# Patient Record
Sex: Female | Born: 1945 | Race: White | Hispanic: No | Marital: Married | Smoking: Never smoker
Health system: Southern US, Community
[De-identification: ages and names within clinical notes are randomized; demographics above are authoritative.]

## PROBLEM LIST (undated history)

## (undated) DIAGNOSIS — Z803 Family history of malignant neoplasm of breast: Secondary | ICD-10-CM

## (undated) DIAGNOSIS — Z808 Family history of malignant neoplasm of other organs or systems: Secondary | ICD-10-CM

## (undated) DIAGNOSIS — C801 Malignant (primary) neoplasm, unspecified: Secondary | ICD-10-CM

## (undated) DIAGNOSIS — I509 Heart failure, unspecified: Secondary | ICD-10-CM

## (undated) DIAGNOSIS — Z800 Family history of malignant neoplasm of digestive organs: Secondary | ICD-10-CM

## (undated) DIAGNOSIS — Z8719 Personal history of other diseases of the digestive system: Secondary | ICD-10-CM

## (undated) DIAGNOSIS — C50919 Malignant neoplasm of unspecified site of unspecified female breast: Secondary | ICD-10-CM

## (undated) DIAGNOSIS — Z923 Personal history of irradiation: Secondary | ICD-10-CM

## (undated) DIAGNOSIS — E039 Hypothyroidism, unspecified: Secondary | ICD-10-CM

## (undated) DIAGNOSIS — K219 Gastro-esophageal reflux disease without esophagitis: Secondary | ICD-10-CM

## (undated) DIAGNOSIS — Z806 Family history of leukemia: Secondary | ICD-10-CM

## (undated) DIAGNOSIS — Z8051 Family history of malignant neoplasm of kidney: Secondary | ICD-10-CM

## (undated) DIAGNOSIS — Z9221 Personal history of antineoplastic chemotherapy: Secondary | ICD-10-CM

## (undated) DIAGNOSIS — Z9889 Other specified postprocedural states: Secondary | ICD-10-CM

## (undated) DIAGNOSIS — K589 Irritable bowel syndrome without diarrhea: Secondary | ICD-10-CM

## (undated) DIAGNOSIS — E785 Hyperlipidemia, unspecified: Secondary | ICD-10-CM

## (undated) DIAGNOSIS — J45909 Unspecified asthma, uncomplicated: Secondary | ICD-10-CM

## (undated) HISTORY — DX: Family history of malignant neoplasm of kidney: Z80.51

## (undated) HISTORY — PX: BREAST SURGERY: SHX581

## (undated) HISTORY — DX: Malignant neoplasm of unspecified site of unspecified female breast: C50.919

## (undated) HISTORY — DX: Family history of malignant neoplasm of breast: Z80.3

## (undated) HISTORY — PX: ABDOMINAL HYSTERECTOMY: SHX81

## (undated) HISTORY — DX: Family history of leukemia: Z80.6

## (undated) HISTORY — DX: Irritable bowel syndrome, unspecified: K58.9

## (undated) HISTORY — PX: TONSILLECTOMY: SUR1361

## (undated) HISTORY — PX: DILATION AND CURETTAGE OF UTERUS: SHX78

## (undated) HISTORY — PX: KNEE ARTHROSCOPY: SUR90

## (undated) HISTORY — DX: Family history of malignant neoplasm of other organs or systems: Z80.8

## (undated) HISTORY — DX: Family history of malignant neoplasm of digestive organs: Z80.0

---

## 1998-03-27 ENCOUNTER — Ambulatory Visit (HOSPITAL_COMMUNITY): Admission: RE | Admit: 1998-03-27 | Discharge: 1998-03-27 | Payer: Self-pay | Admitting: Internal Medicine

## 1998-03-27 ENCOUNTER — Encounter: Payer: Self-pay | Admitting: Internal Medicine

## 1999-11-08 ENCOUNTER — Encounter: Admission: RE | Admit: 1999-11-08 | Discharge: 1999-11-08 | Payer: Self-pay | Admitting: Obstetrics and Gynecology

## 1999-11-08 ENCOUNTER — Encounter: Payer: Self-pay | Admitting: Obstetrics and Gynecology

## 1999-12-30 ENCOUNTER — Other Ambulatory Visit: Admission: RE | Admit: 1999-12-30 | Discharge: 1999-12-30 | Payer: Self-pay | Admitting: Obstetrics and Gynecology

## 2000-08-21 ENCOUNTER — Other Ambulatory Visit: Admission: RE | Admit: 2000-08-21 | Discharge: 2000-08-21 | Payer: Self-pay | Admitting: Obstetrics and Gynecology

## 2001-04-17 ENCOUNTER — Other Ambulatory Visit: Admission: RE | Admit: 2001-04-17 | Discharge: 2001-04-17 | Payer: Self-pay | Admitting: Obstetrics and Gynecology

## 2001-04-24 ENCOUNTER — Encounter: Payer: Self-pay | Admitting: Obstetrics and Gynecology

## 2001-04-24 ENCOUNTER — Encounter: Admission: RE | Admit: 2001-04-24 | Discharge: 2001-04-24 | Payer: Self-pay | Admitting: Obstetrics and Gynecology

## 2001-12-10 ENCOUNTER — Encounter: Admission: RE | Admit: 2001-12-10 | Discharge: 2001-12-10 | Payer: Self-pay | Admitting: Gynecology

## 2001-12-10 ENCOUNTER — Encounter: Payer: Self-pay | Admitting: Gynecology

## 2002-01-21 ENCOUNTER — Other Ambulatory Visit: Admission: RE | Admit: 2002-01-21 | Discharge: 2002-01-21 | Payer: Self-pay | Admitting: Gynecology

## 2002-04-29 ENCOUNTER — Other Ambulatory Visit: Admission: RE | Admit: 2002-04-29 | Discharge: 2002-04-29 | Payer: Self-pay | Admitting: Gynecology

## 2003-04-09 ENCOUNTER — Encounter: Payer: Self-pay | Admitting: Gastroenterology

## 2003-04-09 ENCOUNTER — Ambulatory Visit (HOSPITAL_COMMUNITY): Admission: RE | Admit: 2003-04-09 | Discharge: 2003-04-09 | Payer: Self-pay | Admitting: Gastroenterology

## 2003-08-11 ENCOUNTER — Other Ambulatory Visit: Admission: RE | Admit: 2003-08-11 | Discharge: 2003-08-11 | Payer: Self-pay | Admitting: Gynecology

## 2005-02-21 ENCOUNTER — Other Ambulatory Visit: Admission: RE | Admit: 2005-02-21 | Discharge: 2005-02-21 | Payer: Self-pay | Admitting: Gynecology

## 2006-05-16 ENCOUNTER — Other Ambulatory Visit: Admission: RE | Admit: 2006-05-16 | Discharge: 2006-05-16 | Payer: Self-pay | Admitting: Gynecology

## 2006-06-07 ENCOUNTER — Encounter: Admission: RE | Admit: 2006-06-07 | Discharge: 2006-06-07 | Payer: Self-pay | Admitting: Gynecology

## 2009-01-18 ENCOUNTER — Emergency Department (HOSPITAL_COMMUNITY): Admission: EM | Admit: 2009-01-18 | Discharge: 2009-01-18 | Payer: Self-pay | Admitting: Emergency Medicine

## 2010-01-26 ENCOUNTER — Ambulatory Visit (HOSPITAL_BASED_OUTPATIENT_CLINIC_OR_DEPARTMENT_OTHER): Admission: RE | Admit: 2010-01-26 | Discharge: 2010-01-26 | Payer: Self-pay | Admitting: Orthopaedic Surgery

## 2010-07-04 DIAGNOSIS — M1990 Unspecified osteoarthritis, unspecified site: Secondary | ICD-10-CM

## 2010-07-04 HISTORY — DX: Unspecified osteoarthritis, unspecified site: M19.90

## 2010-07-25 ENCOUNTER — Encounter: Payer: Self-pay | Admitting: Gynecology

## 2010-11-19 NOTE — Op Note (Signed)
NAME:  [name], [name] NO.:  [id number]   MEDICAL RECORD NO.:  [id number]                   PATIENT TYPE:  AMB   LOCATION:  ENDO                                 FACILITY:  [facility]   PHYSICIAN:  Danise Edge, M.D.                DATE OF BIRTH:  [birth year]   DATE OF PROCEDURE:  04/09/2003  DATE OF DISCHARGE:                                 OPERATIVE REPORT   PROCEDURE:  Incomplete colonoscopy.   INDICATIONS FOR PROCEDURE:  [name] is a 65 year old female born  11-Mar-1946. [name] probably has irritable bowel syndrome (diarrhea  predominant). Until she started taking hyoscyamine, she would experience  postprandial abdominal cramps bordering nonbloody diarrhea. Occasionally she  would see fresh blood on the toilet tissue after a bowel movement. Her  father has undergone colonoscopic exams to remove noncancerous colon polyps.   February 05, 2003 stool screen for C difficile toxin negative. Giardia-  crypto__________ screening negative. Ova and parasite screen negative. Fecal  leukocyte screen negative.   Since starting hyoscyamine, she has converted back to having her usual one  bowel movement daily.   CURRENT MEDICATIONS:  1. Nasacort.  2. Allegra.  3. __________.   PAST MEDICAL HISTORY:  1. Total abdominal hysterectomy.  2. Appendectomy.  3. Allergic rhinitis.   ALLERGIES:  PENICILLIN.   FAMILY HISTORY:  Father has undergone colonoscopic exams to remove  noncancerous colon polyps.   ENDOSCOPIST:  Danise Edge, M.D.   PREMEDICATION:  Versed 10 mg, Demerol 50 mg.   DESCRIPTION OF PROCEDURE:  After obtaining informed consent, Julie Thompson  was  placed in the left lateral decubitus position. I administered intravenous  Demerol and intravenous Versed to achieve conscious sedation for the  procedure. The patient's blood pressure, oxygen saturation and cardiac  rhythm were monitored throughout the procedure and documented in  the medical  record.   Anal inspection was normal. Digital rectal exam was normal. The Olympus  adult  colonoscope was introduced into the rectum and advanced to only 20 cm  from the anal verge. Due to colonic loop formation, I was unable to advance  the endoscope any further. I then switched to the pediatric adjustable  colonoscope and was able to advance the scope to 35 cm from the anal verge.  Due to colonic loop formation, I was unable to complete a colonoscopy.   Endoscopic appearance of the rectum and sigmoid colon with the endoscope  advanced to 35 cm from the anal verge was completely normal. There is no  endoscopic evidence for the presence of bleeding, inflammatory bowel disease  or colorectal neoplasia.   PLAN:  I am referring Julie Thompson to the radiology suite for an air contrast  barium enema.  Danise Edge, M.D.    MJ/MEDQ  D:  04/09/2003  T:  04/09/2003  Job:  [id number]   cc:   Theressa Millard, M.D.  [address] [place]  [place] [address]  Fax: [phone]

## 2011-03-01 ENCOUNTER — Other Ambulatory Visit: Payer: Self-pay | Admitting: Gynecology

## 2011-03-01 DIAGNOSIS — Z1231 Encounter for screening mammogram for malignant neoplasm of breast: Secondary | ICD-10-CM

## 2011-03-04 ENCOUNTER — Ambulatory Visit: Payer: Self-pay

## 2011-03-09 ENCOUNTER — Ambulatory Visit
Admission: RE | Admit: 2011-03-09 | Discharge: 2011-03-09 | Disposition: A | Discharge status: Home / Self Care | Payer: BC Managed Care – PPO | Source: Ambulatory Visit | Attending: Gynecology | Admitting: Gynecology

## 2011-03-09 DIAGNOSIS — Z1231 Encounter for screening mammogram for malignant neoplasm of breast: Secondary | ICD-10-CM

## 2013-09-02 ENCOUNTER — Other Ambulatory Visit: Payer: Self-pay | Admitting: Gastroenterology

## 2013-09-30 ENCOUNTER — Encounter (HOSPITAL_COMMUNITY): Payer: Self-pay | Admitting: Pharmacy Technician

## 2013-09-30 ENCOUNTER — Encounter (HOSPITAL_COMMUNITY): Payer: Self-pay | Admitting: *Deleted

## 2013-10-22 ENCOUNTER — Encounter (HOSPITAL_COMMUNITY): Payer: Self-pay | Admitting: *Deleted

## 2013-10-22 ENCOUNTER — Encounter (HOSPITAL_COMMUNITY)
Admission: RE | Disposition: A | Discharge status: Home / Self Care | Payer: Self-pay | Source: Ambulatory Visit | Attending: Gastroenterology

## 2013-10-22 ENCOUNTER — Ambulatory Visit (HOSPITAL_COMMUNITY)
Admission: RE | Admit: 2013-10-22 | Discharge: 2013-10-22 | Disposition: A | Discharge status: Home / Self Care | Payer: Medicare Other | Source: Ambulatory Visit | Attending: Gastroenterology | Admitting: Gastroenterology

## 2013-10-22 ENCOUNTER — Ambulatory Visit (HOSPITAL_COMMUNITY): Payer: Medicare Other | Admitting: Anesthesiology

## 2013-10-22 ENCOUNTER — Encounter (HOSPITAL_COMMUNITY): Payer: Medicare Other | Admitting: Anesthesiology

## 2013-10-22 DIAGNOSIS — N736 Female pelvic peritoneal adhesions (postinfective): Secondary | ICD-10-CM | POA: Insufficient documentation

## 2013-10-22 DIAGNOSIS — Z1211 Encounter for screening for malignant neoplasm of colon: Secondary | ICD-10-CM | POA: Insufficient documentation

## 2013-10-22 DIAGNOSIS — Z90710 Acquired absence of both cervix and uterus: Secondary | ICD-10-CM | POA: Insufficient documentation

## 2013-10-22 DIAGNOSIS — K5730 Diverticulosis of large intestine without perforation or abscess without bleeding: Secondary | ICD-10-CM | POA: Insufficient documentation

## 2013-10-22 HISTORY — DX: Unspecified asthma, uncomplicated: J45.909

## 2013-10-22 HISTORY — PX: FLEXIBLE SIGMOIDOSCOPY: SHX5431

## 2013-10-22 HISTORY — DX: Personal history of other diseases of the digestive system: Z87.19

## 2013-10-22 HISTORY — DX: Gastro-esophageal reflux disease without esophagitis: K21.9

## 2013-10-22 SURGERY — SIGMOIDOSCOPY, FLEXIBLE
Anesthesia: Monitor Anesthesia Care

## 2013-10-22 MED ORDER — PROPOFOL INFUSION 10 MG/ML OPTIME
INTRAVENOUS | Status: DC | PRN
  Administered 2013-10-22: 120 ug/kg/min via INTRAVENOUS

## 2013-10-22 MED ORDER — PROMETHAZINE HCL 25 MG/ML IJ SOLN: 6.2500 mg | INTRAMUSCULAR | Status: DC | PRN

## 2013-10-22 MED ORDER — PROPOFOL 10 MG/ML IV BOLUS
INTRAVENOUS | Status: DC | PRN
  Administered 2013-10-22: 50 mg via INTRAVENOUS

## 2013-10-22 MED ORDER — PROPOFOL 10 MG/ML IV BOLUS
INTRAVENOUS | Status: AC
  Filled 2013-10-22: qty 20

## 2013-10-22 MED ORDER — LIDOCAINE HCL (CARDIAC) 20 MG/ML IV SOLN
INTRAVENOUS | Status: DC | PRN
  Administered 2013-10-22: 50 mg via INTRAVENOUS

## 2013-10-22 MED ORDER — LIDOCAINE HCL (CARDIAC) 20 MG/ML IV SOLN
INTRAVENOUS | Status: AC
  Filled 2013-10-22: qty 5

## 2013-10-22 MED ORDER — LACTATED RINGERS IV SOLN
INTRAVENOUS | Status: DC
  Administered 2013-10-22: 1000 mL via INTRAVENOUS

## 2013-10-22 SURGICAL SUPPLY — 21 items

## 2013-10-22 NOTE — Discharge Instructions (Signed)
Flexible Sigmoidoscopy °Your caregiver has ordered a flexible sigmoidoscopy. This is an exam to evaluate your lower colon. In this exam your colon is cleansed and a short fiber optic tube is inserted through your rectum and into your colon. The fiber optic scope (endoscope) is a short bundle of enclosed flexible small glass fibers. It transmits light to the area examined and images from that area to your caregiver. You do not have to worry about glass breakage in the endoscope. Discomfort is usually minimal. Sedatives and pain medications are generally not required. This exam helps to detect tumors (lumps), polyps, inflammation (swelling and soreness), and areas of bleeding. It may also be used to take biopsies. These are small pieces of tissue taken to examine under a microscope. °LET YOUR CAREGIVER KNOW ABOUT: °· Allergies. °· Medications taken including herbs, eye drops, over the counter medications, and creams. °· Use of steroids (by mouth or creams). °· Previous problems with anesthetics or novocaine °· Possibility of pregnancy, if this applies. °· History of blood clots (thrombophlebitis). °· History of bleeding or blood problems. °· Previous surgery. °· Other health problems. °BEFORE THE PROCEDURE °Eat normally the night before the exam. Your caregiver may order a mild enema or laxative the night before. No eating or drinking should occur after midnight until the procedure is completed. A rectal suppository or enemas may be given in the morning prior to your procedure. You will be brought to the examination area in a hospital gown. °You should be present 60 minutes prior to your procedure or as directed.  °AFTER THE PROCEDURE  °· There is sometimes a little blood passed with the first bowel movement. Do not be concerned. Because air is often used during the exam, it is not unusual to pass gas and experience abdominal (belly) cramping. Walking or a warm pack on your abdomen may help with this. Do not sleep  with a heating pad as burns can occur. °· You may resume all normal eating and activities. °· Only take over-the-counter or prescription medicines for pain, discomfort, or fever as directed by your caregiver. Do not use aspirin or blood thinners if a biopsy (tissue sample) was taken. Consult your caregiver for medication usage if biopsies were taken. °· Call for your results as instructed by your caregiver. Remember, it is your responsibility to obtain the results of your biopsy. Do not assume everything is fine because you do not hear from your caregiver. °SEEK IMMEDIATE MEDICAL CARE IF: °· An oral temperature above 102° F (38.9° C) develops. °· You pass large blood clots or fill a toilet with blood following the procedure. This may also occur 10 to 14 days following the procedure. It is more likely if a biopsy was taken. °· You develop abdominal pain not relieved with medication or that is getting worse rather than better. °Document Released: 06/17/2000 Document Revised: 09/12/2011 Document Reviewed: 03/30/2005 °[employer]® Patient Information ©2014 [employer]. ° °

## 2013-10-22 NOTE — Op Note (Signed)
Procedure: Screening flexible proctosigmoidoscopy. A screening colonoscopy could not be performed due to pelvic adhesions from previous surgery resulting in a poorly mobile sigmoid colon.  Endoscopist: Earle Gell  Premedication: Propofol administered by anesthesia  Procedure: The patient was placed in the left lateral decubitus position. Anal inspection and digital rectal exam were normal. The Pentax pediatric colonoscope was introduced into the rectum and advanced to approximately 30 cm from the anal verge. I could not advance the colonoscope proximal due to restricted mobility of the sigmoid colon from previous surgery. Pediatric colonoscope was removed and the ultrathin colonoscope was introduced into the rectum and advanced to 30 cm from the anal verge. I was unable to advance the ultrathin colonoscope proximally due to restricted mobility of the sigmoid colon from previous surgery. Colonic preparation for the exam today was good.  Rectum. Normal. Retroflexed view of the distal rectum normal  Sigmoid colon. Extensive colonic diverticulosis  Assessment:   #1. A screening colonoscopy could not be performed using the pediatric colonoscope and ultrathin colonoscope secondary to restricted sigmoid colon mobility from previous surgery  #2. Extensive left colonic diverticulosis  Recommendations: I recommend scheduling the patient for a screening virtual colonoscopy.

## 2013-10-22 NOTE — H&P (Signed)
  Procedure: Screening colonoscopy. 04/09/2003 normal screening flexible proctosigmoidoscopy and air-contrast barium enema  History: The patient is a 68 year old female born 1945/07/11. She is scheduled to undergo a screening colonoscopy with polypectomy to prevent colon cancer.  Past medical history: Endometriosis. Hysterectomy. Arthroscopic knee surgery.  Allergies: Penicillin and sulfa drugs  Exam: The patient is alert and lying comfortably on the endoscopy stretcher. Lungs are clear to auscultation. Cardiac exam reveals a regular rhythm. Abdomen is soft and nontender to palpation.   Plan: Proceed with screening colonoscopy

## 2013-10-22 NOTE — Anesthesia Postprocedure Evaluation (Signed)
  Anesthesia Post-op Note  Patient: Julie Thompson  Procedure(s) Performed: Procedure(s) (LRB): FLEXIBLE SIGMOIDOSCOPY (N/A)  Patient Location: PACU  Anesthesia Type: MAC  Level of Consciousness: awake and alert   Airway and Oxygen Therapy: Patient Spontanous Breathing  Post-op Pain: mild  Post-op Assessment: Post-op Vital signs reviewed, Patient's Cardiovascular Status Stable, Respiratory Function Stable, Patent Airway and No signs of Nausea or vomiting  Last Vitals:  Filed Vitals:   10/22/13 1105  BP: 138/83  Temp:   Resp: 23    Post-op Vital Signs: stable   Complications: No apparent anesthesia complications

## 2013-10-22 NOTE — Anesthesia Preprocedure Evaluation (Signed)
Anesthesia Evaluation  Patient identified by MRN, date of birth, ID band Patient awake    Reviewed: Allergy & Precautions, H&P , NPO status , Patient's Chart, lab work & pertinent test results  Airway Mallampati: II TM Distance: >3 FB Neck ROM: Full    Dental no notable dental hx.    Pulmonary asthma ,  breath sounds clear to auscultation  Pulmonary exam normal       Cardiovascular negative cardio ROS  Rhythm:Regular Rate:Normal     Neuro/Psych negative neurological ROS  negative psych ROS   GI/Hepatic negative GI ROS, Neg liver ROS,   Endo/Other  negative endocrine ROS  Renal/GU negative Renal ROS  negative genitourinary   Musculoskeletal negative musculoskeletal ROS (+)   Abdominal   Peds negative pediatric ROS (+)  Hematology negative hematology ROS (+)   Anesthesia Other Findings   Reproductive/Obstetrics negative OB ROS                           Anesthesia Physical Anesthesia Plan  ASA: II  Anesthesia Plan: MAC   Post-op Pain Management:    Induction: Intravenous  Airway Management Planned: Nasal Cannula  Additional Equipment:   Intra-op Plan:   Post-operative Plan:   Informed Consent: I have reviewed the patients History and Physical, chart, labs and discussed the procedure including the risks, benefits and alternatives for the proposed anesthesia with the patient or authorized representative who has indicated his/her understanding and acceptance.   Dental advisory given  Plan Discussed with: CRNA and Surgeon  Anesthesia Plan Comments:         Anesthesia Quick Evaluation

## 2013-10-22 NOTE — Transfer of Care (Signed)
Immediate Anesthesia Transfer of Care Note  Patient: Julie Thompson  Procedure(s) Performed: Procedure(s): FLEXIBLE SIGMOIDOSCOPY (N/A)  Patient Location: PACU  Anesthesia Type:MAC  Level of Consciousness: awake, alert  and oriented  Airway & Oxygen Therapy: Patient Spontanous Breathing and Patient connected to face mask oxygen  Post-op Assessment: Report given to PACU RN and Post -op Vital signs reviewed and stable  Post vital signs: Reviewed and stable  Complications: No apparent anesthesia complications

## 2013-10-23 ENCOUNTER — Encounter (HOSPITAL_COMMUNITY): Payer: Self-pay | Admitting: Gastroenterology

## 2013-10-29 ENCOUNTER — Other Ambulatory Visit: Payer: Self-pay | Admitting: Gastroenterology

## 2013-10-29 DIAGNOSIS — Q438 Other specified congenital malformations of intestine: Secondary | ICD-10-CM

## 2013-11-11 ENCOUNTER — Other Ambulatory Visit: Payer: Medicare Other

## 2013-11-15 ENCOUNTER — Ambulatory Visit
Admission: RE | Admit: 2013-11-15 | Discharge: 2013-11-15 | Disposition: A | Discharge status: Home / Self Care | Payer: Medicare Other | Source: Ambulatory Visit | Attending: Gastroenterology | Admitting: Gastroenterology

## 2013-11-15 DIAGNOSIS — Q438 Other specified congenital malformations of intestine: Secondary | ICD-10-CM

## 2014-02-18 ENCOUNTER — Other Ambulatory Visit: Payer: Self-pay

## 2014-02-18 DIAGNOSIS — Z1231 Encounter for screening mammogram for malignant neoplasm of breast: Secondary | ICD-10-CM

## 2014-02-24 ENCOUNTER — Ambulatory Visit: Payer: Medicare Other

## 2014-03-03 ENCOUNTER — Ambulatory Visit
Admission: RE | Admit: 2014-03-03 | Discharge: 2014-03-03 | Disposition: A | Discharge status: Home / Self Care | Payer: Medicare Other | Source: Ambulatory Visit

## 2014-03-03 DIAGNOSIS — Z1231 Encounter for screening mammogram for malignant neoplasm of breast: Secondary | ICD-10-CM

## 2015-10-09 DIAGNOSIS — H25099 Other age-related incipient cataract, unspecified eye: Secondary | ICD-10-CM | POA: Diagnosis not present

## 2015-10-21 DIAGNOSIS — M1712 Unilateral primary osteoarthritis, left knee: Secondary | ICD-10-CM | POA: Diagnosis not present

## 2015-10-21 DIAGNOSIS — M1711 Unilateral primary osteoarthritis, right knee: Secondary | ICD-10-CM | POA: Diagnosis not present

## 2015-11-20 DIAGNOSIS — M1712 Unilateral primary osteoarthritis, left knee: Secondary | ICD-10-CM | POA: Diagnosis not present

## 2015-11-20 DIAGNOSIS — M1711 Unilateral primary osteoarthritis, right knee: Secondary | ICD-10-CM | POA: Diagnosis not present

## 2015-11-27 DIAGNOSIS — M1712 Unilateral primary osteoarthritis, left knee: Secondary | ICD-10-CM | POA: Diagnosis not present

## 2015-11-27 DIAGNOSIS — M1711 Unilateral primary osteoarthritis, right knee: Secondary | ICD-10-CM | POA: Diagnosis not present

## 2015-12-04 DIAGNOSIS — M1712 Unilateral primary osteoarthritis, left knee: Secondary | ICD-10-CM | POA: Diagnosis not present

## 2015-12-04 DIAGNOSIS — M1711 Unilateral primary osteoarthritis, right knee: Secondary | ICD-10-CM | POA: Diagnosis not present

## 2015-12-11 DIAGNOSIS — M1711 Unilateral primary osteoarthritis, right knee: Secondary | ICD-10-CM | POA: Diagnosis not present

## 2015-12-11 DIAGNOSIS — M1712 Unilateral primary osteoarthritis, left knee: Secondary | ICD-10-CM | POA: Diagnosis not present

## 2015-12-18 DIAGNOSIS — M1712 Unilateral primary osteoarthritis, left knee: Secondary | ICD-10-CM | POA: Diagnosis not present

## 2015-12-18 DIAGNOSIS — M1711 Unilateral primary osteoarthritis, right knee: Secondary | ICD-10-CM | POA: Diagnosis not present

## 2016-01-18 DIAGNOSIS — M1711 Unilateral primary osteoarthritis, right knee: Secondary | ICD-10-CM | POA: Diagnosis not present

## 2016-02-17 DIAGNOSIS — M1711 Unilateral primary osteoarthritis, right knee: Secondary | ICD-10-CM | POA: Diagnosis not present

## 2016-02-17 DIAGNOSIS — M1712 Unilateral primary osteoarthritis, left knee: Secondary | ICD-10-CM | POA: Diagnosis not present

## 2016-03-02 DIAGNOSIS — Z1239 Encounter for other screening for malignant neoplasm of breast: Secondary | ICD-10-CM | POA: Diagnosis not present

## 2016-03-02 DIAGNOSIS — Z6838 Body mass index (BMI) 38.0-38.9, adult: Secondary | ICD-10-CM | POA: Diagnosis not present

## 2016-03-02 DIAGNOSIS — K210 Gastro-esophageal reflux disease with esophagitis: Secondary | ICD-10-CM | POA: Diagnosis not present

## 2016-03-02 DIAGNOSIS — E785 Hyperlipidemia, unspecified: Secondary | ICD-10-CM | POA: Diagnosis not present

## 2016-03-02 DIAGNOSIS — K589 Irritable bowel syndrome without diarrhea: Secondary | ICD-10-CM | POA: Diagnosis not present

## 2016-03-02 DIAGNOSIS — R635 Abnormal weight gain: Secondary | ICD-10-CM | POA: Diagnosis not present

## 2016-03-02 DIAGNOSIS — Z1389 Encounter for screening for other disorder: Secondary | ICD-10-CM | POA: Diagnosis not present

## 2016-03-02 DIAGNOSIS — Z1211 Encounter for screening for malignant neoplasm of colon: Secondary | ICD-10-CM | POA: Diagnosis not present

## 2016-03-02 DIAGNOSIS — N3941 Urge incontinence: Secondary | ICD-10-CM | POA: Diagnosis not present

## 2016-03-02 DIAGNOSIS — Z0000 Encounter for general adult medical examination without abnormal findings: Secondary | ICD-10-CM | POA: Diagnosis not present

## 2016-05-03 DIAGNOSIS — Z23 Encounter for immunization: Secondary | ICD-10-CM | POA: Diagnosis not present

## 2016-05-03 DIAGNOSIS — E7800 Pure hypercholesterolemia, unspecified: Secondary | ICD-10-CM | POA: Diagnosis not present

## 2016-05-03 DIAGNOSIS — E039 Hypothyroidism, unspecified: Secondary | ICD-10-CM | POA: Diagnosis not present

## 2016-08-20 DIAGNOSIS — M1711 Unilateral primary osteoarthritis, right knee: Secondary | ICD-10-CM | POA: Diagnosis not present

## 2016-08-20 DIAGNOSIS — M1712 Unilateral primary osteoarthritis, left knee: Secondary | ICD-10-CM | POA: Diagnosis not present

## 2016-08-20 DIAGNOSIS — S76312A Strain of muscle, fascia and tendon of the posterior muscle group at thigh level, left thigh, initial encounter: Secondary | ICD-10-CM | POA: Diagnosis not present

## 2016-08-29 DIAGNOSIS — Z780 Asymptomatic menopausal state: Secondary | ICD-10-CM | POA: Diagnosis not present

## 2016-08-31 DIAGNOSIS — E039 Hypothyroidism, unspecified: Secondary | ICD-10-CM | POA: Diagnosis not present

## 2016-08-31 DIAGNOSIS — M1712 Unilateral primary osteoarthritis, left knee: Secondary | ICD-10-CM | POA: Diagnosis not present

## 2016-08-31 DIAGNOSIS — M1711 Unilateral primary osteoarthritis, right knee: Secondary | ICD-10-CM | POA: Diagnosis not present

## 2016-08-31 DIAGNOSIS — E7800 Pure hypercholesterolemia, unspecified: Secondary | ICD-10-CM | POA: Diagnosis not present

## 2016-08-31 DIAGNOSIS — M170 Bilateral primary osteoarthritis of knee: Secondary | ICD-10-CM | POA: Diagnosis not present

## 2016-09-07 DIAGNOSIS — M1712 Unilateral primary osteoarthritis, left knee: Secondary | ICD-10-CM | POA: Diagnosis not present

## 2016-09-07 DIAGNOSIS — M1711 Unilateral primary osteoarthritis, right knee: Secondary | ICD-10-CM | POA: Diagnosis not present

## 2016-09-14 DIAGNOSIS — M1712 Unilateral primary osteoarthritis, left knee: Secondary | ICD-10-CM | POA: Diagnosis not present

## 2016-09-14 DIAGNOSIS — M1711 Unilateral primary osteoarthritis, right knee: Secondary | ICD-10-CM | POA: Diagnosis not present

## 2016-09-21 DIAGNOSIS — M1712 Unilateral primary osteoarthritis, left knee: Secondary | ICD-10-CM | POA: Diagnosis not present

## 2016-09-21 DIAGNOSIS — M1711 Unilateral primary osteoarthritis, right knee: Secondary | ICD-10-CM | POA: Diagnosis not present

## 2016-09-28 DIAGNOSIS — M1711 Unilateral primary osteoarthritis, right knee: Secondary | ICD-10-CM | POA: Diagnosis not present

## 2016-09-28 DIAGNOSIS — M1712 Unilateral primary osteoarthritis, left knee: Secondary | ICD-10-CM | POA: Diagnosis not present

## 2016-10-26 DIAGNOSIS — M170 Bilateral primary osteoarthritis of knee: Secondary | ICD-10-CM | POA: Diagnosis not present

## 2016-12-13 ENCOUNTER — Other Ambulatory Visit: Payer: Self-pay | Admitting: Internal Medicine

## 2016-12-13 DIAGNOSIS — Z1231 Encounter for screening mammogram for malignant neoplasm of breast: Secondary | ICD-10-CM

## 2016-12-28 ENCOUNTER — Ambulatory Visit
Admission: RE | Admit: 2016-12-28 | Discharge: 2016-12-28 | Disposition: A | Discharge status: Home / Self Care | Payer: Medicare Other | Source: Ambulatory Visit | Attending: Internal Medicine | Admitting: Internal Medicine

## 2016-12-28 DIAGNOSIS — Z1231 Encounter for screening mammogram for malignant neoplasm of breast: Secondary | ICD-10-CM

## 2017-03-03 DIAGNOSIS — E039 Hypothyroidism, unspecified: Secondary | ICD-10-CM | POA: Diagnosis not present

## 2017-03-03 DIAGNOSIS — Z0000 Encounter for general adult medical examination without abnormal findings: Secondary | ICD-10-CM | POA: Diagnosis not present

## 2017-03-03 DIAGNOSIS — E7800 Pure hypercholesterolemia, unspecified: Secondary | ICD-10-CM | POA: Diagnosis not present

## 2017-03-03 DIAGNOSIS — M170 Bilateral primary osteoarthritis of knee: Secondary | ICD-10-CM | POA: Diagnosis not present

## 2017-03-27 DIAGNOSIS — M1712 Unilateral primary osteoarthritis, left knee: Secondary | ICD-10-CM | POA: Diagnosis not present

## 2017-03-27 DIAGNOSIS — M1711 Unilateral primary osteoarthritis, right knee: Secondary | ICD-10-CM | POA: Diagnosis not present

## 2017-04-10 DIAGNOSIS — M1711 Unilateral primary osteoarthritis, right knee: Secondary | ICD-10-CM | POA: Diagnosis not present

## 2017-04-10 DIAGNOSIS — M1712 Unilateral primary osteoarthritis, left knee: Secondary | ICD-10-CM | POA: Diagnosis not present

## 2017-04-12 DIAGNOSIS — Z23 Encounter for immunization: Secondary | ICD-10-CM | POA: Diagnosis not present

## 2017-04-19 DIAGNOSIS — M1711 Unilateral primary osteoarthritis, right knee: Secondary | ICD-10-CM | POA: Diagnosis not present

## 2017-04-19 DIAGNOSIS — M1712 Unilateral primary osteoarthritis, left knee: Secondary | ICD-10-CM | POA: Diagnosis not present

## 2017-04-26 DIAGNOSIS — M1712 Unilateral primary osteoarthritis, left knee: Secondary | ICD-10-CM | POA: Diagnosis not present

## 2017-04-26 DIAGNOSIS — M1711 Unilateral primary osteoarthritis, right knee: Secondary | ICD-10-CM | POA: Diagnosis not present

## 2017-08-20 DIAGNOSIS — B349 Viral infection, unspecified: Secondary | ICD-10-CM | POA: Diagnosis not present

## 2017-08-20 DIAGNOSIS — J0100 Acute maxillary sinusitis, unspecified: Secondary | ICD-10-CM | POA: Diagnosis not present

## 2017-09-05 DIAGNOSIS — M170 Bilateral primary osteoarthritis of knee: Secondary | ICD-10-CM | POA: Diagnosis not present

## 2017-09-05 DIAGNOSIS — E039 Hypothyroidism, unspecified: Secondary | ICD-10-CM | POA: Diagnosis not present

## 2017-10-02 DIAGNOSIS — E039 Hypothyroidism, unspecified: Secondary | ICD-10-CM | POA: Diagnosis not present

## 2017-10-02 DIAGNOSIS — R143 Flatulence: Secondary | ICD-10-CM | POA: Diagnosis not present

## 2017-10-02 DIAGNOSIS — E7800 Pure hypercholesterolemia, unspecified: Secondary | ICD-10-CM | POA: Diagnosis not present

## 2017-11-06 DIAGNOSIS — H5203 Hypermetropia, bilateral: Secondary | ICD-10-CM | POA: Diagnosis not present

## 2017-11-15 DIAGNOSIS — E039 Hypothyroidism, unspecified: Secondary | ICD-10-CM | POA: Diagnosis not present

## 2017-11-15 DIAGNOSIS — E7800 Pure hypercholesterolemia, unspecified: Secondary | ICD-10-CM | POA: Diagnosis not present

## 2017-11-15 DIAGNOSIS — M170 Bilateral primary osteoarthritis of knee: Secondary | ICD-10-CM | POA: Diagnosis not present

## 2017-12-20 DIAGNOSIS — M1711 Unilateral primary osteoarthritis, right knee: Secondary | ICD-10-CM | POA: Diagnosis not present

## 2017-12-20 DIAGNOSIS — M1712 Unilateral primary osteoarthritis, left knee: Secondary | ICD-10-CM | POA: Diagnosis not present

## 2017-12-27 DIAGNOSIS — M1712 Unilateral primary osteoarthritis, left knee: Secondary | ICD-10-CM | POA: Diagnosis not present

## 2017-12-27 DIAGNOSIS — M1711 Unilateral primary osteoarthritis, right knee: Secondary | ICD-10-CM | POA: Diagnosis not present

## 2018-01-03 DIAGNOSIS — M1712 Unilateral primary osteoarthritis, left knee: Secondary | ICD-10-CM | POA: Diagnosis not present

## 2018-01-03 DIAGNOSIS — M1711 Unilateral primary osteoarthritis, right knee: Secondary | ICD-10-CM | POA: Diagnosis not present

## 2018-03-22 DIAGNOSIS — E039 Hypothyroidism, unspecified: Secondary | ICD-10-CM | POA: Diagnosis not present

## 2018-03-22 DIAGNOSIS — Z0000 Encounter for general adult medical examination without abnormal findings: Secondary | ICD-10-CM | POA: Diagnosis not present

## 2018-03-22 DIAGNOSIS — M170 Bilateral primary osteoarthritis of knee: Secondary | ICD-10-CM | POA: Diagnosis not present

## 2018-03-22 DIAGNOSIS — E7800 Pure hypercholesterolemia, unspecified: Secondary | ICD-10-CM | POA: Diagnosis not present

## 2018-03-22 DIAGNOSIS — Z1159 Encounter for screening for other viral diseases: Secondary | ICD-10-CM | POA: Diagnosis not present

## 2018-04-09 DIAGNOSIS — M1712 Unilateral primary osteoarthritis, left knee: Secondary | ICD-10-CM | POA: Diagnosis not present

## 2018-04-09 DIAGNOSIS — M1711 Unilateral primary osteoarthritis, right knee: Secondary | ICD-10-CM | POA: Diagnosis not present

## 2018-05-07 DIAGNOSIS — M1712 Unilateral primary osteoarthritis, left knee: Secondary | ICD-10-CM | POA: Diagnosis not present

## 2018-05-07 DIAGNOSIS — M1711 Unilateral primary osteoarthritis, right knee: Secondary | ICD-10-CM | POA: Diagnosis not present

## 2018-05-08 DIAGNOSIS — M170 Bilateral primary osteoarthritis of knee: Secondary | ICD-10-CM | POA: Diagnosis not present

## 2018-05-11 DIAGNOSIS — M170 Bilateral primary osteoarthritis of knee: Secondary | ICD-10-CM | POA: Diagnosis not present

## 2018-05-14 DIAGNOSIS — M170 Bilateral primary osteoarthritis of knee: Secondary | ICD-10-CM | POA: Diagnosis not present

## 2018-05-16 DIAGNOSIS — M170 Bilateral primary osteoarthritis of knee: Secondary | ICD-10-CM | POA: Diagnosis not present

## 2018-05-21 DIAGNOSIS — M170 Bilateral primary osteoarthritis of knee: Secondary | ICD-10-CM | POA: Diagnosis not present

## 2018-05-23 DIAGNOSIS — M170 Bilateral primary osteoarthritis of knee: Secondary | ICD-10-CM | POA: Diagnosis not present

## 2018-05-28 DIAGNOSIS — M170 Bilateral primary osteoarthritis of knee: Secondary | ICD-10-CM | POA: Diagnosis not present

## 2018-08-25 DIAGNOSIS — M170 Bilateral primary osteoarthritis of knee: Secondary | ICD-10-CM | POA: Diagnosis not present

## 2018-11-07 DIAGNOSIS — M170 Bilateral primary osteoarthritis of knee: Secondary | ICD-10-CM | POA: Diagnosis not present

## 2018-11-13 DIAGNOSIS — M170 Bilateral primary osteoarthritis of knee: Secondary | ICD-10-CM | POA: Diagnosis not present

## 2018-11-13 DIAGNOSIS — E7800 Pure hypercholesterolemia, unspecified: Secondary | ICD-10-CM | POA: Diagnosis not present

## 2018-11-13 DIAGNOSIS — E039 Hypothyroidism, unspecified: Secondary | ICD-10-CM | POA: Diagnosis not present

## 2018-11-13 DIAGNOSIS — K210 Gastro-esophageal reflux disease with esophagitis: Secondary | ICD-10-CM | POA: Diagnosis not present

## 2018-12-19 DIAGNOSIS — M170 Bilateral primary osteoarthritis of knee: Secondary | ICD-10-CM | POA: Diagnosis not present

## 2018-12-31 DIAGNOSIS — M170 Bilateral primary osteoarthritis of knee: Secondary | ICD-10-CM | POA: Diagnosis not present

## 2019-01-07 DIAGNOSIS — M170 Bilateral primary osteoarthritis of knee: Secondary | ICD-10-CM | POA: Diagnosis not present

## 2019-03-30 DIAGNOSIS — H8110 Benign paroxysmal vertigo, unspecified ear: Secondary | ICD-10-CM | POA: Diagnosis not present

## 2019-05-01 ENCOUNTER — Other Ambulatory Visit: Payer: Self-pay | Admitting: Internal Medicine

## 2019-05-01 DIAGNOSIS — M170 Bilateral primary osteoarthritis of knee: Secondary | ICD-10-CM | POA: Diagnosis not present

## 2019-05-01 DIAGNOSIS — Z0000 Encounter for general adult medical examination without abnormal findings: Secondary | ICD-10-CM | POA: Diagnosis not present

## 2019-05-01 DIAGNOSIS — Z1231 Encounter for screening mammogram for malignant neoplasm of breast: Secondary | ICD-10-CM

## 2019-05-01 DIAGNOSIS — E7800 Pure hypercholesterolemia, unspecified: Secondary | ICD-10-CM | POA: Diagnosis not present

## 2019-05-01 DIAGNOSIS — E039 Hypothyroidism, unspecified: Secondary | ICD-10-CM | POA: Diagnosis not present

## 2019-05-01 DIAGNOSIS — Z0001 Encounter for general adult medical examination with abnormal findings: Secondary | ICD-10-CM | POA: Diagnosis not present

## 2019-05-01 DIAGNOSIS — Z1389 Encounter for screening for other disorder: Secondary | ICD-10-CM | POA: Diagnosis not present

## 2019-05-01 DIAGNOSIS — Z23 Encounter for immunization: Secondary | ICD-10-CM | POA: Diagnosis not present

## 2019-06-21 ENCOUNTER — Ambulatory Visit
Admission: RE | Admit: 2019-06-21 | Discharge: 2019-06-21 | Disposition: A | Discharge status: Home / Self Care | Payer: Medicare Other | Source: Ambulatory Visit | Attending: Internal Medicine | Admitting: Internal Medicine

## 2019-06-21 ENCOUNTER — Other Ambulatory Visit: Payer: Self-pay

## 2019-06-21 DIAGNOSIS — Z1231 Encounter for screening mammogram for malignant neoplasm of breast: Secondary | ICD-10-CM

## 2019-06-24 ENCOUNTER — Other Ambulatory Visit: Payer: Self-pay | Admitting: Internal Medicine

## 2019-06-24 DIAGNOSIS — R928 Other abnormal and inconclusive findings on diagnostic imaging of breast: Secondary | ICD-10-CM

## 2019-06-26 ENCOUNTER — Other Ambulatory Visit: Payer: Self-pay | Admitting: Internal Medicine

## 2019-07-01 ENCOUNTER — Other Ambulatory Visit: Payer: Self-pay

## 2019-07-01 ENCOUNTER — Ambulatory Visit
Admission: RE | Admit: 2019-07-01 | Discharge: 2019-07-01 | Disposition: A | Discharge status: Home / Self Care | Payer: Medicare Other | Source: Ambulatory Visit | Attending: Internal Medicine | Admitting: Internal Medicine

## 2019-07-01 ENCOUNTER — Other Ambulatory Visit: Payer: Self-pay | Admitting: Internal Medicine

## 2019-07-01 DIAGNOSIS — R922 Inconclusive mammogram: Secondary | ICD-10-CM | POA: Diagnosis not present

## 2019-07-01 DIAGNOSIS — N6324 Unspecified lump in the left breast, lower inner quadrant: Secondary | ICD-10-CM | POA: Diagnosis not present

## 2019-07-01 DIAGNOSIS — R928 Other abnormal and inconclusive findings on diagnostic imaging of breast: Secondary | ICD-10-CM

## 2019-07-01 DIAGNOSIS — N6320 Unspecified lump in the left breast, unspecified quadrant: Secondary | ICD-10-CM

## 2019-07-01 DIAGNOSIS — R921 Mammographic calcification found on diagnostic imaging of breast: Secondary | ICD-10-CM | POA: Diagnosis not present

## 2019-07-03 DIAGNOSIS — E7800 Pure hypercholesterolemia, unspecified: Secondary | ICD-10-CM | POA: Diagnosis not present

## 2019-07-03 DIAGNOSIS — M170 Bilateral primary osteoarthritis of knee: Secondary | ICD-10-CM | POA: Diagnosis not present

## 2019-07-03 DIAGNOSIS — E039 Hypothyroidism, unspecified: Secondary | ICD-10-CM | POA: Diagnosis not present

## 2019-07-09 ENCOUNTER — Ambulatory Visit
Admission: RE | Admit: 2019-07-09 | Discharge: 2019-07-09 | Disposition: A | Discharge status: Home / Self Care | Payer: Medicare Other | Source: Ambulatory Visit | Attending: Internal Medicine | Admitting: Internal Medicine

## 2019-07-09 ENCOUNTER — Other Ambulatory Visit: Payer: Self-pay

## 2019-07-09 ENCOUNTER — Other Ambulatory Visit: Payer: Self-pay | Admitting: Internal Medicine

## 2019-07-09 DIAGNOSIS — N6320 Unspecified lump in the left breast, unspecified quadrant: Secondary | ICD-10-CM

## 2019-07-09 DIAGNOSIS — Z170 Estrogen receptor positive status [ER+]: Secondary | ICD-10-CM | POA: Diagnosis not present

## 2019-07-09 DIAGNOSIS — C773 Secondary and unspecified malignant neoplasm of axilla and upper limb lymph nodes: Secondary | ICD-10-CM | POA: Diagnosis not present

## 2019-07-09 DIAGNOSIS — R590 Localized enlarged lymph nodes: Secondary | ICD-10-CM | POA: Diagnosis not present

## 2019-07-09 DIAGNOSIS — C50512 Malignant neoplasm of lower-outer quadrant of left female breast: Secondary | ICD-10-CM | POA: Diagnosis not present

## 2019-07-09 DIAGNOSIS — N6323 Unspecified lump in the left breast, lower outer quadrant: Secondary | ICD-10-CM | POA: Diagnosis not present

## 2019-07-09 HISTORY — PX: BREAST BIOPSY: SHX20

## 2019-07-10 ENCOUNTER — Encounter: Payer: Self-pay | Admitting: *Deleted

## 2019-07-10 ENCOUNTER — Ambulatory Visit
Admission: RE | Admit: 2019-07-10 | Discharge: 2019-07-10 | Disposition: A | Discharge status: Home / Self Care | Payer: Medicare Other | Source: Ambulatory Visit | Attending: Internal Medicine | Admitting: Internal Medicine

## 2019-07-10 ENCOUNTER — Other Ambulatory Visit: Payer: Self-pay | Admitting: Internal Medicine

## 2019-07-10 DIAGNOSIS — N6012 Diffuse cystic mastopathy of left breast: Secondary | ICD-10-CM | POA: Diagnosis not present

## 2019-07-10 DIAGNOSIS — R921 Mammographic calcification found on diagnostic imaging of breast: Secondary | ICD-10-CM

## 2019-07-10 HISTORY — PX: BREAST BIOPSY: SHX20

## 2019-07-11 ENCOUNTER — Telehealth: Payer: Self-pay | Admitting: Oncology

## 2019-07-11 ENCOUNTER — Other Ambulatory Visit: Payer: Self-pay | Admitting: *Deleted

## 2019-07-11 DIAGNOSIS — Z171 Estrogen receptor negative status [ER-]: Secondary | ICD-10-CM | POA: Insufficient documentation

## 2019-07-11 DIAGNOSIS — C50512 Malignant neoplasm of lower-outer quadrant of left female breast: Secondary | ICD-10-CM

## 2019-07-11 NOTE — Telephone Encounter (Signed)
Spoke with patient to confirm morning appointment  for 1/13, packet will be mailed to patient

## 2019-07-16 NOTE — Progress Notes (Signed)
[facility]  [phone]) [phone] [phone]) [phone]     ID: Julie Thompson DOB: 1946-01-21  MR#: [id number]  KDX#:[id number]  Patient Care Team: Julie Ditch, MD as PCP - General (Internal Medicine) Julie Germany, RN as Oncology Nurse Navigator Julie Kaufmann, RN as Oncology Nurse Navigator Julie Gibson, MD as Attending Physician (Radiation Oncology) Julie Mesa, MD as Consulting Physician (General Surgery) Julie Thompson, Julie Dad, MD as Consulting Physician (Oncology) Julie Cruel, MD OTHER MD:  CHIEF COMPLAINT: triple negative breast cancer  CURRENT TREATMENT: Neoadjuvant chemotherapy   HISTORY OF CURRENT ILLNESS: Julie Thompson had routine screening mammography on 06/21/2019 showing a possible abnormality in the left breast. She underwent left diagnostic mammography with tomography and left breast ultrasonography at [facility] on 07/01/2019 showing: breast density category C; either 2 adjacent masses or 1 complex dumbbell-shaped mass containing calcifications in the left breast at 6 o'clock, the largest of which measures 2.1 cm; chest wall invasion not excluded; 1-2 mm group of calcifications just anterior to the mass(es); single abnormal lymph node in the left axilla; vascular calcifications in anterior left breast.  Accordingly on 07/09/2019 she proceeded to biopsy of the left breast areas in question. The pathology from this procedure ([id number]) showed:  1. Left Breast, 5:30, posterior  - invasive ductal carcinoma with necrosis, grade 3  - Prognostic indicators significant for: estrogen receptor, 0% negative and progesterone receptor, 0% negative. Proliferation marker Ki67 at 60%. HER2 negative by immunohistochemistry (0). 2. Left Breast, 5:30, posterior  - invasive ductal carcinoma with necrosis, grade 3  - ductal carcinoma in situ, intermediate grade 3. Left Axilla, lymph node (1)  - metastatic carcinoma involving a lymph node  And  additional biopsy of the anterior vascular calcifications in the left breast was performed on 07/10/2019. Pathology [id number]) showed: fibrocystic changes with calcifications; no malignancy identified.  This was felt to be concordant.  The patient's subsequent history is as detailed below.   INTERVAL HISTORY: Julie Thompson was evaluated in the multidisciplinary breast cancer clinic on 07/17/2019 accompanied Julie Thompson. Her case was also presented at the multidisciplinary breast cancer conference on the same day. At that time a preliminary plan was proposed: Neoadjuvant chemotherapy, followed by definitive surgery with targeted axillary dissection, adjuvant radiation.     REVIEW OF SYSTEMS: Julie Thompson reports cramping pain in her knees and right shoulder, wearing glasses, shortness of breath, sleeping with two pillows, heartburn, breast lump with pain, easy bruising, arthritis, and anxiety. There were no specific symptoms leading to the original mammogram, which was routinely scheduled. The patient denies unusual headaches, visual changes, nausea, vomiting, stiff neck, dizziness, or gait imbalance. There has been no cough, phlegm production, or pleurisy, no chest pain or pressure, and no change in bowel or bladder habits. The patient denies fever, rash, bleeding, unexplained fatigue or unexplained weight loss. A detailed review of systems was otherwise entirely negative.   PAST MEDICAL HISTORY: Past Medical History:  Diagnosis Date  . Asthma    occassionally has problems and uses an inhaler if needed  . Family history of basal cell carcinoma   . Family history of breast cancer   . Family history of colon cancer   . Family history of kidney cancer   . Family history of leukemia   . Family history of peritoneal cancer   . GERD (gastroesophageal reflux disease)   . H/O hiatal hernia   . IBS (irritable bowel syndrome)     PAST SURGICAL HISTORY: Past Surgical  History:  Procedure Laterality Date    . ABDOMINAL HYSTERECTOMY     age 34  . DILATION AND CURETTAGE OF UTERUS     2 miscarriages age 3 & 77  . FLEXIBLE SIGMOIDOSCOPY N/A 10/22/2013   Procedure: FLEXIBLE SIGMOIDOSCOPY;  Surgeon: Garlan Fair, MD;  Location: [facility];  Service: Endoscopy;  Laterality: N/A;  . KNEE ARTHROSCOPY Left   . TONSILLECTOMY     age 78    FAMILY HISTORY: Family History  Problem Relation Age of Onset  . Breast cancer Maternal Aunt        dx. in her 68s  . Cancer Mother 61       peritoneal cancer, death certificate lists ovarian cancer  . Colon cancer Paternal Uncle        dx. late 67s or early 12s  . Basal cell carcinoma Sister 19       a second diagnosed in her 19s  . Kidney cancer Maternal Grandmother 56  . Diabetes Paternal Grandmother   . Heart Problems Paternal Grandmother   . Leukemia Paternal Uncle        dx. early 28s   Patient's father is currently living at age 46 as of 08-27-19. Patient's mother died from ovarian cancer at age 75. She was diagnosed at age 38. The patient reports breast cancer in a maternal aunt, diagnosed in her 82's. The patient also noted colon cancer in a paternal uncle and kidney cancer in her maternal grandmother.  She has 1 sister.   GYNECOLOGIC HISTORY:  No LMP recorded. Patient has had a hysterectomy. Menarche: 11.74 years old Age at first live birth: 74 years old [place] P 2 LMP age 40 (with hysterectomy) Contraceptive: never used HRT used for 10-12 years  Hysterectomy? Yes, age 41 BSO? 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  Husband Julie Thompson "Julie Thompson" Julie Montana Sr. is a retired Psychologist, prison and probation services. She lives at home with husband Julie Thompson. She has two children from a prior marriage. 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 Julie Thompson, age 89, works as a Programme researcher, broadcasting/film/video in [place], [place]. The patient has 4 grandchildren and 4 great-grandchildren. She attends [employer].    ADVANCED DIRECTIVES: In the absence of any documentation to the contrary, the patient's spouse is their HCPOA.    HEALTH MAINTENANCE: Social History   Tobacco Use  . Smoking status: Never Smoker  . Smokeless tobacco: Never Used  Substance Use Topics  . Alcohol use: No  . Drug use: No     Colonoscopy: 09/2013, with CT virtual 11/2013  PAP: none on file, s/p hysterectomy  Bone density: 04/2001, -0.5   Allergies  Allergen Reactions  . Sulfa Antibiotics Rash  . Penicillins Hives    Current Outpatient Medications  Medication Sig Dispense Refill  . aspirin EC 81 MG tablet Take 81 mg by mouth daily.    Marland Kitchen atorvastatin (LIPITOR) 20 MG tablet Take 20 mg by mouth daily.    . Cholecalciferol (VITAMIN D) 2000 UNITS tablet Take 2,000 Units by mouth daily.    Marland Kitchen co-enzyme Q-10 30 MG capsule Take 20 mg by mouth 3 (three) times daily.    Marland Kitchen esomeprazole (NEXIUM) 20 MG capsule Take 20 mg by mouth daily at 12 noon.    . famotidine (PEPCID) 20 MG tablet Take 40 mg by mouth 2 (two) times daily.    Marland Kitchen FIBER PO Take 1 tablet  by mouth daily.    Marland Kitchen levothyroxine (SYNTHROID) 50 MCG tablet Take 50 mcg by mouth daily.    Marland Kitchen loratadine (CLARITIN) 10 MG tablet Take 10 mg by mouth daily.    . Multiple Vitamin (MULTIVITAMIN WITH MINERALS) TABS tablet Take 1 tablet by mouth daily.    . nabumetone (RELAFEN) 500 MG tablet Take 500 mg by mouth 2 (two) times daily as needed.    . Omega-3 Fatty Acids (FISH OIL) 1200 MG CAPS Take 1,200 mg by mouth daily.     No current facility-administered medications for this visit.    OBJECTIVE: Middle-aged white woman in no acute distress  Vitals:   07/17/19 0836  BP: (!) 165/79  Pulse: 94  Resp: 18  Temp: 98.2 F (36.8 C)  SpO2: 99%     Body mass index is 36.17 kg/m.   Wt Readings from Last 3 Encounters:  07/17/19 185 lb 3.2 oz (84 kg)      ECOG FS:1 - Symptomatic but completely ambulatory  Ocular: Sclerae unicteric, pupils round and  equal Ear-nose-throat: Wearing a mask Lymphatic: No cervical or supraclavicular adenopathy Lungs no rales or rhonchi Heart regular rate and rhythm Abd soft, nontender, positive bowel sounds MSK no focal spinal tenderness, no joint edema Neuro: non-focal, well-oriented, appropriate affect Breasts: The right breast is unremarkable.  There is a palpable mass in the lateral lower aspect of the left breast, not associated with any skin change or nipple retraction.  Both axillae are benign.   LAB RESULTS:  CMP     Component Value Date/Time   NA 143 07/17/2019 0823   K 3.9 07/17/2019 0823   CL 107 07/17/2019 0823   CO2 25 07/17/2019 0823   GLUCOSE 95 07/17/2019 0823   BUN 18 07/17/2019 0823   CREATININE 0.94 07/17/2019 0823   CALCIUM 8.9 07/17/2019 0823   PROT 7.2 07/17/2019 0823   ALBUMIN 3.9 07/17/2019 0823   AST 21 07/17/2019 0823   ALT 18 07/17/2019 0823   ALKPHOS 97 07/17/2019 0823   BILITOT 0.4 07/17/2019 0823   GFRNONAA >60 07/17/2019 0823   GFRAA >60 07/17/2019 0823    No results found for: TOTALPROTELP, ALBUMINELP, A1GS, A2GS, BETS, BETA2SER, GAMS, MSPIKE, SPEI  Lab Results  Component Value Date   WBC 3.9 (L) 07/17/2019   NEUTROABS 2.5 07/17/2019   HGB 12.9 07/17/2019   HCT 39.9 07/17/2019   MCV 95.9 07/17/2019   PLT 228 07/17/2019    No results found for: LABCA2  No components found for: SPQZRA076  No results for input(s): INR in the last 168 hours.  No results found for: LABCA2  No results found for: AUQ333  No results found for: LKT625  No results found for: WLS937  No results found for: CA2729  No components found for: HGQUANT  No results found for: CEA1 / No results found for: CEA1   No results found for: AFPTUMOR  No results found for: CHROMOGRNA  No results found for: KPAFRELGTCHN, LAMBDASER, KAPLAMBRATIO (kappa/lambda light chains)  No results found for: HGBA, HGBA2QUANT, HGBFQUANT, HGBSQUAN (Hemoglobinopathy evaluation)   No  results found for: LDH  No results found for: IRON, TIBC, IRONPCTSAT (Iron and TIBC)  No results found for: FERRITIN  Urinalysis No results found for: COLORURINE, APPEARANCEUR, LABSPEC, PHURINE, GLUCOSEU, HGBUR, BILIRUBINUR, KETONESUR, PROTEINUR, UROBILINOGEN, NITRITE, LEUKOCYTESUR   STUDIES: US BREAST LTD UNI LEFT INC AXILLA  Result Date: 07/01/2019 CLINICAL DATA:  The patient was called back for a left breast mass and calcifications. EXAM:  DIGITAL DIAGNOSTIC LEFT MAMMOGRAM WITH TOMO ULTRASOUND LEFT BREAST COMPARISON:  Previous exam(s). ACR Breast Density Category c: The breast tissue is heterogeneously dense, which may obscure small masses. FINDINGS: There are 2 adjacent masses at 6 o'clock in the left breast containing suspicious calcifications. There is a group of calcifications just anterior to the more anterior of the 2 masses which are clustered and demonstrate round morphology, spanning between 1 and 2 mm. The calcifications in the anterior left breast are vascular. No other suspicious findings. On physical exam, a lump is felt in the inferior left breast. Targeted ultrasound is performed, showing either 1 dumbbell-shaped mass or 2 adjacent masses in the left breast. The first of these masses is seen at 6 o'clock, 7 cm from the nipple measuring 1.4 x 1.6 x 2.1 cm, containing calcifications. Chest wall invasion is not excluded on these images. The more anterior of the 2 masses measures 1.5 x 1.7 x 1.7 cm, containing calcifications. There is a single abnormal lymph node in the left axilla. On the 1 into the lymph node, the cortex measures 2.8 mm. On the other in, the cortex measures up to 5 mm. This lymph node stands out compared to the remainder of the axillary lymph nodes. IMPRESSION: Either 2 adjacent masses or 1 complex dumbbell-shaped mass containing calcifications. There is a small group of calcifications just anterior to the 2 masses spanning between 1 and 2 mm. There is a single abnormal  lymph node in the left axilla. There are vascular calcifications in the anterior left breast. RECOMMENDATION: Recommend ultrasound-guided biopsy of the 2 adjacent masses in the left breast. Recommend stereotactic biopsy of the small group of calcifications just anterior to the masses. Recommend ultrasound-guided biopsy of the abnormal left axillary node. I have discussed the findings and recommendations with the patient. If applicable, a reminder letter will be sent to the patient regarding the next appointment. BI-RADS CATEGORY  5: Highly suggestive of malignancy. Electronically Signed   By: Dorise Bullion III M.D   On: 07/01/2019 14:34   MM DIAG BREAST TOMO UNI LEFT  Result Date: 07/01/2019 CLINICAL DATA:  The patient was called back for a left breast mass and calcifications. EXAM: DIGITAL DIAGNOSTIC LEFT MAMMOGRAM WITH TOMO ULTRASOUND LEFT BREAST COMPARISON:  Previous exam(s). ACR Breast Density Category c: The breast tissue is heterogeneously dense, which may obscure small masses. FINDINGS: There are 2 adjacent masses at 6 o'clock in the left breast containing suspicious calcifications. There is a group of calcifications just anterior to the more anterior of the 2 masses which are clustered and demonstrate round morphology, spanning between 1 and 2 mm. The calcifications in the anterior left breast are vascular. No other suspicious findings. On physical exam, a lump is felt in the inferior left breast. Targeted ultrasound is performed, showing either 1 dumbbell-shaped mass or 2 adjacent masses in the left breast. The first of these masses is seen at 6 o'clock, 7 cm from the nipple measuring 1.4 x 1.6 x 2.1 cm, containing calcifications. Chest wall invasion is not excluded on these images. The more anterior of the 2 masses measures 1.5 x 1.7 x 1.7 cm, containing calcifications. There is a single abnormal lymph node in the left axilla. On the 1 into the lymph node, the cortex measures 2.8 mm. On the other  in, the cortex measures up to 5 mm. This lymph node stands out compared to the remainder of the axillary lymph nodes. IMPRESSION: Either 2 adjacent masses or 1 complex dumbbell-shaped  mass containing calcifications. There is a small group of calcifications just anterior to the 2 masses spanning between 1 and 2 mm. There is a single abnormal lymph node in the left axilla. There are vascular calcifications in the anterior left breast. RECOMMENDATION: Recommend ultrasound-guided biopsy of the 2 adjacent masses in the left breast. Recommend stereotactic biopsy of the small group of calcifications just anterior to the masses. Recommend ultrasound-guided biopsy of the abnormal left axillary node. I have discussed the findings and recommendations with the patient. If applicable, a reminder letter will be sent to the patient regarding the next appointment. BI-RADS CATEGORY  5: Highly suggestive of malignancy. Electronically Signed   By: Dorise Bullion III M.D   On: 07/01/2019 14:34   MM 3D SCREEN BREAST BILATERAL  Result Date: 06/23/2019 CLINICAL DATA:  Screening. EXAM: DIGITAL SCREENING BILATERAL MAMMOGRAM WITH TOMO AND CAD COMPARISON:  Previous exam(s). ACR Breast Density Category Julie: There are scattered areas of fibroglandular density. FINDINGS: In the left breast, calcifications and a mass warrant further evaluation. In the right breast, no findings suspicious for malignancy. Images were processed with CAD. IMPRESSION: Further evaluation is suggested for calcifications and a mass in the left breast. RECOMMENDATION: Diagnostic mammogram and possibly ultrasound of the left breast. (Code:[id number]-L-[id number]) The patient will be contacted regarding the findings, and additional imaging will be scheduled. BI-RADS CATEGORY  0: Incomplete. Need additional imaging evaluation and/or prior mammograms for comparison. Electronically Signed   By: Kristopher Oppenheim M.D.   On: 06/23/2019 14:53   [place] AXILLARY NODE CORE BIOPSY LEFT  Addendum  Date: 07/10/2019   ADDENDUM REPORT: 07/10/2019 14:14 ADDENDUM: Pathology revealed: 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 This was found to be concordant by Dr. Peggye Fothergill. 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 This was found to be concordant by Dr. Peggye Fothergill. METASTATIC CARCINOMA INVOLVING A LYMPH NODE of the Left axilla. This was found to be concordant by Dr. Peggye Fothergill. Pathology results were discussed with the patient in person by Dr. Hassan Rowan at the time of her stereotactic biopsy performed today. The patient reported doing well after the biopsies with tenderness at the sites. Post biopsy instructions and care were reviewed and questions were answered. The patient was encouraged to call [facility] for any additional concerns. The patient was referred to [facility] on July 17, 2019. [facility] for a Left breast stereotatic guided biopsy to evaluate extent of disease. Pathology results reported by Terie Purser, RN on 07/10/2019. Electronically Signed   By: Evangeline Dakin M.D.   On: 07/10/2019 14:14   Result Date: 07/10/2019 CLINICAL DATA:  74 year old with screening detected contiguous adjacent masses involving the LOWER LEFT breast at posterior depth associated with suspicious calcifications. Diagnostic workup also demonstrated a solitary abnormal LEFT axillary lymph node with focal cortical thickening up to 5 mm. She also has an indeterminate 2 mm group of calcifications in the LOWER LEFT breast anterior to the masses for which stereotactic tomosynthesis biopsy has been scheduled for tomorrow. EXAM: ULTRASOUND-GUIDED LEFT BREAST CORE  NEEDLE BIOPSY x 2 ULTRASOUND-GUIDED CORE NEEDLE BIOPSY OF A LEFT AXILLARY LYMPH NODE COMPARISON:  Previous exam(s). FINDINGS: I met with the patient and  we discussed the procedure of ultrasound-guided biopsy, including benefits and alternatives. We discussed the high likelihood of a successful procedure. We discussed the risks of the procedure, including infection, bleeding, tissue injury, clip migration, and inadequate sampling. Informed written consent was given. The usual time-out protocol was performed immediately prior to the procedure. # 1) Lesion quadrant: LOWER OUTER QUADRANT, 5:30 o'clock position 7 cm from nipple, ANTERIOR mass: Initially, using sterile technique with chlorhexidine as skin antisepsis, 1% lidocaine and 1% lidocaine with epinephrine as local anesthetic, under direct ultrasound visualization, a 12 gauge Bard Marquee core needle device placed through an 11 gauge introducer needle was used to perform biopsy of the ANTERIOR mass at the 5:30 o'clock position approximately 7 cm from nipple using a lateral approach. At the conclusion of the procedure a ribbon shaped tissue marker clip was deployed into the biopsy cavity. # 2) Lesion quadrant: LOWER OUTER QUADRANT, 5:30 o'clock position 7 cm from nipple, POSTERIOR mass: Using the same dermatotomy site as above, 1% lidocaine and 1% lidocaine with epinephrine as local anesthetic, under direct ultrasound visualization, a second 12 gauge Bard Marquee core needle device placed through an 11 gauge introducer needle was used perform biopsy of the POSTERIOR mass at the 5:30 o'clock position approximately 7 cm from the nipple using a LATERAL approach. At the conclusion of the procedure a coil shaped tissue marker clip was deployed into the biopsy cavity. # 3) LEFT axillary node: Using sterile technique with chlorhexidine as skin antisepsis, 1% lidocaine and 1% lidocaine with epinephrine as local anesthetic, under direct ultrasound visualization, a 14 gauge  Bard Marquee core needle device placed through a 13 gauge introducer needle was used perform biopsy of the abnormal LEFT axillary lymph node with focal cortical thickening using an inferolateral approach. At the conclusion of the procedure a Q shaped tissue marker clip was deployed into the biopsy cavity. Follow up 2 view mammogram was performed and dictated separately. IMPRESSION: Ultrasound-guided biopsy of 2 adjacent contiguous masses involving the LOWER OUTER QUADRANT of the LEFT breast and ultrasound-guided biopsy of a solitary abnormal LEFT axillary lymph node. No apparent complications. Electronically Signed: By: Evangeline Dakin M.D. On: 07/09/2019 14:14   MM CLIP PLACEMENT LEFT  Addendum Date: 07/11/2019   ADDENDUM REPORT: 07/11/2019 14:10 ADDENDUM: Pathology revealed FIBROCYSTIC CHANGES WITH CALCIFICATIONS of the LEFT breast, central. This was found to be concordant by Dr. Hassan Rowan. Pathology results were discussed with the patient by telephone. The patient reported doing well after the biopsy with tenderness at the site. Post biopsy instructions and care were reviewed and questions were answered. The patient was encouraged to call [facility] for any additional concerns. The patient has a recent diagnosis of LEFT breast cancer and was referred to [facility] on July 16, 2018. Pathology results reported by Terie Purser, RN on 07/11/2019. Electronically Signed   By: Margarette Canada M.D.   On: 07/11/2019 14:10   Result Date: 07/11/2019 CLINICAL DATA:  74 year old female for tissue sampling of 0.2 cm group of calcifications within the central LEFT breast. EXAM: LEFT BREAST STEREOTACTIC CORE NEEDLE BIOPSY 3D DIAGNOSTIC LEFT MAMMOGRAM POST STEREOTACTIC BIOPSY COMPARISON:  Previous exams. FINDINGS: The patient and I discussed the procedure of stereotactic-guided biopsy including benefits and alternatives. We  discussed the high likelihood of a successful procedure. We discussed the risks of the procedure including infection, bleeding, tissue injury, clip migration, and inadequate sampling. Informed written consent  was given. The usual time out protocol was performed immediately prior to the procedure. Using sterile technique and 1% Lidocaine as local anesthetic, under stereotactic guidance, a 9 gauge vacuum assisted device was used to perform core needle biopsy of the 0.2 cm group of calcifications within the central LEFT breast using a LATERAL approach. Specimen radiograph was performed showing calcifications. Specimens with calcifications are identified for pathology. At the conclusion of the procedure, 2 separate X shaped tissue marker clips were deployed into the biopsy cavity. The 2nd X shaped clip was placed as the 1st clip was not visualized on the immediate post clip image during the procedure. Follow-up 2-view mammogram was performed demonstrating 2 X shaped tissue marker clips in satisfactory position at the expected site of biopsy within the LEFT breast. IMPRESSION: Stereotactic-guided biopsy of 0.2 cm group of calcifications within the central LEFT breast. No apparent complications. Satisfactory position of 2 X shaped tissue marker clips within the LEFT breast following stereotactic guided biopsy. Electronically Signed: By: Margarette Canada M.D. On: 07/10/2019 12:10   MM CLIP PLACEMENT LEFT  Result Date: 07/09/2019 CLINICAL DATA:  Confirmation clip placement after ultrasound-guided core needle biopsy of 2 adjacent contiguous masses involving the LOWER OUTER QUADRANT of the LEFT breast at POSTERIOR depth and ultrasound-guided core needle biopsy of a solitary abnormal LEFT axillary lymph node. EXAM: 2D AND TOMOSYNTHESIS DIAGNOSTIC LEFT MAMMOGRAM POST ULTRASOUND BIOPSY COMPARISON:  Previous exam(s). FINDINGS: Mammographic images were obtained following ultrasound guided biopsy of 2 adjacent contiguous masses  involving the LOWER OUTER LEFT breast and ultrasound-guided biopsy of a solitary abnormal LEFT axillary lymph node. The ribbon shaped tissue marking clip is appropriately positioned within the biopsied ANTERIOR mass in the [place] at POSTERIOR depth. The coil shaped tissue marker clip is appropriately positioned within the biopsied POSTERIOR mass in the [place] at POSTERIOR depth. The ribbon clip and the coil clip are separated by approximately 1.9 cm. The Q shaped tissue marker clip is appropriately positioned within the biopsied abnormal LEFT axillary lymph node. Expected post biopsy changes are present at each site without evidence of hematoma. IMPRESSION: 1. Appropriate positioning of the ribbon shaped tissue marking clip within the biopsied ANTERIOR mass in the [place] at POSTERIOR depth. 2. Appropriate positioning of the coil shaped tissue marker clip within the biopsied POSTERIOR mass in the LOWER OUTER QUADRANT at POSTERIOR depth. 3. Appropriate positioning of the Q shaped tissue marker clip within the biopsied abnormal LEFT axillary lymph node. Final Assessment: Post Procedure Mammograms for Marker Placement Electronically Signed   By: Evangeline Dakin M.D.   On: 07/09/2019 14:18   MM LT BREAST BX W LOC DEV 1ST LESION IMAGE BX SPEC STEREO GUIDE  Addendum Date: 07/11/2019   ADDENDUM REPORT: 07/11/2019 14:10 ADDENDUM: Pathology revealed FIBROCYSTIC CHANGES WITH CALCIFICATIONS of the LEFT breast, central. This was found to be concordant by Dr. Hassan Rowan. Pathology results were discussed with the patient by telephone. The patient reported doing well after the biopsy with tenderness at the site. Post biopsy instructions and care were reviewed and questions were answered. The patient was encouraged to call The [facility] for any additional concerns. The patient has a recent diagnosis of LEFT breast cancer and was referred to [facility] on July 16, 2018. Pathology results reported by Terie Purser, RN on 07/11/2019. Electronically Signed   By: Margarette Canada M.D.   On: 07/11/2019  14:10   Result Date: 07/11/2019 CLINICAL DATA:  74 year old female for tissue sampling of 0.2 cm group of calcifications within the central LEFT breast. EXAM: LEFT BREAST STEREOTACTIC CORE NEEDLE BIOPSY 3D DIAGNOSTIC LEFT MAMMOGRAM POST STEREOTACTIC BIOPSY COMPARISON:  Previous exams. FINDINGS: The patient and I discussed the procedure of stereotactic-guided biopsy including benefits and alternatives. We discussed the high likelihood of a successful procedure. We discussed the risks of the procedure including infection, bleeding, tissue injury, clip migration, and inadequate sampling. Informed written consent was given. The usual time out protocol was performed immediately prior to the procedure. Using sterile technique and 1% Lidocaine as local anesthetic, under stereotactic guidance, a 9 gauge vacuum assisted device was used to perform core needle biopsy of the 0.2 cm group of calcifications within the central LEFT breast using a LATERAL approach. Specimen radiograph was performed showing calcifications. Specimens with calcifications are identified for pathology. At the conclusion of the procedure, 2 separate X shaped tissue marker clips were deployed into the biopsy cavity. The 2nd X shaped clip was placed as the 1st clip was not visualized on the immediate post clip image during the procedure. Follow-up 2-view mammogram was performed demonstrating 2 X shaped tissue marker clips in satisfactory position at the expected site of biopsy within the LEFT breast. IMPRESSION: Stereotactic-guided biopsy of 0.2 cm group of calcifications within the central LEFT breast. No apparent complications. Satisfactory position of 2 X shaped tissue marker clips within the LEFT breast following stereotactic guided biopsy.  Electronically Signed: By: Margarette Canada M.D. 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 This was found to be concordant by Dr. Peggye Fothergill. 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 This was found to be concordant by Dr. Peggye Fothergill. METASTATIC CARCINOMA INVOLVING A LYMPH NODE of the Left axilla. This was found to be concordant by Dr. Peggye Fothergill. Pathology results were discussed with the patient in person by Dr. Hassan Rowan at the time of her stereotactic biopsy performed today. The patient reported doing well after the biopsies with tenderness at the sites. Post biopsy instructions and care were reviewed and questions were answered. The patient was encouraged to call The [place] for any additional concerns. The patient was referred to [facility] on July 17, 2019. [facility] for a Left breast stereotatic guided biopsy to evaluate extent of disease. Pathology results reported by Terie Purser, RN on 07/10/2019. Electronically Signed   By: Evangeline Dakin M.D.   On: 07/10/2019 14:14   Result Date: 07/10/2019 CLINICAL DATA:  74 year old with screening detected contiguous adjacent masses involving the LOWER LEFT breast at posterior depth associated with suspicious calcifications. Diagnostic workup also demonstrated a solitary abnormal LEFT axillary lymph node with focal cortical thickening up to 5 mm. She also has an indeterminate 2 mm group of calcifications in the LOWER LEFT breast  anterior to the masses for which stereotactic tomosynthesis biopsy has been scheduled for tomorrow. EXAM: ULTRASOUND-GUIDED LEFT BREAST CORE NEEDLE BIOPSY x 2 ULTRASOUND-GUIDED CORE NEEDLE BIOPSY OF A  LEFT AXILLARY LYMPH NODE COMPARISON:  Previous exam(s). FINDINGS: I met with the patient and we discussed the procedure of ultrasound-guided biopsy, including benefits and alternatives. We discussed the high likelihood of a successful procedure. We discussed the risks of the procedure, including infection, bleeding, tissue injury, clip migration, and inadequate sampling. Informed written consent was given. The usual time-out protocol was performed immediately prior to the procedure. # 1) Lesion quadrant: LOWER OUTER QUADRANT, 5:30 o'clock position 7 cm from nipple, ANTERIOR mass: Initially, using sterile technique with chlorhexidine as skin antisepsis, 1% lidocaine and 1% lidocaine with epinephrine as local anesthetic, under direct ultrasound visualization, a 12 gauge Bard Marquee core needle device placed through an 11 gauge introducer needle was used to perform biopsy of the ANTERIOR mass at the 5:30 o'clock position approximately 7 cm from nipple using a lateral approach. At the conclusion of the procedure a ribbon shaped tissue marker clip was deployed into the biopsy cavity. # 2) Lesion quadrant: LOWER OUTER QUADRANT, 5:30 o'clock position 7 cm from nipple, POSTERIOR mass: Using the same dermatotomy site as above, 1% lidocaine and 1% lidocaine with epinephrine as local anesthetic, under direct ultrasound visualization, a second 12 gauge Bard Marquee core needle device placed through an 11 gauge introducer needle was used perform biopsy of the POSTERIOR mass at the 5:30 o'clock position approximately 7 cm from the nipple using a LATERAL approach. At the conclusion of the procedure a coil shaped tissue marker clip was deployed into the biopsy cavity. # 3) LEFT axillary node: Using sterile technique with  chlorhexidine as skin antisepsis, 1% lidocaine and 1% lidocaine with epinephrine as local anesthetic, under direct ultrasound visualization, a 14 gauge Bard Marquee core needle device placed through a 13 gauge introducer needle was used perform biopsy of the abnormal LEFT axillary lymph node with focal cortical thickening using an inferolateral approach. At the conclusion of the procedure a Q shaped tissue marker clip was deployed into the biopsy cavity. Follow up 2 view mammogram was performed and dictated separately. IMPRESSION: Ultrasound-guided biopsy of 2 adjacent contiguous masses involving the LOWER OUTER QUADRANT of the LEFT breast and ultrasound-guided biopsy of a solitary abnormal LEFT axillary lymph node. No apparent complications. Electronically Signed: By: Evangeline Dakin M.D. 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 This was found to be concordant by Dr. Peggye Fothergill. 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 This was found to be concordant by Dr. Peggye Fothergill. METASTATIC CARCINOMA INVOLVING A LYMPH NODE of the Left axilla. This was found to be concordant by Dr. Peggye Fothergill. Pathology results were discussed with the patient in person by Dr. Hassan Rowan at the time of her stereotactic biopsy performed today. The patient reported doing well after the biopsies with tenderness at the sites. Post biopsy instructions and care were reviewed and questions were answered. The patient was encouraged to call The [facility] for any additional concerns. The patient was referred to [facility] on July 17, 2019. [facility] for a Left  breast stereotatic guided biopsy to evaluate extent of disease. Pathology results reported by Terie Purser, RN on 07/10/2019. Electronically Signed   By: Evangeline Dakin M.D.   On: 07/10/2019 14:14   Result Date: 07/10/2019 CLINICAL DATA:  74 year old with screening detected contiguous adjacent masses involving the LOWER LEFT breast at posterior depth associated with suspicious calcifications. Diagnostic workup also demonstrated a solitary abnormal LEFT axillary lymph node with focal cortical thickening up to 5 mm. She also has an indeterminate 2 mm group of calcifications in the LOWER LEFT breast anterior to the masses for which stereotactic tomosynthesis biopsy has been scheduled for tomorrow. EXAM: ULTRASOUND-GUIDED LEFT BREAST CORE NEEDLE BIOPSY x 2 ULTRASOUND-GUIDED CORE NEEDLE BIOPSY OF A LEFT AXILLARY LYMPH NODE COMPARISON:  Previous exam(s). FINDINGS: I met with the patient and we discussed the procedure of ultrasound-guided biopsy, including benefits and alternatives. We discussed the high likelihood of a successful procedure. We discussed the risks of the procedure, including infection, bleeding, tissue injury, clip migration, and inadequate sampling. Informed written consent was given. The usual time-out protocol was performed immediately prior to the procedure. # 1) Lesion quadrant: LOWER OUTER QUADRANT, 5:30 o'clock position 7 cm from nipple, ANTERIOR mass: Initially, using sterile technique with chlorhexidine as skin antisepsis, 1% lidocaine and 1% lidocaine with epinephrine as local anesthetic, under direct ultrasound visualization, a 12 gauge Bard Marquee core needle device placed through an 11 gauge introducer needle was used to perform biopsy of the ANTERIOR mass at the 5:30 o'clock position approximately 7 cm from nipple using a lateral  approach. At the conclusion of the procedure a ribbon shaped tissue marker clip was deployed into the biopsy cavity. # 2) Lesion quadrant: LOWER OUTER QUADRANT, 5:30 o'clock position 7 cm from nipple, POSTERIOR mass: Using the same dermatotomy site as above, 1% lidocaine and 1% lidocaine with epinephrine as local anesthetic, under direct ultrasound visualization, a second 12 gauge Bard Marquee core needle device placed through an 11 gauge introducer needle was used perform biopsy of the POSTERIOR mass at the 5:30 o'clock position approximately 7 cm from the nipple using a LATERAL approach. At the conclusion of the procedure a coil shaped tissue marker clip was deployed into the biopsy cavity. # 3) LEFT axillary node: Using sterile technique with chlorhexidine as skin antisepsis, 1% lidocaine and 1% lidocaine with epinephrine as local anesthetic, under direct ultrasound visualization, a 14 gauge Bard Marquee core needle device placed through a 13 gauge introducer needle was used perform biopsy of the abnormal LEFT axillary lymph node with focal cortical thickening using an inferolateral approach. At the conclusion of the procedure a Q shaped tissue marker clip was deployed into the biopsy cavity. Follow up 2 view mammogram was performed and dictated separately. IMPRESSION: Ultrasound-guided biopsy of 2 adjacent contiguous masses involving the LOWER OUTER QUADRANT of the LEFT breast and ultrasound-guided biopsy of a solitary abnormal LEFT axillary lymph node. No apparent complications. Electronically Signed: By: Evangeline Dakin M.D. On: 07/09/2019 14:14     ELIGIBLE FOR AVAILABLE RESEARCH PROTOCOL: no  ASSESSMENT: 74 y.o. 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 Specifically we reviewed the biology of the patient's diagnosis and the specifics of her situation.  We first reviewed the fact that cancer is not one disease but more than 100 different diseases and that it is important to keep them separate-- otherwise when friends and relatives discuss their own cancer experiences with Lezly confusion can result. Similarly we explained that if breast cancer spreads to the bone or liver, the patient would not have bone cancer or liver cancer, but breast cancer in the bone and breast cancer in the liver: one cancer in three places-- not 3 different cancers which otherwise would have to be treated in 3 different ways.  We discussed the difference between local and systemic therapy. In terms of loco-regional treatment, lumpectomy plus radiation is equivalent to mastectomy as far as survival is concerned. For this reason, and because the cosmetic results are generally superior, we recommend breast conserving surgery.   We also noted that in terms of sequencing of treatments, whether systemic therapy or surgery is done first does not affect the ultimate outcome.  This is relevant to Demiyah's case since we believe she will benefit from having chemotherapy before surgery, which should make the surgery easier, with better margins, and possibly allow her to keep her breast, which would be her preference  There is some risk that this cancer may have already spread to other parts of her body.  Given the fact that we may be dealing with a stage III cancer (if both the biopsies passes are really 1 dumbbell-shaped mass) we are proceeding with staging studies.  I am  hopeful and anticipate that these will be negative.  That of course does not mean that she does not have microscopic disease outside the breast.  It would not be possible with any current modality to rule out microscopic metastases to liver lungs or bones for example.  That is the rationale for systemic therapy  Next we went over the options for systemic therapy which are anti-estrogens, anti-HER-2 immunotherapy, and chemotherapy. Carter does not meet criteria for anti-HER-2 immunotherapy or antiestrogens.  Her only option for systemic treatment is chemotherapy and therefore we would like to optimize that by starting with doxorubicin and cyclophosphamide in dose dense fashion, followed by carboplatin and paclitaxel.  We discussed the possible toxicities side effects and complications of these agents and she will discuss them further when she returns for chemotherapy teaching.  Given the family history of ovarian cancer referral to genetics has been placed.  She will need to have a port placed, and in addition to her staging studies will have an echocardiogram prior to the start of treatment  We are hoping to begin 07/29/2018.  I will see her before that date to make sure everything is in place  Taisley has a good understanding of the overall plan. She agrees with it. She knows the goal of treatment in her case is cure. She will call with any problems that may develop before her next visit here.  Total encounter time 65 minutes.Julie Cruel, MD   07/17/2019 7:06 PM [facility] [place], [place] [address] Tel. [phone]    Fax. [phone]   This document serves as a record of services personally performed by Lurline Del, MD. It was created on his behalf by Joellen Jersey  [place], a trained medical scribe. The creation of this record is based on the scribe's personal observations and the provider's statements to them.   I,  Lurline Del MD, have reviewed the above documentation for accuracy and completeness, and I agree with the above.  *Total Encounter Time as defined by the Centers for Medicare and Medicaid Services includes, in addition to the face-to-face time of a patient visit (documented in the note above) non-face-to-face time: obtaining and reviewing outside history, ordering and reviewing medications, tests or procedures, care coordination (communications with other health care professionals or caregivers) and documentation in the medical record.

## 2019-07-16 NOTE — Progress Notes (Signed)
Radiation Oncology         [phone] ________________________________  Initial outpatient Consultation in person  Name: Julie Thompson MRN: [id number]  Date: 07/17/2019  DOB: 1945/08/19  BD:[id number], Alysia Penna, MD  Donnie Mesa, MD   REFERRING PHYSICIAN: Donnie Mesa, MD  DIAGNOSIS:    ICD-10-CM   1. Malignant neoplasm of lower-outer quadrant of left breast of female, estrogen receptor negative ([place])  C50.512    Z17.1   Cancer Staging Malignant neoplasm of lower-outer quadrant of left breast of female, estrogen receptor negative ([place]) Staging form: Breast, AJCC 8th Edition - Clinical stage from 07/17/2019: Stage IIIB (cT2, cN1, cM0, G3, ER-, PR-, HER2-) - Unsigned  CHIEF COMPLAINT: Here to discuss management of left breast cancer  HISTORY OF PRESENT ILLNESS::Julie Thompson is a 74 y.o. female who presented with breast abnormality on the following imaging: screening mammogram on the date of 06/21/2019.  Symptoms, if any, at that time, were: none.  Ultrasound of breast on 07/01/2019 revealed: either 2 adjacent masses or 1 complex dumbbell-shaped mass containing calcifications in left breast at 6 o'clock; a small group of calcifications anterior to main mass(es); single abnormal left lymph node.   Biopsy on date of 07/09/2019 showed: invasive ductal carcinoma with necrosis; intermediate grade DCIS; lymph node positive for metastatic carcinoma (1/1).  ER status: negative; PR status negative, Her2 status negative; Grade 3.  Biopsy of the anterior calcifications on date of 07/10/19 was negative for malignancy.  She is here with her sister.  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   PREVIOUS RADIATION THERAPY: No  PAST MEDICAL HISTORY:  has a past medical history of Asthma, GERD (gastroesophageal reflux disease), H/O hiatal hernia, and IBS (irritable bowel syndrome).    PAST SURGICAL HISTORY: Past Surgical History:  Procedure Laterality Date  . ABDOMINAL HYSTERECTOMY     age 58  .  DILATION AND CURETTAGE OF UTERUS     2 miscarriages age 60 & 22  . FLEXIBLE SIGMOIDOSCOPY N/A 10/22/2013   Procedure: FLEXIBLE SIGMOIDOSCOPY;  Surgeon: Garlan Fair, MD;  Location: [facility];  Service: Endoscopy;  Laterality: N/A;  . KNEE ARTHROSCOPY Left   . TONSILLECTOMY     age 35    FAMILY HISTORY: family history includes Breast cancer in her maternal aunt; Colon cancer in her paternal uncle.  SOCIAL HISTORY:  reports that she has never smoked. She has never used smokeless tobacco. She reports that she does not drink alcohol or use drugs.  ALLERGIES: Sulfa antibiotics and Penicillins  MEDICATIONS:  Current Outpatient Medications  Medication Sig Dispense Refill  . aspirin EC 81 MG tablet Take 81 mg by mouth daily.    Marland Kitchen atorvastatin (LIPITOR) 20 MG tablet Take 20 mg by mouth daily.    . Cholecalciferol (VITAMIN D) 2000 UNITS tablet Take 2,000 Units by mouth daily.    Marland Kitchen co-enzyme Q-10 30 MG capsule Take 20 mg by mouth 3 (three) times daily.    Marland Kitchen esomeprazole (NEXIUM) 20 MG capsule Take 20 mg by mouth daily at 12 noon.    . famotidine (PEPCID) 20 MG tablet Take 40 mg by mouth 2 (two) times daily.    Marland Kitchen FIBER PO Take 1 tablet by mouth daily.    Marland Kitchen levothyroxine (SYNTHROID) 50 MCG tablet Take 50 mcg by mouth daily.    Marland Kitchen loratadine (CLARITIN) 10 MG tablet Take 10 mg by mouth daily.    . Multiple Vitamin (MULTIVITAMIN WITH MINERALS) TABS tablet Take 1 tablet by mouth daily.    Marland Kitchen  nabumetone (RELAFEN) 500 MG tablet Take 500 mg by mouth 2 (two) times daily as needed.    . Omega-3 Fatty Acids (FISH OIL) 1200 MG CAPS Take 1,200 mg by mouth daily.     No current facility-administered medications for this encounter.    REVIEW OF SYSTEMS: As above  PHYSICAL EXAM:  vitals were not taken for this visit.   See medical oncology notes. General: Alert and oriented, in no acute distress  Psychiatric: Judgment and insight are intact. Affect is appropriate. Breasts: Extensive bruising of left  breast.  There is a mass extending from 7:00 to 5:00 of the left lower breast that is firm, at least 4 cm in dimension. No other palpable masses appreciated in the breasts or axillae bilaterally.   ECOG = 1  0 - Asymptomatic (Fully active, able to carry on all predisease activities without restriction)  1 - Symptomatic but completely ambulatory (Restricted in physically strenuous activity but ambulatory and able to carry out work of a light or sedentary nature. For example, light housework, office work)  2 - Symptomatic, <50% in bed during the day (Ambulatory and capable of all self care but unable to carry out any work activities. Up and about more than 50% of waking hours)  3 - Symptomatic, >50% in bed, but not bedbound (Capable of only limited self-care, confined to bed or chair 50% or more of waking hours)  4 - Bedbound (Completely disabled. Cannot carry on any self-care. Totally confined to bed or chair)  5 - Death   Eustace Pen MM, Creech RH, Tormey DC, et al. [phone]). "Toxicity and response criteria of the [facility] Group". [place] Oncol. 5 (6): 649-55   LABORATORY DATA:  Lab Results  Component Value Date   WBC 3.9 (L) 07/17/2019   HGB 12.9 07/17/2019   HCT 39.9 07/17/2019   MCV 95.9 07/17/2019   PLT 228 07/17/2019   CMP     Component Value Date/Time   NA 143 07/17/2019 0823   K 3.9 07/17/2019 0823   CL 107 07/17/2019 0823   CO2 25 07/17/2019 0823   GLUCOSE 95 07/17/2019 0823   BUN 18 07/17/2019 0823   CREATININE 0.94 07/17/2019 0823   CALCIUM 8.9 07/17/2019 0823   PROT 7.2 07/17/2019 0823   ALBUMIN 3.9 07/17/2019 0823   AST 21 07/17/2019 0823   ALT 18 07/17/2019 0823   ALKPHOS 97 07/17/2019 0823   BILITOT 0.4 07/17/2019 0823   GFRNONAA >60 07/17/2019 0823   GFRAA >60 07/17/2019 0823         RADIOGRAPHY: US BREAST LTD UNI LEFT INC AXILLA  Result Date: 07/01/2019 CLINICAL DATA:  The patient was called back for a left breast mass and  calcifications. EXAM: DIGITAL DIAGNOSTIC LEFT MAMMOGRAM WITH TOMO ULTRASOUND LEFT BREAST COMPARISON:  Previous exam(s). ACR Breast Density Category c: The breast tissue is heterogeneously dense, which may obscure small masses. FINDINGS: There are 2 adjacent masses at 6 o'clock in the left breast containing suspicious calcifications. There is a group of calcifications just anterior to the more anterior of the 2 masses which are clustered and demonstrate round morphology, spanning between 1 and 2 mm. The calcifications in the anterior left breast are vascular. No other suspicious findings. On physical exam, a lump is felt in the inferior left breast. Targeted ultrasound is performed, showing either 1 dumbbell-shaped mass or 2 adjacent masses in the left breast. The first of these masses is seen at 6 o'clock, 7 cm from  the nipple measuring 1.4 x 1.6 x 2.1 cm, containing calcifications. Chest wall invasion is not excluded on these images. The more anterior of the 2 masses measures 1.5 x 1.7 x 1.7 cm, containing calcifications. There is a single abnormal lymph node in the left axilla. On the 1 into the lymph node, the cortex measures 2.8 mm. On the other in, the cortex measures up to 5 mm. This lymph node stands out compared to the remainder of the axillary lymph nodes. IMPRESSION: Either 2 adjacent masses or 1 complex dumbbell-shaped mass containing calcifications. There is a small group of calcifications just anterior to the 2 masses spanning between 1 and 2 mm. There is a single abnormal lymph node in the left axilla. There are vascular calcifications in the anterior left breast. RECOMMENDATION: Recommend ultrasound-guided biopsy of the 2 adjacent masses in the left breast. Recommend stereotactic biopsy of the small group of calcifications just anterior to the masses. Recommend ultrasound-guided biopsy of the abnormal left axillary node. I have discussed the findings and recommendations with the patient. If  applicable, a reminder letter will be sent to the patient regarding the next appointment. BI-RADS CATEGORY  5: Highly suggestive of malignancy. Electronically Signed   By: Dorise Bullion III M.D   On: 07/01/2019 14:34   MM DIAG BREAST TOMO UNI LEFT  Result Date: 07/01/2019 CLINICAL DATA:  The patient was called back for a left breast mass and calcifications. EXAM: DIGITAL DIAGNOSTIC LEFT MAMMOGRAM WITH TOMO ULTRASOUND LEFT BREAST COMPARISON:  Previous exam(s). ACR Breast Density Category c: The breast tissue is heterogeneously dense, which may obscure small masses. FINDINGS: There are 2 adjacent masses at 6 o'clock in the left breast containing suspicious calcifications. There is a group of calcifications just anterior to the more anterior of the 2 masses which are clustered and demonstrate round morphology, spanning between 1 and 2 mm. The calcifications in the anterior left breast are vascular. No other suspicious findings. On physical exam, a lump is felt in the inferior left breast. Targeted ultrasound is performed, showing either 1 dumbbell-shaped mass or 2 adjacent masses in the left breast. The first of these masses is seen at 6 o'clock, 7 cm from the nipple measuring 1.4 x 1.6 x 2.1 cm, containing calcifications. Chest wall invasion is not excluded on these images. The more anterior of the 2 masses measures 1.5 x 1.7 x 1.7 cm, containing calcifications. There is a single abnormal lymph node in the left axilla. On the 1 into the lymph node, the cortex measures 2.8 mm. On the other in, the cortex measures up to 5 mm. This lymph node stands out compared to the remainder of the axillary lymph nodes. IMPRESSION: Either 2 adjacent masses or 1 complex dumbbell-shaped mass containing calcifications. There is a small group of calcifications just anterior to the 2 masses spanning between 1 and 2 mm. There is a single abnormal lymph node in the left axilla. There are vascular calcifications in the anterior left  breast. RECOMMENDATION: Recommend ultrasound-guided biopsy of the 2 adjacent masses in the left breast. Recommend stereotactic biopsy of the small group of calcifications just anterior to the masses. Recommend ultrasound-guided biopsy of the abnormal left axillary node. I have discussed the findings and recommendations with the patient. If applicable, a reminder letter will be sent to the patient regarding the next appointment. BI-RADS CATEGORY  5: Highly suggestive of malignancy. Electronically Signed   By: Dorise Bullion III M.D   On: 07/01/2019 14:34   MM  3D SCREEN BREAST BILATERAL  Result Date: 06/23/2019 CLINICAL DATA:  Screening. EXAM: DIGITAL SCREENING BILATERAL MAMMOGRAM WITH TOMO AND CAD COMPARISON:  Previous exam(s). ACR Breast Density Category b: There are scattered areas of fibroglandular density. FINDINGS: In the left breast, calcifications and a mass warrant further evaluation. In the right breast, no findings suspicious for malignancy. Images were processed with CAD. IMPRESSION: Further evaluation is suggested for calcifications and a mass in the left breast. RECOMMENDATION: Diagnostic mammogram and possibly ultrasound of the left breast. (Code:[id number]) The patient will be contacted regarding the findings, and additional imaging will be scheduled. BI-RADS CATEGORY  0: Incomplete. Need additional imaging evaluation and/or prior mammograms for comparison. Electronically Signed   By: Kristopher Oppenheim M.D.   On: 06/23/2019 14:53   [place] AXILLARY NODE CORE BIOPSY LEFT  Addendum Date: 07/10/2019   ADDENDUM REPORT: 07/10/2019 14:14 ADDENDUM: Pathology revealed: 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 This was found to be concordant by Dr. Peggye Fothergill. 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 This  was found to be concordant by Dr. Peggye Fothergill. METASTATIC CARCINOMA INVOLVING A LYMPH NODE of the Left axilla. This was found to be concordant by Dr. Peggye Fothergill. Pathology results were discussed with the patient in person by Dr. Hassan Rowan at the time of her stereotactic biopsy performed today. The patient reported doing well after the biopsies with tenderness at the sites. Post biopsy instructions and care were reviewed and questions were answered. The patient was encouraged to call [facility] for any additional concerns. The patient was referred to [facility] on July 17, 2019. [facility] for a Left breast stereotatic guided biopsy to evaluate extent of disease. Pathology results reported by Terie Purser, RN on 07/10/2019. Electronically Signed   By: Evangeline Dakin M.D.   On: 07/10/2019 14:14   Result Date: 07/10/2019 CLINICAL DATA:  75 year old with screening detected contiguous adjacent masses involving the LOWER LEFT breast at posterior depth associated with suspicious calcifications. Diagnostic workup also demonstrated a solitary abnormal LEFT axillary lymph node with focal cortical thickening up to 5 mm. She also has an indeterminate 2 mm group of calcifications in the LOWER LEFT breast anterior to the masses for which stereotactic tomosynthesis biopsy has been scheduled for tomorrow. EXAM: ULTRASOUND-GUIDED LEFT BREAST CORE NEEDLE BIOPSY x 2 ULTRASOUND-GUIDED CORE NEEDLE BIOPSY OF A LEFT AXILLARY LYMPH NODE COMPARISON:  Previous exam(s). FINDINGS: I met with the patient and we discussed the procedure of ultrasound-guided biopsy, including benefits and alternatives. We discussed the high likelihood of a successful procedure. We discussed the risks of the procedure, including infection, bleeding, tissue injury, clip migration, and inadequate  sampling. Informed written consent was given. The usual time-out protocol was performed immediately prior to the procedure. # 1) Lesion quadrant: LOWER OUTER QUADRANT, 5:30 o'clock position 7 cm from nipple, ANTERIOR mass: Initially, using sterile technique with chlorhexidine as skin antisepsis, 1% lidocaine and 1% lidocaine with epinephrine as local anesthetic, under direct ultrasound visualization, a 12 gauge Bard Marquee core needle device placed through an 11 gauge introducer needle was used to perform biopsy of the ANTERIOR mass at the 5:30 o'clock position approximately 7 cm from nipple using a lateral  approach. At the conclusion of the procedure a ribbon shaped tissue marker clip was deployed into the biopsy cavity. # 2) Lesion quadrant: LOWER OUTER QUADRANT, 5:30 o'clock position 7 cm from nipple, POSTERIOR mass: Using the same dermatotomy site as above, 1% lidocaine and 1% lidocaine with epinephrine as local anesthetic, under direct ultrasound visualization, a second 12 gauge Bard Marquee core needle device placed through an 11 gauge introducer needle was used perform biopsy of the POSTERIOR mass at the 5:30 o'clock position approximately 7 cm from the nipple using a LATERAL approach. At the conclusion of the procedure a coil shaped tissue marker clip was deployed into the biopsy cavity. # 3) LEFT axillary node: Using sterile technique with chlorhexidine as skin antisepsis, 1% lidocaine and 1% lidocaine with epinephrine as local anesthetic, under direct ultrasound visualization, a 14 gauge Bard Marquee core needle device placed through a 13 gauge introducer needle was used perform biopsy of the abnormal LEFT axillary lymph node with focal cortical thickening using an inferolateral approach. At the conclusion of the procedure a Q shaped tissue marker clip was deployed into the biopsy cavity. Follow up 2 view mammogram was performed and dictated separately. IMPRESSION: Ultrasound-guided biopsy of 2 adjacent  contiguous masses involving the LOWER OUTER QUADRANT of the LEFT breast and ultrasound-guided biopsy of a solitary abnormal LEFT axillary lymph node. No apparent complications. Electronically Signed: By: Evangeline Dakin M.D. On: 07/09/2019 14:14   MM CLIP PLACEMENT LEFT  Addendum Date: 07/11/2019   ADDENDUM REPORT: 07/11/2019 14:10 ADDENDUM: Pathology revealed FIBROCYSTIC CHANGES WITH CALCIFICATIONS of the LEFT breast, central. This was found to be concordant by Dr. Hassan Rowan. Pathology results were discussed with the patient by telephone. The patient reported doing well after the biopsy with tenderness at the site. Post biopsy instructions and care were reviewed and questions were answered. The patient was encouraged to call [facility] for any additional concerns. The patient has a recent diagnosis of LEFT breast cancer and was referred to [facility] at [facility] on July 16, 2018. Pathology results reported by Terie Purser, RN on 07/11/2019. Electronically Signed   By: Margarette Canada M.D.   On: 07/11/2019 14:10   Result Date: 07/11/2019 CLINICAL DATA:  74 year old female for tissue sampling of 0.2 cm group of calcifications within the central LEFT breast. EXAM: LEFT BREAST STEREOTACTIC CORE NEEDLE BIOPSY 3D DIAGNOSTIC LEFT MAMMOGRAM POST STEREOTACTIC BIOPSY COMPARISON:  Previous exams. FINDINGS: The patient and I discussed the procedure of stereotactic-guided biopsy including benefits and alternatives. We discussed the high likelihood of a successful procedure. We discussed the risks of the procedure including infection, bleeding, tissue injury, clip migration, and inadequate sampling. Informed written consent was given. The usual time out protocol was performed immediately prior to the procedure. Using sterile technique and 1% Lidocaine as local anesthetic, under stereotactic guidance, a 9 gauge vacuum assisted device was  used to perform core needle biopsy of the 0.2 cm group of calcifications within the central LEFT breast using a LATERAL approach. Specimen radiograph was performed showing calcifications. Specimens with calcifications are identified for pathology. At the conclusion of the procedure, 2 separate X shaped tissue marker clips were deployed into the biopsy cavity. The 2nd X shaped clip was placed as the 1st clip was not visualized on the immediate post clip image during the procedure. Follow-up 2-view mammogram was performed demonstrating 2 X shaped tissue marker clips in satisfactory position at the expected site  of biopsy within the LEFT breast. IMPRESSION: Stereotactic-guided biopsy of 0.2 cm group of calcifications within the central LEFT breast. No apparent complications. Satisfactory position of 2 X shaped tissue marker clips within the LEFT breast following stereotactic guided biopsy. Electronically Signed: By: Margarette Canada M.D. On: 07/10/2019 12:10   MM CLIP PLACEMENT LEFT  Result Date: 07/09/2019 CLINICAL DATA:  Confirmation clip placement after ultrasound-guided core needle biopsy of 2 adjacent contiguous masses involving the LOWER OUTER QUADRANT of the LEFT breast at POSTERIOR depth and ultrasound-guided core needle biopsy of a solitary abnormal LEFT axillary lymph node. EXAM: 2D AND TOMOSYNTHESIS DIAGNOSTIC LEFT MAMMOGRAM POST ULTRASOUND BIOPSY COMPARISON:  Previous exam(s). FINDINGS: Mammographic images were obtained following ultrasound guided biopsy of 2 adjacent contiguous masses involving the LOWER OUTER LEFT breast and ultrasound-guided biopsy of a solitary abnormal LEFT axillary lymph node. The ribbon shaped tissue marking clip is appropriately positioned within the biopsied ANTERIOR mass in the [place] at POSTERIOR depth. The coil shaped tissue marker clip is appropriately positioned within the biopsied POSTERIOR mass in the [place] at POSTERIOR depth. The ribbon clip and  the coil clip are separated by approximately 1.9 cm. The Q shaped tissue marker clip is appropriately positioned within the biopsied abnormal LEFT axillary lymph node. Expected post biopsy changes are present at each site without evidence of hematoma. IMPRESSION: 1. Appropriate positioning of the ribbon shaped tissue marking clip within the biopsied ANTERIOR mass in the [place] at POSTERIOR depth. 2. Appropriate positioning of the coil shaped tissue marker clip within the biopsied POSTERIOR mass in the LOWER OUTER QUADRANT at POSTERIOR depth. 3. Appropriate positioning of the Q shaped tissue marker clip within the biopsied abnormal LEFT axillary lymph node. Final Assessment: Post Procedure Mammograms for Marker Placement Electronically Signed   By: Evangeline Dakin M.D.   On: 07/09/2019 14:18   MM LT BREAST BX W LOC DEV 1ST LESION IMAGE BX SPEC STEREO GUIDE  Addendum Date: 07/11/2019   ADDENDUM REPORT: 07/11/2019 14:10 ADDENDUM: Pathology revealed FIBROCYSTIC CHANGES WITH CALCIFICATIONS of the LEFT breast, central. This was found to be concordant by Dr. Hassan Rowan. Pathology results were discussed with the patient by telephone. The patient reported doing well after the biopsy with tenderness at the site. Post biopsy instructions and care were reviewed and questions were answered. The patient was encouraged to call [facility] for any additional concerns. The patient has a recent diagnosis of LEFT breast cancer and was referred to [facility] on July 16, 2018. Pathology results reported by Terie Purser, RN on 07/11/2019. Electronically Signed   By: Margarette Canada M.D.   On: 07/11/2019 14:10   Result Date: 07/11/2019 CLINICAL DATA:  74 year old female for tissue sampling of 0.2 cm group of calcifications within the central LEFT breast. EXAM: LEFT BREAST STEREOTACTIC CORE NEEDLE BIOPSY 3D DIAGNOSTIC LEFT  MAMMOGRAM POST STEREOTACTIC BIOPSY COMPARISON:  Previous exams. FINDINGS: The patient and I discussed the procedure of stereotactic-guided biopsy including benefits and alternatives. We discussed the high likelihood of a successful procedure. We discussed the risks of the procedure including infection, bleeding, tissue injury, clip migration, and inadequate sampling. Informed written consent was given. The usual time out protocol was performed immediately prior to the procedure. Using sterile technique and 1% Lidocaine as local anesthetic, under stereotactic guidance, a 9 gauge vacuum assisted device was used to perform core needle biopsy of the 0.2  cm group of calcifications within the central LEFT breast using a LATERAL approach. Specimen radiograph was performed showing calcifications. Specimens with calcifications are identified for pathology. At the conclusion of the procedure, 2 separate X shaped tissue marker clips were deployed into the biopsy cavity. The 2nd X shaped clip was placed as the 1st clip was not visualized on the immediate post clip image during the procedure. Follow-up 2-view mammogram was performed demonstrating 2 X shaped tissue marker clips in satisfactory position at the expected site of biopsy within the LEFT breast. IMPRESSION: Stereotactic-guided biopsy of 0.2 cm group of calcifications within the central LEFT breast. No apparent complications. Satisfactory position of 2 X shaped tissue marker clips within the LEFT breast following stereotactic guided biopsy. Electronically Signed: By: Margarette Canada M.D. 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 This was found to be concordant by Dr. Peggye Fothergill. GRADE III INVASIVE DUCTAL CARCINOMA WITH  NECROSIS, INTERMEDIATE GRADE DUCTAL CARCINOMA IN SITU of the Left breast, LOQ 5:30 o'clock, 7cm from nipple (POSTERIOR MASS). This was found to be concordant by Dr. Peggye Fothergill. METASTATIC CARCINOMA INVOLVING A LYMPH NODE of the Left axilla. This was found to be concordant by Dr. Peggye Fothergill. Pathology results were discussed with the patient in person by Dr. Hassan Rowan at the time of her stereotactic biopsy performed today. The patient reported doing well after the biopsies with tenderness at the sites. Post biopsy instructions and care were reviewed and questions were answered. The patient was encouraged to call [facility] for any additional concerns. The patient was referred to [facility] at [facility] on July 17, 2019. The [facility] for a Left breast stereotatic guided biopsy to evaluate extent of disease. Pathology results reported by Terie Purser, RN on 07/10/2019. Electronically Signed   By: Evangeline Dakin M.D.   On: 07/10/2019 14:14   Result Date: 07/10/2019 CLINICAL DATA:  74 year old with screening detected contiguous adjacent masses involving the LOWER LEFT breast at posterior depth associated with suspicious calcifications. Diagnostic workup also demonstrated a solitary abnormal LEFT axillary lymph node with focal cortical thickening up to 5 mm. She also has an indeterminate 2 mm group of calcifications in the LOWER LEFT breast anterior to the masses for which stereotactic tomosynthesis biopsy has been scheduled for tomorrow. EXAM: ULTRASOUND-GUIDED LEFT BREAST CORE NEEDLE BIOPSY x 2 ULTRASOUND-GUIDED CORE NEEDLE BIOPSY OF A LEFT AXILLARY LYMPH NODE COMPARISON:  Previous exam(s). FINDINGS: I met with the patient and we discussed the procedure of ultrasound-guided biopsy, including benefits and alternatives. We discussed the high likelihood of a successful  procedure. We discussed the risks of the procedure, including infection, bleeding, tissue injury, clip migration, and inadequate sampling. Informed written consent was given. The usual time-out protocol was performed immediately prior to the procedure. # 1) Lesion quadrant: LOWER OUTER QUADRANT, 5:30 o'clock position 7 cm from nipple, ANTERIOR mass: Initially, using sterile technique with chlorhexidine as skin antisepsis, 1% lidocaine and 1% lidocaine with epinephrine as local anesthetic, under direct ultrasound visualization, a 12 gauge Bard Marquee core needle device placed through an 11 gauge introducer needle was used to perform biopsy of the  ANTERIOR mass at the 5:30 o'clock position approximately 7 cm from nipple using a lateral approach. At the conclusion of the procedure a ribbon shaped tissue marker clip was deployed into the biopsy cavity. # 2) Lesion quadrant: LOWER OUTER QUADRANT, 5:30 o'clock position 7 cm from nipple, POSTERIOR mass: Using the same dermatotomy site as above, 1% lidocaine and 1% lidocaine with epinephrine as local anesthetic, under direct ultrasound visualization, a second 12 gauge Bard Marquee core needle device placed through an 11 gauge introducer needle was used perform biopsy of the POSTERIOR mass at the 5:30 o'clock position approximately 7 cm from the nipple using a LATERAL approach. At the conclusion of the procedure a coil shaped tissue marker clip was deployed into the biopsy cavity. # 3) LEFT axillary node: Using sterile technique with chlorhexidine as skin antisepsis, 1% lidocaine and 1% lidocaine with epinephrine as local anesthetic, under direct ultrasound visualization, a 14 gauge Bard Marquee core needle device placed through a 13 gauge introducer needle was used perform biopsy of the abnormal LEFT axillary lymph node with focal cortical thickening using an inferolateral approach. At the conclusion of the procedure a Q shaped tissue marker clip was deployed into the  biopsy cavity. Follow up 2 view mammogram was performed and dictated separately. IMPRESSION: Ultrasound-guided biopsy of 2 adjacent contiguous masses involving the LOWER OUTER QUADRANT of the LEFT breast and ultrasound-guided biopsy of a solitary abnormal LEFT axillary lymph node. No apparent complications. Electronically Signed: By: Evangeline Dakin M.D. 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 This was found to be concordant by Dr. Peggye Fothergill. 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 This was found to be concordant by Dr. Peggye Fothergill. METASTATIC CARCINOMA INVOLVING A LYMPH NODE of the Left axilla. This was found to be concordant by Dr. Peggye Fothergill. Pathology results were discussed with the patient in person by Dr. Hassan Rowan at the time of her stereotactic biopsy performed today. The patient reported doing well after the biopsies with tenderness at the sites. Post biopsy instructions and care were reviewed and questions were answered. The patient was encouraged to call [facility] for any additional concerns. The patient was referred to [facility] on July 17, 2019. The [facility] for a Left breast stereotatic guided biopsy to evaluate extent of disease. Pathology results reported by Terie Purser, RN on 07/10/2019. Electronically Signed   By: Evangeline Dakin M.D.   On: 07/10/2019 14:14   Result Date: 07/10/2019 CLINICAL DATA:  74 year old with screening detected contiguous  adjacent masses involving the LOWER LEFT breast at posterior depth associated with suspicious calcifications. Diagnostic workup also demonstrated a solitary abnormal LEFT axillary lymph node with focal cortical thickening up to 5 mm. She also has an indeterminate 2 mm group of calcifications in the LOWER LEFT breast anterior to the masses for which stereotactic tomosynthesis biopsy has been scheduled for tomorrow. EXAM: ULTRASOUND-GUIDED LEFT BREAST CORE NEEDLE BIOPSY x 2 ULTRASOUND-GUIDED CORE NEEDLE BIOPSY OF A LEFT AXILLARY LYMPH NODE COMPARISON:  Previous  exam(s). FINDINGS: I met with the patient and we discussed the procedure of ultrasound-guided biopsy, including benefits and alternatives. We discussed the high likelihood of a successful procedure. We discussed the risks of the procedure, including infection, bleeding, tissue injury, clip migration, and inadequate sampling. Informed written consent was given. The usual time-out protocol was performed immediately prior to the procedure. # 1) Lesion quadrant: LOWER OUTER QUADRANT, 5:30 o'clock position 7 cm from nipple, ANTERIOR mass: Initially, using sterile technique with chlorhexidine as skin antisepsis, 1% lidocaine and 1% lidocaine with epinephrine as local anesthetic, under direct ultrasound visualization, a 12 gauge Bard Marquee core needle device placed through an 11 gauge introducer needle was used to perform biopsy of the ANTERIOR mass at the 5:30 o'clock position approximately 7 cm from nipple using a lateral approach. At the conclusion of the procedure a ribbon shaped tissue marker clip was deployed into the biopsy cavity. # 2) Lesion quadrant: LOWER OUTER QUADRANT, 5:30 o'clock position 7 cm from nipple, POSTERIOR mass: Using the same dermatotomy site as above, 1% lidocaine and 1% lidocaine with epinephrine as local anesthetic, under direct ultrasound visualization, a second 12 gauge Bard Marquee core needle device placed through an 11 gauge  introducer needle was used perform biopsy of the POSTERIOR mass at the 5:30 o'clock position approximately 7 cm from the nipple using a LATERAL approach. At the conclusion of the procedure a coil shaped tissue marker clip was deployed into the biopsy cavity. # 3) LEFT axillary node: Using sterile technique with chlorhexidine as skin antisepsis, 1% lidocaine and 1% lidocaine with epinephrine as local anesthetic, under direct ultrasound visualization, a 14 gauge Bard Marquee core needle device placed through a 13 gauge introducer needle was used perform biopsy of the abnormal LEFT axillary lymph node with focal cortical thickening using an inferolateral approach. At the conclusion of the procedure a Q shaped tissue marker clip was deployed into the biopsy cavity. Follow up 2 view mammogram was performed and dictated separately. IMPRESSION: Ultrasound-guided biopsy of 2 adjacent contiguous masses involving the LOWER OUTER QUADRANT of the LEFT breast and ultrasound-guided biopsy of a solitary abnormal LEFT axillary lymph node. No apparent complications. Electronically Signed: By: Evangeline Dakin M.D. On: 07/09/2019 14:14      IMPRESSION/PLAN: Left Breast Cancer   It was a pleasure meeting the patient today.  She anticipates neoadjuvant chemotherapy followed by surgery.  We discussed the risks, benefits, and side effects of radiotherapy. I recommend postoperative radiotherapy to the left breast or chest wall and regional nodes to reduce her risk of locoregional recurrence by 2/3.  This would also improve her life expectancy.  We discussed that I would give the patient a few weeks to heal following surgery before starting treatment planning. We spoke about acute effects including skin irritation and fatigue as well as much less common late effects including internal organ injury or irritation. We spoke about the latest technology that is used to minimize the risk of late effects for patients undergoing  radiotherapy to the breast or chest wall. No guarantees of treatment were given. The patient is enthusiastic about proceeding with treatment. I look forward to participating in the patient's care.  I will await her referral back to me for postoperative follow-up and eventual CT simulation/treatment planning.  On date of service, in total, I spent 40 minutes on this encounter.  Patient was seen in person.   __________________________________________   Eppie Gibson, MD   This document serves as a record of services personally  performed by Eppie Gibson, MD. It was created on her behalf by Wilburn Mylar, a trained medical scribe. The creation of this record is based on the scribe's personal observations and the provider's statements to them. This document has been checked and approved by the attending provider.

## 2019-07-17 ENCOUNTER — Other Ambulatory Visit: Payer: Self-pay | Admitting: *Deleted

## 2019-07-17 ENCOUNTER — Other Ambulatory Visit: Payer: Self-pay

## 2019-07-17 ENCOUNTER — Encounter: Payer: Self-pay | Admitting: Genetic Counselor

## 2019-07-17 ENCOUNTER — Ambulatory Visit
Admission: RE | Admit: 2019-07-17 | Discharge: 2019-07-17 | Disposition: A | Discharge status: Home / Self Care | Payer: Medicare Other | Source: Ambulatory Visit | Attending: Radiation Oncology | Admitting: Radiation Oncology

## 2019-07-17 ENCOUNTER — Ambulatory Visit: Payer: Medicare Other | Attending: Surgery | Admitting: Physical Therapy

## 2019-07-17 ENCOUNTER — Ambulatory Visit (HOSPITAL_BASED_OUTPATIENT_CLINIC_OR_DEPARTMENT_OTHER): Payer: Medicare Other | Admitting: Genetic Counselor

## 2019-07-17 ENCOUNTER — Encounter: Payer: Self-pay | Admitting: Physical Therapy

## 2019-07-17 ENCOUNTER — Encounter: Payer: Self-pay | Admitting: Oncology

## 2019-07-17 ENCOUNTER — Inpatient Hospital Stay: Payer: Medicare Other

## 2019-07-17 ENCOUNTER — Ambulatory Visit: Payer: Self-pay | Admitting: Surgery

## 2019-07-17 ENCOUNTER — Telehealth: Payer: Self-pay | Admitting: Oncology

## 2019-07-17 ENCOUNTER — Inpatient Hospital Stay: Payer: Medicare Other | Attending: Oncology | Admitting: Oncology

## 2019-07-17 VITALS — BP 165/79 | HR 94 | Temp 98.2°F | Resp 18 | Ht 60.0 in | Wt 185.2 lb

## 2019-07-17 DIAGNOSIS — Z800 Family history of malignant neoplasm of digestive organs: Secondary | ICD-10-CM

## 2019-07-17 DIAGNOSIS — Z79899 Other long term (current) drug therapy: Secondary | ICD-10-CM | POA: Diagnosis not present

## 2019-07-17 DIAGNOSIS — Z806 Family history of leukemia: Secondary | ICD-10-CM | POA: Insufficient documentation

## 2019-07-17 DIAGNOSIS — Z171 Estrogen receptor negative status [ER-]: Secondary | ICD-10-CM

## 2019-07-17 DIAGNOSIS — Z8249 Family history of ischemic heart disease and other diseases of the circulatory system: Secondary | ICD-10-CM | POA: Diagnosis not present

## 2019-07-17 DIAGNOSIS — Z7982 Long term (current) use of aspirin: Secondary | ICD-10-CM | POA: Diagnosis not present

## 2019-07-17 DIAGNOSIS — Z803 Family history of malignant neoplasm of breast: Secondary | ICD-10-CM | POA: Diagnosis not present

## 2019-07-17 DIAGNOSIS — Z5111 Encounter for antineoplastic chemotherapy: Secondary | ICD-10-CM | POA: Insufficient documentation

## 2019-07-17 DIAGNOSIS — C50512 Malignant neoplasm of lower-outer quadrant of left female breast: Secondary | ICD-10-CM

## 2019-07-17 DIAGNOSIS — Z8041 Family history of malignant neoplasm of ovary: Secondary | ICD-10-CM | POA: Insufficient documentation

## 2019-07-17 DIAGNOSIS — Z90722 Acquired absence of ovaries, bilateral: Secondary | ICD-10-CM | POA: Diagnosis not present

## 2019-07-17 DIAGNOSIS — C50912 Malignant neoplasm of unspecified site of left female breast: Secondary | ICD-10-CM | POA: Diagnosis not present

## 2019-07-17 DIAGNOSIS — C779 Secondary and unspecified malignant neoplasm of lymph node, unspecified: Secondary | ICD-10-CM | POA: Diagnosis not present

## 2019-07-17 DIAGNOSIS — Z5189 Encounter for other specified aftercare: Secondary | ICD-10-CM | POA: Insufficient documentation

## 2019-07-17 DIAGNOSIS — C773 Secondary and unspecified malignant neoplasm of axilla and upper limb lymph nodes: Secondary | ICD-10-CM | POA: Insufficient documentation

## 2019-07-17 DIAGNOSIS — Z9079 Acquired absence of other genital organ(s): Secondary | ICD-10-CM | POA: Insufficient documentation

## 2019-07-17 DIAGNOSIS — Z8051 Family history of malignant neoplasm of kidney: Secondary | ICD-10-CM | POA: Insufficient documentation

## 2019-07-17 DIAGNOSIS — Z833 Family history of diabetes mellitus: Secondary | ICD-10-CM | POA: Insufficient documentation

## 2019-07-17 DIAGNOSIS — R293 Abnormal posture: Secondary | ICD-10-CM | POA: Diagnosis not present

## 2019-07-17 DIAGNOSIS — Z808 Family history of malignant neoplasm of other organs or systems: Secondary | ICD-10-CM | POA: Diagnosis not present

## 2019-07-17 DIAGNOSIS — Z7952 Long term (current) use of systemic steroids: Secondary | ICD-10-CM | POA: Insufficient documentation

## 2019-07-17 DIAGNOSIS — Z791 Long term (current) use of non-steroidal anti-inflammatories (NSAID): Secondary | ICD-10-CM | POA: Insufficient documentation

## 2019-07-17 DIAGNOSIS — Z90710 Acquired absence of both cervix and uterus: Secondary | ICD-10-CM | POA: Insufficient documentation

## 2019-07-17 DIAGNOSIS — J45909 Unspecified asthma, uncomplicated: Secondary | ICD-10-CM | POA: Diagnosis not present

## 2019-07-17 DIAGNOSIS — Z8049 Family history of malignant neoplasm of other genital organs: Secondary | ICD-10-CM | POA: Diagnosis not present

## 2019-07-17 DIAGNOSIS — C50212 Malignant neoplasm of upper-inner quadrant of left female breast: Secondary | ICD-10-CM | POA: Diagnosis not present

## 2019-07-17 LAB — CMP (CANCER CENTER ONLY)
ALT: 18 U/L (ref 0–44)
AST: 21 U/L (ref 15–41)
Albumin: 3.9 g/dL (ref 3.5–5.0)
Alkaline Phosphatase: 97 U/L (ref 38–126)
Anion gap: 11 (ref 5–15)
BUN: 18 mg/dL (ref 8–23)
CO2: 25 mmol/L (ref 22–32)
Calcium: 8.9 mg/dL (ref 8.9–10.3)
Chloride: 107 mmol/L (ref 98–111)
Creatinine: 0.94 mg/dL (ref 0.44–1.00)
GFR, Est AFR Am: >60 mL/min (ref >60)
GFR, Estimated: >60 mL/min (ref >60)
Glucose, Bld: 95 mg/dL (ref 70–99)
Potassium: 3.9 mmol/L (ref 3.5–5.1)
Sodium: 143 mmol/L (ref 135–145)
Total Bilirubin: 0.4 mg/dL (ref 0.3–1.2)
Total Protein: 7.2 g/dL (ref 6.5–8.1)

## 2019-07-17 LAB — CBC WITH DIFFERENTIAL (CANCER CENTER ONLY)
Abs Immature Granulocytes: 0.01 10*3/uL (ref 0.00–0.07)
Basophils Absolute: 0 10*3/uL (ref 0.0–0.1)
Basophils Relative: 1 %
Eosinophils Absolute: 0.3 10*3/uL (ref 0.0–0.5)
Eosinophils Relative: 7 %
HCT: 39.9 % (ref 36.0–46.0)
Hemoglobin: 12.9 g/dL (ref 12.0–15.0)
Immature Granulocytes: 0 %
Lymphocytes Relative: 19 %
Lymphs Abs: 0.7 10*3/uL (ref 0.7–4.0)
MCH: 31 pg (ref 26.0–34.0)
MCHC: 32.3 g/dL (ref 30.0–36.0)
MCV: 95.9 fL (ref 80.0–100.0)
Monocytes Absolute: 0.4 10*3/uL (ref 0.1–1.0)
Monocytes Relative: 9 %
Neutro Abs: 2.5 10*3/uL (ref 1.7–7.7)
Neutrophils Relative %: 64 %
Platelet Count: 228 10*3/uL (ref 150–400)
RBC: 4.16 MIL/uL (ref 3.87–5.11)
RDW: 13.3 % (ref 11.5–15.5)
WBC Count: 3.9 10*3/uL — ABNORMAL LOW (ref 4.0–10.5)
nRBC: 0 % (ref 0.0–0.2)

## 2019-07-17 LAB — GENETIC SCREENING ORDER

## 2019-07-17 NOTE — H&P (View-Only) (Signed)
History of Present Illness Julie Thompson. Julie Ambrosius MD; 07/17/2019 2:41 PM) The patient is a 74 year old female who presents with breast cancer. PCP - Julie Thompson Reason for evaluation - Breast Cancer [facility]  This is a 74 year old female who presents after a recent screening mammogram that discovered a mass in the left breast. She underwent further work-up that revealed a bilobed, dumbell-shaped mass with calcifications with total diameter about 3.7 cm. She also had a single abnormal lymph node in the left axilla. 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 The lymph node was biopsied and revealed metastatic disease. She presents to [facility] for discussions. She is accompanied by her brother.    Pathology - IDC Triple negative, Ki67 60%  CLINICAL DATA: Screening.  EXAM: DIGITAL SCREENING BILATERAL MAMMOGRAM WITH TOMO AND CAD  COMPARISON: Previous exam(s).  ACR Breast Density Category b: There are scattered areas of fibroglandular density.  FINDINGS: In the left breast, calcifications and a mass warrant further evaluation. In the right breast, no findings suspicious for malignancy.  Images were processed with CAD.  IMPRESSION: Further evaluation is suggested for calcifications and a mass in the left breast.  RECOMMENDATION: Diagnostic mammogram and possibly ultrasound of the left breast. (Code:[id number])  The patient will be contacted regarding the findings, and additional imaging will be scheduled.  BI-RADS CATEGORY 0: Incomplete. Need additional imaging evaluation and/or prior mammograms for comparison.   Electronically Signed By: Julie Thompson M.D. On: 06/23/2019 14:53  CLINICAL DATA: The patient was called back for a left breast mass and calcifications.  EXAM: DIGITAL DIAGNOSTIC LEFT MAMMOGRAM WITH TOMO  ULTRASOUND LEFT BREAST  COMPARISON: Previous exam(s).  ACR Breast Density Category c: The breast tissue is  heterogeneously dense, which may obscure small masses.  FINDINGS: There are 2 adjacent masses at 6 o'clock in the left breast containing suspicious calcifications. There is a group of calcifications just anterior to the more anterior of the 2 masses which are clustered and demonstrate round morphology, spanning between 1 and 2 mm. The calcifications in the anterior left breast are vascular. No other suspicious findings.  On physical exam, a lump is felt in the inferior left breast.  Targeted ultrasound is performed, showing either 1 dumbbell-shaped mass or 2 adjacent masses in the left breast. The first of these masses is seen at 6 o'clock, 7 cm from the nipple measuring 1.4 x 1.6 x 2.1 cm, containing calcifications. Chest wall invasion is not excluded on these images.  The more anterior of the 2 masses measures 1.5 x 1.7 x 1.7 cm, containing calcifications.  There is a single abnormal lymph node in the left axilla. On the 1 into the lymph node, the cortex measures 2.8 mm. On the other in, the cortex measures up to 5 mm. This lymph node stands out compared to the remainder of the axillary lymph nodes.  IMPRESSION: Either 2 adjacent masses or 1 complex dumbbell-shaped mass containing calcifications. There is a small group of calcifications just anterior to the 2 masses spanning between 1 and 2 mm. There is a single abnormal lymph node in the left axilla. There are vascular calcifications in the anterior left breast.  RECOMMENDATION: Recommend ultrasound-guided biopsy of the 2 adjacent masses in the left breast. Recommend stereotactic biopsy of the small group of calcifications just anterior to the masses. Recommend ultrasound-guided biopsy of the abnormal left axillary node.  I have discussed the findings and recommendations with the patient. If  applicable, a reminder letter will be sent to the patient regarding the next appointment.  BI-RADS CATEGORY 5: Highly suggestive  of malignancy.   Electronically Signed By: Julie Thompson M.D On: 07/01/2019 14:34  CLINICAL DATA: 74 year old with screening detected contiguous adjacent masses involving the LOWER LEFT breast at posterior depth associated with suspicious calcifications. Diagnostic workup also demonstrated a solitary abnormal LEFT axillary lymph node with focal cortical thickening up to 5 mm.  She also has an indeterminate 2 mm group of calcifications in the LOWER LEFT breast anterior to the masses for which stereotactic tomosynthesis biopsy has been scheduled for tomorrow.  EXAM: ULTRASOUND-GUIDED LEFT BREAST CORE NEEDLE BIOPSY x 2  ULTRASOUND-GUIDED CORE NEEDLE BIOPSY OF A LEFT AXILLARY LYMPH NODE  COMPARISON: Previous exam(s).  FINDINGS: I met with the patient and we discussed the procedure of ultrasound-guided biopsy, including benefits and alternatives. We discussed the high likelihood of a successful procedure. We discussed the risks of the procedure, including infection, bleeding, tissue injury, clip migration, and inadequate sampling. Informed written consent was given. The usual time-out protocol was performed immediately prior to the procedure.  # 1) Lesion quadrant: LOWER OUTER QUADRANT, 5:30 o'clock position 7 cm from nipple, ANTERIOR mass:  Initially, using sterile technique with chlorhexidine as skin antisepsis, 1% lidocaine and 1% lidocaine with epinephrine as local anesthetic, under direct ultrasound visualization, a 12 gauge Bard Marquee core needle device placed through an 11 gauge introducer needle was used to perform biopsy of the ANTERIOR mass at the 5:30 o'clock position approximately 7 cm from nipple using a lateral approach. At the conclusion of the procedure a ribbon shaped tissue marker clip was deployed into the biopsy cavity.  # 2) Lesion quadrant: LOWER OUTER QUADRANT, 5:30 o'clock position 7 cm from nipple, POSTERIOR mass:  Using the same  dermatotomy site as above, 1% lidocaine and 1% lidocaine with epinephrine as local anesthetic, under direct ultrasound visualization, a second 12 gauge Bard Marquee core needle device placed through an 11 gauge introducer needle was used perform biopsy of the POSTERIOR mass at the 5:30 o'clock position approximately 7 cm from the nipple using a LATERAL approach. At the conclusion of the procedure a coil shaped tissue marker clip was deployed into the biopsy cavity.  # 3) LEFT axillary node:  Using sterile technique with chlorhexidine as skin antisepsis, 1% lidocaine and 1% lidocaine with epinephrine as local anesthetic, under direct ultrasound visualization, a 14 gauge Bard Marquee core needle device placed through a 13 gauge introducer needle was used perform biopsy of the abnormal LEFT axillary lymph node with focal cortical thickening using an inferolateral approach. At the conclusion of the procedure a Q shaped tissue marker clip was deployed into the biopsy cavity.  Follow up 2 view mammogram was performed and dictated separately.  IMPRESSION: Ultrasound-guided biopsy of 2 adjacent contiguous masses involving the LOWER OUTER QUADRANT of the LEFT breast and ultrasound-guided biopsy of a solitary abnormal LEFT axillary lymph node. No apparent complications.  Electronically Signed: By: Evangeline Dakin M.D. On: 07/09/2019 14:14    Allergies Rodman Key K. Lise Pincus, MD; 07/17/2019 2:41 PM) Sulfa Antibiotics Rash. Penicillins Hives.  Medication History Julie Thompson. Dagmar Adcox, MD; 07/17/2019 2:41 PM) Medications Reconciled Aspirin (81MG Tablet DR, Oral) Active. Multiple Vitamin (1 (one) Oral) Active. Omega 3 (1000MG Capsule, Oral) Active. clonazePAM (0.5MG Tablet, Oral) Active. Atorvastatin Calcium (20MG Tablet, Oral) Active. Levothyroxine Sodium (50MCG Tablet, Oral) Active. Nabumetone (500MG Tablet, Oral) Active. Meclizine HCl (25MG Tablet, Oral) Active.     Physical  Exam Rodman Key K. Jaydence Arnesen MD; 07/17/2019 2:43 PM)  The physical exam findings are as follows: Note:WDWN in NAD Eyes: Pupils equal, round; sclera anicteric HENT: Oral mucosa moist; good dentition Neck: No masses palpated, no thyromegaly Lungs: CTA bilaterally; normal respiratory effort Breasts: Extensive bruising of left breast. There is a mass extending from 7:00 to 5:00 of the left lower breast that is firm, at least 4 cm in dimension. No other palpable masses appreciated in the breasts or axillae bilaterally. CV: Regular rate and rhythm; no murmurs; extremities well-perfused with no edema Abd: +bowel sounds, soft, non-tender, no palpable organomegaly; no palpable hernias Skin: Warm, dry; no sign of jaundice Psychiatric - alert and oriented x 4; calm mood and affect    Assessment & Plan Rodman Key K. 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 The surgical procedure has been discussed with the patient. Potential risks, benefits, alternative treatments, and expected outcomes have been explained. All of the patient's questions at this time have been answered. The likelihood of reaching the patient's treatment goal is good. The patient understand the proposed surgical procedure and wishes to proceed. Note:After thorough discussion in the [facility], the treatment will be for port placement, neoadjuvant chemotherapy, followed by definitive surgery (lumpectomy vs. mastectomy depending on tumor response, along with targeted axillary lymph node dissection, followed by adjuvant radiation therapy.  Julie Thompson. Georgette Dover, MD, [facility]  General/ Trauma Surgery   07/17/2019 2:48 PM

## 2019-07-17 NOTE — H&P (Signed)
History of Present Illness Julie Thompson. Natoshia Souter MD; 07/17/2019 2:41 PM) The patient is a 74 year old female who presents with breast cancer. PCP - Carlena Sax Reason for evaluation - Breast Cancer MDC  This is a 74 year old female who presents after a recent screening mammogram that discovered a mass in the left breast. She underwent further work-up that revealed a bilobed, dumbell-shaped mass with calcifications with total diameter about 3.7 cm. She also had a single abnormal lymph node in the left axilla. 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 The lymph node was biopsied and revealed metastatic disease. She presents to [facility] for discussions. She is accompanied by her brother.    Pathology - IDC Triple negative, Ki67 60%  CLINICAL DATA: Screening.  EXAM: DIGITAL SCREENING BILATERAL MAMMOGRAM WITH TOMO AND CAD  COMPARISON: Previous exam(s).  ACR Breast Density Category b: There are scattered areas of fibroglandular density.  FINDINGS: In the left breast, calcifications and a mass warrant further evaluation. In the right breast, no findings suspicious for malignancy.  Images were processed with CAD.  IMPRESSION: Further evaluation is suggested for calcifications and a mass in the left breast.  RECOMMENDATION: Diagnostic mammogram and possibly ultrasound of the left breast. (Code:[id number])  The patient will be contacted regarding the findings, and additional imaging will be scheduled.  BI-RADS CATEGORY 0: Incomplete. Need additional imaging evaluation and/or prior mammograms for comparison.   Electronically Signed By: Kristopher Oppenheim M.D. On: 06/23/2019 14:53  CLINICAL DATA: The patient was called back for a left breast mass and calcifications.  EXAM: DIGITAL DIAGNOSTIC LEFT MAMMOGRAM WITH TOMO  ULTRASOUND LEFT BREAST  COMPARISON: Previous exam(s).  ACR Breast Density Category c: The breast tissue is  heterogeneously dense, which may obscure small masses.  FINDINGS: There are 2 adjacent masses at 6 o'clock in the left breast containing suspicious calcifications. There is a group of calcifications just anterior to the more anterior of the 2 masses which are clustered and demonstrate round morphology, spanning between 1 and 2 mm. The calcifications in the anterior left breast are vascular. No other suspicious findings.  On physical exam, a lump is felt in the inferior left breast.  Targeted ultrasound is performed, showing either 1 dumbbell-shaped mass or 2 adjacent masses in the left breast. The first of these masses is seen at 6 o'clock, 7 cm from the nipple measuring 1.4 x 1.6 x 2.1 cm, containing calcifications. Chest wall invasion is not excluded on these images.  The more anterior of the 2 masses measures 1.5 x 1.7 x 1.7 cm, containing calcifications.  There is a single abnormal lymph node in the left axilla. On the 1 into the lymph node, the cortex measures 2.8 mm. On the other in, the cortex measures up to 5 mm. This lymph node stands out compared to the remainder of the axillary lymph nodes.  IMPRESSION: Either 2 adjacent masses or 1 complex dumbbell-shaped mass containing calcifications. There is a small group of calcifications just anterior to the 2 masses spanning between 1 and 2 mm. There is a single abnormal lymph node in the left axilla. There are vascular calcifications in the anterior left breast.  RECOMMENDATION: Recommend ultrasound-guided biopsy of the 2 adjacent masses in the left breast. Recommend stereotactic biopsy of the small group of calcifications just anterior to the masses. Recommend ultrasound-guided biopsy of the abnormal left axillary node.  I have discussed the findings and recommendations with the patient. If  applicable, a reminder letter will be sent to the patient regarding the next appointment.  BI-RADS CATEGORY 5: Highly suggestive  of malignancy.   Electronically Signed By: Dorise Bullion III M.D On: 07/01/2019 14:34  CLINICAL DATA: 74 year old with screening detected contiguous adjacent masses involving the LOWER LEFT breast at posterior depth associated with suspicious calcifications. Diagnostic workup also demonstrated a solitary abnormal LEFT axillary lymph node with focal cortical thickening up to 5 mm.  She also has an indeterminate 2 mm group of calcifications in the LOWER LEFT breast anterior to the masses for which stereotactic tomosynthesis biopsy has been scheduled for tomorrow.  EXAM: ULTRASOUND-GUIDED LEFT BREAST CORE NEEDLE BIOPSY x 2  ULTRASOUND-GUIDED CORE NEEDLE BIOPSY OF A LEFT AXILLARY LYMPH NODE  COMPARISON: Previous exam(s).  FINDINGS: I met with the patient and we discussed the procedure of ultrasound-guided biopsy, including benefits and alternatives. We discussed the high likelihood of a successful procedure. We discussed the risks of the procedure, including infection, bleeding, tissue injury, clip migration, and inadequate sampling. Informed written consent was given. The usual time-out protocol was performed immediately prior to the procedure.  # 1) Lesion quadrant: LOWER OUTER QUADRANT, 5:30 o'clock position 7 cm from nipple, ANTERIOR mass:  Initially, using sterile technique with chlorhexidine as skin antisepsis, 1% lidocaine and 1% lidocaine with epinephrine as local anesthetic, under direct ultrasound visualization, a 12 gauge Bard Marquee core needle device placed through an 11 gauge introducer needle was used to perform biopsy of the ANTERIOR mass at the 5:30 o'clock position approximately 7 cm from nipple using a lateral approach. At the conclusion of the procedure a ribbon shaped tissue marker clip was deployed into the biopsy cavity.  # 2) Lesion quadrant: LOWER OUTER QUADRANT, 5:30 o'clock position 7 cm from nipple, POSTERIOR mass:  Using the same  dermatotomy site as above, 1% lidocaine and 1% lidocaine with epinephrine as local anesthetic, under direct ultrasound visualization, a second 12 gauge Bard Marquee core needle device placed through an 11 gauge introducer needle was used perform biopsy of the POSTERIOR mass at the 5:30 o'clock position approximately 7 cm from the nipple using a LATERAL approach. At the conclusion of the procedure a coil shaped tissue marker clip was deployed into the biopsy cavity.  # 3) LEFT axillary node:  Using sterile technique with chlorhexidine as skin antisepsis, 1% lidocaine and 1% lidocaine with epinephrine as local anesthetic, under direct ultrasound visualization, a 14 gauge Bard Marquee core needle device placed through a 13 gauge introducer needle was used perform biopsy of the abnormal LEFT axillary lymph node with focal cortical thickening using an inferolateral approach. At the conclusion of the procedure a Q shaped tissue marker clip was deployed into the biopsy cavity.  Follow up 2 view mammogram was performed and dictated separately.  IMPRESSION: Ultrasound-guided biopsy of 2 adjacent contiguous masses involving the LOWER OUTER QUADRANT of the LEFT breast and ultrasound-guided biopsy of a solitary abnormal LEFT axillary lymph node. No apparent complications.  Electronically Signed: By: Evangeline Dakin M.D. On: 07/09/2019 14:14    Allergies Rodman Key K. Adriann Thau, MD; 07/17/2019 2:41 PM) Sulfa Antibiotics Rash. Penicillins Hives.  Medication History Julie Thompson. Kayce Chismar, MD; 07/17/2019 2:41 PM) Medications Reconciled Aspirin (81MG Tablet DR, Oral) Active. Multiple Vitamin (1 (one) Oral) Active. Omega 3 (1000MG Capsule, Oral) Active. clonazePAM (0.5MG Tablet, Oral) Active. Atorvastatin Calcium (20MG Tablet, Oral) Active. Levothyroxine Sodium (50MCG Tablet, Oral) Active. Nabumetone (500MG Tablet, Oral) Active. Meclizine HCl (25MG Tablet, Oral) Active.     Physical  Exam Rodman Key K. Keslee Harrington MD; 07/17/2019 2:43 PM)  The physical exam findings are as follows: Note:WDWN in NAD Eyes: Pupils equal, round; sclera anicteric HENT: Oral mucosa moist; good dentition Neck: No masses palpated, no thyromegaly Lungs: CTA bilaterally; normal respiratory effort Breasts: Extensive bruising of left breast. There is a mass extending from 7:00 to 5:00 of the left lower breast that is firm, at least 4 cm in dimension. No other palpable masses appreciated in the breasts or axillae bilaterally. CV: Regular rate and rhythm; no murmurs; extremities well-perfused with no edema Abd: +bowel sounds, soft, non-tender, no palpable organomegaly; no palpable hernias Skin: Warm, dry; no sign of jaundice Psychiatric - alert and oriented x 4; calm mood and affect    Assessment & Plan Rodman Key K. 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 The surgical procedure has been discussed with the patient. Potential risks, benefits, alternative treatments, and expected outcomes have been explained. All of the patient's questions at this time have been answered. The likelihood of reaching the patient's treatment goal is good. The patient understand the proposed surgical procedure and wishes to proceed. Note:After thorough discussion in the Breast MDC, the treatment will be for port placement, neoadjuvant chemotherapy, followed by definitive surgery (lumpectomy vs. mastectomy depending on tumor response, along with targeted axillary lymph node dissection, followed by adjuvant radiation therapy.  Julie Thompson. Georgette Dover, MD, [facility] Surgery  General/ Trauma Surgery   07/17/2019 2:48 PM

## 2019-07-17 NOTE — Patient Instructions (Signed)

## 2019-07-17 NOTE — Progress Notes (Signed)
START ON PATHWAY REGIMEN - Breast     A cycle is every 14 days (cycles 1-4):     Doxorubicin      Cyclophosphamide      Pegfilgrastim-xxxx    A cycle is every 21 days (cycles 5-8):     Paclitaxel      Carboplatin   **Always confirm dose/schedule in your pharmacy ordering system**  Patient Characteristics: Preoperative or Nonsurgical Candidate (Clinical Staging), Neoadjuvant Therapy followed by Surgery, Invasive Disease, Chemotherapy, HER2 Negative/Unknown/Equivocal, ER Negative/Unknown, Platinum Therapy Indicated Therapeutic Status: Preoperative or Nonsurgical Candidate (Clinical Staging) AJCC M Category: cM0 AJCC Grade: G3 Breast Surgical Plan: Neoadjuvant Therapy followed by Surgery ER Status: Negative (-) AJCC 8 Stage Grouping: IIIB HER2 Status: Negative (-) AJCC T Category: cT2 AJCC N Category: cN1 PR Status: Negative (-) Type of Therapy: Platinum Therapy Indicated Intent of Therapy: Curative Intent, Discussed with Patient 

## 2019-07-17 NOTE — Telephone Encounter (Signed)
I talk with patient regarding schedule  

## 2019-07-17 NOTE — Progress Notes (Signed)
REFERRING PROVIDER: Chauncey Cruel, MD [address] [place],  [place] [address]  PRIMARY PROVIDER:  Marius Ditch, MD  PRIMARY REASON FOR VISIT:  1. Malignant neoplasm of lower-outer quadrant of left breast of female, estrogen receptor negative ([place])   2. Family history of peritoneal cancer   3. Family history of breast cancer   4. Family history of kidney cancer   5. Family history of colon cancer   6. Family history of leukemia   7. Family history of basal cell carcinoma      I connected with [name] on 07/17/2019 at 11:00 am EDT by Webex video conference and verified that I am speaking with the correct person using two identifiers. She was accompanied by her sister, Julie Thompson.  Patient location: [facility] Provider location: [place] [facility] office  HISTORY OF PRESENT ILLNESS:   Julie Thompson, a 74 y.o. female, was seen for a [place] cancer genetics consultation at the request of Dr. Jana Hakim due to a personal and family history of breast cancer, as well as a family history of peritoneal cancer.  Julie Thompson presents to clinic today to discuss the possibility of a hereditary predisposition to cancer, genetic testing, and to further clarify her future cancer risks, as well as potential cancer risks for family members.   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 The treatment plan includes neoadjuvant chemotherapy, surgery, and adjuvant radiation.   CANCER HISTORY:  Oncology History  Malignant neoplasm of lower-outer quadrant of left breast of female, estrogen receptor negative ([place])  07/11/2019 Initial Diagnosis   Malignant neoplasm of lower-outer quadrant of left breast of female, estrogen receptor negative ([place])   07/30/2019 -  Chemotherapy   The patient had DOXORUBICIN HCL CHEMO IV INJECTION 2 MG/ML, 60 mg/m2, Intravenous,  Once, 0 of 4 cycles PALONOSETRON HCL INJECTION  0.25 MG/5ML, 0.25 mg, Intravenous,  Once, 0 of 8 cycles PEGFILGRASTIM-JMDB 6 MG/0.6ML [place] SOSY, 6 mg, Subcutaneous,  Once, 0 of 4 cycles CARBOPLATIN CHEMO IV INFUSION ORDERABLE (BY [facility]), , Intravenous,  Once, 0 of 4 cycles CYCLOPHOSPHAMIDE CHEMO IV INFUSION ORDERABLE, 600 mg/m2, Intravenous,  Once, 0 of 4 cycles PACLITAXEL CHEMO IV INFUSION ORDERABLE (</= 80 MG/M2), 80 mg/m2, Intravenous,  Once, 0 of 4 cycles FOSAPREPITANT IV INFUSION 150 MG, 150 mg, Intravenous,  Once, 0 of 8 cycles  for chemotherapy treatment.       RISK FACTORS:  Menarche was at age 62.5.  First live birth at age 47.  OCP use for approximately 0 years.  Ovaries intact: no.  Hysterectomy: yes.  Menopausal status: postmenopausal.  HRT use: 12 years. Colonoscopy: yes; normal, per patient. Mammogram within the last year: yes. Number of breast biopsies: 2. Any excessive radiation exposure in the past: no  Past Medical History:  Diagnosis Date  . Asthma    occassionally has problems and uses an inhaler if needed  . Family history of basal cell carcinoma   . Family history of breast cancer   . Family history of colon cancer   . Family history of kidney cancer   . Family history of leukemia   . Family history of peritoneal cancer   . GERD (gastroesophageal reflux disease)   . H/O hiatal hernia   . IBS (irritable bowel syndrome)     Past Surgical History:  Procedure Laterality Date  . ABDOMINAL HYSTERECTOMY     age 28  .  DILATION AND CURETTAGE OF UTERUS     2 miscarriages age 57 & 44  . FLEXIBLE SIGMOIDOSCOPY N/A 10/22/2013   Procedure: FLEXIBLE SIGMOIDOSCOPY;  Surgeon: Garlan Fair, MD;  Location: [facility];  Service: Endoscopy;  Laterality: N/A;  . KNEE ARTHROSCOPY Left   . TONSILLECTOMY     age 22    Social History   Socioeconomic History  . Marital status: Married    Spouse name: Not on file  . Number of children: Not on file  . Years of education: Not on file  . Highest education level:  Not on file  Occupational History  . Not on file  Tobacco Use  . Smoking status: Never Smoker  . Smokeless tobacco: Never Used  Substance and Sexual Activity  . Alcohol use: No  . Drug use: No  . Sexual activity: Not on file  Other Topics Concern  . Not on file  Social History Narrative  . Not on file   Social Determinants of Health   Financial Resource Strain:   . Difficulty of Paying Living Expenses: Not on file  Food Insecurity:   . Worried About Charity fundraiser in the Last Year: Not on file  . Ran Out of Food in the Last Year: Not on file  Transportation Needs:   . Lack of Transportation (Medical): Not on file  . Lack of Transportation (Non-Medical): Not on file  Physical Activity:   . Days of Exercise per Week: Not on file  . Minutes of Exercise per Session: Not on file  Stress:   . Feeling of Stress : Not on file  Social Connections:   . Frequency of Communication with Friends and Family: Not on file  . Frequency of Social Gatherings with Friends and Family: Not on file  . Attends Religious Services: Not on file  . Active Member of Clubs or Organizations: Not on file  . Attends Archivist Meetings: Not on file  . Marital Status: Not on file     FAMILY HISTORY:  We obtained a detailed, 4-generation family history.  Significant diagnoses are listed below: Family History  Problem Relation Age of Onset  . Breast cancer Maternal Aunt        dx. in her 37s  . Cancer Mother 40       peritoneal cancer, death certificate lists ovarian cancer  . Colon cancer Paternal Uncle        dx. late 76s or early 69s  . Basal cell carcinoma Sister 26       a second diagnosed in her 39s  . Kidney cancer Maternal Grandmother 71  . Diabetes Paternal Grandmother   . Heart Problems Paternal Grandmother   . Leukemia Paternal Uncle        dx. early 67s   Julie Thompson has two sons, ages 74 and 73, who have not had cancer. She has one sister, Julie Thompson, who is 12 and has a  history of basal cell carcinoma, first diagnosed at age 54 and then again in her 17s.   Julie Thompson mother died at age 17 due to peritoneal cancer, and Julie Thompson notes that her mother's death certificate specified ovarian cancer. She had two maternal aunts and three maternal uncles. One of her aunts had breast cancer diagnosed in her 49s. Her maternal grandmother died from kidney cancer at age 63, and her maternal grandfather died at age 40 and did not have cancer. There are no other known diagnoses of cancer  on the maternal side of the family.  Julie Thompson father is currently living at age 43. She had seven paternal uncles and two paternal aunts. One of her uncles died from colon cancer in his late 4s or early 72s. Another uncle had leukemia diagnosed in his early 72s. Her paternal grandmother died at age 29 and had diabetes and heart problems, and her grandfather died at age 87 after being hit by a car. There are no other known diagnoses of cancer on the paternal side of the family.  Julie Thompson is unaware of previous family history of genetic testing for hereditary cancer risks. Her maternal ancestors are of [place] and [place] descent, and paternal ancestors are of [place] descent. There is no reported Ashkenazi Jewish ancestry. There is no known consanguinity.  GENETIC COUNSELING ASSESSMENT: Julie Thompson is a 74 y.o. female with a personal and family history of breast cancer, and a family history of peritoneal cancer, which is somewhat suggestive of a hereditary cancer syndrome and predisposition to cancer. We, therefore, discussed and recommended the following at today's visit.   DISCUSSION: We discussed that 5 - 10% of breast cancer is hereditary, with most cases associated with the BRCA genes.  There are other genes that can be associated with hereditary breast cancer syndromes.  These include ATM, CHEK2, PALB2, etc.  We discussed that testing is beneficial for several reasons including knowing  about other cancer risks, identifying potential screening and risk-reduction options that may be appropriate, and to understand if other family members could be at risk for cancer and allow them to undergo genetic testing.   We reviewed the characteristics, features and inheritance patterns of hereditary cancer syndromes. We also discussed genetic testing, including the appropriate family members to test, the process of testing, insurance coverage and turn-around-time for results. We discussed the implications of a negative, positive and/or variant of uncertain significant result. In order to get genetic test results in a timely manner so that Julie Thompson can use these genetic test results for surgical decisions, we recommended [name] pursue genetic testing for the [facility] Breast Cancer STAT panel. Once complete, we recommend [name] pursue reflex genetic testing to the Common Hereditary Cancers panel.   The STAT Breast cancer panel offered by [facility] includes sequencing and rearrangement analysis for the following 9 genes:  ATM, BRCA1, BRCA2, CDH1, CHEK2, PALB2, PTEN, STK11 and TP53.     The Common Hereditary Cancers Panel offered by [facility] includes sequencing and/or deletion duplication testing of the following 48 genes: APC, ATM, AXIN2, BARD1, BMPR1A, BRCA1, BRCA2, BRIP1, CDH1, CDK4, CDKN2A (p14ARF), CDKN2A (p16INK4a), CHEK2, CTNNA1, DICER1, EPCAM (Deletion/duplication testing only), GREM1 (promoter region deletion/duplication testing only), KIT, MEN1, MLH1, MSH2, MSH3, MSH6, MUTYH, NBN, NF1, NHTL1, PALB2, PDGFRA, PMS2, POLD1, POLE, PTEN, RAD50, RAD51C, RAD51D, RNF43, SDHB, SDHC, SDHD, SMAD4, SMARCA4. STK11, TP53, TSC1, TSC2, and VHL.  The following genes were evaluated for sequence changes only: SDHA and HOXB13 c.251G>A variant only.   Based on Julie Thompson's personal and family history of cancer, she meets medical criteria for genetic testing. Despite that she meets criteria, she may still have an  out of pocket cost.   PLAN: After considering the risks, benefits, and limitations, [name] provided informed consent to pursue genetic testing and the blood sample was sent to [facility] for analysis of the Breast Cancer STAT panel + Common Hereditary Cancers panel. Results should be available within approximately one-two weeks' time, at which point they will be disclosed by telephone  to Julie Thompson, as will any additional recommendations warranted by these results. Julie Thompson will receive a summary of her genetic counseling visit and a copy of her results once available. This information will also be available in Epic.   Julie Thompson questions were answered to her satisfaction today. Our contact information was provided should additional questions or concerns arise. Thank you for the referral and allowing [place] to share in the care of your patient.   Clint Guy, MS, [facility] Certified Genetic Counselor [email or web address] Phone: [phone]  The patient was seen for a total of 25 minutes in face-to-face genetic counseling.  This patient was discussed with Drs. Magrinat, Lindi Adie and/or Burr Medico who agrees with the above.    _______________________________________________________________________ For Office Staff:  Number of people involved in session: 1 Was an Intern/ student involved with case: no

## 2019-07-17 NOTE — Therapy (Signed)
[place], [place], [address] Phone: [phone]   Fax:  [phone]  Physical Therapy Evaluation  Patient Details  Name: Julie Thompson MRN: [id number] Date of Birth: [birth year] Referring Provider (PT): Dr. Donnie Mesa   Encounter Date: 07/17/2019  PT End of Session - 07/17/19 1519    Visit Number  1    Number of Visits  2    Date for PT Re-Evaluation  01/14/20    PT Start Time  0957    PT Stop Time  1013   Also saw pt from 1046-1056 for a total of 26 minutes   PT Time Calculation (min)  16 min    Activity Tolerance  Patient tolerated treatment well    Behavior During Therapy  [facility] for tasks assessed/performed       Past Medical History:  Diagnosis Date  . Asthma    occassionally has problems and uses an inhaler if needed  . Family history of basal cell carcinoma   . Family history of breast cancer   . Family history of colon cancer   . Family history of kidney cancer   . Family history of leukemia   . Family history of peritoneal cancer   . GERD (gastroesophageal reflux disease)   . H/O hiatal hernia   . IBS (irritable bowel syndrome)     Past Surgical History:  Procedure Laterality Date  . ABDOMINAL HYSTERECTOMY     age 59  . DILATION AND CURETTAGE OF UTERUS     2 miscarriages age 77 & 57  . FLEXIBLE SIGMOIDOSCOPY N/A 10/22/2013   Procedure: FLEXIBLE SIGMOIDOSCOPY;  Surgeon: Garlan Fair, MD;  Location: [facility];  Service: Endoscopy;  Laterality: N/A;  . KNEE ARTHROSCOPY Left   . TONSILLECTOMY     age 31    There were no vitals filed for this visit.   Subjective Assessment - 07/17/19 1506    Subjective  Patient reports she is here today to be seen by hre medical team for her newly diagnosed left breast cancer.    Patient is accompained by:  Family member    Pertinent History  Patient was diagnosed on 06/21/2019 with left grade III invasive ductal carcinoma breast cancer.  It is triple negative with a Ki67 of 60%. She has a biopsied positive axillary lymph node.    Patient Stated Goals  Reduce lymphedema risk and learn post op shoulder ROM HEP    Currently in Pain?  Yes    Pain Score  8     Pain Location  Shoulder    Pain Orientation  Right    Pain Descriptors / Indicators  Aching    Pain Type  Acute pain    Pain Onset  1 to 4 weeks ago    Pain Frequency  Intermittent    Aggravating Factors   Reaching out to the side and overhead    Pain Relieving Factors  Unknown         [facility] PT Assessment - 07/17/19 0001      Assessment   Medical Diagnosis  Left breast cancer    Referring Provider (PT)  Dr. Donnie Mesa    Onset Date/Surgical Date  06/21/19    Hand Dominance  Right    Prior Therapy  None      Precautions   Precautions  Other (comment)    Precaution Comments  active cancer      Restrictions   Weight Bearing Restrictions  No      Balance Screen   Has the patient fallen in the past 6 months  Yes    How many times?  1   Tripped on a step; no injuries   Has the patient had a decrease in activity level because of a fear of falling?   No    Is the patient reluctant to leave their home because of a fear of falling?   No      Home Environment   Living Environment  Private residence    Living Arrangements  Spouse/significant other;Parent   Husband and 61 y.o. father   Available Help at Discharge  Family      Prior Function   Level of [place]  Retired    Leisure  walks 3-4x/week for 30-45 min but has not been walking in the past 2 months      Cognition   Overall Cognitive Status  Within Functional Limits for tasks assessed      Posture/Postural Control   Posture/Postural Control  Postural limitations    Postural Limitations  Rounded Shoulders;Forward head      ROM / Strength   AROM / PROM / Strength  AROM;Strength      AROM   Overall AROM Comments  Cervical AROM is WNL    AROM Assessment Site   Shoulder    Right/Left Shoulder  Right;Left    Right Shoulder Extension  46 Degrees    Right Shoulder Flexion  110 Degrees    Right Shoulder ABduction  126 Degrees    Right Shoulder Internal Rotation  58 Degrees    Right Shoulder External Rotation  70 Degrees    Left Shoulder Extension  45 Degrees    Left Shoulder Flexion  125 Degrees    Left Shoulder ABduction  125 Degrees    Left Shoulder Internal Rotation  60 Degrees    Left Shoulder External Rotation  65 Degrees      Strength   Overall Strength  Within functional limits for tasks performed        LYMPHEDEMA/ONCOLOGY QUESTIONNAIRE - 07/17/19 1517      Type   Cancer Type  Left breast cancer      Lymphedema Assessments   Lymphedema Assessments  Upper extremities      Right Upper Extremity Lymphedema   10 cm Proximal to Olecranon Process  35.8 cm    Olecranon Process  26.3 cm    10 cm Proximal to Ulnar Styloid Process  24.3 cm    Just Proximal to Ulnar Styloid Process  16.5 cm    Across Hand at [employer]  17.1 cm    At [place] of 2nd Digit  5.7 cm      Left Upper Extremity Lymphedema   10 cm Proximal to Olecranon Process  37.7 cm    Olecranon Process  26.7 cm    10 cm Proximal to Ulnar Styloid Process  23.2 cm    Just Proximal to Ulnar Styloid Process  16.6 cm    Across Hand at [employer]  16.7 cm    At [place] of 2nd Digit  5.4 cm          Quick Dash - 07/17/19 0001    Open a tight or new jar  Mild difficulty    Do heavy household chores (wash walls, wash floors)  No difficulty    Carry a shopping bag or briefcase  No difficulty  Wash your back  No difficulty    Use a knife to cut food  No difficulty    Recreational activities in which you take some force or impact through your arm, shoulder, or hand (golf, hammering, tennis)  No difficulty    During the past week, to what extent has your arm, shoulder or hand problem interfered with your normal social activities with family, friends, neighbors, or  groups?  Not at all    During the past week, to what extent has your arm, shoulder or hand problem limited your work or other regular daily activities  Not at all    Arm, shoulder, or hand pain.  None    Tingling (pins and needles) in your arm, shoulder, or hand  None    Difficulty Sleeping  No difficulty    DASH Score  2.27 %        Objective measurements completed on examination: See above findings.      Patient was instructed today in a home exercise program today for post op shoulder range of motion. These included active assist shoulder flexion in sitting, scapular retraction, wall walking with shoulder abduction, and hands behind head external rotation.  She was encouraged to do these twice a day, holding 3 seconds and repeating 5 times when permitted by her physician.        PT Education - 07/17/19 1518    Education Details  Lymphedema risk reduction and post op shoulder ROM HEP    Person(s) Educated  Patient    Methods  Explanation;Demonstration;Handout    Comprehension  Returned demonstration;Verbalized understanding          PT Long Term Goals - 07/17/19 1535      PT LONG TERM GOAL #1   Title  Patient will demonstrate she has regained shoulder ROM and function post operatively compared to baselines.    Time  8    Period  Weeks    Status  New    Target Date  09/11/19      Breast Clinic Goals - 07/17/19 1535      Patient will be able to verbalize understanding of pertinent lymphedema risk reduction practices relevant to her diagnosis specifically related to skin care.   Time  1    Period  Days    Status  Achieved      Patient will be able to return demonstrate and/or verbalize understanding of the post-op home exercise program related to regaining shoulder range of motion.   Time  1    Period  Days    Status  Achieved      Patient will be able to verbalize understanding of the importance of attending the postoperative After Breast Cancer Class for  further lymphedema risk reduction education and therapeutic exercise.   Time  1    Period  Days    Status  Achieved            Plan - 07/17/19 1520    Clinical Impression Statement  Patient was diagnosed on 06/21/2019 with left grade III invasive ductal carcinoma breast cancer. It is triple negative with a Ki67 of 60%. She has a biopsied positive axillary lymph node. Her multidisciplinary medical team met prior to her assessments to determine a recommended treatment plan. She is planning to have neoadjuvant chemotherapy followed by a left lumpectomy and targeted axillary lymph node dissection, and then radiation ot her left breast and axilla. She will benefit from post op PT to regain shoulder  ROM and function and reduce lymphedema risk.    Stability/Clinical Decision Making  Stable/Uncomplicated    Clinical Decision Making  Low    Rehab Potential  Excellent    PT Frequency  --   Eval and 1 f/u visit   PT Treatment/Interventions  ADLs/Self Care Home Management;Therapeutic exercise;Patient/family education    PT Next Visit Plan  Will reassess 3-4 weeks post op to determine needs    PT Home Exercise Plan  Post op shoulder ROM HEP    Consulted and Agree with Plan of Care  Patient;Family member/caregiver    Family Member Consulted  sister       Patient will benefit from skilled therapeutic intervention in order to improve the following deficits and impairments:  Postural dysfunction, Decreased range of motion, Decreased knowledge of precautions, Impaired UE functional use, Pain  Visit Diagnosis: Malignant neoplasm of lower-outer quadrant of left breast of female, estrogen receptor negative ([place]) - Plan: PT plan of care cert/re-cert  Abnormal posture - Plan: PT plan of care cert/re-cert   Patient will follow up at outpatient cancer rehab 3-4 weeks following surgery.  If the patient requires physical therapy at that time, a specific plan will be dictated and sent to the referring  physician for approval. The patient was educated today on appropriate basic range of motion exercises to begin post operatively and the importance of attending the After Breast Cancer class following surgery.  Patient was educated today on lymphedema risk reduction practices as it pertains to recommendations that will benefit the patient immediately following surgery.  She verbalized good understanding.      Problem List Patient Active Problem List   Diagnosis Date Noted  . Family history of peritoneal cancer   . Family history of breast cancer   . Family history of kidney cancer   . Family history of colon cancer   . Family history of leukemia   . Family history of basal cell carcinoma   . Malignant neoplasm of lower-outer quadrant of left breast of female, estrogen receptor negative ([place]) 07/11/2019   Annia Friendly, PT 07/17/19 3:39 PM  [place], [place], [address] Phone: [phone]   Fax:  [phone]  Name: Julie Thompson MRN: [id number] Date of Birth: 10-Apr-1946

## 2019-07-22 ENCOUNTER — Other Ambulatory Visit: Payer: Self-pay

## 2019-07-22 ENCOUNTER — Encounter (HOSPITAL_BASED_OUTPATIENT_CLINIC_OR_DEPARTMENT_OTHER): Payer: Self-pay | Admitting: Surgery

## 2019-07-23 ENCOUNTER — Telehealth: Payer: Self-pay

## 2019-07-23 ENCOUNTER — Other Ambulatory Visit: Payer: Self-pay

## 2019-07-23 DIAGNOSIS — Z1379 Encounter for other screening for genetic and chromosomal anomalies: Secondary | ICD-10-CM | POA: Insufficient documentation

## 2019-07-23 MED ORDER — ONDANSETRON HCL 8 MG PO TABS: 8.0000 mg | ORAL_TABLET | Freq: Three times a day (TID) | ORAL | 0 refills | Status: DC | PRN

## 2019-07-23 MED ORDER — PROCHLORPERAZINE MALEATE 10 MG PO TABS: 10.0000 mg | ORAL_TABLET | Freq: Four times a day (QID) | ORAL | 0 refills | Status: DC | PRN

## 2019-07-23 MED ORDER — LIDOCAINE-PRILOCAINE 2.5-2.5 % EX CREA: 1.0000 "application " | TOPICAL_CREAM | CUTANEOUS | 0 refills | Status: DC | PRN

## 2019-07-23 NOTE — Telephone Encounter (Signed)
Nutrition Assessment  Reason for Assessment:  Pt attended Breast Clinic on 07/17/2019 and was given nutrition packet by nurse navigator  ASSESSMENT:  74 year old female with new diagnosis of triple negative left breast cancer.  Planning neoadjuvant chemotherapy. Past medical history includes IBS  Spoke with patient via phone to introduce self and service at [facility].  Patient reports that all of this has been overwhelming for her.  Reports that she has IBS and can't tolerate a lot of fruits and vegetables at one time.    Medications:  reviewed  Labs: reviewed  Anthropometrics:   Height: 60 inches Weight: 185 lb 3 oz BMI: 36   NUTRITION DIAGNOSIS: Food and nutrition related knowledge deficit related to new diagnosis of breast cancer as evidenced by no prior need for nutrition related information.  INTERVENTION:   Discussed briefly packet of information regarding nutritional tips for breast cancer patients.  Questions answered.  Teachback method used.  Contact information provided and patient knows to contact me with questions/concerns.    MONITORING, EVALUATION, and GOAL: Pt will consume a healthy plant based diet to maintain lean body mass throughout treatment.   Joseangel Nettleton B. Zenia Resides, [place], [place] Registered Dietitian [phone] (pager)

## 2019-07-24 ENCOUNTER — Ambulatory Visit: Payer: Self-pay | Admitting: Genetic Counselor

## 2019-07-24 ENCOUNTER — Encounter: Payer: Self-pay | Admitting: Genetic Counselor

## 2019-07-24 ENCOUNTER — Telehealth: Payer: Self-pay | Admitting: Genetic Counselor

## 2019-07-24 DIAGNOSIS — Z1379 Encounter for other screening for genetic and chromosomal anomalies: Secondary | ICD-10-CM

## 2019-07-24 NOTE — Telephone Encounter (Signed)
Revealed negative genetic testing.  Discussed that we do not know why she has breast cancer or why there is cancer in the family.  It could be due to a different gene that we are not testing, or our current technology may not be able detect something.  It is also possible that there is a hereditary cause for the cancer in the family that she did not inherit.  It will be important for her to keep in contact with genetics to keep up with whether additional testing may be appropriate in the future.   Genetic testing did identify a variant of uncertain significance in the MSH6 gene called c.680G>A. Her result is still considered normal at this time and should not impact her medical management.

## 2019-07-24 NOTE — Progress Notes (Addendum)
HPI:  Julie Thompson was previously seen in the [place] clinic due to a personal and family history of breast cancer as well as a family history of ovarian cancer, and concerns regarding a hereditary predisposition to cancer. Please refer to our prior cancer genetics clinic note for more information regarding our discussion, assessment and recommendations, at the time. Julie Thompson recent genetic test results were disclosed to her, as were recommendations warranted by these results. These results and recommendations are discussed in more detail below.  CANCER HISTORY:  Oncology History  Malignant neoplasm of lower-outer quadrant of left breast of female, estrogen receptor negative ([place])  07/11/2019 Initial Diagnosis   Malignant neoplasm of lower-outer quadrant of left breast of female, estrogen receptor negative ([place])   07/23/2019 Genetic Testing   Negative genetic testing:  No pathogenic variants detected on the [facility] Breast Cancer STAT panel or the Common Hereditary Cancers panel. A variant of uncertain significance was detected in the MSH6 gene called c.680G>A. The report date is 07/23/19.  The STAT Breast cancer panel offered by [facility] includes sequencing and rearrangement analysis for the following 9 genes:  ATM, BRCA1, BRCA2, CDH1, CHEK2, PALB2, PTEN, STK11 and TP53.  The Common Hereditary Cancers Panel offered by [facility] includes sequencing and/or deletion duplication testing of the following 48 genes: APC, ATM, AXIN2, BARD1, BMPR1A, BRCA1, BRCA2, BRIP1, CDH1, CDK4, CDKN2A (p14ARF), CDKN2A (p16INK4a), CHEK2, CTNNA1, DICER1, EPCAM (Deletion/duplication testing only), GREM1 (promoter region deletion/duplication testing only), KIT, MEN1, MLH1, MSH2, MSH3, MSH6, MUTYH, NBN, NF1, NHTL1, PALB2, PDGFRA, PMS2, POLD1, POLE, PTEN, RAD50, RAD51C, RAD51D, RNF43, SDHB, SDHC, SDHD, SMAD4, SMARCA4. STK11, TP53, TSC1, TSC2, and VHL.  The following genes were evaluated for sequence changes only: SDHA  and HOXB13 c.251G>A variant only.    07/30/2019 -  Chemotherapy   The patient had DOXORUBICIN HCL CHEMO IV INJECTION 2 MG/ML, 60 mg/m2, Intravenous,  Once, 0 of 4 cycles PALONOSETRON HCL INJECTION 0.25 MG/5ML, 0.25 mg, Intravenous,  Once, 0 of 8 cycles PEGFILGRASTIM-JMDB 6 MG/0.6ML [place] SOSY, 6 mg, Subcutaneous,  Once, 0 of 4 cycles CARBOPLATIN CHEMO IV INFUSION ORDERABLE (BY AUC), , Intravenous,  Once, 0 of 4 cycles CYCLOPHOSPHAMIDE CHEMO IV INFUSION ORDERABLE, 600 mg/m2, Intravenous,  Once, 0 of 4 cycles PACLITAXEL CHEMO IV INFUSION ORDERABLE (</= 80 MG/M2), 80 mg/m2, Intravenous,  Once, 0 of 4 cycles FOSAPREPITANT IV INFUSION 150 MG, 150 mg, Intravenous,  Once, 0 of 8 cycles  for chemotherapy treatment.      FAMILY HISTORY:  We obtained a detailed, 4-generation family history.  Significant diagnoses are listed below: Family History  Problem Relation Age of Onset  . Breast cancer Maternal Aunt        dx. in her 45s  . Cancer Mother 83       peritoneal cancer, death certificate lists ovarian cancer  . Colon cancer Paternal Uncle        dx. late 18s or early 67s  . Basal cell carcinoma Sister 36       a second diagnosed in her 18s  . Kidney cancer Maternal Grandmother 4  . Diabetes Paternal Grandmother   . Heart Problems Paternal Grandmother   . Leukemia Paternal Uncle        dx. early 9s   Julie Thompson has two sons, ages 89 and 75, who have not had cancer. She has one sister, Horris Latino, who is 55 and has a history of basal cell carcinoma, first diagnosed at age 51 and then again in her  78s.   Julie Thompson mother died at age 57 due to peritoneal cancer and Julie Thompson notes that her mother's death certificate specified ovarian cancer. She had two maternal aunts and three maternal uncles. One of her aunts had breast cancer diagnosed in her 19s. Her maternal grandmother died from kidney cancer at age 56, and her maternal grandfather died at age 69 and did not have cancer. There are no other  known diagnoses of cancer on the maternal side of the family.  Julie Thompson father is currently living at age 98. She had seven paternal uncles and two paternal aunts. One of her uncles died from colon cancer in his late 33s or early 2s. Another uncle had leukemia diagnosed in his early 53s. Her paternal grandmother died at age 67 and had diabetes and heart problems, and her grandfather died at age 93 after being hit by a car. There are no other known diagnoses of cancer on the paternal side of the family.  Julie Thompson is unaware of previous family history of genetic testing for hereditary cancer risks. Her maternal ancestors are of [place] and [place] descent, and paternal ancestors are of [place] descent. There is no reported Ashkenazi Jewish ancestry. There is no known consanguinity.  GENETIC TEST RESULTS: Genetic testing reported out on 07/23/19 through the [facility] Breast Cancer STAT panel and Common Hereditary Cancers panel. No pathogenic variants were detected.   The STAT Breast cancer panel offered by [facility] includes sequencing and rearrangement analysis for the following 9 genes:  ATM, BRCA1, BRCA2, CDH1, CHEK2, PALB2, PTEN, STK11 and TP53.  The Common Hereditary Cancers Panel offered by [facility] includes sequencing and/or deletion duplication testing of the following 48 genes: APC, ATM, AXIN2, BARD1, BMPR1A, BRCA1, BRCA2, BRIP1, CDH1, CDK4, CDKN2A (p14ARF), CDKN2A (p16INK4a), CHEK2, CTNNA1, DICER1, EPCAM (Deletion/duplication testing only), GREM1 (promoter region deletion/duplication testing only), KIT, MEN1, MLH1, MSH2, MSH3, MSH6, MUTYH, NBN, NF1, NHTL1, PALB2, PDGFRA, PMS2, POLD1, POLE, PTEN, RAD50, RAD51C, RAD51D, RNF43, SDHB, SDHC, SDHD, SMAD4, SMARCA4. STK11, TP53, TSC1, TSC2, and VHL.  The following genes were evaluated for sequence changes only: SDHA and HOXB13 c.251G>A variant only. The test report will be scanned into EPIC and located under the Molecular Pathology section of the Results  Review tab.  A portion of the result report is included below for reference.     We discussed with Julie Thompson that because current genetic testing is not perfect, it is possible there may be a gene mutation in one of these genes that current testing cannot detect, but that chance is small.  We also discussed, that there could be another gene that has not yet been discovered, or that we have not yet tested, that is responsible for the cancer diagnoses in the family. It is also possible there is a hereditary cause for the cancer in the family that Julie Thompson did not inherit and therefore was not identified in her testing.  Therefore, it is important to remain in touch with cancer genetics in the future so that we can continue to offer Julie Thompson the most up to date genetic testing.   Genetic testing did identify a variant of uncertain significance (VUS) in the MSH6 gene called c.680G>A.  At this time, it is unknown if this variant is associated with increased cancer risk or if this is a normal finding, but most variants such as this get reclassified to being inconsequential. It should not be used to make medical management decisions. With time, we suspect the lab  will determine the significance of this variant, if any. If we do learn more about it, we will try to contact Julie Thompson to discuss it further. However, it is important to stay in touch with [place] periodically and keep the address and phone number up to date.  UPDATE:  The MSH6 c.680G>A VUS was reclassified to "Likely Benign" on 08/23/2020. The change in variant classification was made as a result of re-review of the evidence in light of new variant interpretation guidelines and/or new information.   CANCER SCREENING RECOMMENDATIONS: Julie Thompson test result is considered negative (normal).  This means that we have not identified a hereditary cause for her personal and family history of cancer at this time. Most cancers happen by chance and this  negative test suggests that her personal and family of cancer may fall into this category.    While reassuring, this does not definitively rule out a hereditary predisposition to cancer. It is still possible that there could be genetic mutations that are undetectable by current technology. There could be genetic mutations in genes that have not been tested or identified to increase cancer risk.  Therefore, it is recommended she continue to follow the cancer management and screening guidelines provided by her oncology and primary healthcare provider.   An individual's cancer risk and medical management are not determined by genetic test results alone. Overall cancer risk assessment incorporates additional factors, including personal medical history, family history, and any available genetic information that may result in a personalized plan for cancer prevention and surveillance.  RECOMMENDATIONS FOR FAMILY MEMBERS:  Individuals in this family might be at some increased risk of developing cancer, over the general population risk, simply due to the family history of cancer.  We recommended women in this family have a yearly mammogram beginning at age 65, or 46 years younger than the earliest onset of cancer, an annual clinical breast exam, and perform monthly breast self-exams. Women in this family should also have a gynecological exam as recommended by their primary provider. All family members should have a colonoscopy by age 12.  It is also possible there is a hereditary cause for the cancer in Julie Thompson's family that she did not inherit and therefore was not identified in her.  Based on Julie Thompson's family history of ovarian cancer in her mother, we recommended Julie Thompson's sister, Horris Latino, have genetic counseling and testing. Ms. Dejarnett will let us know if we can be of any assistance in coordinating genetic counseling and/or testing for this family member.   FOLLOW-UP: Lastly, we discussed with Ms.  Flippo that cancer genetics is a rapidly advancing field and it is possible that new genetic tests will be appropriate for her and/or her family members in the future. We encouraged her to remain in contact with cancer genetics on an annual basis so we can update her personal and family histories and let her know of advances in cancer genetics that may benefit this family.   Our contact number was provided. Ms. Dobrowolski questions were answered to her satisfaction, and she knows she is welcome to call us at anytime with additional questions or concerns.   Clint Guy, MS, [facility] Certified Genetic Counselor [email or web address] Phone: [phone]

## 2019-07-25 ENCOUNTER — Inpatient Hospital Stay: Payer: Medicare Other

## 2019-07-25 ENCOUNTER — Other Ambulatory Visit: Payer: Self-pay

## 2019-07-25 ENCOUNTER — Ambulatory Visit (HOSPITAL_COMMUNITY)
Admission: RE | Admit: 2019-07-25 | Discharge: 2019-07-25 | Disposition: A | Discharge status: Home / Self Care | Payer: Medicare Other | Source: Ambulatory Visit | Attending: Oncology | Admitting: Oncology

## 2019-07-25 ENCOUNTER — Ambulatory Visit (HOSPITAL_BASED_OUTPATIENT_CLINIC_OR_DEPARTMENT_OTHER)
Admission: RE | Admit: 2019-07-25 | Discharge: 2019-07-25 | Disposition: A | Discharge status: Home / Self Care | Payer: Medicare Other | Source: Ambulatory Visit | Attending: Oncology | Admitting: Oncology

## 2019-07-25 ENCOUNTER — Telehealth: Payer: Self-pay | Admitting: *Deleted

## 2019-07-25 ENCOUNTER — Other Ambulatory Visit (HOSPITAL_COMMUNITY)
Admission: RE | Admit: 2019-07-25 | Discharge: 2019-07-25 | Disposition: A | Discharge status: Home / Self Care | Payer: Medicare Other | Source: Ambulatory Visit | Attending: Surgery | Admitting: Surgery

## 2019-07-25 ENCOUNTER — Other Ambulatory Visit: Payer: Self-pay | Admitting: *Deleted

## 2019-07-25 DIAGNOSIS — Z20822 Contact with and (suspected) exposure to covid-19: Secondary | ICD-10-CM | POA: Insufficient documentation

## 2019-07-25 DIAGNOSIS — C50512 Malignant neoplasm of lower-outer quadrant of left female breast: Secondary | ICD-10-CM | POA: Diagnosis not present

## 2019-07-25 DIAGNOSIS — Z01818 Encounter for other preprocedural examination: Secondary | ICD-10-CM | POA: Insufficient documentation

## 2019-07-25 DIAGNOSIS — I351 Nonrheumatic aortic (valve) insufficiency: Secondary | ICD-10-CM | POA: Diagnosis not present

## 2019-07-25 DIAGNOSIS — N6310 Unspecified lump in the right breast, unspecified quadrant: Secondary | ICD-10-CM | POA: Insufficient documentation

## 2019-07-25 DIAGNOSIS — Z171 Estrogen receptor negative status [ER-]: Secondary | ICD-10-CM | POA: Diagnosis not present

## 2019-07-25 DIAGNOSIS — C773 Secondary and unspecified malignant neoplasm of axilla and upper limb lymph nodes: Secondary | ICD-10-CM | POA: Insufficient documentation

## 2019-07-25 DIAGNOSIS — C50912 Malignant neoplasm of unspecified site of left female breast: Secondary | ICD-10-CM | POA: Diagnosis not present

## 2019-07-25 LAB — SARS CORONAVIRUS 2 (TAT 6-24 HRS): SARS Coronavirus 2: NEGATIVE

## 2019-07-25 MED ORDER — GADOBUTROL 1 MMOL/ML IV SOLN
8.0000 mL | Freq: Once | INTRAVENOUS | Status: AC | PRN
  Administered 2019-07-25: 8 mL via INTRAVENOUS

## 2019-07-25 MED ORDER — CHLORHEXIDINE GLUCONATE CLOTH 2 % EX PADS: 6.0000 | MEDICATED_PAD | Freq: Once | CUTANEOUS | Status: DC

## 2019-07-25 MED ORDER — LIDOCAINE-PRILOCAINE 2.5-2.5 % EX CREA: 1.0000 "application " | TOPICAL_CREAM | CUTANEOUS | 0 refills | Status: DC | PRN

## 2019-07-25 NOTE — Progress Notes (Signed)
[place]  [phone] Fax:(336) [phone]     ID: Eugene Zeiders DOB: April 04, 1946  MR#: [id number]  AST#:[id number]  Patient Care Team: Leeroy Cha, MD as PCP - General (Internal Medicine) Rockwell Germany, RN as Oncology Nurse Navigator Mauro Kaufmann, RN as Oncology Nurse Navigator Eppie Gibson, MD as Attending Physician (Radiation Oncology) Donnie Mesa, MD as Consulting Physician (General Surgery) Raeann Offner, Virgie Dad, MD as Consulting Physician (Oncology) Chauncey Cruel, MD OTHER MD:  CHIEF COMPLAINT: triple negative breast cancer  CURRENT TREATMENT: Neoadjuvant chemotherapy   INTERVAL HISTORY: Onika returns today for follow up of her triple negative breast cancer accompanied by her husband Butch..  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  She underwent breast MRI yesterday, 07/25/2019, which showed: bilobed biopsy-proven cancer in inferior left breast measures 3.4 cm; biopsy-proven metastatic left axillary lymph node is the only abnormal lymph node in the left axilla; two indeterminate 6 mm masses close to benign biopsy tract; scattered masses spanning approximately 6 cm in the central to lower-outer right breast; no abnormal right axillary lymph nodes. MRI-guided biopsies are recommended for the two 6 mm lateral left breast masses and the anterior-most lesion in the central to lower-outer right breast.  She also underwent echocardiogram yesterday, 07/25/2019, which showed an ejection fraction of 60-65%  She is scheduled to undergo port placement on 07/29/2019. She is also scheduled for bone scan and chest CT on 07/31/2019.   REVIEW OF SYSTEMS: Torin is doing very well overall though she is understandably anxious regarding the upcoming treatments.  In particular she has had no unusual headaches visual changes cough phlegm  production pleurisy or shortness of breath or change in bowel or bladder habits.  A detailed review of systems today was stable.   HISTORY OF CURRENT ILLNESS: From the original intake note:  Nataleigh Griffin had routine screening mammography on 06/21/2019 showing a possible abnormality in the left breast. She underwent left diagnostic mammography with tomography and left breast ultrasonography at [facility] on 07/01/2019 showing: breast density category C; either 2 adjacent masses or 1 complex dumbbell-shaped mass containing calcifications in the left breast at 6 o'clock, the largest of which measures 2.1 cm; chest wall invasion not excluded; 1-2 mm group of calcifications just anterior to the mass(es); single abnormal lymph node in the left axilla; vascular calcifications in anterior left breast.  Accordingly on 07/09/2019 she proceeded to biopsy of the left breast areas in question. The pathology from this procedure ([id number]) showed:  1. Left Breast, 5:30, posterior  - invasive ductal carcinoma with necrosis, grade 3  - Prognostic indicators significant for: estrogen receptor, 0% negative and progesterone receptor, 0% negative. Proliferation marker Ki67 at 60%. HER2 negative by immunohistochemistry (0). 2. Left Breast, 5:30, posterior  - invasive ductal carcinoma with necrosis, grade 3  - ductal carcinoma in situ, intermediate grade 3. Left Axilla, lymph node (1)  - metastatic carcinoma involving a lymph node  And additional biopsy of the anterior vascular calcifications in the left breast was performed on 07/10/2019. Pathology [id number]) showed: fibrocystic changes with calcifications; no malignancy identified.  This was felt to be concordant.  The patient's subsequent history is as detailed below.   PAST MEDICAL HISTORY: Past Medical History:  Diagnosis Date  . Asthma    no problems now  . Family history of basal cell carcinoma   . Family history of breast cancer   .  Family  history of colon cancer   . Family history of kidney cancer   . Family history of leukemia   . Family history of peritoneal cancer   . GERD (gastroesophageal reflux disease)   . H/O hiatal hernia   . Hyperlipidemia   . Hypothyroidism   . IBS (irritable bowel syndrome)     PAST SURGICAL HISTORY: Past Surgical History:  Procedure Laterality Date  . ABDOMINAL HYSTERECTOMY     age 43  . BREAST SURGERY    . DILATION AND CURETTAGE OF UTERUS     2 miscarriages age 74 & 3  . FLEXIBLE SIGMOIDOSCOPY N/A 10/22/2013   Procedure: FLEXIBLE SIGMOIDOSCOPY;  Surgeon: Garlan Fair, MD;  Location: [facility];  Service: Endoscopy;  Laterality: N/A;  . KNEE ARTHROSCOPY Left   . TONSILLECTOMY     age 66    FAMILY HISTORY: Family History  Problem Relation Age of Onset  . Breast cancer Maternal Aunt        dx. in her 68s  . Cancer Mother 77       peritoneal cancer, death certificate lists ovarian cancer  . Colon cancer Paternal Uncle        dx. late 72s or early 53s  . Basal cell carcinoma Sister 28       a second diagnosed in her 70s  . Kidney cancer Maternal Grandmother 45  . Diabetes Paternal Grandmother   . Heart Problems Paternal Grandmother   . Leukemia Paternal Uncle        dx. early 55s   Patient's father is currently living at age 74 as of 08-16-19. Patient's mother died from ovarian cancer at age 74. She was diagnosed at age 1. The patient reports breast cancer in a maternal aunt, diagnosed in her 79's. The patient also noted colon cancer in a paternal uncle and kidney cancer in her maternal grandmother.  She has 1 sister.   GYNECOLOGIC HISTORY:  No LMP recorded. Patient has had a hysterectomy. Menarche: [age] years old Age at first live birth: 74 years old [place] P 2 LMP age 86 (with hysterectomy) Contraceptive: never used HRT used for 10-12 years  Hysterectomy? Yes, age 29 BSO? 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  Husband [name] is a retired Psychologist, prison and probation services. She lives at home with husband Butch. She has two children from a prior marriage. Son [name], age 62, works as a Games developer in [place], [place]. Son [name], age 61, works as a Programme researcher, broadcasting/film/video in [place], [place]. The patient has 4 grandchildren and 4 great-grandchildren. She attends [employer].    ADVANCED DIRECTIVES: In the absence of any documentation to the contrary, the patient's spouse is their HCPOA.    HEALTH MAINTENANCE: Social History   Tobacco Use  . Smoking status: Never Smoker  . Smokeless tobacco: Never Used  Substance Use Topics  . Alcohol use: No  . Drug use: No     Colonoscopy: 09/2013, with CT virtual 11/2013  PAP: none on file, s/p hysterectomy  Bone density: 04/2001, -0.5   Allergies  Allergen Reactions  . Sulfa Antibiotics Rash  . Penicillins Hives    Current Outpatient Medications  Medication Sig Dispense Refill  . aspirin EC 81 MG tablet Take 81 mg by mouth daily.    Marland Kitchen atorvastatin (LIPITOR) 20 MG tablet Take 20 mg by mouth daily.    . Cholecalciferol (VITAMIN D) 2000 UNITS  tablet Take 2,000 Units by mouth daily.    Marland Kitchen co-enzyme Q-10 30 MG capsule Take 20 mg by mouth 3 (three) times daily.    Marland Kitchen dexamethasone (DECADRON) 4 MG tablet Take 2 tablets by mouth daily starting the day after Carboplatin and Cytoxan x 3 days. Take with food. 30 tablet 1  . esomeprazole (NEXIUM) 20 MG capsule Take 20 mg by mouth daily at 12 noon.    . famotidine (PEPCID) 20 MG tablet Take 40 mg by mouth 2 (two) times daily.    Marland Kitchen FIBER PO Take 1 tablet by mouth daily.    Marland Kitchen levothyroxine (SYNTHROID) 50 MCG tablet Take 50 mcg by mouth daily.    Marland Kitchen lidocaine-prilocaine (EMLA) cream Apply 1 application topically as needed. 30 g 0  . lidocaine-prilocaine (EMLA) cream Apply to affected area once 30 g 3  . loratadine (CLARITIN) 10 MG tablet Take 10 mg by mouth daily.    Marland Kitchen loratadine (CLARITIN) 10  MG tablet Take 1 tablet (10 mg total) by mouth daily. 60 tablet 1  . LORazepam (ATIVAN) 0.5 MG tablet Take 1 tablet (0.5 mg total) by mouth at bedtime as needed (Nausea or vomiting). 30 tablet 0  . Multiple Vitamin (MULTIVITAMIN WITH MINERALS) TABS tablet Take 1 tablet by mouth daily.    . nabumetone (RELAFEN) 500 MG tablet Take 500 mg by mouth 2 (two) times daily as needed.    . Omega-3 Fatty Acids (FISH OIL) 1200 MG CAPS Take 1,200 mg by mouth daily.    . ondansetron (ZOFRAN) 8 MG tablet Take 1 tablet (8 mg total) by mouth every 8 (eight) hours as needed for nausea or vomiting. 20 tablet 0  . prochlorperazine (COMPAZINE) 10 MG tablet Take 1 tablet (10 mg total) by mouth every 6 (six) hours as needed for nausea or vomiting. 30 tablet 0  . prochlorperazine (COMPAZINE) 10 MG tablet Take 1 tablet (10 mg total) by mouth every 6 (six) hours as needed (Nausea or vomiting). 30 tablet 1   No current facility-administered medications for this visit.   Facility-Administered Medications Ordered in Other Visits  Medication Dose Route Frequency Provider Last Rate Last Admin  . Chlorhexidine Gluconate Cloth 2 % PADS 6 each  6 each Topical Once Donnie Mesa, MD       And  . Chlorhexidine Gluconate Cloth 2 % PADS 6 each  6 each Topical Once Donnie Mesa, MD        OBJECTIVE: Middle-aged white woman who appears stated age  29:   07/26/19 0936  BP: (!) 161/81  Pulse: 74  Resp: 17  Temp: 97.8 F (36.6 C)  SpO2: 99%     Body mass index is 36.58 kg/m.   Wt Readings from Last 3 Encounters:  07/26/19 187 lb 4.8 oz (85 kg)  07/17/19 185 lb 3.2 oz (84 kg)      ECOG FS:1 - Symptomatic but completely ambulatory  Sclerae unicteric, EOMs intact Wearing a mask No cervical or supraclavicular adenopathy Lungs no rales or rhonchi Heart regular rate and rhythm Abd soft, nontender, positive bowel sounds MSK no focal spinal tenderness, no upper extremity lymphedema Neuro: nonfocal, well oriented,  appropriate affect Breasts: The right breast is benign.  In the inferior aspect of the left breast there is a palpable mass which is movable, hard, nontender, not associated with skin or nipple changes.  Both axillae are benign.   LAB RESULTS:  CMP     Component Value Date/Time   NA 143 07/17/2019  0823   K 3.9 07/17/2019 0823   CL 107 07/17/2019 0823   CO2 25 07/17/2019 0823   GLUCOSE 95 07/17/2019 0823   BUN 18 07/17/2019 0823   CREATININE 0.94 07/17/2019 0823   CALCIUM 8.9 07/17/2019 0823   PROT 7.2 07/17/2019 0823   ALBUMIN 3.9 07/17/2019 0823   AST 21 07/17/2019 0823   ALT 18 07/17/2019 0823   ALKPHOS 97 07/17/2019 0823   BILITOT 0.4 07/17/2019 0823   GFRNONAA >60 07/17/2019 0823   GFRAA >60 07/17/2019 0823    No results found for: TOTALPROTELP, ALBUMINELP, A1GS, A2GS, BETS, BETA2SER, GAMS, MSPIKE, SPEI  Lab Results  Component Value Date   WBC 3.9 (L) 07/17/2019   NEUTROABS 2.5 07/17/2019   HGB 12.9 07/17/2019   HCT 39.9 07/17/2019   MCV 95.9 07/17/2019   PLT 228 07/17/2019    No results found for: LABCA2  No components found for: GUYQIH474  No results for input(s): INR in the last 168 hours.  No results found for: LABCA2  No results found for: QVZ563  No results found for: OVF643  No results found for: PIR518  No results found for: CA2729  No components found for: HGQUANT  No results found for: CEA1 / No results found for: CEA1   No results found for: AFPTUMOR  No results found for: CHROMOGRNA  No results found for: KPAFRELGTCHN, LAMBDASER, KAPLAMBRATIO (kappa/lambda light chains)  No results found for: HGBA, HGBA2QUANT, HGBFQUANT, HGBSQUAN (Hemoglobinopathy evaluation)   No results found for: LDH  No results found for: IRON, TIBC, IRONPCTSAT (Iron and TIBC)  No results found for: FERRITIN  Urinalysis No results found for: COLORURINE, APPEARANCEUR, LABSPEC, PHURINE, GLUCOSEU, HGBUR, BILIRUBINUR, KETONESUR, PROTEINUR, UROBILINOGEN,  NITRITE, LEUKOCYTESUR   STUDIES: MR BREAST BILATERAL W WO CONTRAST  Result Date: 07/25/2019 CLINICAL DATA:  74 year old female with recent diagnosis of grade 3 invasive ductal carcinoma involving the inferior left breast and a metastatic left axillary lymph node on ultrasound guided biopsy from 07/09/2019. She had a stereotactic biopsy of a small group of calcifications in the left breast with benign pathology (fibrocystic changes) on 07/10/2019. She presents for MRI to determine the extent of disease. LABS:  Creatinine of 0.94 mg/dL and GFR of greater than 60 on 07/17/2019. EXAM: BILATERAL BREAST MRI WITH AND WITHOUT CONTRAST TECHNIQUE: Multiplanar, multisequence MR images of both breasts were obtained prior to and following the intravenous administration of 8 ml of Gadavist Three-dimensional MR images were rendered by post-processing of the original MR data on an independent workstation. The three-dimensional MR images were interpreted, and findings are reported in the following complete MRI report for this study. Three dimensional images were evaluated at the independent DynaCad workstation COMPARISON:  No prior MRI available for comparison. Correlation made with prior mammograms and ultrasounds. FINDINGS: Breast composition: c. Heterogeneous fibroglandular tissue. Background parenchymal enhancement: Moderate. Right breast: In the central right breast into the lower outer quadrant, there are a few subcentimeter scattered enhancing masses in a segmental distribution spanning approximately 6 cm in the anterior to posterior dimension, best represented in the MIP images. One of the anterior-most lesions is 4 mm, and is seen in series 10701, image 163 and demonstrates mixed kinetics. One of the posterior most lesions is seen in the inferior central right breast in series 10701, image 156. This lesion measures 7 mm and demonstrates predominantly persistent kinetics. Left breast: The biopsy-proven malignancy in  the inferior left breast of (series 10701, image 171) is one contiguous bilobed mass with central  necrosis measuring 3.4 x 2.4 x 2.0 cm. Superior, anterior and slightly medial to the mass, there is a rim enhancing 1.4 cm lesion associated with susceptibility artifact, compatible with the biopsy marking clip from the benign stereotactic biopsy. There is patchy discontinuous linear enhancement extending from the lateral skin surface towards the biopsy site, very likely representing the biopsy tract (series 10701, between images 151 through 158). There are two 6 mm masses in the lateral left breast, middle and posterior depth, which are both superior and lateral to the known cancer. These are near, but not definitely part of the suspected biopsy tract. Both of these masses can be seen together in series 10701, image 151. There is an elongated 7 mm enhancing mass with washout kinetics in the lateral posterior left breast which corresponds with a small intramammary lymph node on the mammogram. Lymph nodes: No abnormal appearing lymph nodes in the right axilla. A lymph node in the left axilla with associated susceptibility from the biopsy marking clip is consistent with the biopsy proven metastatic lymph node. No other abnormal left axillary lymph nodes are identified. Ancillary findings:  None. IMPRESSION: 1. The bilobed biopsy-proven cancer in the inferior left breast measures 3.4 cm. 2. The biopsy proven metastatic left axillary lymph node is the only abnormal lymph node in the left axilla. 2. Discontinuous linear enhancement from the lateral left breast toward the benign biopsy site of calcifications likely represents the biopsy tract. However, there are two 6 mm masses close to the biopsy tract but are not definitely part of it. These are indeterminate. 2. There are scattered masses spanning approximately 6 cm in the central to lower outer right breast in a segmental distribution. 4.  No abnormal right axillary lymph  nodes are identified. RECOMMENDATION: 1. MRI guided biopsy is recommended for the two 6 mm masses in the lateral left breast if breast conservation is being considered. 2. MRI guided biopsy is recommended for the anterior and posterior margin of the scattered segmentally distributed masses in the central to lower outer right breast. Though the anterior lesion is only 4 mm (image 163), the kinetics of this lesion is the most suspicious and if possible, this should be the target for the anterior margin. BI-RADS CATEGORY  4: Suspicious. Electronically Signed   By: Ammie Ferrier M.D.   On: 07/25/2019 12:04   US BREAST LTD UNI LEFT INC AXILLA  Result Date: 07/01/2019 CLINICAL DATA:  The patient was called back for a left breast mass and calcifications. EXAM: DIGITAL DIAGNOSTIC LEFT MAMMOGRAM WITH TOMO ULTRASOUND LEFT BREAST COMPARISON:  Previous exam(s). ACR Breast Density Category c: The breast tissue is heterogeneously dense, which may obscure small masses. FINDINGS: There are 2 adjacent masses at 6 o'clock in the left breast containing suspicious calcifications. There is a group of calcifications just anterior to the more anterior of the 2 masses which are clustered and demonstrate round morphology, spanning between 1 and 2 mm. The calcifications in the anterior left breast are vascular. No other suspicious findings. On physical exam, a lump is felt in the inferior left breast. Targeted ultrasound is performed, showing either 1 dumbbell-shaped mass or 2 adjacent masses in the left breast. The first of these masses is seen at 6 o'clock, 7 cm from the nipple measuring 1.4 x 1.6 x 2.1 cm, containing calcifications. Chest wall invasion is not excluded on these images. The more anterior of the 2 masses measures 1.5 x 1.7 x 1.7 cm, containing calcifications. There is a single  abnormal lymph node in the left axilla. On the 1 into the lymph node, the cortex measures 2.8 mm. On the other in, the cortex measures up to  5 mm. This lymph node stands out compared to the remainder of the axillary lymph nodes. IMPRESSION: Either 2 adjacent masses or 1 complex dumbbell-shaped mass containing calcifications. There is a small group of calcifications just anterior to the 2 masses spanning between 1 and 2 mm. There is a single abnormal lymph node in the left axilla. There are vascular calcifications in the anterior left breast. RECOMMENDATION: Recommend ultrasound-guided biopsy of the 2 adjacent masses in the left breast. Recommend stereotactic biopsy of the small group of calcifications just anterior to the masses. Recommend ultrasound-guided biopsy of the abnormal left axillary node. I have discussed the findings and recommendations with the patient. If applicable, a reminder letter will be sent to the patient regarding the next appointment. BI-RADS CATEGORY  5: Highly suggestive of malignancy. Electronically Signed   By: Dorise Bullion III M.D   On: 07/01/2019 14:34   MM DIAG BREAST TOMO UNI LEFT  Result Date: 07/01/2019 CLINICAL DATA:  The patient was called back for a left breast mass and calcifications. EXAM: DIGITAL DIAGNOSTIC LEFT MAMMOGRAM WITH TOMO ULTRASOUND LEFT BREAST COMPARISON:  Previous exam(s). ACR Breast Density Category c: The breast tissue is heterogeneously dense, which may obscure small masses. FINDINGS: There are 2 adjacent masses at 6 o'clock in the left breast containing suspicious calcifications. There is a group of calcifications just anterior to the more anterior of the 2 masses which are clustered and demonstrate round morphology, spanning between 1 and 2 mm. The calcifications in the anterior left breast are vascular. No other suspicious findings. On physical exam, a lump is felt in the inferior left breast. Targeted ultrasound is performed, showing either 1 dumbbell-shaped mass or 2 adjacent masses in the left breast. The first of these masses is seen at 6 o'clock, 7 cm from the nipple measuring 1.4 x  1.6 x 2.1 cm, containing calcifications. Chest wall invasion is not excluded on these images. The more anterior of the 2 masses measures 1.5 x 1.7 x 1.7 cm, containing calcifications. There is a single abnormal lymph node in the left axilla. On the 1 into the lymph node, the cortex measures 2.8 mm. On the other in, the cortex measures up to 5 mm. This lymph node stands out compared to the remainder of the axillary lymph nodes. IMPRESSION: Either 2 adjacent masses or 1 complex dumbbell-shaped mass containing calcifications. There is a small group of calcifications just anterior to the 2 masses spanning between 1 and 2 mm. There is a single abnormal lymph node in the left axilla. There are vascular calcifications in the anterior left breast. RECOMMENDATION: Recommend ultrasound-guided biopsy of the 2 adjacent masses in the left breast. Recommend stereotactic biopsy of the small group of calcifications just anterior to the masses. Recommend ultrasound-guided biopsy of the abnormal left axillary node. I have discussed the findings and recommendations with the patient. If applicable, a reminder letter will be sent to the patient regarding the next appointment. BI-RADS CATEGORY  5: Highly suggestive of malignancy. Electronically Signed   By: Dorise Bullion III M.D   On: 07/01/2019 14:34   ECHOCARDIOGRAM COMPLETE  Result Date: 07/25/2019   ECHOCARDIOGRAM REPORT   Patient Name:   RUBLE BUTTLER Date of Exam: 07/25/2019 Medical Rec #:  [id number]     Height:       60.0 in Accession #:  [id number]    Weight:       185.2 lb Date of Birth:  [birth year]     BSA:          1.81 m Patient Age:    36 years      BP:           165/79 mmHg Patient Gender: F             HR:           94 bpm. Exam Location:  Outpatient Procedure: 2D Echo, Cardiac Doppler and Color Doppler Indications:    Chemo evaluation  History:        Patient has no prior history of Echocardiogram examinations.                 Breast cancer.  Sonographer:    Dustin Flock Referring Phys: [place]  1. Left ventricular ejection fraction, by visual estimation, is 60 to 65%. The left ventricle has normal function. There is mildly increased left ventricular hypertrophy.  2. Left ventricular diastolic parameters are consistent with Grade I diastolic dysfunction (impaired relaxation).  3. The left ventricle has no regional wall motion abnormalities.  4. Global right ventricle has normal systolic function.The right ventricular size is normal. No increase in right ventricular wall thickness.  5. Left atrial size was normal.  6. Right atrial size was normal.  7. The mitral valve is normal in structure. No evidence of mitral valve regurgitation. No evidence of mitral stenosis.  8. The tricuspid valve is normal in structure.  9. The tricuspid valve is normal in structure. Tricuspid valve regurgitation is not demonstrated. 10. The aortic valve is normal in structure. Aortic valve regurgitation is trivial. No evidence of aortic valve sclerosis or stenosis. 11. The pulmonic valve was normal in structure. Pulmonic valve regurgitation is not visualized. 12. The inferior vena cava is normal in size with greater than 50% respiratory variability, suggesting right atrial pressure of 3 mmHg. 13. The average left ventricular global longitudinal strain is -14.8 %. FINDINGS  Left Ventricle: Left ventricular ejection fraction, by visual estimation, is 60 to 65%. The left ventricle has normal function. The average left ventricular global longitudinal strain is -14.8 %. The left ventricle has no regional wall motion abnormalities. There is mildly increased left ventricular hypertrophy. Left ventricular diastolic parameters are consistent with Grade I diastolic dysfunction (impaired relaxation). Normal left atrial pressure. Right Ventricle: The right ventricular size is normal. No increase in right ventricular wall thickness. Global RV systolic function is has normal systolic  function. Left Atrium: Left atrial size was normal in size. Right Atrium: Right atrial size was normal in size Pericardium: There is no evidence of pericardial effusion. Mitral Valve: The mitral valve is normal in structure. No evidence of mitral valve regurgitation. No evidence of mitral valve stenosis by observation. Tricuspid Valve: The tricuspid valve is normal in structure. Tricuspid valve regurgitation is not demonstrated. Aortic Valve: The aortic valve is normal in structure. Aortic valve regurgitation is trivial. Aortic regurgitation PHT measures 648 msec. The aortic valve is structurally normal, with no evidence of sclerosis or stenosis. Pulmonic Valve: The pulmonic valve was normal in structure. Pulmonic valve regurgitation is not visualized. Pulmonic regurgitation is not visualized. Aorta: The aortic root, ascending aorta and aortic arch are all structurally normal, with no evidence of dilitation or obstruction. Venous: The inferior vena cava is normal in size with greater than 50% respiratory variability, suggesting right atrial pressure of  3 mmHg. IAS/Shunts: No atrial level shunt detected by color flow Doppler. There is no evidence of a patent foramen ovale. No ventricular septal defect is seen or detected. There is no evidence of an atrial septal defect.  LEFT VENTRICLE PLAX 2D LVIDd:         4.00 cm  Diastology LVIDs:         2.50 cm  LV e' lateral:   6.96 cm/s LV PW:         1.30 cm  LV E/e' lateral: 11.4 LV IVS:        1.50 cm  LV e' medial:    7.51 cm/s LVOT diam:     2.10 cm  LV E/e' medial:  10.6 LV SV:         48 ml LV SV Index:   24.72    2D Longitudinal Strain LVOT Area:     3.46 cm 2D Strain GLS Avg:     -14.8 %  RIGHT VENTRICLE RV Basal diam:  2.90 cm RV S prime:     6.64 cm/s TAPSE (M-mode): 2.2 cm LEFT ATRIUM             Index       RIGHT ATRIUM           Index LA diam:        3.60 cm 1.99 cm/m  RA Area:     10.10 cm LA Vol (A2C):   37.1 ml 20.54 ml/m RA Volume:   19.40 ml  10.74  ml/m LA Vol (A4C):   38.0 ml 21.03 ml/m LA Biplane Vol: 40.5 ml 22.42 ml/m  AORTIC VALVE LVOT Vmax:   114.00 cm/s LVOT Vmean:  77.100 cm/s LVOT VTI:    0.247 m AI PHT:      648 msec  AORTA Ao Root diam: 3.00 cm MITRAL VALVE MV Area (PHT): 3.42 cm             SHUNTS MV PHT:        64.38 msec           Systemic VTI:  0.25 m MV Decel Time: 222 msec             Systemic Diam: 2.10 cm MV E velocity: 79.30 cm/s 103 cm/s MV A velocity: 93.00 cm/s 70.3 cm/s MV E/A ratio:  0.85       1.5  Candee Furbish MD Electronically signed by Candee Furbish MD Signature Date/Time: 07/25/2019/5:30:25 PM    Final    [place] AXILLARY NODE CORE BIOPSY LEFT  Addendum Date: 07/10/2019   ADDENDUM REPORT: 07/10/2019 14:14 ADDENDUM: Pathology revealed: 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 This was found to be concordant by Dr. Peggye Fothergill. 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 This was found to be concordant by Dr. Peggye Fothergill. METASTATIC CARCINOMA INVOLVING A LYMPH NODE of the Left axilla. This was found to be concordant by Dr. Peggye Fothergill. Pathology results were discussed with the patient in person by Dr. Hassan Rowan at the time of her stereotactic biopsy performed today. The patient reported doing well after the biopsies with tenderness at the sites. Post biopsy instructions and care were reviewed and questions were answered. The patient was encouraged to call The [facility] for any additional concerns. The patient was  referred to [facility] on July 17, 2019. [facility] for a Left breast stereotatic guided biopsy to evaluate extent of disease. Pathology results reported by Terie Purser, RN on 07/10/2019. Electronically Signed   By: Evangeline Dakin M.D.   On: 07/10/2019 14:14   Result Date: 07/10/2019 CLINICAL DATA:  74 year old with screening detected contiguous adjacent masses involving the LOWER LEFT breast at posterior depth associated with suspicious calcifications. Diagnostic workup also demonstrated a solitary abnormal LEFT axillary lymph node with focal cortical thickening up to 5 mm. She also has an indeterminate 2 mm group of calcifications in the LOWER LEFT breast anterior to the masses for which stereotactic tomosynthesis biopsy has been scheduled for tomorrow. EXAM: ULTRASOUND-GUIDED LEFT BREAST CORE NEEDLE BIOPSY x 2 ULTRASOUND-GUIDED CORE NEEDLE BIOPSY OF A LEFT AXILLARY LYMPH NODE COMPARISON:  Previous exam(s). FINDINGS: I met with the patient and we discussed the procedure of ultrasound-guided biopsy, including benefits and alternatives. We discussed the high likelihood of a successful procedure. We discussed the risks of the procedure, including infection, bleeding, tissue injury, clip migration, and inadequate sampling. Informed written consent was given. The usual time-out protocol was performed immediately prior to the procedure. # 1) Lesion quadrant: LOWER OUTER QUADRANT, 5:30 o'clock position 7 cm from nipple, ANTERIOR mass: Initially, using sterile technique with chlorhexidine as skin antisepsis, 1% lidocaine and 1% lidocaine with epinephrine as local anesthetic, under direct ultrasound visualization, a 12 gauge Bard Marquee core needle device placed through an 11 gauge introducer needle was used to perform biopsy of the ANTERIOR mass at the 5:30 o'clock position approximately 7 cm from nipple using a lateral approach. At the conclusion of the procedure a ribbon shaped tissue marker clip was deployed into the biopsy cavity. # 2) Lesion quadrant: LOWER OUTER QUADRANT, 5:30 o'clock position 7 cm from nipple, POSTERIOR mass: Using the same dermatotomy site as above, 1%  lidocaine and 1% lidocaine with epinephrine as local anesthetic, under direct ultrasound visualization, a second 12 gauge Bard Marquee core needle device placed through an 11 gauge introducer needle was used perform biopsy of the POSTERIOR mass at the 5:30 o'clock position approximately 7 cm from the nipple using a LATERAL approach. At the conclusion of the procedure a coil shaped tissue marker clip was deployed into the biopsy cavity. # 3) LEFT axillary node: Using sterile technique with chlorhexidine as skin antisepsis, 1% lidocaine and 1% lidocaine with epinephrine as local anesthetic, under direct ultrasound visualization, a 14 gauge Bard Marquee core needle device placed through a 13 gauge introducer needle was used perform biopsy of the abnormal LEFT axillary lymph node with focal cortical thickening using an inferolateral approach. At the conclusion of the procedure a Q shaped tissue marker clip was deployed into the biopsy cavity. Follow up 2 view mammogram was performed and dictated separately. IMPRESSION: Ultrasound-guided biopsy of 2 adjacent contiguous masses involving the LOWER OUTER QUADRANT of the LEFT breast and ultrasound-guided biopsy of a solitary abnormal LEFT axillary lymph node. No apparent complications. Electronically Signed: By: Evangeline Dakin M.D. On: 07/09/2019 14:14   MM CLIP PLACEMENT LEFT  Addendum Date: 07/11/2019   ADDENDUM REPORT: 07/11/2019 14:10 ADDENDUM: Pathology revealed FIBROCYSTIC CHANGES WITH CALCIFICATIONS of the LEFT breast, central. This was found to be concordant by Dr. Hassan Rowan. Pathology results were discussed with the patient by telephone. The patient reported doing well after the biopsy with tenderness  at the site. Post biopsy instructions and care were reviewed and questions were answered. The patient was encouraged to call The [facility] for any additional concerns. The patient has a recent diagnosis of LEFT breast cancer and was  referred to [facility] at [facility] on July 16, 2018. Pathology results reported by Terie Purser, RN on 07/11/2019. Electronically Signed   By: Margarette Canada M.D.   On: 07/11/2019 14:10   Result Date: 07/11/2019 CLINICAL DATA:  74 year old female for tissue sampling of 0.2 cm group of calcifications within the central LEFT breast. EXAM: LEFT BREAST STEREOTACTIC CORE NEEDLE BIOPSY 3D DIAGNOSTIC LEFT MAMMOGRAM POST STEREOTACTIC BIOPSY COMPARISON:  Previous exams. FINDINGS: The patient and I discussed the procedure of stereotactic-guided biopsy including benefits and alternatives. We discussed the high likelihood of a successful procedure. We discussed the risks of the procedure including infection, bleeding, tissue injury, clip migration, and inadequate sampling. Informed written consent was given. The usual time out protocol was performed immediately prior to the procedure. Using sterile technique and 1% Lidocaine as local anesthetic, under stereotactic guidance, a 9 gauge vacuum assisted device was used to perform core needle biopsy of the 0.2 cm group of calcifications within the central LEFT breast using a LATERAL approach. Specimen radiograph was performed showing calcifications. Specimens with calcifications are identified for pathology. At the conclusion of the procedure, 2 separate X shaped tissue marker clips were deployed into the biopsy cavity. The 2nd X shaped clip was placed as the 1st clip was not visualized on the immediate post clip image during the procedure. Follow-up 2-view mammogram was performed demonstrating 2 X shaped tissue marker clips in satisfactory position at the expected site of biopsy within the LEFT breast. IMPRESSION: Stereotactic-guided biopsy of 0.2 cm group of calcifications within the central LEFT breast. No apparent complications. Satisfactory position of 2 X shaped tissue marker clips within the LEFT breast following  stereotactic guided biopsy. Electronically Signed: By: Margarette Canada M.D. On: 07/10/2019 12:10   MM CLIP PLACEMENT LEFT  Result Date: 07/09/2019 CLINICAL DATA:  Confirmation clip placement after ultrasound-guided core needle biopsy of 2 adjacent contiguous masses involving the LOWER OUTER QUADRANT of the LEFT breast at POSTERIOR depth and ultrasound-guided core needle biopsy of a solitary abnormal LEFT axillary lymph node. EXAM: 2D AND TOMOSYNTHESIS DIAGNOSTIC LEFT MAMMOGRAM POST ULTRASOUND BIOPSY COMPARISON:  Previous exam(s). FINDINGS: Mammographic images were obtained following ultrasound guided biopsy of 2 adjacent contiguous masses involving the LOWER OUTER LEFT breast and ultrasound-guided biopsy of a solitary abnormal LEFT axillary lymph node. The ribbon shaped tissue marking clip is appropriately positioned within the biopsied ANTERIOR mass in the [place] at POSTERIOR depth. The coil shaped tissue marker clip is appropriately positioned within the biopsied POSTERIOR mass in the [place] at POSTERIOR depth. The ribbon clip and the coil clip are separated by approximately 1.9 cm. The Q shaped tissue marker clip is appropriately positioned within the biopsied abnormal LEFT axillary lymph node. Expected post biopsy changes are present at each site without evidence of hematoma. IMPRESSION: 1. Appropriate positioning of the ribbon shaped tissue marking clip within the biopsied ANTERIOR mass in the [place] at POSTERIOR depth. 2. Appropriate positioning of the coil shaped tissue marker clip within the biopsied POSTERIOR mass in the LOWER OUTER QUADRANT at POSTERIOR depth. 3. Appropriate positioning of the Q shaped tissue marker clip within the biopsied abnormal LEFT axillary lymph node. Final Assessment:  Post Procedure Mammograms for Marker Placement Electronically Signed   By: Evangeline Dakin M.D.   On: 07/09/2019 14:18   MM LT BREAST BX W LOC DEV 1ST LESION IMAGE BX SPEC  STEREO GUIDE  Addendum Date: 07/11/2019   ADDENDUM REPORT: 07/11/2019 14:10 ADDENDUM: Pathology revealed FIBROCYSTIC CHANGES WITH CALCIFICATIONS of the LEFT breast, central. This was found to be concordant by Dr. Hassan Rowan. Pathology results were discussed with the patient by telephone. The patient reported doing well after the biopsy with tenderness at the site. Post biopsy instructions and care were reviewed and questions were answered. The patient was encouraged to call The [facility] for any additional concerns. The patient has a recent diagnosis of LEFT breast cancer and was referred to [facility] on July 16, 2018. Pathology results reported by Terie Purser, RN on 07/11/2019. Electronically Signed   By: Margarette Canada M.D.   On: 07/11/2019 14:10   Result Date: 07/11/2019 CLINICAL DATA:  74 year old female for tissue sampling of 0.2 cm group of calcifications within the central LEFT breast. EXAM: LEFT BREAST STEREOTACTIC CORE NEEDLE BIOPSY 3D DIAGNOSTIC LEFT MAMMOGRAM POST STEREOTACTIC BIOPSY COMPARISON:  Previous exams. FINDINGS: The patient and I discussed the procedure of stereotactic-guided biopsy including benefits and alternatives. We discussed the high likelihood of a successful procedure. We discussed the risks of the procedure including infection, bleeding, tissue injury, clip migration, and inadequate sampling. Informed written consent was given. The usual time out protocol was performed immediately prior to the procedure. Using sterile technique and 1% Lidocaine as local anesthetic, under stereotactic guidance, a 9 gauge vacuum assisted device was used to perform core needle biopsy of the 0.2 cm group of calcifications within the central LEFT breast using a LATERAL approach. Specimen radiograph was performed showing calcifications. Specimens with calcifications are identified for pathology. At the  conclusion of the procedure, 2 separate X shaped tissue marker clips were deployed into the biopsy cavity. The 2nd X shaped clip was placed as the 1st clip was not visualized on the immediate post clip image during the procedure. Follow-up 2-view mammogram was performed demonstrating 2 X shaped tissue marker clips in satisfactory position at the expected site of biopsy within the LEFT breast. IMPRESSION: Stereotactic-guided biopsy of 0.2 cm group of calcifications within the central LEFT breast. No apparent complications. Satisfactory position of 2 X shaped tissue marker clips within the LEFT breast following stereotactic guided biopsy. Electronically Signed: By: Margarette Canada M.D. 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 This was found to be concordant by Dr. Peggye Fothergill. 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 This was found to be concordant by Dr. Peggye Fothergill. METASTATIC CARCINOMA INVOLVING A LYMPH NODE of the Left axilla. This was found to be concordant by Dr. Peggye Fothergill. Pathology results were discussed with the patient in person by Dr. Hassan Rowan at the time of her stereotactic biopsy performed today. The patient reported doing well after the biopsies with tenderness at the sites. Post biopsy instructions and care were reviewed and questions were answered. The patient was encouraged  to call The [facility] for any additional concerns. The patient was referred to [facility] on July 17, 2019. [facility] 2019 for a Left breast stereotatic guided biopsy to evaluate extent of disease. Pathology results reported by Terie Purser, RN on 07/10/2019. Electronically Signed   By: Evangeline Dakin M.D.   On: 07/10/2019 14:14   Result Date: 07/10/2019 CLINICAL DATA:  74 year old with screening detected contiguous adjacent masses involving the LOWER LEFT breast at posterior depth associated with suspicious calcifications. Diagnostic workup also demonstrated a solitary abnormal LEFT axillary lymph node with focal cortical thickening up to 5 mm. She also has an indeterminate 2 mm group of calcifications in the LOWER LEFT breast anterior to the masses for which stereotactic tomosynthesis biopsy has been scheduled for tomorrow. EXAM: ULTRASOUND-GUIDED LEFT BREAST CORE NEEDLE BIOPSY x 2 ULTRASOUND-GUIDED CORE NEEDLE BIOPSY OF A LEFT AXILLARY LYMPH NODE COMPARISON:  Previous exam(s). FINDINGS: I met with the patient and we discussed the procedure of ultrasound-guided biopsy, including benefits and alternatives. We discussed the high likelihood of a successful procedure. We discussed the risks of the procedure, including infection, bleeding, tissue injury, clip migration, and inadequate sampling. Informed written consent was given. The usual time-out protocol was performed immediately prior to the procedure. # 1) Lesion quadrant: LOWER OUTER QUADRANT, 5:30 o'clock position 7 cm from nipple, ANTERIOR mass: Initially, using sterile technique with chlorhexidine as skin antisepsis, 1% lidocaine and 1% lidocaine with epinephrine as local anesthetic, under direct ultrasound visualization, a 12 gauge Bard Marquee core needle device placed through an 11 gauge introducer needle was used to perform biopsy of the ANTERIOR mass at the 5:30 o'clock position approximately 7 cm from nipple using a lateral approach. At the conclusion of the procedure a ribbon shaped tissue marker clip was deployed into the biopsy cavity. # 2)  Lesion quadrant: LOWER OUTER QUADRANT, 5:30 o'clock position 7 cm from nipple, POSTERIOR mass: Using the same dermatotomy site as above, 1% lidocaine and 1% lidocaine with epinephrine as local anesthetic, under direct ultrasound visualization, a second 12 gauge Bard Marquee core needle device placed through an 11 gauge introducer needle was used perform biopsy of the POSTERIOR mass at the 5:30 o'clock position approximately 7 cm from the nipple using a LATERAL approach. At the conclusion of the procedure a coil shaped tissue marker clip was deployed into the biopsy cavity. # 3) LEFT axillary node: Using sterile technique with chlorhexidine as skin antisepsis, 1% lidocaine and 1% lidocaine with epinephrine as local anesthetic, under direct ultrasound visualization, a 14 gauge Bard Marquee core needle device placed through a 13 gauge introducer needle was used perform biopsy of the abnormal LEFT axillary lymph node with focal cortical thickening using an inferolateral approach. At the conclusion of the procedure a Q shaped tissue marker clip was deployed into the biopsy cavity. Follow up 2 view mammogram was performed and dictated separately. IMPRESSION: Ultrasound-guided biopsy of 2 adjacent contiguous masses involving the LOWER OUTER QUADRANT of the LEFT breast and ultrasound-guided biopsy of a solitary abnormal LEFT axillary lymph node. No apparent complications. Electronically Signed: By: Evangeline Dakin M.D. 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 This was found to be concordant by Dr. Peggye Fothergill. 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 This was found to be concordant by Dr. Peggye Fothergill. METASTATIC CARCINOMA INVOLVING A LYMPH NODE of the Left axilla. This was found to be concordant by Dr. Peggye Fothergill. Pathology results were discussed with the patient in person by Dr. Hassan Rowan at the time of her stereotactic biopsy performed today. The patient reported doing well after the biopsies with tenderness at the sites. Post biopsy instructions and care were reviewed and questions were answered. The patient was encouraged to call The [facility] for any additional concerns. The patient was referred to [facility] at [facility] on July 17, 2019. [facility], 2019 for a Left breast stereotatic guided biopsy to evaluate extent of disease. Pathology results reported by Terie Purser, RN on 07/10/2019. Electronically Signed   By: Evangeline Dakin M.D.   On: 07/10/2019 14:14   Result Date: 07/10/2019 CLINICAL DATA:  74 year old with screening detected contiguous adjacent masses involving the LOWER LEFT breast at posterior depth associated with suspicious calcifications. Diagnostic workup also demonstrated a solitary abnormal LEFT axillary lymph node with focal cortical thickening up to 5 mm. She also has an indeterminate 2 mm group of calcifications in the LOWER LEFT breast anterior to the masses for which stereotactic tomosynthesis biopsy has been scheduled for tomorrow. EXAM: ULTRASOUND-GUIDED LEFT BREAST CORE NEEDLE BIOPSY x 2 ULTRASOUND-GUIDED CORE NEEDLE BIOPSY OF A LEFT AXILLARY LYMPH NODE COMPARISON:  Previous exam(s). FINDINGS: I met with the patient and we discussed the procedure of ultrasound-guided biopsy, including benefits and alternatives. We discussed the high likelihood of a successful procedure. We discussed the risks of the procedure, including infection, bleeding, tissue injury, clip migration,  and inadequate sampling. Informed written consent was given. The usual time-out protocol was performed immediately prior to the procedure. # 1) Lesion quadrant: LOWER OUTER QUADRANT, 5:30 o'clock position 7 cm from nipple, ANTERIOR mass: Initially, using sterile technique with chlorhexidine as skin antisepsis, 1% lidocaine and 1% lidocaine with epinephrine as local anesthetic, under direct ultrasound visualization, a 12 gauge Bard Marquee core needle device placed through an 11 gauge introducer needle was used to perform biopsy of the ANTERIOR mass at the 5:30 o'clock position approximately 7 cm from nipple using a lateral approach. At the conclusion of the procedure a ribbon shaped tissue marker clip was deployed into the biopsy cavity. # 2) Lesion quadrant: LOWER OUTER QUADRANT, 5:30 o'clock position 7 cm from nipple, POSTERIOR mass: Using the same dermatotomy site as above, 1% lidocaine and 1% lidocaine with epinephrine as local anesthetic, under direct ultrasound visualization, a second 12 gauge Bard Marquee core needle device placed through an 11 gauge introducer needle was used perform biopsy of the POSTERIOR mass at the 5:30 o'clock position approximately 7 cm from the nipple using a LATERAL approach. At the conclusion of the procedure a coil shaped tissue marker clip was deployed into the biopsy cavity. # 3) LEFT axillary node: Using sterile technique with chlorhexidine as skin antisepsis, 1% lidocaine and 1% lidocaine with epinephrine as local anesthetic, under direct ultrasound visualization, a 14 gauge Bard Marquee core needle device placed through a 13 gauge introducer needle was used perform biopsy of the abnormal LEFT  axillary lymph node with focal cortical thickening using an inferolateral approach. At the conclusion of the procedure a Q shaped tissue marker clip was deployed into the biopsy cavity. Follow up 2 view mammogram was performed and dictated separately. IMPRESSION: Ultrasound-guided biopsy  of 2 adjacent contiguous masses involving the LOWER OUTER QUADRANT of the LEFT breast and ultrasound-guided biopsy of a solitary abnormal LEFT axillary lymph node. No apparent complications. Electronically Signed: By: Evangeline Dakin M.D. On: 07/09/2019 14:14     ELIGIBLE FOR AVAILABLE RESEARCH PROTOCOL: no  ASSESSMENT: 74 y.o. [place] woman status post left breast lower outer quadrant biopsy 07/09/2019 for a clinical T2 N1, stage IIIB invasive ductal carcinoma, grade 3, triple negative, with an MIB-1 of 60%  (a) chest CT scan and bone scan 07/31/2019  (b) MRI biopsy of 2 additional areas in the left breast 07/26/2019   (1) neoadjuvant chemotherapy to consist of cyclophosphamide and doxorubicin in dose dense fashion x4 to start 07/30/2019 followed by weekly carboplatin and paclitaxel x12  (2) definitive surgery to follow with targeted axillary lymph node dissection  (3) adjuvant radiation to follow  (4) genetics testing 07/23/2019 through the STAT Breast cancer panel offered by [facility] found no deleterious mutations in ATM, BRCA1, BRCA2, CDH1, CHEK2, PALB2, PTEN, STK11 and TP53.  Additional testing through the Common Hereditary Cancers Panel offered by [facility] also found no deleterious mutations in IPC, ATM, AXIN2, BARD1, BMPR1A, BRCA1, BRCA2, BRIP1, CDH1, CDK4, CDKN2A (p14ARF), CDKN2A (p16INK4a), CHEK2, CTNNA1, DICER1, EPCAM (Deletion/duplication testing only), GREM1 (promoter region deletion/duplication testing only), KIT, MEN1, MLH1, MSH2, MSH3, MSH6, MUTYH, NBN, NF1, NHTL1, PALB2, PDGFRA, PMS2, POLD1, POLE, PTEN, RAD50, RAD51C, RAD51D, RNF43, SDHB, SDHC, SDHD, SMAD4, SMARCA4. STK11, TP53, TSC1, TSC2, and VHL.  The following genes were evaluated for sequence changes only: SDHA and HOXB13 c.251G>A variant only.   (a) 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   PLAN: Aquila is marching right through all the steps needed so she can safely start  chemotherapy next week.  She will need additional biopsies of 2 small areas in the left breast.  I showed her the MRI and she is willing to proceed.  That has been scheduled for later today.  She will have her port placed on 07/29/2019 and that will be left accessed so she can proceed to chemo the next day, 07/30/2019.  Today we reviewed the possible toxicities side effects and complications of the agents involved and also I gave her a routing sheet and we went over in detail how to take her supportive medications.  I answered all the prescription so she can obtain the drugs today and become familiar with them before that date.  The only unusual aspect is that her staging studies will not be obtained until later next week, after her first cycle of chemotherapy.  She will return to see me on day 8 from the first cycle and I have asked her to keep a diary of symptoms so we can troubleshoot how to solve them before the next cycle comes around  I strongly encouraged her to start a walking program.  Total encounter time today 60 minutes.Chauncey Cruel, MD   07/26/2019 1:45 PM Medical Oncology and Hematology [facility] [place], [place] [address] Tel. [phone]    Fax. [phone]   This document serves as a record of services personally performed by Lurline Del, MD. It was created on his behalf by  Wilburn Mylar, a trained medical scribe. The creation of this record is based on the scribe's personal observations and the provider's statements to them.   I, Lurline Del MD, have reviewed the above documentation for accuracy and completeness, and I agree with the above.  *Total Encounter Time as defined by the Centers for Medicare and Medicaid Services includes, in addition to the face-to-face time of a patient visit (documented in the note above) non-face-to-face time: obtaining and reviewing outside history, ordering and reviewing medications, tests  or procedures, care coordination (communications with other health care professionals or caregivers) and documentation in the medical record.

## 2019-07-25 NOTE — Telephone Encounter (Signed)
Left vm for pt regarding [facility] on 07/17/19. Contact information provided for questions or needs.

## 2019-07-25 NOTE — Progress Notes (Signed)
  Echocardiogram 2D Echocardiogram has been performed.  Matilde Bash 07/25/2019, 10:00 AM

## 2019-07-25 NOTE — Progress Notes (Signed)

## 2019-07-26 ENCOUNTER — Other Ambulatory Visit: Payer: Self-pay

## 2019-07-26 ENCOUNTER — Other Ambulatory Visit: Payer: Self-pay | Admitting: Surgery

## 2019-07-26 ENCOUNTER — Ambulatory Visit
Admission: RE | Admit: 2019-07-26 | Discharge: 2019-07-26 | Disposition: A | Discharge status: Home / Self Care | Payer: Medicare Other | Source: Ambulatory Visit | Attending: Surgery | Admitting: Surgery

## 2019-07-26 ENCOUNTER — Telehealth: Payer: Self-pay | Admitting: Oncology

## 2019-07-26 ENCOUNTER — Inpatient Hospital Stay: Payer: Medicare Other | Admitting: Oncology

## 2019-07-26 ENCOUNTER — Other Ambulatory Visit (HOSPITAL_COMMUNITY): Payer: Self-pay | Admitting: Diagnostic Radiology

## 2019-07-26 VITALS — BP 161/81 | HR 74 | Temp 97.8°F | Resp 17 | Ht 60.0 in | Wt 187.3 lb

## 2019-07-26 DIAGNOSIS — N6313 Unspecified lump in the right breast, lower outer quadrant: Secondary | ICD-10-CM | POA: Diagnosis not present

## 2019-07-26 DIAGNOSIS — R9389 Abnormal findings on diagnostic imaging of other specified body structures: Secondary | ICD-10-CM

## 2019-07-26 DIAGNOSIS — Z791 Long term (current) use of non-steroidal anti-inflammatories (NSAID): Secondary | ICD-10-CM | POA: Diagnosis not present

## 2019-07-26 DIAGNOSIS — Z5189 Encounter for other specified aftercare: Secondary | ICD-10-CM | POA: Diagnosis not present

## 2019-07-26 DIAGNOSIS — N6324 Unspecified lump in the left breast, lower inner quadrant: Secondary | ICD-10-CM | POA: Diagnosis not present

## 2019-07-26 DIAGNOSIS — N6315 Unspecified lump in the right breast, overlapping quadrants: Secondary | ICD-10-CM | POA: Diagnosis not present

## 2019-07-26 DIAGNOSIS — Z800 Family history of malignant neoplasm of digestive organs: Secondary | ICD-10-CM | POA: Diagnosis not present

## 2019-07-26 DIAGNOSIS — J45909 Unspecified asthma, uncomplicated: Secondary | ICD-10-CM | POA: Diagnosis not present

## 2019-07-26 DIAGNOSIS — C50512 Malignant neoplasm of lower-outer quadrant of left female breast: Secondary | ICD-10-CM

## 2019-07-26 DIAGNOSIS — Z803 Family history of malignant neoplasm of breast: Secondary | ICD-10-CM | POA: Diagnosis not present

## 2019-07-26 DIAGNOSIS — C773 Secondary and unspecified malignant neoplasm of axilla and upper limb lymph nodes: Secondary | ICD-10-CM | POA: Diagnosis not present

## 2019-07-26 DIAGNOSIS — Z5111 Encounter for antineoplastic chemotherapy: Secondary | ICD-10-CM | POA: Diagnosis not present

## 2019-07-26 DIAGNOSIS — N6011 Diffuse cystic mastopathy of right breast: Secondary | ICD-10-CM | POA: Diagnosis not present

## 2019-07-26 DIAGNOSIS — N6092 Unspecified benign mammary dysplasia of left breast: Secondary | ICD-10-CM | POA: Diagnosis not present

## 2019-07-26 DIAGNOSIS — Z9079 Acquired absence of other genital organ(s): Secondary | ICD-10-CM | POA: Diagnosis not present

## 2019-07-26 DIAGNOSIS — Z79899 Other long term (current) drug therapy: Secondary | ICD-10-CM | POA: Diagnosis not present

## 2019-07-26 DIAGNOSIS — Z7982 Long term (current) use of aspirin: Secondary | ICD-10-CM | POA: Diagnosis not present

## 2019-07-26 DIAGNOSIS — Z90722 Acquired absence of ovaries, bilateral: Secondary | ICD-10-CM | POA: Diagnosis not present

## 2019-07-26 DIAGNOSIS — Z171 Estrogen receptor negative status [ER-]: Secondary | ICD-10-CM

## 2019-07-26 DIAGNOSIS — Z90710 Acquired absence of both cervix and uterus: Secondary | ICD-10-CM | POA: Diagnosis not present

## 2019-07-26 DIAGNOSIS — Z8041 Family history of malignant neoplasm of ovary: Secondary | ICD-10-CM | POA: Diagnosis not present

## 2019-07-26 DIAGNOSIS — Z7952 Long term (current) use of systemic steroids: Secondary | ICD-10-CM | POA: Diagnosis not present

## 2019-07-26 HISTORY — PX: BREAST BIOPSY: SHX20

## 2019-07-26 MED ORDER — LORATADINE 10 MG PO TABS: 10.0000 mg | ORAL_TABLET | Freq: Every day | ORAL | 1 refills | Status: AC

## 2019-07-26 MED ORDER — PROCHLORPERAZINE MALEATE 10 MG PO TABS: 10.0000 mg | ORAL_TABLET | Freq: Four times a day (QID) | ORAL | 1 refills | Status: DC | PRN

## 2019-07-26 MED ORDER — LIDOCAINE-PRILOCAINE 2.5-2.5 % EX CREA: TOPICAL_CREAM | CUTANEOUS | 3 refills | Status: DC

## 2019-07-26 MED ORDER — GADOBUTROL 1 MMOL/ML IV SOLN
8.0000 mL | Freq: Once | INTRAVENOUS | Status: AC | PRN
  Administered 2019-07-26: 8 mL via INTRAVENOUS

## 2019-07-26 MED ORDER — LORAZEPAM 0.5 MG PO TABS: 0.5000 mg | ORAL_TABLET | Freq: Every evening | ORAL | 0 refills | Status: DC | PRN

## 2019-07-26 MED ORDER — DEXAMETHASONE 4 MG PO TABS: ORAL_TABLET | ORAL | 1 refills | Status: DC

## 2019-07-26 NOTE — Telephone Encounter (Signed)
I talk with patient regarding schedule  

## 2019-07-29 ENCOUNTER — Ambulatory Visit (HOSPITAL_BASED_OUTPATIENT_CLINIC_OR_DEPARTMENT_OTHER): Payer: Medicare Other | Admitting: Certified Registered"

## 2019-07-29 ENCOUNTER — Encounter (HOSPITAL_BASED_OUTPATIENT_CLINIC_OR_DEPARTMENT_OTHER)
Admission: RE | Disposition: A | Discharge status: Home / Self Care | Payer: Self-pay | Source: Home / Self Care | Attending: Surgery

## 2019-07-29 ENCOUNTER — Ambulatory Visit
Admission: RE | Admit: 2019-07-29 | Discharge: 2019-07-29 | Disposition: A | Discharge status: Home / Self Care | Payer: Medicare Other | Source: Ambulatory Visit | Attending: Surgery | Admitting: Surgery

## 2019-07-29 ENCOUNTER — Other Ambulatory Visit: Payer: Self-pay

## 2019-07-29 ENCOUNTER — Ambulatory Visit (HOSPITAL_BASED_OUTPATIENT_CLINIC_OR_DEPARTMENT_OTHER)
Admission: RE | Admit: 2019-07-29 | Discharge: 2019-07-29 | Disposition: A | Discharge status: Home / Self Care | Payer: Medicare Other | Attending: Surgery | Admitting: Surgery

## 2019-07-29 ENCOUNTER — Ambulatory Visit (HOSPITAL_COMMUNITY): Payer: Medicare Other

## 2019-07-29 ENCOUNTER — Encounter (HOSPITAL_BASED_OUTPATIENT_CLINIC_OR_DEPARTMENT_OTHER): Payer: Self-pay | Admitting: Surgery

## 2019-07-29 ENCOUNTER — Other Ambulatory Visit: Payer: Self-pay | Admitting: Oncology

## 2019-07-29 DIAGNOSIS — R928 Other abnormal and inconclusive findings on diagnostic imaging of breast: Secondary | ICD-10-CM | POA: Diagnosis not present

## 2019-07-29 DIAGNOSIS — Z171 Estrogen receptor negative status [ER-]: Secondary | ICD-10-CM | POA: Insufficient documentation

## 2019-07-29 DIAGNOSIS — R9389 Abnormal findings on diagnostic imaging of other specified body structures: Secondary | ICD-10-CM

## 2019-07-29 DIAGNOSIS — C50919 Malignant neoplasm of unspecified site of unspecified female breast: Secondary | ICD-10-CM | POA: Diagnosis not present

## 2019-07-29 DIAGNOSIS — Z9889 Other specified postprocedural states: Secondary | ICD-10-CM

## 2019-07-29 DIAGNOSIS — N6313 Unspecified lump in the right breast, lower outer quadrant: Secondary | ICD-10-CM | POA: Diagnosis not present

## 2019-07-29 DIAGNOSIS — C773 Secondary and unspecified malignant neoplasm of axilla and upper limb lymph nodes: Secondary | ICD-10-CM | POA: Insufficient documentation

## 2019-07-29 DIAGNOSIS — C50912 Malignant neoplasm of unspecified site of left female breast: Secondary | ICD-10-CM | POA: Insufficient documentation

## 2019-07-29 DIAGNOSIS — Z882 Allergy status to sulfonamides status: Secondary | ICD-10-CM | POA: Insufficient documentation

## 2019-07-29 DIAGNOSIS — Z452 Encounter for adjustment and management of vascular access device: Secondary | ICD-10-CM | POA: Diagnosis not present

## 2019-07-29 DIAGNOSIS — Z880 Allergy status to penicillin: Secondary | ICD-10-CM | POA: Insufficient documentation

## 2019-07-29 DIAGNOSIS — N6324 Unspecified lump in the left breast, lower inner quadrant: Secondary | ICD-10-CM | POA: Diagnosis not present

## 2019-07-29 DIAGNOSIS — Z419 Encounter for procedure for purposes other than remedying health state, unspecified: Secondary | ICD-10-CM

## 2019-07-29 DIAGNOSIS — C50512 Malignant neoplasm of lower-outer quadrant of left female breast: Secondary | ICD-10-CM | POA: Diagnosis not present

## 2019-07-29 DIAGNOSIS — E039 Hypothyroidism, unspecified: Secondary | ICD-10-CM | POA: Diagnosis not present

## 2019-07-29 DIAGNOSIS — N6315 Unspecified lump in the right breast, overlapping quadrants: Secondary | ICD-10-CM | POA: Diagnosis not present

## 2019-07-29 HISTORY — PX: PORTACATH PLACEMENT: SHX2246

## 2019-07-29 HISTORY — DX: Hypothyroidism, unspecified: E03.9

## 2019-07-29 HISTORY — DX: Hyperlipidemia, unspecified: E78.5

## 2019-07-29 SURGERY — INSERTION, TUNNELED CENTRAL VENOUS DEVICE, WITH PORT
Anesthesia: General | Site: Chest | Laterality: Right

## 2019-07-29 MED ORDER — LACTATED RINGERS IV SOLN: INTRAVENOUS | Status: DC

## 2019-07-29 MED ORDER — HEPARIN (PORCINE) IN NACL 1000-0.9 UT/500ML-% IV SOLN
INTRAVENOUS | Status: AC
  Filled 2019-07-29: qty 500

## 2019-07-29 MED ORDER — DEXAMETHASONE SODIUM PHOSPHATE 10 MG/ML IJ SOLN
INTRAMUSCULAR | Status: AC
  Filled 2019-07-29: qty 1

## 2019-07-29 MED ORDER — MIDAZOLAM HCL 2 MG/2ML IJ SOLN: 1.0000 mg | INTRAMUSCULAR | Status: DC | PRN

## 2019-07-29 MED ORDER — BUPIVACAINE HCL (PF) 0.25 % IJ SOLN
INTRAMUSCULAR | Status: DC | PRN
  Administered 2019-07-29: 10 mL

## 2019-07-29 MED ORDER — ONDANSETRON HCL 4 MG/2ML IJ SOLN: 4.0000 mg | Freq: Once | INTRAMUSCULAR | Status: DC | PRN

## 2019-07-29 MED ORDER — ONDANSETRON HCL 4 MG/2ML IJ SOLN
INTRAMUSCULAR | Status: AC
  Filled 2019-07-29: qty 2

## 2019-07-29 MED ORDER — PROPOFOL 10 MG/ML IV BOLUS
INTRAVENOUS | Status: AC
  Filled 2019-07-29: qty 40

## 2019-07-29 MED ORDER — HEPARIN (PORCINE) IN NACL 2-0.9 UNITS/ML
INTRAMUSCULAR | Status: AC | PRN
  Administered 2019-07-29: 1 via INTRAVENOUS

## 2019-07-29 MED ORDER — FENTANYL CITRATE (PF) 100 MCG/2ML IJ SOLN
INTRAMUSCULAR | Status: AC
  Filled 2019-07-29: qty 2

## 2019-07-29 MED ORDER — LACTATED RINGERS IV SOLN: INTRAVENOUS | Status: DC | PRN

## 2019-07-29 MED ORDER — PHENYLEPHRINE HCL (PRESSORS) 10 MG/ML IV SOLN
INTRAVENOUS | Status: DC | PRN
  Administered 2019-07-29 (×3): 120 ug via INTRAVENOUS

## 2019-07-29 MED ORDER — FENTANYL CITRATE (PF) 100 MCG/2ML IJ SOLN: 50.0000 ug | INTRAMUSCULAR | Status: DC | PRN

## 2019-07-29 MED ORDER — CEFAZOLIN SODIUM-DEXTROSE 2-4 GM/100ML-% IV SOLN
INTRAVENOUS | Status: AC
  Filled 2019-07-29: qty 100

## 2019-07-29 MED ORDER — FENTANYL CITRATE (PF) 100 MCG/2ML IJ SOLN: 25.0000 ug | INTRAMUSCULAR | Status: DC | PRN

## 2019-07-29 MED ORDER — ACETAMINOPHEN 500 MG PO TABS
1000.0000 mg | ORAL_TABLET | ORAL | Status: AC
  Administered 2019-07-29: 12:00:00 1000 mg via ORAL

## 2019-07-29 MED ORDER — SILVER NITRATE-POT NITRATE 75-25 % EX MISC
CUTANEOUS | Status: AC
  Filled 2019-07-29: qty 10

## 2019-07-29 MED ORDER — LIDOCAINE HCL (CARDIAC) PF 100 MG/5ML IV SOSY
PREFILLED_SYRINGE | INTRAVENOUS | Status: DC | PRN
  Administered 2019-07-29: 100 mg via INTRAVENOUS

## 2019-07-29 MED ORDER — CEFAZOLIN SODIUM-DEXTROSE 2-4 GM/100ML-% IV SOLN: 2.0000 g | INTRAVENOUS | Status: DC

## 2019-07-29 MED ORDER — BUPIVACAINE HCL (PF) 0.25 % IJ SOLN
INTRAMUSCULAR | Status: AC
  Filled 2019-07-29: qty 30

## 2019-07-29 MED ORDER — DEXAMETHASONE SODIUM PHOSPHATE 10 MG/ML IJ SOLN
INTRAMUSCULAR | Status: DC | PRN
  Administered 2019-07-29: 10 mg via INTRAVENOUS

## 2019-07-29 MED ORDER — ACETAMINOPHEN 500 MG PO TABS
ORAL_TABLET | ORAL | Status: AC
  Filled 2019-07-29: qty 2

## 2019-07-29 MED ORDER — LIDOCAINE 2% (20 MG/ML) 5 ML SYRINGE
INTRAMUSCULAR | Status: AC
  Filled 2019-07-29: qty 5

## 2019-07-29 MED ORDER — HYDROCODONE-ACETAMINOPHEN 5-325 MG PO TABS: 1.0000 | ORAL_TABLET | Freq: Four times a day (QID) | ORAL | 0 refills | Status: DC | PRN

## 2019-07-29 MED ORDER — CEFAZOLIN SODIUM-DEXTROSE 2-3 GM-%(50ML) IV SOLR
INTRAVENOUS | Status: DC | PRN
  Administered 2019-07-29: 2 g via INTRAVENOUS

## 2019-07-29 MED ORDER — ONDANSETRON HCL 4 MG/2ML IJ SOLN
INTRAMUSCULAR | Status: DC | PRN
  Administered 2019-07-29: 4 mg via INTRAVENOUS

## 2019-07-29 MED ORDER — FENTANYL CITRATE (PF) 100 MCG/2ML IJ SOLN
INTRAMUSCULAR | Status: DC | PRN
  Administered 2019-07-29: 100 ug via INTRAVENOUS

## 2019-07-29 MED ORDER — HEPARIN SOD (PORK) LOCK FLUSH 100 UNIT/ML IV SOLN
INTRAVENOUS | Status: AC
  Filled 2019-07-29: qty 5

## 2019-07-29 MED ORDER — HEPARIN SOD (PORK) LOCK FLUSH 100 UNIT/ML IV SOLN
INTRAVENOUS | Status: DC | PRN
  Administered 2019-07-29: 500 [IU] via INTRAVENOUS

## 2019-07-29 MED ORDER — BUPIVACAINE LIPOSOME 1.3 % IJ SUSP: 20.0000 mL | Freq: Once | INTRAMUSCULAR | Status: DC

## 2019-07-29 MED ORDER — PROPOFOL 10 MG/ML IV BOLUS
INTRAVENOUS | Status: DC | PRN
  Administered 2019-07-29: 200 mg via INTRAVENOUS

## 2019-07-29 SURGICAL SUPPLY — 47 items
BAG DECANTER FOR FLEXI CONT (MISCELLANEOUS) ×2 IMPLANT
BENZOIN TINCTURE PRP APPL 2/3 (GAUZE/BANDAGES/DRESSINGS) ×2 IMPLANT
BLADE SURG 11 STRL SS (BLADE) ×2 IMPLANT
BLADE SURG 15 STRL LF DISP TIS (BLADE) ×1 IMPLANT
BLADE SURG 15 STRL SS (BLADE) ×1
CANISTER SUCT 1200ML W/VALVE (MISCELLANEOUS) IMPLANT
CHLORAPREP W/TINT 26 (MISCELLANEOUS) ×2 IMPLANT
CLEANER CAUTERY TIP 5X5 PAD (MISCELLANEOUS) ×1 IMPLANT
COVER BACK TABLE 60X90IN (DRAPES) ×2 IMPLANT
COVER MAYO STAND STRL (DRAPES) ×2 IMPLANT
COVER PROBE 5X48 (MISCELLANEOUS)
COVER WAND RF STERILE (DRAPES) IMPLANT
DECANTER SPIKE VIAL GLASS SM (MISCELLANEOUS) ×2 IMPLANT
DRAPE C-ARM 42X72 X-RAY (DRAPES) ×2 IMPLANT
DRAPE LAPAROTOMY TRNSV 102X78 (DRAPES) ×2 IMPLANT
DRAPE UTILITY XL STRL (DRAPES) ×2 IMPLANT
DRSG TEGADERM 4X4.75 (GAUZE/BANDAGES/DRESSINGS) ×2 IMPLANT
ELECT REM PT RETURN 9FT ADLT (ELECTROSURGICAL) ×2
ELECTRODE REM PT RTRN 9FT ADLT (ELECTROSURGICAL) ×1 IMPLANT
GAUZE SPONGE 4X4 12PLY STRL LF (GAUZE/BANDAGES/DRESSINGS) IMPLANT
GLOVE BIO SURGEON STRL SZ7 (GLOVE) ×2 IMPLANT
GLOVE BIOGEL PI IND STRL 7.5 (GLOVE) ×1 IMPLANT
GLOVE BIOGEL PI INDICATOR 7.5 (GLOVE) ×1
GOWN STRL REUS W/ TWL LRG LVL3 (GOWN DISPOSABLE) ×1 IMPLANT
GOWN STRL REUS W/TWL LRG LVL3 (GOWN DISPOSABLE) ×1
IV KIT MINILOC 20X1 SAFETY (NEEDLE) IMPLANT
KIT CVR 48X5XPRB PLUP LF (MISCELLANEOUS) IMPLANT
KIT PORT POWER 8FR ISP CVUE (Port) ×2 IMPLANT
NDL SAFETY ECLIPSE 18X1.5 (NEEDLE) IMPLANT
NEEDLE HYPO 18GX1.5 SHARP (NEEDLE)
NEEDLE HYPO 25X1 1.5 SAFETY (NEEDLE) ×2 IMPLANT
NEEDLE SPNL 22GX3.5 QUINCKE BK (NEEDLE) IMPLANT
PACK BASIN DAY SURGERY FS (CUSTOM PROCEDURE TRAY) ×2 IMPLANT
PAD CLEANER CAUTERY TIP 5X5 (MISCELLANEOUS) ×1
PENCIL SMOKE EVACUATOR (MISCELLANEOUS) ×2 IMPLANT
SLEEVE SCD COMPRESS KNEE MED (MISCELLANEOUS) ×2 IMPLANT
SPONGE GAUZE 2X2 8PLY STRL LF (GAUZE/BANDAGES/DRESSINGS) ×2 IMPLANT
STRIP CLOSURE SKIN 1/2X4 (GAUZE/BANDAGES/DRESSINGS) ×2 IMPLANT
SUT MON AB 4-0 PC3 18 (SUTURE) ×2 IMPLANT
SUT PROLENE 2 0 CT2 30 (SUTURE) ×2 IMPLANT
SUT VIC AB 3-0 SH 27 (SUTURE) ×1
SUT VIC AB 3-0 SH 27X BRD (SUTURE) ×1 IMPLANT
SYR 5ML LUER SLIP (SYRINGE) ×2 IMPLANT
SYR CONTROL 10ML LL (SYRINGE) ×2 IMPLANT
TOWEL GREEN STERILE FF (TOWEL DISPOSABLE) ×2 IMPLANT
TUBE CONNECTING 20X1/4 (TUBING) IMPLANT
YANKAUER SUCT BULB TIP NO VENT (SUCTIONS) IMPLANT

## 2019-07-29 NOTE — Anesthesia Preprocedure Evaluation (Signed)
Anesthesia Evaluation  Patient identified by MRN, date of birth, ID band Patient awake    Reviewed: Allergy & Precautions, NPO status , Patient's Chart, lab work & pertinent test results  Airway Mallampati: II  TM Distance: >3 FB     Dental  (+) Dental Advisory Given   Pulmonary asthma ,    breath sounds clear to auscultation       Cardiovascular negative cardio ROS   Rhythm:Regular Rate:Normal     Neuro/Psych negative neurological ROS     GI/Hepatic Neg liver ROS, hiatal hernia, GERD  ,  Endo/Other  Hypothyroidism   Renal/GU negative Renal ROS     Musculoskeletal   Abdominal   Peds  Hematology negative hematology ROS (+)   Anesthesia Other Findings   Reproductive/Obstetrics                             Anesthesia Physical Anesthesia Plan  ASA: II  Anesthesia Plan: General   Post-op Pain Management:    Induction: Intravenous  PONV Risk Score and Plan: 3 and Dexamethasone, Ondansetron and Treatment may vary due to age or medical condition  Airway Management Planned: LMA  Additional Equipment:   Intra-op Plan:   Post-operative Plan: Extubation in OR  Informed Consent: I have reviewed the patients History and Physical, chart, labs and discussed the procedure including the risks, benefits and alternatives for the proposed anesthesia with the patient or authorized representative who has indicated his/her understanding and acceptance.     Dental advisory given  Plan Discussed with: CRNA  Anesthesia Plan Comments:         Anesthesia Quick Evaluation

## 2019-07-29 NOTE — Op Note (Signed)
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 She underwent further work-up that revealed a bilobed, dumbell-shaped mass with calcifications with total diameter about 3.7 cm. She also had a single abnormal lymph node in the left axilla. 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 The lymph node was biopsied and revealed metastatic disease. After consultation with oncology, the plan is to proceed with neoadjuvant chemotherapy.  She also has another area in her left breast as well as a suspicious area in the right breast that were biopsied over the weekend.  Pathology still pending but the results of that may alter her future surgical plans.  Description of procedure: The patient is brought to the operating room and placed supine position operating table with her arms down by her sides.  After adequate level of general anesthesia was obtained a small roll was placed behind her shoulders.  Her head was tilted towards the left.  We prepped the skin of the chest and the right side of her neck with ChloraPrep and draped in sterile fashion.  A timeout was taken to ensure the proper patient and proper procedure.  I interrogated the right side of her neck with the ultrasound.  I identified the internal jugular vein which was easily compressible as well as the adjacent carotid artery which was not compressible.  Using direct ultrasound guidance, I cannulated the internal jugular vein with an 18-gauge needle.  A guidewire was passed through the needle into the superior vena cava.  This was confirmed with ultrasound as well as fluoroscopy.  The needle was removed.  I  then created a subcutaneous pocket below the right clavicle.  We made a subcutaneous tunnel between the pocket and the initial insertion site.  An 8 French [place] port was then assembled and brought onto the field.  We tunneled this from the subcutaneous pocket up to the neck.  We then estimated the length of the catheter using fluoroscopy.  This was cut to the appropriate length to keep the tip of the catheter near the cavoatrial junction.  The dilator and breakaway sheath were then advanced over the wire under direct fluoroscopic guidance.  The wire and dilator were removed.  The catheter was advanced through the breakaway sheath which was then removed.  Fluoroscopy confirmed that there were no kinks along the course of the catheter.  We were able to easily aspirate blood and were easily able to flush the catheter.  The catheter was secured with 2-0 Prolene sutures.  The subcutaneous pocket was closed with 3-0 Vicryl 4-0 Monocryl.  The insertion site on the side of the neck was closed with 4-0 Monocryl.  Benzoin Steri-Strips were applied.  I then accessed the catheter with a right angle Huber needle.  We instilled concentrated heparin solution.  An occlusive dressing was placed.  The patient is scheduled to begin chemotherapy tomorrow.  Chest x-ray is pending.  Patient was extubated and brought to the recovery room in stable condition.  All sponge, instrument, and needle counts are correct.  Imogene Burn. Georgette Dover, MD, [facility]  General/ Trauma Surgery   07/29/2019 1:39 PM

## 2019-07-29 NOTE — Anesthesia Procedure Notes (Signed)
Procedure Name: LMA Insertion Performed by: Verita Lamb, CRNA Pre-anesthesia Checklist: Patient identified, Emergency Drugs available, Suction available and Patient being monitored Patient Re-evaluated:Patient Re-evaluated prior to induction Oxygen Delivery Method: Circle system utilized Preoxygenation: Pre-oxygenation with 100% oxygen Induction Type: IV induction Ventilation: Mask ventilation without difficulty LMA: LMA inserted LMA Size: 4.0 Number of attempts: 1 Airway Equipment and Method: Bite block Placement Confirmation: positive ETCO2,  CO2 detector and breath sounds checked- equal and bilateral Tube secured with: Tape Dental Injury: Teeth and Oropharynx as per pre-operative assessment

## 2019-07-29 NOTE — Discharge Instructions (Signed)
PORT-A-CATH: POST OP INSTRUCTIONS  Always review your discharge instruction sheet given to you by the facility where your surgery was performed.   1. A prescription for pain medication may be given to you upon discharge. Take your pain medication as prescribed, if needed. If narcotic pain medicine is not needed, then you make take acetaminophen (Tylenol) or ibuprofen (Advil) as needed.  2. Take your usually prescribed medications unless otherwise directed. 3. If you need a refill on your pain medication, please contact our office. All narcotic pain medicine now requires a paper prescription.  Phoned in and fax refills are no longer allowed by law.  Prescriptions will not be filled after 5 pm or on weekends.  4. You should follow a light diet for the remainder of the day after your procedure. 5. Most patients will experience some mild swelling and/or bruising in the area of the incision. It may take several days to resolve. 6. It is common to experience some constipation if taking pain medication after surgery. Increasing fluid intake and taking a stool softener (such as Colace) will usually help or prevent this problem from occurring. A mild laxative (Milk of Magnesia or Miralax) should be taken according to package directions if there are no bowel movements after 48 hours.  7. If your port is left accessed at the end of surgery (needle left in port), the dressing cannot get wet and should only by changed by a healthcare professional. When the port is no longer accessed (when the needle has been removed), follow step 7.   8. ACTIVITIES:  Limit activity involving your arms for the next 72 hours. Do no strenuous exercise or activity for 1 week. You may drive when you are no longer taking prescription pain medication, you can comfortably wear a seatbelt, and you can maneuver your car. 10.You may need to see your doctor in the office for a follow-up appointment.  Please       check with your doctor.    11.When you receive a new Port-a-Cath, you will get a product guide and        ID card.  Please keep them in case you need them.  WHEN TO CALL YOUR DOCTOR [phone]): 1. Fever over 101.0 2. Chills 3. Continued bleeding from incision 4. Increased redness and tenderness at the site 5. Shortness of breath, difficulty breathing   The clinic staff is available to answer your questions during regular business hours. Please don't hesitate to call and ask to speak to one of the nurses or medical assistants for clinical concerns. If you have a medical emergency, go to the nearest emergency room or call 911.  A surgeon from [facility] is always on call at the hospital.     For further information, please visit [email or web address]   No tylenol until 6 pm   Post Anesthesia Home Care Instructions  Activity: Get plenty of rest for the remainder of the day. A responsible individual must stay with you for 24 hours following the procedure.  For the next 24 hours, DO NOT: -Drive a car -Paediatric nurse -Drink alcoholic beverages -Take any medication unless instructed by your physician -Make any legal decisions or sign important papers.  Meals: Start with liquid foods such as gelatin or soup. Progress to regular foods as tolerated. Avoid greasy, spicy, heavy foods. If nausea and/or vomiting occur, drink only clear liquids until the nausea and/or vomiting subsides. Call your physician if vomiting continues.  Special Instructions/Symptoms: Your throat  may feel dry or sore from the anesthesia or the breathing tube placed in your throat during surgery. If this causes discomfort, gargle with warm salt water. The discomfort should disappear within 24 hours.  If you had a scopolamine patch placed behind your ear for the management of post- operative nausea and/or vomiting:  1. The medication in the patch is effective for 72 hours, after which it should be removed.  Wrap  patch in a tissue and discard in the trash. Wash hands thoroughly with soap and water. 2. You may remove the patch earlier than 72 hours if you experience unpleasant side effects which may include dry mouth, dizziness or visual disturbances. 3. Avoid touching the patch. Wash your hands with soap and water after contact with the patch.

## 2019-07-29 NOTE — Interval H&P Note (Signed)
History and Physical Interval Note:  07/29/2019 12:22 PM  Julie Thompson  has presented today for surgery, with the diagnosis of left invasive ductal carcinoma with axillary mets.  The various methods of treatment have been discussed with the patient and family. After consideration of risks, benefits and other options for treatment, the patient has consented to  Procedure(s): INSERTION PORT-A-CATH WITH ULTRASOUND (N/A) as a surgical intervention.  The patient's history has been reviewed, patient examined, no change in status, stable for surgery.  I have reviewed the patient's chart and labs.  Questions were answered to the patient's satisfaction.     [name]

## 2019-07-29 NOTE — Anesthesia Postprocedure Evaluation (Signed)
Anesthesia Post Note  Patient: Julie Thompson  Procedure(s) Performed: INSERTION PORT-A-CATH WITH ULTRASOUND (Right Chest)     Patient location during evaluation: PACU Anesthesia Type: General Level of consciousness: awake and alert Pain management: pain level controlled Vital Signs Assessment: post-procedure vital signs reviewed and stable Respiratory status: spontaneous breathing, nonlabored ventilation, respiratory function stable and patient connected to nasal cannula oxygen Cardiovascular status: blood pressure returned to baseline and stable Postop Assessment: no apparent nausea or vomiting Anesthetic complications: no    Last Vitals:  Vitals:   07/29/19 1415 07/29/19 1430  BP: (!) 174/65 (!) 160/63  Pulse: 61 75  Resp: 17 16  Temp:  36.6 C  SpO2: 94% 94%    Last Pain:  Vitals:   07/29/19 1430  TempSrc:   PainSc: 2                  Tiajuana Amass

## 2019-07-29 NOTE — Transfer of Care (Signed)
Immediate Anesthesia Transfer of Care Note  Patient: Julie Thompson  Procedure(s) Performed: INSERTION PORT-A-CATH WITH ULTRASOUND (Right Chest)  Patient Location: PACU  Anesthesia Type:General  Level of Consciousness: awake, alert  and oriented  Airway & Oxygen Therapy: Patient Spontanous Breathing and Patient connected to face mask oxygen  Post-op Assessment: Report given to RN and Post -op Vital signs reviewed and stable  Post vital signs: Reviewed and stable  Last Vitals:  Vitals Value Taken Time  BP    Temp    Pulse    Resp    SpO2      Last Pain:  Vitals:   07/29/19 1220  TempSrc: Oral  PainSc: 0-No pain         Complications: No apparent anesthesia complications

## 2019-07-30 ENCOUNTER — Inpatient Hospital Stay: Payer: Medicare Other

## 2019-07-30 ENCOUNTER — Encounter: Payer: Self-pay | Admitting: *Deleted

## 2019-07-30 ENCOUNTER — Encounter: Payer: Self-pay | Admitting: Oncology

## 2019-07-30 ENCOUNTER — Other Ambulatory Visit: Payer: Self-pay

## 2019-07-30 VITALS — BP 134/57 | HR 73 | Temp 97.9°F | Resp 16

## 2019-07-30 DIAGNOSIS — Z791 Long term (current) use of non-steroidal anti-inflammatories (NSAID): Secondary | ICD-10-CM | POA: Diagnosis not present

## 2019-07-30 DIAGNOSIS — Z5189 Encounter for other specified aftercare: Secondary | ICD-10-CM | POA: Diagnosis not present

## 2019-07-30 DIAGNOSIS — C50512 Malignant neoplasm of lower-outer quadrant of left female breast: Secondary | ICD-10-CM | POA: Diagnosis not present

## 2019-07-30 DIAGNOSIS — Z90722 Acquired absence of ovaries, bilateral: Secondary | ICD-10-CM | POA: Diagnosis not present

## 2019-07-30 DIAGNOSIS — Z5111 Encounter for antineoplastic chemotherapy: Secondary | ICD-10-CM | POA: Diagnosis not present

## 2019-07-30 DIAGNOSIS — Z7952 Long term (current) use of systemic steroids: Secondary | ICD-10-CM | POA: Diagnosis not present

## 2019-07-30 DIAGNOSIS — C773 Secondary and unspecified malignant neoplasm of axilla and upper limb lymph nodes: Secondary | ICD-10-CM | POA: Diagnosis not present

## 2019-07-30 DIAGNOSIS — Z171 Estrogen receptor negative status [ER-]: Secondary | ICD-10-CM | POA: Diagnosis not present

## 2019-07-30 DIAGNOSIS — Z90710 Acquired absence of both cervix and uterus: Secondary | ICD-10-CM | POA: Diagnosis not present

## 2019-07-30 DIAGNOSIS — J45909 Unspecified asthma, uncomplicated: Secondary | ICD-10-CM | POA: Diagnosis not present

## 2019-07-30 DIAGNOSIS — Z7982 Long term (current) use of aspirin: Secondary | ICD-10-CM | POA: Diagnosis not present

## 2019-07-30 DIAGNOSIS — Z800 Family history of malignant neoplasm of digestive organs: Secondary | ICD-10-CM | POA: Diagnosis not present

## 2019-07-30 DIAGNOSIS — Z803 Family history of malignant neoplasm of breast: Secondary | ICD-10-CM | POA: Diagnosis not present

## 2019-07-30 DIAGNOSIS — Z79899 Other long term (current) drug therapy: Secondary | ICD-10-CM | POA: Diagnosis not present

## 2019-07-30 DIAGNOSIS — Z8041 Family history of malignant neoplasm of ovary: Secondary | ICD-10-CM | POA: Diagnosis not present

## 2019-07-30 DIAGNOSIS — Z9079 Acquired absence of other genital organ(s): Secondary | ICD-10-CM | POA: Diagnosis not present

## 2019-07-30 LAB — CBC WITH DIFFERENTIAL/PLATELET
Abs Immature Granulocytes: 0.02 10*3/uL (ref 0.00–0.07)
Basophils Absolute: 0 10*3/uL (ref 0.0–0.1)
Basophils Relative: 0 %
Eosinophils Absolute: 0 10*3/uL (ref 0.0–0.5)
Eosinophils Relative: 0 %
HCT: 37.7 % (ref 36.0–46.0)
Hemoglobin: 12.3 g/dL (ref 12.0–15.0)
Immature Granulocytes: 0 %
Lymphocytes Relative: 13 %
Lymphs Abs: 0.8 10*3/uL (ref 0.7–4.0)
MCH: 31.4 pg (ref 26.0–34.0)
MCHC: 32.6 g/dL (ref 30.0–36.0)
MCV: 96.2 fL (ref 80.0–100.0)
Monocytes Absolute: 0.5 10*3/uL (ref 0.1–1.0)
Monocytes Relative: 8 %
Neutro Abs: 5.3 10*3/uL (ref 1.7–7.7)
Neutrophils Relative %: 79 %
Platelets: 246 10*3/uL (ref 150–400)
RBC: 3.92 MIL/uL (ref 3.87–5.11)
RDW: 13.3 % (ref 11.5–15.5)
WBC: 6.7 10*3/uL (ref 4.0–10.5)
nRBC: 0 % (ref 0.0–0.2)

## 2019-07-30 LAB — COMPREHENSIVE METABOLIC PANEL
ALT: 11 U/L (ref 0–44)
AST: 13 U/L — ABNORMAL LOW (ref 15–41)
Albumin: 3.7 g/dL (ref 3.5–5.0)
Alkaline Phosphatase: 72 U/L (ref 38–126)
Anion gap: 11 (ref 5–15)
BUN: 13 mg/dL (ref 8–23)
CO2: 23 mmol/L (ref 22–32)
Calcium: 9.1 mg/dL (ref 8.9–10.3)
Chloride: 108 mmol/L (ref 98–111)
Creatinine, Ser: 0.91 mg/dL (ref 0.44–1.00)
GFR calc Af Amer: >60 mL/min (ref >60)
GFR calc non Af Amer: >60 mL/min (ref >60)
Glucose, Bld: 137 mg/dL — ABNORMAL HIGH (ref 70–99)
Potassium: 3.8 mmol/L (ref 3.5–5.1)
Sodium: 142 mmol/L (ref 135–145)
Total Bilirubin: 0.4 mg/dL (ref 0.3–1.2)
Total Protein: 6.5 g/dL (ref 6.5–8.1)

## 2019-07-30 MED ORDER — PALONOSETRON HCL INJECTION 0.25 MG/5ML
INTRAVENOUS | Status: AC
  Filled 2019-07-30: qty 5

## 2019-07-30 MED ORDER — PALONOSETRON HCL INJECTION 0.25 MG/5ML
0.2500 mg | Freq: Once | INTRAVENOUS | Status: AC
  Administered 2019-07-30: 0.25 mg via INTRAVENOUS

## 2019-07-30 MED ORDER — DOXORUBICIN HCL CHEMO IV INJECTION 2 MG/ML
60.0000 mg/m2 | Freq: Once | INTRAVENOUS | Status: AC
  Administered 2019-07-30: 114 mg via INTRAVENOUS
  Filled 2019-07-30: qty 57

## 2019-07-30 MED ORDER — DEXAMETHASONE SODIUM PHOSPHATE 10 MG/ML IJ SOLN
INTRAMUSCULAR | Status: AC
  Filled 2019-07-30: qty 1

## 2019-07-30 MED ORDER — DEXAMETHASONE SODIUM PHOSPHATE 10 MG/ML IJ SOLN
10.0000 mg | Freq: Once | INTRAMUSCULAR | Status: AC
  Administered 2019-07-30: 10 mg via INTRAVENOUS

## 2019-07-30 MED ORDER — SODIUM CHLORIDE 0.9% FLUSH
10.0000 mL | INTRAVENOUS | Status: DC | PRN
  Administered 2019-07-30: 10 mL
  Filled 2019-07-30: qty 10

## 2019-07-30 MED ORDER — SODIUM CHLORIDE 0.9 % IV SOLN
Freq: Once | INTRAVENOUS | Status: AC
  Filled 2019-07-30: qty 250

## 2019-07-30 MED ORDER — HEPARIN SOD (PORK) LOCK FLUSH 100 UNIT/ML IV SOLN
500.0000 [IU] | Freq: Once | INTRAVENOUS | Status: AC | PRN
  Administered 2019-07-30: 500 [IU]
  Filled 2019-07-30: qty 5

## 2019-07-30 MED ORDER — SODIUM CHLORIDE 0.9 % IV SOLN
150.0000 mg | Freq: Once | INTRAVENOUS | Status: AC
  Administered 2019-07-30: 150 mg via INTRAVENOUS
  Filled 2019-07-30: qty 150

## 2019-07-30 MED ORDER — SODIUM CHLORIDE 0.9 % IV SOLN
600.0000 mg/m2 | Freq: Once | INTRAVENOUS | Status: AC
  Administered 2019-07-30: 1140 mg via INTRAVENOUS
  Filled 2019-07-30: qty 57

## 2019-07-30 NOTE — Progress Notes (Signed)
Met w/ pt to introduce myself as her Arboriculturist.  Unfortunately there aren't any foundations offering copay assistance for her Dx and the type of ins she has.  I offered the [employer], went over what it covers and gave her the income requirement.  She would like to review her income and if she would like to apply she will bring her proof of income on 08/01/19.

## 2019-07-30 NOTE — Patient Instructions (Signed)
[facility] Discharge Instructions for Patients Receiving Chemotherapy  Today you received the following chemotherapy agents Doxorubicin; Cytoxin  To help prevent nausea and vomiting after your treatment, we encourage you to take your nausea medication as directed   If you develop nausea and vomiting that is not controlled by your nausea medication, call the clinic.   BELOW ARE SYMPTOMS THAT SHOULD BE REPORTED IMMEDIATELY:  *FEVER GREATER THAN 100.5 F  *CHILLS WITH OR WITHOUT FEVER  NAUSEA AND VOMITING THAT IS NOT CONTROLLED WITH YOUR NAUSEA MEDICATION  *UNUSUAL SHORTNESS OF BREATH  *UNUSUAL BRUISING OR BLEEDING  TENDERNESS IN MOUTH AND THROAT WITH OR WITHOUT PRESENCE OF ULCERS  *URINARY PROBLEMS  *BOWEL PROBLEMS  UNUSUAL RASH Items with * indicate a potential emergency and should be followed up as soon as possible.  Feel free to call the clinic should you have any questions or concerns. The clinic phone number is [phone].  Please show the [facility] at check-in to the Emergency Department and triage nurse.  Doxorubicin injection What is this medicine? DOXORUBICIN (dox oh ROO bi sin) is a chemotherapy drug. It is used to treat many kinds of cancer like leukemia, lymphoma, neuroblastoma, sarcoma, and Wilms' tumor. It is also used to treat bladder cancer, breast cancer, lung cancer, ovarian cancer, stomach cancer, and thyroid cancer. This medicine may be used for other purposes; ask your health care provider or pharmacist if you have questions. COMMON BRAND NAME(S): Adriamycin, Adriamycin PFS, Adriamycin RDF, Rubex What should I tell my health care provider before I take this medicine? They need to know if you have any of these conditions:  heart disease  history of low blood counts caused by a medicine  liver disease  recent or ongoing radiation therapy  an unusual or allergic reaction to doxorubicin, other chemotherapy agents, other  medicines, foods, dyes, or preservatives  pregnant or trying to get pregnant  breast-feeding How should I use this medicine? This drug is given as an infusion into a vein. It is administered in a hospital or clinic by a specially trained health care professional. If you have pain, swelling, burning or any unusual feeling around the site of your injection, tell your health care professional right away. Talk to your pediatrician regarding the use of this medicine in children. Special care may be needed. Overdosage: If you think you have taken too much of this medicine contact a poison control center or emergency room at once. NOTE: This medicine is only for you. Do not share this medicine with others. What if I miss a dose? It is important not to miss your dose. Call your doctor or health care professional if you are unable to keep an appointment. What may interact with this medicine? This medicine may interact with the following medications:  6-mercaptopurine  paclitaxel  phenytoin  St. John's Wort  trastuzumab  verapamil This list may not describe all possible interactions. Give your health care provider a list of all the medicines, herbs, non-prescription drugs, or dietary supplements you use. Also tell them if you smoke, drink alcohol, or use illegal drugs. Some items may interact with your medicine. What should I watch for while using this medicine? This drug may make you feel generally unwell. This is not uncommon, as chemotherapy can affect healthy cells as well as cancer cells. Report any side effects. Continue your course of treatment even though you feel ill unless your doctor tells you to stop. There is a maximum amount of  this medicine you should receive throughout your life. The amount depends on the medical condition being treated and your overall health. Your doctor will watch how much of this medicine you receive in your lifetime. Tell your doctor if you have taken this  medicine before. You may need blood work done while you are taking this medicine. Your urine may turn red for a few days after your dose. This is not blood. If your urine is dark or brown, call your doctor. In some cases, you may be given additional medicines to help with side effects. Follow all directions for their use. Call your doctor or health care professional for advice if you get a fever, chills or sore throat, or other symptoms of a cold or flu. Do not treat yourself. This drug decreases your body's ability to fight infections. Try to avoid being around people who are sick. This medicine may increase your risk to bruise or bleed. Call your doctor or health care professional if you notice any unusual bleeding. Talk to your doctor about your risk of cancer. You may be more at risk for certain types of cancers if you take this medicine. Do not become pregnant while taking this medicine or for 6 months after stopping it. Women should inform their doctor if they wish to become pregnant or think they might be pregnant. Men should not father a child while taking this medicine and for 6 months after stopping it. There is a potential for serious side effects to an unborn child. Talk to your health care professional or pharmacist for more information. Do not breast-feed an infant while taking this medicine. This medicine has caused ovarian failure in some women and reduced sperm counts in some men This medicine may interfere with the ability to have a child. Talk with your doctor or health care professional if you are concerned about your fertility. This medicine may cause a decrease in Co-Enzyme Q-10. You should make sure that you get enough Co-Enzyme Q-10 while you are taking this medicine. Discuss the foods you eat and the vitamins you take with your health care professional. What side effects may I notice from receiving this medicine? Side effects that you should report to your doctor or health care  professional as soon as possible:  allergic reactions like skin rash, itching or hives, swelling of the face, lips, or tongue  breathing problems  chest pain  fast or irregular heartbeat  low blood counts - this medicine may decrease the number of white blood cells, red blood cells and platelets. You may be at increased risk for infections and bleeding.  pain, redness, or irritation at site where injected  signs of infection - fever or chills, cough, sore throat, pain or difficulty passing urine  signs of decreased platelets or bleeding - bruising, pinpoint red spots on the skin, black, tarry stools, blood in the urine  swelling of the ankles, feet, hands  tiredness  weakness Side effects that usually do not require medical attention (report to your doctor or health care professional if they continue or are bothersome):  diarrhea  hair loss  mouth sores  nail discoloration or damage  nausea  red colored urine  vomiting This list may not describe all possible side effects. Call your doctor for medical advice about side effects. You may report side effects to FDA at 1-800-FDA-1088. Where should I keep my medicine? This drug is given in a hospital or clinic and will not be stored at home. NOTE:  This sheet is a summary. It may not cover all possible information. If you have questions about this medicine, talk to your doctor, pharmacist, or health care provider.  2020 [employer]vier/Gold Standard (2017-02-01 11:01:26)  Cyclophosphamide Injection What is this medicine? CYCLOPHOSPHAMIDE (sye kloe FOSS fa mide) is a chemotherapy drug. It slows the growth of cancer cells. This medicine is used to treat many types of cancer like lymphoma, myeloma, leukemia, breast cancer, and ovarian cancer, to name a few. This medicine may be used for other purposes; ask your health care provider or pharmacist if you have questions. COMMON BRAND NAME(S): Cytoxan, Neosar What should I tell my health  care provider before I take this medicine? They need to know if you have any of these conditions:  heart disease  history of irregular heartbeat  infection  kidney disease  liver disease  low blood counts, like white cells, platelets, or red blood cells  on hemodialysis  recent or ongoing radiation therapy  scarring or thickening of the lungs  trouble passing urine  an unusual or allergic reaction to cyclophosphamide, other medicines, foods, dyes, or preservatives  pregnant or trying to get pregnant  breast-feeding How should I use this medicine? This drug is usually given as an injection into a vein or muscle or by infusion into a vein. It is administered in a hospital or clinic by a specially trained health care professional. Talk to your pediatrician regarding the use of this medicine in children. Special care may be needed. Overdosage: If you think you have taken too much of this medicine contact a poison control center or emergency room at once. NOTE: This medicine is only for you. Do not share this medicine with others. What if I miss a dose? It is important not to miss your dose. Call your doctor or health care professional if you are unable to keep an appointment. What may interact with this medicine?  amphotericin B  azathioprine  certain antivirals for HIV or hepatitis  certain medicines for blood pressure, heart disease, irregular heart beat  certain medicines that treat or prevent blood clots like warfarin  certain other medicines for cancer  cyclosporine  etanercept  indomethacin  medicines that relax muscles for surgery  medicines to increase blood counts  metronidazole This list may not describe all possible interactions. Give your health care provider a list of all the medicines, herbs, non-prescription drugs, or dietary supplements you use. Also tell them if you smoke, drink alcohol, or use illegal drugs. Some items may interact with your  medicine. What should I watch for while using this medicine? Your condition will be monitored carefully while you are receiving this medicine. You may need blood work done while you are taking this medicine. Drink water or other fluids as directed. Urinate often, even at night. Some products may contain alcohol. Ask your health care professional if this medicine contains alcohol. Be sure to tell all health care professionals you are taking this medicine. Certain medicines, like metronidazole and disulfiram, can cause an unpleasant reaction when taken with alcohol. The reaction includes flushing, headache, nausea, vomiting, sweating, and increased thirst. The reaction can last from 30 minutes to several hours. Do not become pregnant while taking this medicine or for 1 year after stopping it. Women should inform their health care professional if they wish to become pregnant or think they might be pregnant. Men should not father a child while taking this medicine and for 4 months after stopping it. There is potential   for serious side effects to an unborn child. Talk to your health care professional for more information. Do not breast-feed an infant while taking this medicine or for 1 week after stopping it. This medicine has caused ovarian failure in some women. This medicine may make it more difficult to get pregnant. Talk to your health care professional if you are concerned about your fertility. This medicine has caused decreased sperm counts in some men. This may make it more difficult to father a child. Talk to your health care professional if you are concerned about your fertility. Call your health care professional for advice if you get a fever, chills, or sore throat, or other symptoms of a cold or flu. Do not treat yourself. This medicine decreases your body's ability to fight infections. Try to avoid being around people who are sick. Avoid taking medicines that contain aspirin, acetaminophen,  ibuprofen, naproxen, or ketoprofen unless instructed by your health care professional. These medicines may hide a fever. Talk to your health care professional about your risk of cancer. You may be more at risk for certain types of cancer if you take this medicine. If you are going to need surgery or other procedure, tell your health care professional that you are using this medicine. Be careful brushing or flossing your teeth or using a toothpick because you may get an infection or bleed more easily. If you have any dental work done, tell your dentist you are receiving this medicine. What side effects may I notice from receiving this medicine? Side effects that you should report to your doctor or health care professional as soon as possible:  allergic reactions like skin rash, itching or hives, swelling of the face, lips, or tongue  breathing problems  nausea, vomiting  signs and symptoms of bleeding such as bloody or black, tarry stools; red or dark brown urine; spitting up blood or brown material that looks like coffee grounds; red spots on the skin; unusual bruising or bleeding from the eyes, gums, or nose  signs and symptoms of heart failure like fast, irregular heartbeat, sudden weight gain; swelling of the ankles, feet, hands  signs and symptoms of infection like fever; chills; cough; sore throat; pain or trouble passing urine  signs and symptoms of kidney injury like trouble passing urine or change in the amount of urine  signs and symptoms of liver injury like dark yellow or brown urine; general ill feeling or flu-like symptoms; light-colored stools; loss of appetite; nausea; right upper belly pain; unusually weak or tired; yellowing of the eyes or skin Side effects that usually do not require medical attention (report to your doctor or health care professional if they continue or are bothersome):  confusion  decreased hearing  diarrhea  facial flushing  hair  loss  headache  loss of appetite  missed menstrual periods  signs and symptoms of low red blood cells or anemia such as unusually weak or tired; feeling faint or lightheaded; falls  skin discoloration This list may not describe all possible side effects. Call your doctor for medical advice about side effects. You may report side effects to FDA at 1-800-FDA-1088. Where should I keep my medicine? This drug is given in a hospital or clinic and will not be stored at home. NOTE: This sheet is a summary. It may not cover all possible information. If you have questions about this medicine, talk to your doctor, pharmacist, or health care provider.  2020 [employer]/Gold Standard (2019-03-25 09:53:29)

## 2019-07-31 ENCOUNTER — Encounter (HOSPITAL_COMMUNITY): Payer: Self-pay

## 2019-07-31 ENCOUNTER — Other Ambulatory Visit: Payer: Self-pay | Admitting: Oncology

## 2019-07-31 ENCOUNTER — Encounter (HOSPITAL_COMMUNITY)
Admission: RE | Admit: 2019-07-31 | Discharge: 2019-07-31 | Disposition: A | Discharge status: Home / Self Care | Payer: Medicare Other | Source: Ambulatory Visit | Attending: Oncology | Admitting: Oncology

## 2019-07-31 ENCOUNTER — Ambulatory Visit (HOSPITAL_COMMUNITY)
Admission: RE | Admit: 2019-07-31 | Discharge: 2019-07-31 | Disposition: A | Discharge status: Home / Self Care | Payer: Medicare Other | Source: Ambulatory Visit | Attending: Oncology | Admitting: Oncology

## 2019-07-31 ENCOUNTER — Telehealth: Payer: Self-pay | Admitting: *Deleted

## 2019-07-31 DIAGNOSIS — R918 Other nonspecific abnormal finding of lung field: Secondary | ICD-10-CM | POA: Diagnosis not present

## 2019-07-31 DIAGNOSIS — C50912 Malignant neoplasm of unspecified site of left female breast: Secondary | ICD-10-CM | POA: Diagnosis not present

## 2019-07-31 DIAGNOSIS — C50512 Malignant neoplasm of lower-outer quadrant of left female breast: Secondary | ICD-10-CM | POA: Diagnosis not present

## 2019-07-31 DIAGNOSIS — Z171 Estrogen receptor negative status [ER-]: Secondary | ICD-10-CM | POA: Insufficient documentation

## 2019-07-31 HISTORY — DX: Malignant (primary) neoplasm, unspecified: C80.1

## 2019-07-31 MED ORDER — SODIUM CHLORIDE (PF) 0.9 % IJ SOLN
INTRAMUSCULAR | Status: AC
  Filled 2019-07-31: qty 50

## 2019-07-31 MED ORDER — TECHNETIUM TC 99M MEDRONATE IV KIT
19.8000 | PACK | Freq: Once | INTRAVENOUS | Status: AC
  Administered 2019-07-31: 19.8 via INTRAVENOUS

## 2019-07-31 MED ORDER — IOHEXOL 300 MG/ML  SOLN
75.0000 mL | Freq: Once | INTRAMUSCULAR | Status: AC | PRN
  Administered 2019-07-31: 75 mL via INTRAVENOUS

## 2019-07-31 NOTE — Progress Notes (Signed)
I called Julie Thompson and let her know results of her scans which are favorable

## 2019-08-01 ENCOUNTER — Encounter: Payer: Self-pay | Admitting: Oncology

## 2019-08-01 ENCOUNTER — Other Ambulatory Visit: Payer: Self-pay

## 2019-08-01 ENCOUNTER — Encounter: Payer: Self-pay | Admitting: *Deleted

## 2019-08-01 ENCOUNTER — Inpatient Hospital Stay: Payer: Medicare Other

## 2019-08-01 VITALS — BP 149/61 | HR 75 | Temp 98.0°F | Resp 16

## 2019-08-01 DIAGNOSIS — Z803 Family history of malignant neoplasm of breast: Secondary | ICD-10-CM | POA: Diagnosis not present

## 2019-08-01 DIAGNOSIS — C50512 Malignant neoplasm of lower-outer quadrant of left female breast: Secondary | ICD-10-CM | POA: Diagnosis not present

## 2019-08-01 DIAGNOSIS — Z90710 Acquired absence of both cervix and uterus: Secondary | ICD-10-CM | POA: Diagnosis not present

## 2019-08-01 DIAGNOSIS — Z7982 Long term (current) use of aspirin: Secondary | ICD-10-CM | POA: Diagnosis not present

## 2019-08-01 DIAGNOSIS — C773 Secondary and unspecified malignant neoplasm of axilla and upper limb lymph nodes: Secondary | ICD-10-CM | POA: Diagnosis not present

## 2019-08-01 DIAGNOSIS — Z7952 Long term (current) use of systemic steroids: Secondary | ICD-10-CM | POA: Diagnosis not present

## 2019-08-01 DIAGNOSIS — Z5189 Encounter for other specified aftercare: Secondary | ICD-10-CM | POA: Diagnosis not present

## 2019-08-01 DIAGNOSIS — Z90722 Acquired absence of ovaries, bilateral: Secondary | ICD-10-CM | POA: Diagnosis not present

## 2019-08-01 DIAGNOSIS — Z171 Estrogen receptor negative status [ER-]: Secondary | ICD-10-CM | POA: Diagnosis not present

## 2019-08-01 DIAGNOSIS — Z79899 Other long term (current) drug therapy: Secondary | ICD-10-CM | POA: Diagnosis not present

## 2019-08-01 DIAGNOSIS — Z791 Long term (current) use of non-steroidal anti-inflammatories (NSAID): Secondary | ICD-10-CM | POA: Diagnosis not present

## 2019-08-01 DIAGNOSIS — Z9079 Acquired absence of other genital organ(s): Secondary | ICD-10-CM | POA: Diagnosis not present

## 2019-08-01 DIAGNOSIS — Z8041 Family history of malignant neoplasm of ovary: Secondary | ICD-10-CM | POA: Diagnosis not present

## 2019-08-01 DIAGNOSIS — Z5111 Encounter for antineoplastic chemotherapy: Secondary | ICD-10-CM | POA: Diagnosis not present

## 2019-08-01 DIAGNOSIS — J45909 Unspecified asthma, uncomplicated: Secondary | ICD-10-CM | POA: Diagnosis not present

## 2019-08-01 DIAGNOSIS — Z800 Family history of malignant neoplasm of digestive organs: Secondary | ICD-10-CM | POA: Diagnosis not present

## 2019-08-01 MED ORDER — PEGFILGRASTIM-JMDB 6 MG/0.6ML ~~LOC~~ SOSY
PREFILLED_SYRINGE | SUBCUTANEOUS | Status: AC
  Filled 2019-08-01: qty 0.6

## 2019-08-01 MED ORDER — PEGFILGRASTIM-JMDB 6 MG/0.6ML ~~LOC~~ SOSY
6.0000 mg | PREFILLED_SYRINGE | Freq: Once | SUBCUTANEOUS | Status: AC
  Administered 2019-08-01: 6 mg via SUBCUTANEOUS

## 2019-08-01 NOTE — Patient Instructions (Signed)

## 2019-08-01 NOTE — Progress Notes (Signed)
Pt provided her proof of income but unfortunately she doesn't qualify.  She exceeds the income requirement.

## 2019-08-02 DIAGNOSIS — M170 Bilateral primary osteoarthritis of knee: Secondary | ICD-10-CM | POA: Diagnosis not present

## 2019-08-05 NOTE — Progress Notes (Signed)
[place]  [phone]) [phone] [phone]) [phone]     ID: Julie Thompson DOB: 1946/06/23  MR#: [id number]  MHD#:[id number]  Patient Care Team: Julie Cha, MD as PCP - General (Internal Medicine) Julie Germany, RN as Oncology Nurse Navigator Julie Kaufmann, RN as Oncology Nurse Navigator Julie Gibson, MD as Attending Physician (Radiation Oncology) Julie Mesa, MD as Consulting Physician (General Surgery) Julie Thompson, Julie Dad, MD as Consulting Physician (Oncology) Julie Cruel, MD OTHER MD:  CHIEF COMPLAINT: triple negative breast cancer  CURRENT TREATMENT: Neoadjuvant chemotherapy   INTERVAL HISTORY: Julie Thompson returns today for follow up of her triple negative breast cancer accompanied by her husband Julie Thompson.  She underwent additional biopsies on 07/26/2019. Pathology [id number]) showed: 1. Right Breast, 9 o'clock  - adenosis and fibrocystic changes with calcifications 2. Right Breast, anterior lower-outer quadrant  - atypical ductal hyperplasia  - adenosis and fibrocystic changes 3. Left Breast, 8:30  - atypical ductal hyperplasia  - usual ductal hyperplasia  Since her last visit, she underwent port placement on 07/29/2019 under Dr. Georgette Thompson.  She tolerated this well although she still has the Steri-Strips in place because she says she is afraid to remove them  She started neoadjuvant chemotherapy consisting of cyclophosphamide and doxorubicin in dose dense fashion x4 on 07/30/2019.  She did remarkably well with this as discussed in the review of systems below.  Her most recent echocardiogram on 07/25/2019 showed an ejection fraction of 60-65%.  She underwent chest CT on 07/31/2019, which showed: a left axillary lymph node at the upper limits of normal in size; postoperative changes in the left breast with additional nodules in the inferomedial left breast; no evidence of intrathoracic metastatic disease; likely benign millimetric subpleural  left lung nodules.  There was aortic atherosclerosis.  Bone scan performed the same day showed no evidence of osseous metastatic disease.   REVIEW OF SYSTEMS: Julie Thompson did very well on day 1 of chemo and actually went for a walk.  She had her bone scan on the next day and she felt her knees were weak that day and the next.  That third day she had her shot and after that she felt a bit fluids although her knee pain for which she was going to see Julie Thompson for and get a shot, had resolved, likely because of the steroids she received for premed.  Then on day 5 Saturday she was achy from the shot and that persisted for a day.  She only took Tylenol for this.  For the next day she had some nausea and even today she can easily feel nauseated just thinking about it although she never did vomit.  She had no fever.  She was a little constipated but did not want to take anything to help her bowels move because of her history of IBS.  A detailed review of systems today was otherwise stable.   HISTORY OF CURRENT ILLNESS: From the original intake note:  Julie Thompson had routine screening mammography on 06/21/2019 showing a possible abnormality in the left breast. She underwent left diagnostic mammography with tomography and left breast ultrasonography at [facility] on 07/01/2019 showing: breast density category C; either 2 adjacent masses or 1 complex dumbbell-shaped mass containing calcifications in the left breast at 6 o'clock, the largest of which measures 2.1 cm; chest wall invasion not excluded; 1-2 mm group of calcifications just anterior to the mass(es); single abnormal lymph node in the left axilla; vascular calcifications in anterior left  breast.  Accordingly on 07/09/2019 she proceeded to biopsy of the left breast areas in question. The pathology from this procedure ([id number]) showed:  1. Left Breast, 5:30, posterior  - invasive ductal carcinoma with necrosis, grade 3  - Prognostic indicators  significant for: estrogen receptor, 0% negative and progesterone receptor, 0% negative. Proliferation marker Ki67 at 60%. HER2 negative by immunohistochemistry (0). 2. Left Breast, 5:30, posterior  - invasive ductal carcinoma with necrosis, grade 3  - ductal carcinoma in situ, intermediate grade 3. Left Axilla, lymph node (1)  - metastatic carcinoma involving a lymph node  And additional biopsy of the anterior vascular calcifications in the left breast was performed on 07/10/2019. Pathology [id number]) showed: fibrocystic changes with calcifications; no malignancy identified.  This was felt to be concordant.  The patient's subsequent history is as detailed below.   PAST MEDICAL HISTORY: Past Medical History:  Diagnosis Date  . Asthma    no problems now  . Family history of basal cell carcinoma   . Family history of breast cancer   . Family history of colon cancer   . Family history of kidney cancer   . Family history of leukemia   . Family history of peritoneal cancer   . GERD (gastroesophageal reflux disease)   . H/O hiatal hernia   . Hyperlipidemia   . Hypothyroidism   . IBS (irritable bowel syndrome)   . lt breast ca dx'd 07/10/19    PAST SURGICAL HISTORY: Past Surgical History:  Procedure Laterality Date  . ABDOMINAL HYSTERECTOMY     age 34  . BREAST SURGERY    . DILATION AND CURETTAGE OF UTERUS     2 miscarriages age 68 & 56  . FLEXIBLE SIGMOIDOSCOPY N/A 10/22/2013   Procedure: FLEXIBLE SIGMOIDOSCOPY;  Surgeon: Garlan Fair, MD;  Location: [facility];  Service: Endoscopy;  Laterality: N/A;  . KNEE ARTHROSCOPY Left   . PORTACATH PLACEMENT Right 07/29/2019   Procedure: INSERTION PORT-A-CATH WITH ULTRASOUND;  Surgeon: Julie Mesa, MD;  Location: [facility];  Service: General;  Laterality: Right;  . TONSILLECTOMY     age 59    FAMILY HISTORY: Family History  Problem Relation Age of Onset  . Breast cancer Maternal Aunt        dx. in her 36s  .  Cancer Mother 86       peritoneal cancer, death certificate lists ovarian cancer  . Colon cancer Paternal Uncle        dx. late 6s or early 46s  . Basal cell carcinoma Sister 56       a second diagnosed in her 63s  . Kidney cancer Maternal Grandmother 6  . Diabetes Paternal Grandmother   . Heart Problems Paternal Grandmother   . Leukemia Paternal Uncle        dx. early 54s   Patient's father is currently living at age 87 as of 07/21/19. Patient's mother died from ovarian cancer at age 88. She was diagnosed at age 50. The patient reports breast cancer in a maternal aunt, diagnosed in her 65's. The patient also noted colon cancer in a paternal uncle and kidney cancer in her maternal grandmother.  She has 1 sister.   GYNECOLOGIC HISTORY:  No LMP recorded. Patient has had a hysterectomy. Menarche: [age] years old Age at first live birth: 74 years old [place] P 2 LMP age 38 (with hysterectomy) Contraceptive: never used HRT used for 10-12 years  Hysterectomy? Yes, age 15 BSO? 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  Husband Julie Saxon "Julie Thompson" Laurann Montana Sr. is a retired Psychologist, prison and probation services. She lives at home with husband Julie Thompson. She has two children from a prior marriage. Son Julie Thompson, age 67, works as a Games developer in [place], [place]. Son Julie Thompson, age 57, works as a Programme researcher, broadcasting/film/video in [place], [place]. The patient has 4 grandchildren and 4 great-grandchildren. She attends [employer].    ADVANCED DIRECTIVES: In the absence of any documentation to the contrary, the patient's spouse is their HCPOA.    HEALTH MAINTENANCE: Social History   Tobacco Use  . Smoking status: Never Smoker  . Smokeless tobacco: Never Used  Substance Use Topics  . Alcohol use: No  . Drug use: No     Colonoscopy: 09/2013, with CT virtual 11/2013  PAP: none on file, s/p hysterectomy  Bone density: 04/2001, -0.5   Allergies  Allergen Reactions  .  Sulfa Antibiotics Rash  . Penicillins Hives    Current Outpatient Medications  Medication Sig Dispense Refill  . aspirin EC 81 MG tablet Take 81 mg by mouth daily.    Marland Kitchen atorvastatin (LIPITOR) 20 MG tablet Take 20 mg by mouth daily.    . Cholecalciferol (VITAMIN D) 2000 UNITS tablet Take 2,000 Units by mouth daily.    . ciprofloxacin (CIPRO) 500 MG tablet Take 1 tablet (500 mg total) by mouth 2 (two) times daily. For 5 days starting 6 days after chemotherapy 40 tablet 1  . co-enzyme Q-10 30 MG capsule Take 20 mg by mouth 3 (three) times daily.    Marland Kitchen dexamethasone (DECADRON) 4 MG tablet Take 2 tablets by mouth daily starting the day after Carboplatin and Cytoxan x 3 days. Take with food. 30 tablet 1  . esomeprazole (NEXIUM) 20 MG capsule Take 20 mg by mouth daily at 12 noon.    . famotidine (PEPCID) 20 MG tablet Take 40 mg by mouth 2 (two) times daily.    Marland Kitchen FIBER PO Take 1 tablet by mouth daily.    Marland Kitchen HYDROcodone-acetaminophen (NORCO/VICODIN) 5-325 MG tablet Take 1 tablet by mouth every 6 (six) hours as needed for moderate pain. 15 tablet 0  . levothyroxine (SYNTHROID) 50 MCG tablet Take 50 mcg by mouth daily.    Marland Kitchen lidocaine-prilocaine (EMLA) cream Apply 1 application topically as needed. 30 g 0  . lidocaine-prilocaine (EMLA) cream Apply to affected area once 30 g 3  . loratadine (CLARITIN) 10 MG tablet Take 10 mg by mouth daily.    Marland Kitchen loratadine (CLARITIN) 10 MG tablet Take 1 tablet (10 mg total) by mouth daily. 60 tablet 1  . LORazepam (ATIVAN) 0.5 MG tablet Take 1 tablet (0.5 mg total) by mouth at bedtime as needed (Nausea or vomiting). 30 tablet 0  . Multiple Vitamin (MULTIVITAMIN WITH MINERALS) TABS tablet Take 1 tablet by mouth daily.    . nabumetone (RELAFEN) 500 MG tablet Take 500 mg by mouth 2 (two) times daily as needed.    . Omega-3 Fatty Acids (FISH OIL) 1200 MG CAPS Take 1,200 mg by mouth daily.    . ondansetron (ZOFRAN) 8 MG tablet Take 1 tablet (8 mg total) by mouth every 8 (eight)  hours as needed for nausea or vomiting. 20 tablet 0  . prochlorperazine (COMPAZINE) 10 MG tablet Take 1 tablet (10 mg total) by mouth every 6 (six) hours as needed for nausea or vomiting. 30 tablet 0  . prochlorperazine (COMPAZINE) 10 MG tablet Take 1 tablet (  10 mg total) by mouth every 6 (six) hours as needed (Nausea or vomiting). 30 tablet 1   No current facility-administered medications for this visit.    OBJECTIVE: Middle-aged white woman in no acute distress  Vitals:   08/06/19 1514  BP: (!) 162/69  Pulse: 77  Resp: 18  Temp: 97.8 F (36.6 C)  SpO2: 99%     Body mass index is 36.23 kg/m.   Wt Readings from Last 3 Encounters:  08/06/19 185 lb 8 oz (84.1 kg)  07/29/19 185 lb 13.6 oz (84.3 kg)  07/26/19 187 lb 4.8 oz (85 kg)      ECOG FS:1 - Symptomatic but completely ambulatory  Sclerae unicteric, EOMs intact Wearing a mask No cervical or supraclavicular adenopathy Lungs no rales or rhonchi Heart regular rate and rhythm Abd soft, nontender, positive bowel sounds MSK no focal spinal tenderness, no upper extremity lymphedema Neuro: nonfocal, well oriented, appropriate affect Breasts: The right breast is unremarkable.  The Band-Aid over the recent biopsy is still in place.  I can still feel a small mass in the inferior aspect of the left breast, palpating upward, with no skin or nipple changes.  Both axillae are benign.   LAB RESULTS:  CMP     Component Value Date/Time   NA 141 08/06/2019 1447   K 4.0 08/06/2019 1447   CL 105 08/06/2019 1447   CO2 27 08/06/2019 1447   GLUCOSE 94 08/06/2019 1447   BUN 12 08/06/2019 1447   CREATININE 0.77 08/06/2019 1447   CREATININE 0.94 07/17/2019 0823   CALCIUM 9.0 08/06/2019 1447   PROT 6.6 08/06/2019 1447   ALBUMIN 3.6 08/06/2019 1447   AST 10 (L) 08/06/2019 1447   AST 21 07/17/2019 0823   ALT 10 08/06/2019 1447   ALT 18 07/17/2019 0823   ALKPHOS 87 08/06/2019 1447   BILITOT 0.5 08/06/2019 1447   BILITOT 0.4 07/17/2019  0823   GFRNONAA >60 08/06/2019 1447   GFRNONAA >60 07/17/2019 0823   GFRAA >60 08/06/2019 1447   GFRAA >60 07/17/2019 0823    No results found for: TOTALPROTELP, ALBUMINELP, A1GS, A2GS, BETS, BETA2SER, GAMS, MSPIKE, SPEI  Lab Results  Component Value Date   WBC 0.4 (LL) 08/06/2019   NEUTROABS 0.0 (LL) 08/06/2019   HGB 11.3 (L) 08/06/2019   HCT 35.3 (L) 08/06/2019   MCV 96.4 08/06/2019   PLT 82 (L) 08/06/2019    No results found for: LABCA2  No components found for: [id number]  No results for input(s): INR in the last 168 hours.  No results found for: LABCA2  No results found for: IWO032  No results found for: ZYY482  No results found for: NOI370  No results found for: CA2729  No components found for: HGQUANT  No results found for: CEA1 / No results found for: CEA1   No results found for: AFPTUMOR  No results found for: CHROMOGRNA  No results found for: KPAFRELGTCHN, LAMBDASER, KAPLAMBRATIO (kappa/lambda light chains)  No results found for: HGBA, HGBA2QUANT, HGBFQUANT, HGBSQUAN (Hemoglobinopathy evaluation)   No results found for: LDH  No results found for: IRON, TIBC, IRONPCTSAT (Iron and TIBC)  No results found for: FERRITIN  Urinalysis No results found for: COLORURINE, APPEARANCEUR, LABSPEC, PHURINE, GLUCOSEU, HGBUR, BILIRUBINUR, KETONESUR, PROTEINUR, UROBILINOGEN, NITRITE, LEUKOCYTESUR   STUDIES: CT Chest W Contrast  Result Date: 07/31/2019 CLINICAL DATA:  New diagnosis left breast cancer, ongoing chemotherapy. EXAM: CT CHEST WITH CONTRAST TECHNIQUE: Multidetector CT imaging of the chest was performed during intravenous contrast administration.  CONTRAST:  69m OMNIPAQUE IOHEXOL 300 MG/ML  SOLN COMPARISON:  None. FINDINGS: Cardiovascular: Right IJ Port-A-Cath terminates in the high right atrium. Atherosclerotic calcification of the aorta. Pulmonic trunk and heart are enlarged. No pericardial effusion. Mediastinum/Nodes: No pathologically enlarged  mediastinal, hilar, right axillary or internal mammary lymph nodes. A 9 mm lymph node in the left axilla is seen in association with a localization clip (2/52). Esophagus is unremarkable. Lungs/Pleura: Image quality is degraded by respiratory motion. Subpleural nodules in the apical left upper lobe measure up to 3 mm and are likely benign. Scarring in the lingula and left lower lobe. There may be additional millimetric subpleural nodules along the left hemidiaphragm, also likely benign subpleural lymph nodes. No pleural fluid. Airway is unremarkable. Upper Abdomen: Visualized portions of the liver, gallbladder and adrenal glands are unremarkable. Low-attenuation lesions in the kidneys measure up to 1.5 cm on the right and are likely cysts. Visualized portions of the kidneys, spleen, pancreas, stomach and bowel are otherwise unremarkable. Musculoskeletal: Postoperative changes of recent lumpectomy in the subareolar left breast. There are 2 additional areas of nodular soft tissue in the inferomedial left breast, measuring up to 1.7 cm (2/83) with associated localization clips. IMPRESSION: 1. Left axillary lymph node is at the upper limits of normal in size. Postoperative changes in the left breast with additional nodules in the inferomedial left breast. No evidence of intrathoracic metastatic disease. 2. Millimetric subpleural nodules in the left lung are likely benign. Continued attention on follow-up exams is suggested. 3. Aortic atherosclerosis (ICD10-I70.0). 4. Enlarged pulmonic trunk, indicative of pulmonary arterial hypertension. Electronically Signed   By: MLorin PicketM.D.   On: 07/31/2019 13:01   NM Bone Scan Whole Body  Result Date: 07/31/2019 CLINICAL DATA:  LEFT breast cancer, staging EXAM: NUCLEAR MEDICINE WHOLE BODY BONE SCAN TECHNIQUE: Whole body anterior and posterior images were obtained approximately 3 hours after intravenous injection of radiopharmaceutical. RADIOPHARMACEUTICALS:  19.8 mCi  Technetium-947mDP IV COMPARISON:  None Radiographic correlation: CT chest 07/31/2019 FINDINGS: Uptake at shoulders, elbows, hips, knees, feet, typically degenerative. No worrisome sites of tracer uptake are identified to suggest osseous metastatic disease. Expected urinary tract and soft tissue distribution of tracer. IMPRESSION: No scintigraphic evidence of osseous metastatic disease. Electronically Signed   By: MaLavonia Dana.D.   On: 07/31/2019 16:34   MR BREAST BILATERAL W WO CONTRAST  Result Date: 07/25/2019 CLINICAL DATA:  [age]ear old female with recent diagnosis of grade 3 invasive ductal carcinoma involving the inferior left breast and a metastatic left axillary lymph node on ultrasound guided biopsy from 07/09/2019. She had a stereotactic biopsy of a small group of calcifications in the left breast with benign pathology (fibrocystic changes) on 07/10/2019. She presents for MRI to determine the extent of disease. LABS:  Creatinine of 0.94 mg/dL and GFR of greater than 60 on 07/17/2019. EXAM: BILATERAL BREAST MRI WITH AND WITHOUT CONTRAST TECHNIQUE: Multiplanar, multisequence MR images of both breasts were obtained prior to and following the intravenous administration of 8 ml of Gadavist Three-dimensional MR images were rendered by post-processing of the original MR data on an independent workstation. The three-dimensional MR images were interpreted, and findings are reported in the following complete MRI report for this study. Three dimensional images were evaluated at the independent DynaCad workstation COMPARISON:  No prior MRI available for comparison. Correlation made with prior mammograms and ultrasounds. FINDINGS: Breast composition: c. Heterogeneous fibroglandular tissue. Background parenchymal enhancement: Moderate. Right breast: In the central right breast into the lower outer  quadrant, there are a few subcentimeter scattered enhancing masses in a segmental distribution spanning approximately 6  cm in the anterior to posterior dimension, best represented in the MIP images. One of the anterior-most lesions is 4 mm, and is seen in series 10701, image 163 and demonstrates mixed kinetics. One of the posterior most lesions is seen in the inferior central right breast in series 10701, image 156. This lesion measures 7 mm and demonstrates predominantly persistent kinetics. Left breast: The biopsy-proven malignancy in the inferior left breast of (series 10701, image 171) is one contiguous bilobed mass with central necrosis measuring 3.4 x 2.4 x 2.0 cm. Superior, anterior and slightly medial to the mass, there is a rim enhancing 1.4 cm lesion associated with susceptibility artifact, compatible with the biopsy marking clip from the benign stereotactic biopsy. There is patchy discontinuous linear enhancement extending from the lateral skin surface towards the biopsy site, very likely representing the biopsy tract (series 10701, between images 151 through 158). There are two 6 mm masses in the lateral left breast, middle and posterior depth, which are both superior and lateral to the known cancer. These are near, but not definitely part of the suspected biopsy tract. Both of these masses can be seen together in series 10701, image 151. There is an elongated 7 mm enhancing mass with washout kinetics in the lateral posterior left breast which corresponds with a small intramammary lymph node on the mammogram. Lymph nodes: No abnormal appearing lymph nodes in the right axilla. A lymph node in the left axilla with associated susceptibility from the biopsy marking clip is consistent with the biopsy proven metastatic lymph node. No other abnormal left axillary lymph nodes are identified. Ancillary findings:  None. IMPRESSION: 1. The bilobed biopsy-proven cancer in the inferior left breast measures 3.4 cm. 2. The biopsy proven metastatic left axillary lymph node is the only abnormal lymph node in the left axilla. 2.  Discontinuous linear enhancement from the lateral left breast toward the benign biopsy site of calcifications likely represents the biopsy tract. However, there are two 6 mm masses close to the biopsy tract but are not definitely part of it. These are indeterminate. 2. There are scattered masses spanning approximately 6 cm in the central to lower outer right breast in a segmental distribution. 4.  No abnormal right axillary lymph nodes are identified. RECOMMENDATION: 1. MRI guided biopsy is recommended for the two 6 mm masses in the lateral left breast if breast conservation is being considered. 2. MRI guided biopsy is recommended for the anterior and posterior margin of the scattered segmentally distributed masses in the central to lower outer right breast. Though the anterior lesion is only 4 mm (image 163), the kinetics of this lesion is the most suspicious and if possible, this should be the target for the anterior margin. BI-RADS CATEGORY  4: Suspicious. Electronically Signed   By: Ammie Ferrier M.D.   On: 07/25/2019 12:04   DG Chest Port 1 View  Result Date: 07/29/2019 CLINICAL DATA:  Port-A-Cath placement.  Breast cancer EXAM: PORTABLE CHEST 1 VIEW COMPARISON:  None. FINDINGS: Right jugular Port-A-Cath tip at the cavoatrial junction. No pneumothorax Asymmetric density right hilum possibly due to mass or adenopathy. There is dextroscoliosis which could be affecting evaluation of the hilum. Nevertheless, recommend further evaluation with CT chest with contrast. Negative for heart failure infiltrate or effusion. Lungs well aerated IMPRESSION: Satisfactory Port-A-Cath placement Possible right hilar mass/adenopathy. Recommend CT chest with contrast. Electronically Signed   By: Juanda Crumble  Carlis Abbott M.D.   On: 07/29/2019 13:55   DG Fluoro Guide CV Line-No Report  Result Date: 07/29/2019 Fluoroscopy was utilized by the requesting physician.  No radiographic interpretation.   ECHOCARDIOGRAM COMPLETE  Result  Date: 07/25/2019   ECHOCARDIOGRAM REPORT   Patient Name:   REGGIE WELGE Date of Exam: 07/25/2019 Medical Rec #:  [id number]     Height:       60.0 in Accession #:    [id number]    Weight:       185.2 lb Date of Birth:  Apr 24, 1946     BSA:          1.81 m Patient Age:    35 years      BP:           165/79 mmHg Patient Gender: F             HR:           94 bpm. Exam Location:  Outpatient Procedure: 2D Echo, Cardiac Doppler and Color Doppler Indications:    Chemo evaluation  History:        Patient has no prior history of Echocardiogram examinations.                 Breast cancer.  Sonographer:    Dustin Flock Referring Phys: [place]  1. Left ventricular ejection fraction, by visual estimation, is 60 to 65%. The left ventricle has normal function. There is mildly increased left ventricular hypertrophy.  2. Left ventricular diastolic parameters are consistent with Grade I diastolic dysfunction (impaired relaxation).  3. The left ventricle has no regional wall motion abnormalities.  4. Global right ventricle has normal systolic function.The right ventricular size is normal. No increase in right ventricular wall thickness.  5. Left atrial size was normal.  6. Right atrial size was normal.  7. The mitral valve is normal in structure. No evidence of mitral valve regurgitation. No evidence of mitral stenosis.  8. The tricuspid valve is normal in structure.  9. The tricuspid valve is normal in structure. Tricuspid valve regurgitation is not demonstrated. 10. The aortic valve is normal in structure. Aortic valve regurgitation is trivial. No evidence of aortic valve sclerosis or stenosis. 11. The pulmonic valve was normal in structure. Pulmonic valve regurgitation is not visualized. 12. The inferior vena cava is normal in size with greater than 50% respiratory variability, suggesting right atrial pressure of 3 mmHg. 13. The average left ventricular global longitudinal strain is -14.8 %. FINDINGS   Left Ventricle: Left ventricular ejection fraction, by visual estimation, is 60 to 65%. The left ventricle has normal function. The average left ventricular global longitudinal strain is -14.8 %. The left ventricle has no regional wall motion abnormalities. There is mildly increased left ventricular hypertrophy. Left ventricular diastolic parameters are consistent with Grade I diastolic dysfunction (impaired relaxation). Normal left atrial pressure. Right Ventricle: The right ventricular size is normal. No increase in right ventricular wall thickness. Global RV systolic function is has normal systolic function. Left Atrium: Left atrial size was normal in size. Right Atrium: Right atrial size was normal in size Pericardium: There is no evidence of pericardial effusion. Mitral Valve: The mitral valve is normal in structure. No evidence of mitral valve regurgitation. No evidence of mitral valve stenosis by observation. Tricuspid Valve: The tricuspid valve is normal in structure. Tricuspid valve regurgitation is not demonstrated. Aortic Valve: The aortic valve is normal in structure. Aortic valve regurgitation is trivial. Aortic regurgitation  PHT measures 648 msec. The aortic valve is structurally normal, with no evidence of sclerosis or stenosis. Pulmonic Valve: The pulmonic valve was normal in structure. Pulmonic valve regurgitation is not visualized. Pulmonic regurgitation is not visualized. Aorta: The aortic root, ascending aorta and aortic arch are all structurally normal, with no evidence of dilitation or obstruction. Venous: The inferior vena cava is normal in size with greater than 50% respiratory variability, suggesting right atrial pressure of 3 mmHg. IAS/Shunts: No atrial level shunt detected by color flow Doppler. There is no evidence of a patent foramen ovale. No ventricular septal defect is seen or detected. There is no evidence of an atrial septal defect.  LEFT VENTRICLE PLAX 2D LVIDd:         4.00 cm   Diastology LVIDs:         2.50 cm  LV e' lateral:   6.96 cm/s LV PW:         1.30 cm  LV E/e' lateral: 11.4 LV IVS:        1.50 cm  LV e' medial:    7.51 cm/s LVOT diam:     2.10 cm  LV E/e' medial:  10.6 LV SV:         48 ml LV SV Index:   24.72    2D Longitudinal Strain LVOT Area:     3.46 cm 2D Strain GLS Avg:     -14.8 %  RIGHT VENTRICLE RV Basal diam:  2.90 cm RV S prime:     6.64 cm/s TAPSE (M-mode): 2.2 cm LEFT ATRIUM             Index       RIGHT ATRIUM           Index LA diam:        3.60 cm 1.99 cm/m  RA Area:     10.10 cm LA Vol (A2C):   37.1 ml 20.54 ml/m RA Volume:   19.40 ml  10.74 ml/m LA Vol (A4C):   38.0 ml 21.03 ml/m LA Biplane Vol: 40.5 ml 22.42 ml/m  AORTIC VALVE LVOT Vmax:   114.00 cm/s LVOT Vmean:  77.100 cm/s LVOT VTI:    0.247 m AI PHT:      648 msec  AORTA Ao Root diam: 3.00 cm MITRAL VALVE MV Area (PHT): 3.42 cm             SHUNTS MV PHT:        64.38 msec           Systemic VTI:  0.25 m MV Decel Time: 222 msec             Systemic Diam: 2.10 cm MV E velocity: 79.30 cm/s 103 cm/s MV A velocity: 93.00 cm/s 70.3 cm/s MV E/A ratio:  0.85       1.5  Candee Furbish MD Electronically signed by Candee Furbish MD Signature Date/Time: 07/25/2019/5:30:25 PM    Final    [place] AXILLARY NODE CORE BIOPSY LEFT  Addendum Date: 07/10/2019   ADDENDUM REPORT: 07/10/2019 14:14 ADDENDUM: Pathology revealed: 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 This was found to be concordant by Dr. Peggye Fothergill. 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 This was found to be concordant by Dr. Peggye Fothergill. METASTATIC CARCINOMA INVOLVING A LYMPH NODE of the  Left axilla. This was found to be concordant by Dr. Peggye Fothergill. Pathology results were discussed with the patient in person by Dr. Hassan Rowan at the time of her  stereotactic biopsy performed today. The patient reported doing well after the biopsies with tenderness at the sites. Post biopsy instructions and care were reviewed and questions were answered. The patient was encouraged to call [facility] for any additional concerns. The patient was referred to [facility] on July 17, 2019. [facility] for a Left breast stereotatic guided biopsy to evaluate extent of disease. Pathology results reported by Terie Purser, RN on 07/10/2019. Electronically Signed   By: Evangeline Dakin M.D.   On: 07/10/2019 14:14   Result Date: 07/10/2019 CLINICAL DATA:  74 year old with screening detected contiguous adjacent masses involving the LOWER LEFT breast at posterior depth associated with suspicious calcifications. Diagnostic workup also demonstrated a solitary abnormal LEFT axillary lymph node with focal cortical thickening up to 5 mm. She also has an indeterminate 2 mm group of calcifications in the LOWER LEFT breast anterior to the masses for which stereotactic tomosynthesis biopsy has been scheduled for tomorrow. EXAM: ULTRASOUND-GUIDED LEFT BREAST CORE NEEDLE BIOPSY x 2 ULTRASOUND-GUIDED CORE NEEDLE BIOPSY OF A LEFT AXILLARY LYMPH NODE COMPARISON:  Previous exam(s). FINDINGS: I met with the patient and we discussed the procedure of ultrasound-guided biopsy, including benefits and alternatives. We discussed the high likelihood of a successful procedure. We discussed the risks of the procedure, including infection, bleeding, tissue injury, clip migration, and inadequate sampling. Informed written consent was given. The usual time-out protocol was performed immediately prior to the procedure. # 1) Lesion quadrant: LOWER OUTER QUADRANT, 5:30 o'clock position 7 cm from nipple, ANTERIOR mass: Initially, using sterile technique with  chlorhexidine as skin antisepsis, 1% lidocaine and 1% lidocaine with epinephrine as local anesthetic, under direct ultrasound visualization, a 12 gauge Bard Marquee core needle device placed through an 11 gauge introducer needle was used to perform biopsy of the ANTERIOR mass at the 5:30 o'clock position approximately 7 cm from nipple using a lateral approach. At the conclusion of the procedure a ribbon shaped tissue marker clip was deployed into the biopsy cavity. # 2) Lesion quadrant: LOWER OUTER QUADRANT, 5:30 o'clock position 7 cm from nipple, POSTERIOR mass: Using the same dermatotomy site as above, 1% lidocaine and 1% lidocaine with epinephrine as local anesthetic, under direct ultrasound visualization, a second 12 gauge Bard Marquee core needle device placed through an 11 gauge introducer needle was used perform biopsy of the POSTERIOR mass at the 5:30 o'clock position approximately 7 cm from the nipple using a LATERAL approach. At the conclusion of the procedure a coil shaped tissue marker clip was deployed into the biopsy cavity. # 3) LEFT axillary node: Using sterile technique with chlorhexidine as skin antisepsis, 1% lidocaine and 1% lidocaine with epinephrine as local anesthetic, under direct ultrasound visualization, a 14 gauge Bard Marquee core needle device placed through a 13 gauge introducer needle was used perform biopsy of the abnormal LEFT axillary lymph node with focal cortical thickening using an inferolateral approach. At the conclusion of the procedure a Q shaped tissue marker clip was deployed into the biopsy cavity. Follow up 2 view mammogram was performed and dictated separately. IMPRESSION: Ultrasound-guided biopsy of 2 adjacent contiguous masses involving the LOWER OUTER QUADRANT of the LEFT breast and ultrasound-guided biopsy of  a solitary abnormal LEFT axillary lymph node. No apparent complications. Electronically Signed: By: Evangeline Dakin M.D. On: 07/09/2019 14:14   MM CLIP  PLACEMENT LEFT  Result Date: 07/29/2019 CLINICAL DATA:  Patient underwent MRI guided biopsy on 07/26/2019 for 2 areas in the right breast and a single area in the left breast. Patient has biopsy-proven left breast malignancy. EXAM: DIAGNOSTIC BILATERAL MAMMOGRAM POST MRI BIOPSY COMPARISON:  Previous exam(s). FINDINGS: Mammographic images were obtained following MRI guided biopsy of 2 lesions in the right breast and a single additional lesion in the left breast. The left breast biopsy marker clip, which is a dumbbell clip, lies in the medial, slightly inferior aspect of the left breast, anterior to the 2 X shaped biopsy clips and the more posterior ribbon shaped biopsy clip, the latter localizing the known breast carcinoma. On the right, the cylinder shaped biopsy clip lies inferiorly and the dumbbell shaped biopsy clip lies laterally in the expected location of the MRI lesions. IMPRESSION: Appropriate positioning of the bilateral dumbbell shaped shaped and right breast cylinder shaped biopsy marking clips at the site of biopsy in the right breast inferiorly and laterally and the left breast anterior lower inner quadrant. Final Assessment: Post Procedure Mammograms for Marker Placement Electronically Signed   By: Lajean Manes M.D.   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 This was found to be concordant by Dr. Hassan Rowan. Pathology results were discussed with the patient by telephone. The patient reported doing well after the biopsy with tenderness at the site. Post biopsy instructions and care were reviewed and questions were answered. The patient was encouraged to call [facility] for any additional concerns. The patient has a recent diagnosis of LEFT breast cancer and was referred to [facility] at [facility] on July 16, 2018. Pathology results reported by Terie Purser, RN on 07/11/2019. Electronically Signed   By: Margarette Canada M.D.   On: 07/11/2019 14:10   Result Date: 07/11/2019 CLINICAL DATA:  74 year old female for tissue sampling of 0.2 cm group of calcifications within the central LEFT breast. EXAM: LEFT BREAST STEREOTACTIC CORE NEEDLE BIOPSY 3D DIAGNOSTIC LEFT MAMMOGRAM POST STEREOTACTIC BIOPSY COMPARISON:  Previous exams. FINDINGS: The patient and I discussed the procedure of stereotactic-guided biopsy including benefits and alternatives. We discussed the high likelihood of a successful procedure. We discussed the risks of the procedure including infection, bleeding, tissue injury, clip migration, and inadequate sampling. Informed written consent was given. The usual time out protocol was performed immediately prior to the procedure. Using sterile technique and 1% Lidocaine as local anesthetic, under stereotactic guidance, a 9 gauge vacuum assisted device was used to perform core needle biopsy of the 0.2 cm group of calcifications within the central LEFT breast using a LATERAL approach. Specimen radiograph was performed showing calcifications. Specimens with calcifications are identified for pathology. At the conclusion of the procedure, 2 separate X shaped tissue marker clips were deployed into the biopsy cavity. The 2nd X shaped clip was placed as the 1st clip was not visualized on the immediate post clip image during the procedure. Follow-up 2-view mammogram was performed demonstrating 2 X shaped tissue marker clips in satisfactory position at the expected site of biopsy within the LEFT breast. IMPRESSION: Stereotactic-guided biopsy of 0.2 cm group of calcifications within  the central LEFT breast. No apparent complications. Satisfactory position of 2 X shaped tissue marker clips within the LEFT breast following stereotactic guided biopsy.  Electronically Signed: By: Margarette Canada M.D. On: 07/10/2019 12:10   MM CLIP PLACEMENT LEFT  Result Date: 07/09/2019 CLINICAL DATA:  Confirmation clip placement after ultrasound-guided core needle biopsy of 2 adjacent contiguous masses involving the LOWER OUTER QUADRANT of the LEFT breast at POSTERIOR depth and ultrasound-guided core needle biopsy of a solitary abnormal LEFT axillary lymph node. EXAM: 2D AND TOMOSYNTHESIS DIAGNOSTIC LEFT MAMMOGRAM POST ULTRASOUND BIOPSY COMPARISON:  Previous exam(s). FINDINGS: Mammographic images were obtained following ultrasound guided biopsy of 2 adjacent contiguous masses involving the LOWER OUTER LEFT breast and ultrasound-guided biopsy of a solitary abnormal LEFT axillary lymph node. The ribbon shaped tissue marking clip is appropriately positioned within the biopsied ANTERIOR mass in the [place] at POSTERIOR depth. The coil shaped tissue marker clip is appropriately positioned within the biopsied POSTERIOR mass in the [place] at POSTERIOR depth. The ribbon clip and the coil clip are separated by approximately 1.9 cm. The Q shaped tissue marker clip is appropriately positioned within the biopsied abnormal LEFT axillary lymph node. Expected post biopsy changes are present at each site without evidence of hematoma. IMPRESSION: 1. Appropriate positioning of the ribbon shaped tissue marking clip within the biopsied ANTERIOR mass in the [place] at POSTERIOR depth. 2. Appropriate positioning of the coil shaped tissue marker clip within the biopsied POSTERIOR mass in the LOWER OUTER QUADRANT at POSTERIOR depth. 3. Appropriate positioning of the Q shaped tissue marker clip within the biopsied abnormal LEFT axillary lymph node. Final Assessment: Post Procedure Mammograms for Marker Placement Electronically Signed   By: Evangeline Dakin M.D.   On: 07/09/2019 14:18   MM CLIP PLACEMENT RIGHT  Result Date: 07/29/2019 CLINICAL DATA:  Patient  underwent MRI guided biopsy on 07/26/2019 for 2 areas in the right breast and a single area in the left breast. Patient has biopsy-proven left breast malignancy. EXAM: DIAGNOSTIC BILATERAL MAMMOGRAM POST MRI BIOPSY COMPARISON:  Previous exam(s). FINDINGS: Mammographic images were obtained following MRI guided biopsy of 2 lesions in the right breast and a single additional lesion in the left breast. The left breast biopsy marker clip, which is a dumbbell clip, lies in the medial, slightly inferior aspect of the left breast, anterior to the 2 X shaped biopsy clips and the more posterior ribbon shaped biopsy clip, the latter localizing the known breast carcinoma. On the right, the cylinder shaped biopsy clip lies inferiorly and the dumbbell shaped biopsy clip lies laterally in the expected location of the MRI lesions. IMPRESSION: Appropriate positioning of the bilateral dumbbell shaped shaped and right breast cylinder shaped biopsy marking clips at the site of biopsy in the right breast inferiorly and laterally and the left breast anterior lower inner quadrant. Final Assessment: Post Procedure Mammograms for Marker Placement Electronically Signed   By: Lajean Manes M.D.   On: 07/29/2019 09:42   MM LT BREAST BX W LOC DEV 1ST LESION IMAGE BX SPEC STEREO GUIDE  Addendum Date: 07/11/2019   ADDENDUM REPORT: 07/11/2019 14:10 ADDENDUM: Pathology revealed FIBROCYSTIC CHANGES WITH CALCIFICATIONS of the LEFT breast, central. This was found to be concordant by Dr. Hassan Rowan. Pathology results were discussed with the patient by telephone. The patient reported doing well after the biopsy with tenderness at the site. Post biopsy instructions and care were reviewed and questions were answered. The patient was encouraged to call  [facility] for any additional concerns. The patient has a recent diagnosis of LEFT breast cancer and was referred to [facility] on July 16, 2018. Pathology results reported by Terie Purser, RN on 07/11/2019. Electronically Signed   By: Margarette Canada M.D.   On: 07/11/2019 14:10   Result Date: 07/11/2019 CLINICAL DATA:  74 year old female for tissue sampling of 0.2 cm group of calcifications within the central LEFT breast. EXAM: LEFT BREAST STEREOTACTIC CORE NEEDLE BIOPSY 3D DIAGNOSTIC LEFT MAMMOGRAM POST STEREOTACTIC BIOPSY COMPARISON:  Previous exams. FINDINGS: The patient and I discussed the procedure of stereotactic-guided biopsy including benefits and alternatives. We discussed the high likelihood of a successful procedure. We discussed the risks of the procedure including infection, bleeding, tissue injury, clip migration, and inadequate sampling. Informed written consent was given. The usual time out protocol was performed immediately prior to the procedure. Using sterile technique and 1% Lidocaine as local anesthetic, under stereotactic guidance, a 9 gauge vacuum assisted device was used to perform core needle biopsy of the 0.2 cm group of calcifications within the central LEFT breast using a LATERAL approach. Specimen radiograph was performed showing calcifications. Specimens with calcifications are identified for pathology. At the conclusion of the procedure, 2 separate X shaped tissue marker clips were deployed into the biopsy cavity. The 2nd X shaped clip was placed as the 1st clip was not visualized on the immediate post clip image during the procedure. Follow-up 2-view mammogram was performed demonstrating 2 X shaped tissue marker clips in satisfactory position at the expected site of biopsy within the LEFT breast. IMPRESSION: Stereotactic-guided biopsy of 0.2 cm group of calcifications within the central LEFT breast. No apparent complications. Satisfactory position of 2 X shaped tissue marker clips within the LEFT breast following stereotactic guided biopsy. Electronically Signed: By: Margarette Canada  M.D. 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 This was found to be concordant by Dr. Peggye Fothergill. 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 This was found to be concordant by Dr. Peggye Fothergill. METASTATIC CARCINOMA INVOLVING A LYMPH NODE of the Left axilla. This was found to be concordant by Dr. Peggye Fothergill. Pathology results were discussed with the patient in person by Dr. Hassan Rowan at the time of her stereotactic biopsy performed today. The patient reported doing well after the biopsies with tenderness at the sites. Post biopsy instructions and care were reviewed and questions were answered. The patient was encouraged to call [facility] for any additional concerns. The patient was referred to [facility] on July 17, 2019. [facility] for a Left breast stereotatic guided biopsy to evaluate extent of disease. Pathology results reported by Terie Purser, RN on 07/10/2019. Electronically Signed   By: Evangeline Dakin M.D.   On: 07/10/2019 14:14   Result Date: 07/10/2019 CLINICAL DATA:  74 year old with screening detected contiguous adjacent masses involving the LOWER LEFT breast at posterior depth associated with suspicious calcifications. Diagnostic  workup also demonstrated a solitary abnormal LEFT axillary lymph node with focal cortical thickening up to 5 mm. She also has an indeterminate 2 mm group of calcifications in the LOWER LEFT breast anterior to the masses for which  stereotactic tomosynthesis biopsy has been scheduled for tomorrow. EXAM: ULTRASOUND-GUIDED LEFT BREAST CORE NEEDLE BIOPSY x 2 ULTRASOUND-GUIDED CORE NEEDLE BIOPSY OF A LEFT AXILLARY LYMPH NODE COMPARISON:  Previous exam(s). FINDINGS: I met with the patient and we discussed the procedure of ultrasound-guided biopsy, including benefits and alternatives. We discussed the high likelihood of a successful procedure. We discussed the risks of the procedure, including infection, bleeding, tissue injury, clip migration, and inadequate sampling. Informed written consent was given. The usual time-out protocol was performed immediately prior to the procedure. # 1) Lesion quadrant: LOWER OUTER QUADRANT, 5:30 o'clock position 7 cm from nipple, ANTERIOR mass: Initially, using sterile technique with chlorhexidine as skin antisepsis, 1% lidocaine and 1% lidocaine with epinephrine as local anesthetic, under direct ultrasound visualization, a 12 gauge Bard Marquee core needle device placed through an 11 gauge introducer needle was used to perform biopsy of the ANTERIOR mass at the 5:30 o'clock position approximately 7 cm from nipple using a lateral approach. At the conclusion of the procedure a ribbon shaped tissue marker clip was deployed into the biopsy cavity. # 2) Lesion quadrant: LOWER OUTER QUADRANT, 5:30 o'clock position 7 cm from nipple, POSTERIOR mass: Using the same dermatotomy site as above, 1% lidocaine and 1% lidocaine with epinephrine as local anesthetic, under direct ultrasound visualization, a second 12 gauge Bard Marquee core needle device placed through an 11 gauge introducer needle was used perform biopsy of the POSTERIOR mass at the 5:30 o'clock position approximately 7 cm from the nipple using a LATERAL approach. At the conclusion of the procedure a coil shaped tissue marker clip was deployed into the biopsy cavity. # 3) LEFT axillary node: Using sterile technique with chlorhexidine as skin antisepsis, 1%  lidocaine and 1% lidocaine with epinephrine as local anesthetic, under direct ultrasound visualization, a 14 gauge Bard Marquee core needle device placed through a 13 gauge introducer needle was used perform biopsy of the abnormal LEFT axillary lymph node with focal cortical thickening using an inferolateral approach. At the conclusion of the procedure a Q shaped tissue marker clip was deployed into the biopsy cavity. Follow up 2 view mammogram was performed and dictated separately. IMPRESSION: Ultrasound-guided biopsy of 2 adjacent contiguous masses involving the LOWER OUTER QUADRANT of the LEFT breast and ultrasound-guided biopsy of a solitary abnormal LEFT axillary lymph node. No apparent complications. Electronically Signed: By: Evangeline Dakin M.D. 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 This was found to be concordant by Dr. Peggye Fothergill. 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 This was found to be concordant by Dr. Peggye Fothergill. METASTATIC CARCINOMA INVOLVING A LYMPH NODE of the Left axilla. This was found to be concordant by Dr. Peggye Fothergill. Pathology results were discussed with the patient in person by Dr. Hassan Rowan at the time of her stereotactic biopsy performed today. The patient reported doing well after the biopsies with tenderness at the sites.  Post biopsy instructions and care were reviewed and questions were answered. The patient was encouraged to call [facility] for any additional concerns. The patient was referred to [facility] on July 17, 2019. [facility] for a Left breast stereotatic guided biopsy to evaluate extent of disease. Pathology results reported by Terie Purser, RN on 07/10/2019. Electronically Signed   By: Evangeline Dakin M.D.   On: 07/10/2019 14:14   Result Date: 07/10/2019 CLINICAL DATA:  74 year old with screening detected contiguous adjacent masses involving the LOWER LEFT breast at posterior depth associated with suspicious calcifications. Diagnostic workup also demonstrated a solitary abnormal LEFT axillary lymph node with focal cortical thickening up to 5 mm. She also has an indeterminate 2 mm group of calcifications in the LOWER LEFT breast anterior to the masses for which stereotactic tomosynthesis biopsy has been scheduled for tomorrow. EXAM: ULTRASOUND-GUIDED LEFT BREAST CORE NEEDLE BIOPSY x 2 ULTRASOUND-GUIDED CORE NEEDLE BIOPSY OF A LEFT AXILLARY LYMPH NODE COMPARISON:  Previous exam(s). FINDINGS: I met with the patient and we discussed the procedure of ultrasound-guided biopsy, including benefits and alternatives. We discussed the high likelihood of a successful procedure. We discussed the risks of the procedure, including infection, bleeding, tissue injury, clip migration, and inadequate sampling. Informed written consent was given. The usual time-out protocol was performed immediately prior to the procedure. # 1) Lesion quadrant: LOWER OUTER QUADRANT, 5:30 o'clock position 7 cm from nipple, ANTERIOR mass: Initially, using sterile technique with chlorhexidine as skin antisepsis, 1% lidocaine and 1% lidocaine with epinephrine as local anesthetic, under direct ultrasound visualization, a 12 gauge Bard Marquee core needle device placed through an 11 gauge introducer needle was used to perform biopsy of the ANTERIOR mass at the 5:30 o'clock position approximately 7 cm from nipple using a lateral approach. At the conclusion of the procedure a  ribbon shaped tissue marker clip was deployed into the biopsy cavity. # 2) Lesion quadrant: LOWER OUTER QUADRANT, 5:30 o'clock position 7 cm from nipple, POSTERIOR mass: Using the same dermatotomy site as above, 1% lidocaine and 1% lidocaine with epinephrine as local anesthetic, under direct ultrasound visualization, a second 12 gauge Bard Marquee core needle device placed through an 11 gauge introducer needle was used perform biopsy of the POSTERIOR mass at the 5:30 o'clock position approximately 7 cm from the nipple using a LATERAL approach. At the conclusion of the procedure a coil shaped tissue marker clip was deployed into the biopsy cavity. # 3) LEFT axillary node: Using sterile technique with chlorhexidine as skin antisepsis, 1% lidocaine and 1% lidocaine with epinephrine as local anesthetic, under direct ultrasound visualization, a 14 gauge Bard Marquee core needle device placed through a 13 gauge introducer needle was used perform biopsy of the abnormal LEFT axillary lymph node with focal cortical thickening using an inferolateral approach. At the conclusion of the procedure a Q shaped tissue marker clip was deployed into the biopsy cavity. Follow up 2 view mammogram was performed and dictated separately. IMPRESSION: Ultrasound-guided biopsy of 2 adjacent contiguous masses involving the LOWER OUTER QUADRANT of the LEFT breast and ultrasound-guided biopsy of a solitary abnormal LEFT axillary lymph node. No apparent complications. Electronically Signed: By: Evangeline Dakin M.D. On: 07/09/2019 14:14   MR LT BREAST BX W LOC DEV 1ST LESION IMAGE BX SPEC MR GUIDE  Addendum Date: 07/31/2019   ADDENDUM REPORT: 07/30/2019 18:08 ADDENDUM:  Pathology revealed ADENOSIS AND FIBROCYSTIC CHANGES WITH CALCIFICATIONS of the RIGHT breast, 9 o'clock posterior. This was found to be concordant by Dr. Lajean Manes. Pathology revealed ATYPICAL DUCTAL HYPERPLASIA, ADENOSIS AND FIBROCYSTIC CHANGES, CALCIFICATIONS of the RIGHT  breast, anterior, lower outer quadrant. This was found to be concordant by Dr. Lajean Manes. 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 This was found to be concordant by Dr. Lajean Manes. Pathology results were discussed with the patient by telephone. The patient reported doing well after the biopsies with tenderness at the sites. Post biopsy instructions and care were reviewed and questions were answered. The patient was encouraged to call [facility] for any additional concerns. The patient has a recent diagnosis of left breast cancer and should follow her outlined treatment plan and surgical management. Pathology results reported by Stacie Acres RN on 07/30/2019. Electronically Signed   By: Lajean Manes M.D.   On: 07/30/2019 18:08   Result Date: 07/31/2019 CLINICAL DATA:  Patient has known left breast carcinoma. She underwent ultrasound-guided biopsy of 2 left breast masses, which revealed invasive carcinoma. She also underwent stereotactic core needle biopsy of left breast calcifications which were benign. Staging MRI showed additional areas of abnormal enhancement in the right breast and separate from the known carcinoma is in the left breast. One of these areas on the left and 2 areas on the right are being targeted for MRI guided biopsy to assess for extent of disease. EXAM: MRI GUIDED CORE NEEDLE BIOPSY OF THE RIGHT BREAST: 2 LESIONS BIOPSIED. MRI GUIDED CORE NEEDLE BIOPSY OF THE LEFT BREAST: 1 LESION BIOPSIED. TECHNIQUE: Multiplanar, multisequence MR imaging of the right and left breasts was performed both before and after administration of intravenous contrast. CONTRAST:  8 mL of Gadavist intravenous contrast COMPARISON:  Previous exams. FINDINGS: I met with the patient, and we discussed the procedure of MRI guided biopsy, including risks, benefits, and alternatives. Specifically, we discussed the risks of  infection, bleeding, tissue injury, clip migration, and inadequate sampling. Informed, written consent was given. The usual time out protocol was performed immediately prior to the procedure. Biopsy #1: Lesion location, right breast, posterior near 9 o'clock. Using sterile technique, 1% Lidocaine, MRI guidance, and a 9 gauge vacuum assisted device, biopsy was performed of an area mass and non mass enhancement using a lateral approach. At the conclusion of the procedure, a barbell shaped tissue marker clip was deployed into the biopsy cavity. Biopsy #2: Lesion location, right breast, lower, outer quadrant, anterior. Using sterile technique, 1% Lidocaine, MRI guidance, and a 9 gauge vacuum assisted device, biopsy was performed of the small enhancing mass using a lateral approach. At the conclusion of the procedure, a cylinder shaped tissue marker clip was deployed into the biopsy cavity. Biopsy #3: Lesion location, left breast near 8:30 o'clock. Using sterile technique, 1% Lidocaine, MRI guidance, and a 9 gauge vacuum assisted device, biopsy was performed of a small enhancing mass using a lateral approach. At the conclusion of the procedure, a cylinder shaped tissue marker clip was deployed into the biopsy cavity. Follow-up 2-view mammogram was performed and dictated separately. IMPRESSION: MRI guided biopsy of enhancing abnormalities, 2 in the right and 1 on the left breasts. No apparent complications. Electronically Signed: By: Lajean Manes M.D. On: 07/26/2019 19:51   MR RT BREAST BX W LOC DEV 1ST LESION IMAGE BX SPEC MR GUIDE  Addendum Date: 07/31/2019   ADDENDUM REPORT: 07/30/2019 18:08 ADDENDUM:  Pathology revealed ADENOSIS AND FIBROCYSTIC CHANGES WITH CALCIFICATIONS of the RIGHT breast, 9 o'clock posterior. This was found to be concordant by Dr. Lajean Manes. Pathology revealed ATYPICAL DUCTAL HYPERPLASIA, ADENOSIS AND FIBROCYSTIC CHANGES, CALCIFICATIONS of the RIGHT breast, anterior, lower outer quadrant.  This was found to be concordant by Dr. Lajean Manes. 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 This was found to be concordant by Dr. Lajean Manes. Pathology results were discussed with the patient by telephone. The patient reported doing well after the biopsies with tenderness at the sites. Post biopsy instructions and care were reviewed and questions were answered. The patient was encouraged to call [facility] for any additional concerns. The patient has a recent diagnosis of left breast cancer and should follow her outlined treatment plan and surgical management. Pathology results reported by Stacie Acres RN on 07/30/2019. Electronically Signed   By: Lajean Manes M.D.   On: 07/30/2019 18:08   Result Date: 07/31/2019 CLINICAL DATA:  Patient has known left breast carcinoma. She underwent ultrasound-guided biopsy of 2 left breast masses, which revealed invasive carcinoma. She also underwent stereotactic core needle biopsy of left breast calcifications which were benign. Staging MRI showed additional areas of abnormal enhancement in the right breast and separate from the known carcinoma is in the left breast. One of these areas on the left and 2 areas on the right are being targeted for MRI guided biopsy to assess for extent of disease. EXAM: MRI GUIDED CORE NEEDLE BIOPSY OF THE RIGHT BREAST: 2 LESIONS BIOPSIED. MRI GUIDED CORE NEEDLE BIOPSY OF THE LEFT BREAST: 1 LESION BIOPSIED. TECHNIQUE: Multiplanar, multisequence MR imaging of the right and left breasts was performed both before and after administration of intravenous contrast. CONTRAST:  8 mL of Gadavist intravenous contrast COMPARISON:  Previous exams. FINDINGS: I met with the patient, and we discussed the procedure of MRI guided biopsy, including risks, benefits, and alternatives. Specifically, we discussed the risks of infection, bleeding, tissue injury, clip  migration, and inadequate sampling. Informed, written consent was given. The usual time out protocol was performed immediately prior to the procedure. Biopsy #1: Lesion location, right breast, posterior near 9 o'clock. Using sterile technique, 1% Lidocaine, MRI guidance, and a 9 gauge vacuum assisted device, biopsy was performed of an area mass and non mass enhancement using a lateral approach. At the conclusion of the procedure, a barbell shaped tissue marker clip was deployed into the biopsy cavity. Biopsy #2: Lesion location, right breast, lower, outer quadrant, anterior. Using sterile technique, 1% Lidocaine, MRI guidance, and a 9 gauge vacuum assisted device, biopsy was performed of the small enhancing mass using a lateral approach. At the conclusion of the procedure, a cylinder shaped tissue marker clip was deployed into the biopsy cavity. Biopsy #3: Lesion location, left breast near 8:30 o'clock. Using sterile technique, 1% Lidocaine, MRI guidance, and a 9 gauge vacuum assisted device, biopsy was performed of a small enhancing mass using a lateral approach. At the conclusion of the procedure, a cylinder shaped tissue marker clip was deployed into the biopsy cavity. Follow-up 2-view mammogram was performed and dictated separately. IMPRESSION: MRI guided biopsy of enhancing abnormalities, 2 in the right and 1 on the left breasts. No apparent complications. Electronically Signed: By: Lajean Manes M.D. On: 07/26/2019 19:51   MR RT BREAST BX W LOC DEV EA ADD LESION IMAGE BX SPEC MR GUIDE  Addendum Date: 07/31/2019   ADDENDUM REPORT: 07/30/2019 18:08  ADDENDUM: Pathology revealed ADENOSIS AND FIBROCYSTIC CHANGES WITH CALCIFICATIONS of the RIGHT breast, 9 o'clock posterior. This was found to be concordant by Dr. Lajean Manes. Pathology revealed ATYPICAL DUCTAL HYPERPLASIA, ADENOSIS AND FIBROCYSTIC CHANGES, CALCIFICATIONS of the RIGHT breast, anterior, lower outer quadrant. This was found to be concordant by Dr.  Lajean Manes. 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 This was found to be concordant by Dr. Lajean Manes. Pathology results were discussed with the patient by telephone. The patient reported doing well after the biopsies with tenderness at the sites. Post biopsy instructions and care were reviewed and questions were answered. The patient was encouraged to call [facility] for any additional concerns. The patient has a recent diagnosis of left breast cancer and should follow her outlined treatment plan and surgical management. Pathology results reported by Stacie Acres RN on 07/30/2019. Electronically Signed   By: Lajean Manes M.D.   On: 07/30/2019 18:08   Result Date: 07/31/2019 CLINICAL DATA:  Patient has known left breast carcinoma. She underwent ultrasound-guided biopsy of 2 left breast masses, which revealed invasive carcinoma. She also underwent stereotactic core needle biopsy of left breast calcifications which were benign. Staging MRI showed additional areas of abnormal enhancement in the right breast and separate from the known carcinoma is in the left breast. One of these areas on the left and 2 areas on the right are being targeted for MRI guided biopsy to assess for extent of disease. EXAM: MRI GUIDED CORE NEEDLE BIOPSY OF THE RIGHT BREAST: 2 LESIONS BIOPSIED. MRI GUIDED CORE NEEDLE BIOPSY OF THE LEFT BREAST: 1 LESION BIOPSIED. TECHNIQUE: Multiplanar, multisequence MR imaging of the right and left breasts was performed both before and after administration of intravenous contrast. CONTRAST:  8 mL of Gadavist intravenous contrast COMPARISON:  Previous exams. FINDINGS: I met with the patient, and we discussed the procedure of MRI guided biopsy, including risks, benefits, and alternatives. Specifically, we discussed the risks of infection, bleeding, tissue injury, clip migration, and inadequate sampling. Informed,  written consent was given. The usual time out protocol was performed immediately prior to the procedure. Biopsy #1: Lesion location, right breast, posterior near 9 o'clock. Using sterile technique, 1% Lidocaine, MRI guidance, and a 9 gauge vacuum assisted device, biopsy was performed of an area mass and non mass enhancement using a lateral approach. At the conclusion of the procedure, a barbell shaped tissue marker clip was deployed into the biopsy cavity. Biopsy #2: Lesion location, right breast, lower, outer quadrant, anterior. Using sterile technique, 1% Lidocaine, MRI guidance, and a 9 gauge vacuum assisted device, biopsy was performed of the small enhancing mass using a lateral approach. At the conclusion of the procedure, a cylinder shaped tissue marker clip was deployed into the biopsy cavity. Biopsy #3: Lesion location, left breast near 8:30 o'clock. Using sterile technique, 1% Lidocaine, MRI guidance, and a 9 gauge vacuum assisted device, biopsy was performed of a small enhancing mass using a lateral approach. At the conclusion of the procedure, a cylinder shaped tissue marker clip was deployed into the biopsy cavity. Follow-up 2-view mammogram was performed and dictated separately. IMPRESSION: MRI guided biopsy of enhancing abnormalities, 2 in the right and 1 on the left breasts. No apparent complications. Electronically Signed: By: Lajean Manes M.D. On: 07/26/2019 19:51     ELIGIBLE FOR AVAILABLE RESEARCH PROTOCOL: no  ASSESSMENT: 74 y.o. [place] woman status post left breast lower outer quadrant biopsy 07/09/2019 for  a clinical T2 N1, stage IIIB invasive ductal carcinoma, grade 3, triple negative, with an MIB-1 of 60%  (a) chest CT scan and bone scan 07/31/2019 showed no evidence of metastatic disease  (b) MRI biopsy of 2 additional areas in the left breast (central, 07/10/2019, and at 830 o'clock on 07/26/2019) are both benign and concordant  (c) biopsy of 2 suspicious areas in the right  breast on 07/26/2019 (9:00 and lower outer quadrant) were benign and concordant   (1) neoadjuvant chemotherapy to consist of cyclophosphamide and doxorubicin in dose dense fashion x4 to start 07/30/2019 followed by weekly carboplatin and paclitaxel x12  (2) definitive surgery to follow with targeted axillary lymph node dissection  (3) adjuvant radiation to follow  (4) genetics testing 07/23/2019 through the STAT Breast cancer panel offered by [facility] found no deleterious mutations in ATM, BRCA1, BRCA2, CDH1, CHEK2, PALB2, PTEN, STK11 and TP53.  Additional testing through the Common Hereditary Cancers Panel offered by [facility] also found no deleterious mutations in IPC, ATM, AXIN2, BARD1, BMPR1A, BRCA1, BRCA2, BRIP1, CDH1, CDK4, CDKN2A (p14ARF), CDKN2A (p16INK4a), CHEK2, CTNNA1, DICER1, EPCAM (Deletion/duplication testing only), GREM1 (promoter region deletion/duplication testing only), KIT, MEN1, MLH1, MSH2, MSH3, MSH6, MUTYH, NBN, NF1, NHTL1, PALB2, PDGFRA, PMS2, POLD1, POLE, PTEN, RAD50, RAD51C, RAD51D, RNF43, SDHB, SDHC, SDHD, SMAD4, SMARCA4. STK11, TP53, TSC1, TSC2, and VHL.  The following genes were evaluated for sequence changes only: SDHA and HOXB13 c.251G>A variant only.   (a) 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    PLAN: Anea tolerated her first dose of doxorubicin and cyclophosphamide remarkably well.  She is prepared to lose her hair in the next 10 days or so and has multiple head covers provided by her sister-in-law.  The only concerns are her extreme nadir neutropenia and the possible early development of associated nausea.  I am putting her on ciprofloxacin for the next 5 days.  She will repeat this after each doxorubicin/cyclophosphamide treatment starting on day 6.  I am also going to drop her dose by 15% with subsequent cycles.  I have encouraged her to use the lorazepam if she begins to develop associated nausea.  She understands this  will make her a little woozy and she should not drive if she takes it.  I am also adding ondansetron which she may start to take as needed beginning on day 3  She will have her next treatment in 1 week assuming her counts allow it.  I will see her a week later just to make sure the nadir is adequate and no other problems have surfaced  Total encounter time 35 minutes.Julie Cruel, MD   08/06/2019 6:37 PM Medical Oncology and Hematology [facility] [place],  [address] Tel. [phone]    Fax. [phone]   This document serves as a record of services personally performed by Lurline Del, MD. It was created on his behalf by Wilburn Mylar, a trained medical scribe. The creation of this record is based on the scribe's personal observations and the provider's statements to them.   I, Lurline Del MD, have reviewed the above documentation for accuracy and completeness, and I agree with the above.   *Total Encounter Time as defined by the Centers for Medicare and Medicaid Services includes, in addition to the face-to-face time of a patient visit (documented in the note above) non-face-to-face time: obtaining and reviewing outside history, ordering and reviewing medications, tests or procedures,  care coordination (communications with other health care professionals or caregivers) and documentation in the medical record.

## 2019-08-06 ENCOUNTER — Inpatient Hospital Stay: Payer: Medicare Other

## 2019-08-06 ENCOUNTER — Inpatient Hospital Stay: Payer: Medicare Other | Attending: Oncology | Admitting: Oncology

## 2019-08-06 ENCOUNTER — Other Ambulatory Visit: Payer: Self-pay

## 2019-08-06 VITALS — BP 162/69 | HR 77 | Temp 97.8°F | Resp 18 | Ht 60.0 in | Wt 185.5 lb

## 2019-08-06 DIAGNOSIS — J45909 Unspecified asthma, uncomplicated: Secondary | ICD-10-CM | POA: Insufficient documentation

## 2019-08-06 DIAGNOSIS — Z5111 Encounter for antineoplastic chemotherapy: Secondary | ICD-10-CM | POA: Diagnosis not present

## 2019-08-06 DIAGNOSIS — Z808 Family history of malignant neoplasm of other organs or systems: Secondary | ICD-10-CM | POA: Insufficient documentation

## 2019-08-06 DIAGNOSIS — J029 Acute pharyngitis, unspecified: Secondary | ICD-10-CM | POA: Insufficient documentation

## 2019-08-06 DIAGNOSIS — M549 Dorsalgia, unspecified: Secondary | ICD-10-CM | POA: Insufficient documentation

## 2019-08-06 DIAGNOSIS — Z7952 Long term (current) use of systemic steroids: Secondary | ICD-10-CM | POA: Insufficient documentation

## 2019-08-06 DIAGNOSIS — Z800 Family history of malignant neoplasm of digestive organs: Secondary | ICD-10-CM | POA: Insufficient documentation

## 2019-08-06 DIAGNOSIS — Z7982 Long term (current) use of aspirin: Secondary | ICD-10-CM | POA: Insufficient documentation

## 2019-08-06 DIAGNOSIS — E785 Hyperlipidemia, unspecified: Secondary | ICD-10-CM | POA: Insufficient documentation

## 2019-08-06 DIAGNOSIS — Z90710 Acquired absence of both cervix and uterus: Secondary | ICD-10-CM | POA: Insufficient documentation

## 2019-08-06 DIAGNOSIS — Z8051 Family history of malignant neoplasm of kidney: Secondary | ICD-10-CM | POA: Insufficient documentation

## 2019-08-06 DIAGNOSIS — I700 Atherosclerosis of aorta: Secondary | ICD-10-CM | POA: Diagnosis not present

## 2019-08-06 DIAGNOSIS — C773 Secondary and unspecified malignant neoplasm of axilla and upper limb lymph nodes: Secondary | ICD-10-CM | POA: Insufficient documentation

## 2019-08-06 DIAGNOSIS — Z791 Long term (current) use of non-steroidal anti-inflammatories (NSAID): Secondary | ICD-10-CM | POA: Insufficient documentation

## 2019-08-06 DIAGNOSIS — C50512 Malignant neoplasm of lower-outer quadrant of left female breast: Secondary | ICD-10-CM

## 2019-08-06 DIAGNOSIS — Z8349 Family history of other endocrine, nutritional and metabolic diseases: Secondary | ICD-10-CM | POA: Diagnosis not present

## 2019-08-06 DIAGNOSIS — Z5189 Encounter for other specified aftercare: Secondary | ICD-10-CM | POA: Insufficient documentation

## 2019-08-06 DIAGNOSIS — Z79899 Other long term (current) drug therapy: Secondary | ICD-10-CM | POA: Insufficient documentation

## 2019-08-06 DIAGNOSIS — Z803 Family history of malignant neoplasm of breast: Secondary | ICD-10-CM | POA: Insufficient documentation

## 2019-08-06 DIAGNOSIS — Z171 Estrogen receptor negative status [ER-]: Secondary | ICD-10-CM

## 2019-08-06 DIAGNOSIS — Z8049 Family history of malignant neoplasm of other genital organs: Secondary | ICD-10-CM | POA: Insufficient documentation

## 2019-08-06 DIAGNOSIS — E039 Hypothyroidism, unspecified: Secondary | ICD-10-CM | POA: Diagnosis not present

## 2019-08-06 DIAGNOSIS — Z806 Family history of leukemia: Secondary | ICD-10-CM | POA: Insufficient documentation

## 2019-08-06 LAB — CBC WITH DIFFERENTIAL/PLATELET
Abs Immature Granulocytes: 0 10*3/uL (ref 0.00–0.07)
Basophils Absolute: 0 10*3/uL (ref 0.0–0.1)
Basophils Relative: 5 %
Eosinophils Absolute: 0 10*3/uL (ref 0.0–0.5)
Eosinophils Relative: 9 %
HCT: 35.3 % — ABNORMAL LOW (ref 36.0–46.0)
Hemoglobin: 11.3 g/dL — ABNORMAL LOW (ref 12.0–15.0)
Immature Granulocytes: 0 %
Lymphocytes Relative: 73 %
Lymphs Abs: 0.3 10*3/uL — ABNORMAL LOW (ref 0.7–4.0)
MCH: 30.9 pg (ref 26.0–34.0)
MCHC: 32 g/dL (ref 30.0–36.0)
MCV: 96.4 fL (ref 80.0–100.0)
Monocytes Absolute: 0 10*3/uL — ABNORMAL LOW (ref 0.1–1.0)
Monocytes Relative: 8 %
Neutro Abs: 0 10*3/uL — CL (ref 1.7–7.7)
Neutrophils Relative %: 5 %
Platelets: 82 10*3/uL — ABNORMAL LOW (ref 150–400)
RBC: 3.66 MIL/uL — ABNORMAL LOW (ref 3.87–5.11)
RDW: 12.6 % (ref 11.5–15.5)
WBC: 0.4 10*3/uL — CL (ref 4.0–10.5)
nRBC: 0 % (ref 0.0–0.2)

## 2019-08-06 LAB — COMPREHENSIVE METABOLIC PANEL
ALT: 10 U/L (ref 0–44)
AST: 10 U/L — ABNORMAL LOW (ref 15–41)
Albumin: 3.6 g/dL (ref 3.5–5.0)
Alkaline Phosphatase: 87 U/L (ref 38–126)
Anion gap: 9 (ref 5–15)
BUN: 12 mg/dL (ref 8–23)
CO2: 27 mmol/L (ref 22–32)
Calcium: 9 mg/dL (ref 8.9–10.3)
Chloride: 105 mmol/L (ref 98–111)
Creatinine, Ser: 0.77 mg/dL (ref 0.44–1.00)
GFR calc Af Amer: >60 mL/min (ref >60)
GFR calc non Af Amer: >60 mL/min (ref >60)
Glucose, Bld: 94 mg/dL (ref 70–99)
Potassium: 4 mmol/L (ref 3.5–5.1)
Sodium: 141 mmol/L (ref 135–145)
Total Bilirubin: 0.5 mg/dL (ref 0.3–1.2)
Total Protein: 6.6 g/dL (ref 6.5–8.1)

## 2019-08-06 MED ORDER — LORAZEPAM 0.5 MG PO TABS: 0.5000 mg | ORAL_TABLET | Freq: Every evening | ORAL | 0 refills | Status: DC | PRN

## 2019-08-06 MED ORDER — CIPROFLOXACIN HCL 500 MG PO TABS: 500.0000 mg | ORAL_TABLET | Freq: Two times a day (BID) | ORAL | 1 refills | Status: DC

## 2019-08-06 MED ORDER — ONDANSETRON HCL 8 MG PO TABS: 8.0000 mg | ORAL_TABLET | Freq: Three times a day (TID) | ORAL | 4 refills | Status: DC | PRN

## 2019-08-07 ENCOUNTER — Encounter: Payer: Self-pay | Admitting: *Deleted

## 2019-08-07 ENCOUNTER — Telehealth: Payer: Self-pay | Admitting: Oncology

## 2019-08-07 NOTE — Telephone Encounter (Signed)
I talk with patient regarding schedule  

## 2019-08-13 ENCOUNTER — Inpatient Hospital Stay: Payer: Medicare Other

## 2019-08-13 ENCOUNTER — Other Ambulatory Visit: Payer: Self-pay

## 2019-08-13 VITALS — BP 151/70 | HR 74 | Temp 98.2°F | Resp 18

## 2019-08-13 DIAGNOSIS — E039 Hypothyroidism, unspecified: Secondary | ICD-10-CM | POA: Diagnosis not present

## 2019-08-13 DIAGNOSIS — Z90710 Acquired absence of both cervix and uterus: Secondary | ICD-10-CM | POA: Diagnosis not present

## 2019-08-13 DIAGNOSIS — Z171 Estrogen receptor negative status [ER-]: Secondary | ICD-10-CM | POA: Diagnosis not present

## 2019-08-13 DIAGNOSIS — Z803 Family history of malignant neoplasm of breast: Secondary | ICD-10-CM | POA: Diagnosis not present

## 2019-08-13 DIAGNOSIS — C50512 Malignant neoplasm of lower-outer quadrant of left female breast: Secondary | ICD-10-CM

## 2019-08-13 DIAGNOSIS — Z5189 Encounter for other specified aftercare: Secondary | ICD-10-CM | POA: Diagnosis not present

## 2019-08-13 DIAGNOSIS — Z7952 Long term (current) use of systemic steroids: Secondary | ICD-10-CM | POA: Diagnosis not present

## 2019-08-13 DIAGNOSIS — Z7982 Long term (current) use of aspirin: Secondary | ICD-10-CM | POA: Diagnosis not present

## 2019-08-13 DIAGNOSIS — E785 Hyperlipidemia, unspecified: Secondary | ICD-10-CM | POA: Diagnosis not present

## 2019-08-13 DIAGNOSIS — J45909 Unspecified asthma, uncomplicated: Secondary | ICD-10-CM | POA: Diagnosis not present

## 2019-08-13 DIAGNOSIS — J029 Acute pharyngitis, unspecified: Secondary | ICD-10-CM | POA: Diagnosis not present

## 2019-08-13 DIAGNOSIS — M549 Dorsalgia, unspecified: Secondary | ICD-10-CM | POA: Diagnosis not present

## 2019-08-13 DIAGNOSIS — Z791 Long term (current) use of non-steroidal anti-inflammatories (NSAID): Secondary | ICD-10-CM | POA: Diagnosis not present

## 2019-08-13 DIAGNOSIS — Z5111 Encounter for antineoplastic chemotherapy: Secondary | ICD-10-CM | POA: Diagnosis not present

## 2019-08-13 DIAGNOSIS — C773 Secondary and unspecified malignant neoplasm of axilla and upper limb lymph nodes: Secondary | ICD-10-CM | POA: Diagnosis not present

## 2019-08-13 DIAGNOSIS — Z79899 Other long term (current) drug therapy: Secondary | ICD-10-CM | POA: Diagnosis not present

## 2019-08-13 DIAGNOSIS — Z95828 Presence of other vascular implants and grafts: Secondary | ICD-10-CM

## 2019-08-13 LAB — COMPREHENSIVE METABOLIC PANEL
ALT: 14 U/L (ref 0–44)
AST: 16 U/L (ref 15–41)
Albumin: 3.4 g/dL — ABNORMAL LOW (ref 3.5–5.0)
Alkaline Phosphatase: 83 U/L (ref 38–126)
Anion gap: 6 (ref 5–15)
BUN: 8 mg/dL (ref 8–23)
CO2: 27 mmol/L (ref 22–32)
Calcium: 8.7 mg/dL — ABNORMAL LOW (ref 8.9–10.3)
Chloride: 110 mmol/L (ref 98–111)
Creatinine, Ser: 0.79 mg/dL (ref 0.44–1.00)
GFR calc Af Amer: >60 mL/min (ref >60)
GFR calc non Af Amer: >60 mL/min (ref >60)
Glucose, Bld: 113 mg/dL — ABNORMAL HIGH (ref 70–99)
Potassium: 4 mmol/L (ref 3.5–5.1)
Sodium: 143 mmol/L (ref 135–145)
Total Bilirubin: <0.2 mg/dL — ABNORMAL LOW (ref 0.3–1.2)
Total Protein: 6.3 g/dL — ABNORMAL LOW (ref 6.5–8.1)

## 2019-08-13 LAB — CBC WITH DIFFERENTIAL/PLATELET
Abs Immature Granulocytes: 0.47 10*3/uL — ABNORMAL HIGH (ref 0.00–0.07)
Basophils Absolute: 0.1 10*3/uL (ref 0.0–0.1)
Basophils Relative: 1 %
Eosinophils Absolute: 0 10*3/uL (ref 0.0–0.5)
Eosinophils Relative: 0 %
HCT: 33.7 % — ABNORMAL LOW (ref 36.0–46.0)
Hemoglobin: 10.9 g/dL — ABNORMAL LOW (ref 12.0–15.0)
Immature Granulocytes: 9 %
Lymphocytes Relative: 9 %
Lymphs Abs: 0.5 10*3/uL — ABNORMAL LOW (ref 0.7–4.0)
MCH: 30.7 pg (ref 26.0–34.0)
MCHC: 32.3 g/dL (ref 30.0–36.0)
MCV: 94.9 fL (ref 80.0–100.0)
Monocytes Absolute: 0.6 10*3/uL (ref 0.1–1.0)
Monocytes Relative: 12 %
Neutro Abs: 3.5 10*3/uL (ref 1.7–7.7)
Neutrophils Relative %: 69 %
Platelets: 170 10*3/uL (ref 150–400)
RBC: 3.55 MIL/uL — ABNORMAL LOW (ref 3.87–5.11)
RDW: 12.6 % (ref 11.5–15.5)
WBC: 5.1 10*3/uL (ref 4.0–10.5)
nRBC: 1.4 % — ABNORMAL HIGH (ref 0.0–0.2)

## 2019-08-13 MED ORDER — DEXAMETHASONE SODIUM PHOSPHATE 10 MG/ML IJ SOLN
INTRAMUSCULAR | Status: AC
  Filled 2019-08-13: qty 1

## 2019-08-13 MED ORDER — PALONOSETRON HCL INJECTION 0.25 MG/5ML
INTRAVENOUS | Status: AC
  Filled 2019-08-13: qty 5

## 2019-08-13 MED ORDER — SODIUM CHLORIDE 0.9 % IV SOLN
150.0000 mg | Freq: Once | INTRAVENOUS | Status: AC
  Administered 2019-08-13: 150 mg via INTRAVENOUS
  Filled 2019-08-13: qty 5

## 2019-08-13 MED ORDER — DEXAMETHASONE SODIUM PHOSPHATE 10 MG/ML IJ SOLN
10.0000 mg | Freq: Once | INTRAMUSCULAR | Status: AC
  Administered 2019-08-13: 10 mg via INTRAVENOUS

## 2019-08-13 MED ORDER — HEPARIN SOD (PORK) LOCK FLUSH 100 UNIT/ML IV SOLN
500.0000 [IU] | Freq: Once | INTRAVENOUS | Status: AC | PRN
  Administered 2019-08-13: 500 [IU]
  Filled 2019-08-13: qty 5

## 2019-08-13 MED ORDER — SODIUM CHLORIDE 0.9% FLUSH
10.0000 mL | INTRAVENOUS | Status: DC | PRN
  Administered 2019-08-13: 10 mL via INTRAVENOUS
  Filled 2019-08-13: qty 10

## 2019-08-13 MED ORDER — DOXORUBICIN HCL CHEMO IV INJECTION 2 MG/ML
51.0000 mg/m2 | Freq: Once | INTRAVENOUS | Status: AC
  Administered 2019-08-13: 96 mg via INTRAVENOUS
  Filled 2019-08-13: qty 48

## 2019-08-13 MED ORDER — HEPARIN SOD (PORK) LOCK FLUSH 100 UNIT/ML IV SOLN
500.0000 [IU] | Freq: Once | INTRAVENOUS | Status: DC
  Filled 2019-08-13: qty 5

## 2019-08-13 MED ORDER — SODIUM CHLORIDE 0.9% FLUSH
10.0000 mL | INTRAVENOUS | Status: DC | PRN
  Administered 2019-08-13: 10 mL
  Filled 2019-08-13: qty 10

## 2019-08-13 MED ORDER — SODIUM CHLORIDE 0.9 % IV SOLN
510.0000 mg/m2 | Freq: Once | INTRAVENOUS | Status: AC
  Administered 2019-08-13: 960 mg via INTRAVENOUS
  Filled 2019-08-13: qty 48

## 2019-08-13 MED ORDER — SODIUM CHLORIDE 0.9 % IV SOLN
Freq: Once | INTRAVENOUS | Status: AC
  Filled 2019-08-13: qty 250

## 2019-08-13 MED ORDER — PALONOSETRON HCL INJECTION 0.25 MG/5ML
0.2500 mg | Freq: Once | INTRAVENOUS | Status: AC
  Administered 2019-08-13: 0.25 mg via INTRAVENOUS

## 2019-08-13 NOTE — Patient Instructions (Signed)
[facility] Discharge Instructions for Patients Receiving Chemotherapy  Today you received the following chemotherapy agents: doxorubicin and cyclophosphamide.  To help prevent nausea and vomiting after your treatment, we encourage you to take your nausea medication as directed.   If you develop nausea and vomiting that is not controlled by your nausea medication, call the clinic.   BELOW ARE SYMPTOMS THAT SHOULD BE REPORTED IMMEDIATELY:  *FEVER GREATER THAN 100.5 F  *CHILLS WITH OR WITHOUT FEVER  NAUSEA AND VOMITING THAT IS NOT CONTROLLED WITH YOUR NAUSEA MEDICATION  *UNUSUAL SHORTNESS OF BREATH  *UNUSUAL BRUISING OR BLEEDING  TENDERNESS IN MOUTH AND THROAT WITH OR WITHOUT PRESENCE OF ULCERS  *URINARY PROBLEMS  *BOWEL PROBLEMS  UNUSUAL RASH Items with * indicate a potential emergency and should be followed up as soon as possible.  Feel free to call the clinic should you have any questions or concerns. The clinic phone number is [phone].  Please show the CHEMO ALERT CARD at check-in to the Emergency Department and triage nurse.   

## 2019-08-15 ENCOUNTER — Other Ambulatory Visit: Payer: Self-pay

## 2019-08-15 ENCOUNTER — Inpatient Hospital Stay: Payer: Medicare Other

## 2019-08-15 VITALS — BP 148/72 | HR 72 | Temp 98.2°F | Resp 18

## 2019-08-15 DIAGNOSIS — J029 Acute pharyngitis, unspecified: Secondary | ICD-10-CM | POA: Diagnosis not present

## 2019-08-15 DIAGNOSIS — Z171 Estrogen receptor negative status [ER-]: Secondary | ICD-10-CM

## 2019-08-15 DIAGNOSIS — J45909 Unspecified asthma, uncomplicated: Secondary | ICD-10-CM | POA: Diagnosis not present

## 2019-08-15 DIAGNOSIS — Z90710 Acquired absence of both cervix and uterus: Secondary | ICD-10-CM | POA: Diagnosis not present

## 2019-08-15 DIAGNOSIS — C50512 Malignant neoplasm of lower-outer quadrant of left female breast: Secondary | ICD-10-CM | POA: Diagnosis not present

## 2019-08-15 DIAGNOSIS — Z5189 Encounter for other specified aftercare: Secondary | ICD-10-CM | POA: Diagnosis not present

## 2019-08-15 DIAGNOSIS — C773 Secondary and unspecified malignant neoplasm of axilla and upper limb lymph nodes: Secondary | ICD-10-CM | POA: Diagnosis not present

## 2019-08-15 DIAGNOSIS — E785 Hyperlipidemia, unspecified: Secondary | ICD-10-CM | POA: Diagnosis not present

## 2019-08-15 DIAGNOSIS — Z79899 Other long term (current) drug therapy: Secondary | ICD-10-CM | POA: Diagnosis not present

## 2019-08-15 DIAGNOSIS — Z803 Family history of malignant neoplasm of breast: Secondary | ICD-10-CM | POA: Diagnosis not present

## 2019-08-15 DIAGNOSIS — Z5111 Encounter for antineoplastic chemotherapy: Secondary | ICD-10-CM | POA: Diagnosis not present

## 2019-08-15 DIAGNOSIS — Z791 Long term (current) use of non-steroidal anti-inflammatories (NSAID): Secondary | ICD-10-CM | POA: Diagnosis not present

## 2019-08-15 DIAGNOSIS — E039 Hypothyroidism, unspecified: Secondary | ICD-10-CM | POA: Diagnosis not present

## 2019-08-15 DIAGNOSIS — M549 Dorsalgia, unspecified: Secondary | ICD-10-CM | POA: Diagnosis not present

## 2019-08-15 DIAGNOSIS — Z7982 Long term (current) use of aspirin: Secondary | ICD-10-CM | POA: Diagnosis not present

## 2019-08-15 DIAGNOSIS — Z7952 Long term (current) use of systemic steroids: Secondary | ICD-10-CM | POA: Diagnosis not present

## 2019-08-15 MED ORDER — PEGFILGRASTIM-JMDB 6 MG/0.6ML ~~LOC~~ SOSY
6.0000 mg | PREFILLED_SYRINGE | Freq: Once | SUBCUTANEOUS | Status: AC
  Administered 2019-08-15: 6 mg via SUBCUTANEOUS

## 2019-08-15 MED ORDER — PEGFILGRASTIM-JMDB 6 MG/0.6ML ~~LOC~~ SOSY
PREFILLED_SYRINGE | SUBCUTANEOUS | Status: AC
  Filled 2019-08-15: qty 0.6

## 2019-08-15 NOTE — Patient Instructions (Signed)

## 2019-08-20 ENCOUNTER — Inpatient Hospital Stay (HOSPITAL_BASED_OUTPATIENT_CLINIC_OR_DEPARTMENT_OTHER): Payer: Medicare Other | Admitting: Adult Health

## 2019-08-20 ENCOUNTER — Other Ambulatory Visit: Payer: Self-pay

## 2019-08-20 ENCOUNTER — Telehealth: Payer: Self-pay | Admitting: Adult Health

## 2019-08-20 ENCOUNTER — Inpatient Hospital Stay: Payer: Medicare Other

## 2019-08-20 ENCOUNTER — Encounter: Payer: Self-pay | Admitting: Adult Health

## 2019-08-20 VITALS — BP 141/69 | HR 84 | Temp 98.5°F | Resp 18 | Ht 60.0 in | Wt 182.8 lb

## 2019-08-20 DIAGNOSIS — M549 Dorsalgia, unspecified: Secondary | ICD-10-CM | POA: Diagnosis not present

## 2019-08-20 DIAGNOSIS — Z79899 Other long term (current) drug therapy: Secondary | ICD-10-CM | POA: Diagnosis not present

## 2019-08-20 DIAGNOSIS — J029 Acute pharyngitis, unspecified: Secondary | ICD-10-CM | POA: Diagnosis not present

## 2019-08-20 DIAGNOSIS — C50512 Malignant neoplasm of lower-outer quadrant of left female breast: Secondary | ICD-10-CM

## 2019-08-20 DIAGNOSIS — E785 Hyperlipidemia, unspecified: Secondary | ICD-10-CM | POA: Diagnosis not present

## 2019-08-20 DIAGNOSIS — Z5111 Encounter for antineoplastic chemotherapy: Secondary | ICD-10-CM | POA: Diagnosis not present

## 2019-08-20 DIAGNOSIS — Z171 Estrogen receptor negative status [ER-]: Secondary | ICD-10-CM | POA: Diagnosis not present

## 2019-08-20 DIAGNOSIS — E039 Hypothyroidism, unspecified: Secondary | ICD-10-CM | POA: Diagnosis not present

## 2019-08-20 DIAGNOSIS — Z791 Long term (current) use of non-steroidal anti-inflammatories (NSAID): Secondary | ICD-10-CM | POA: Diagnosis not present

## 2019-08-20 DIAGNOSIS — J45909 Unspecified asthma, uncomplicated: Secondary | ICD-10-CM | POA: Diagnosis not present

## 2019-08-20 DIAGNOSIS — Z7952 Long term (current) use of systemic steroids: Secondary | ICD-10-CM | POA: Diagnosis not present

## 2019-08-20 DIAGNOSIS — Z803 Family history of malignant neoplasm of breast: Secondary | ICD-10-CM | POA: Diagnosis not present

## 2019-08-20 DIAGNOSIS — Z7982 Long term (current) use of aspirin: Secondary | ICD-10-CM | POA: Diagnosis not present

## 2019-08-20 DIAGNOSIS — Z90710 Acquired absence of both cervix and uterus: Secondary | ICD-10-CM | POA: Diagnosis not present

## 2019-08-20 DIAGNOSIS — C773 Secondary and unspecified malignant neoplasm of axilla and upper limb lymph nodes: Secondary | ICD-10-CM | POA: Diagnosis not present

## 2019-08-20 DIAGNOSIS — Z5189 Encounter for other specified aftercare: Secondary | ICD-10-CM | POA: Diagnosis not present

## 2019-08-20 LAB — COMPREHENSIVE METABOLIC PANEL
ALT: 11 U/L (ref 0–44)
AST: 11 U/L — ABNORMAL LOW (ref 15–41)
Albumin: 3.3 g/dL — ABNORMAL LOW (ref 3.5–5.0)
Alkaline Phosphatase: 82 U/L (ref 38–126)
Anion gap: 7 (ref 5–15)
BUN: 15 mg/dL (ref 8–23)
CO2: 27 mmol/L (ref 22–32)
Calcium: 8.5 mg/dL — ABNORMAL LOW (ref 8.9–10.3)
Chloride: 107 mmol/L (ref 98–111)
Creatinine, Ser: 0.73 mg/dL (ref 0.44–1.00)
GFR calc Af Amer: >60 mL/min (ref >60)
GFR calc non Af Amer: >60 mL/min (ref >60)
Glucose, Bld: 97 mg/dL (ref 70–99)
Potassium: 4.6 mmol/L (ref 3.5–5.1)
Sodium: 141 mmol/L (ref 135–145)
Total Bilirubin: 0.4 mg/dL (ref 0.3–1.2)
Total Protein: 6 g/dL — ABNORMAL LOW (ref 6.5–8.1)

## 2019-08-20 LAB — CBC WITH DIFFERENTIAL/PLATELET
Abs Immature Granulocytes: 0.02 10*3/uL (ref 0.00–0.07)
Basophils Absolute: 0 10*3/uL (ref 0.0–0.1)
Basophils Relative: 5 %
Eosinophils Absolute: 0 10*3/uL (ref 0.0–0.5)
Eosinophils Relative: 0 %
HCT: 32 % — ABNORMAL LOW (ref 36.0–46.0)
Hemoglobin: 10.2 g/dL — ABNORMAL LOW (ref 12.0–15.0)
Immature Granulocytes: 3 %
Lymphocytes Relative: 22 %
Lymphs Abs: 0.1 10*3/uL — ABNORMAL LOW (ref 0.7–4.0)
MCH: 30.7 pg (ref 26.0–34.0)
MCHC: 31.9 g/dL (ref 30.0–36.0)
MCV: 96.4 fL (ref 80.0–100.0)
Monocytes Absolute: 0.1 10*3/uL (ref 0.1–1.0)
Monocytes Relative: 12 %
Neutro Abs: 0.4 10*3/uL — CL (ref 1.7–7.7)
Neutrophils Relative %: 58 %
Platelets: 153 10*3/uL (ref 150–400)
RBC: 3.32 MIL/uL — ABNORMAL LOW (ref 3.87–5.11)
RDW: 12.5 % (ref 11.5–15.5)
WBC: 0.7 10*3/uL — CL (ref 4.0–10.5)
nRBC: 0 % (ref 0.0–0.2)

## 2019-08-20 MED ORDER — FLUCONAZOLE 200 MG PO TABS: 200.0000 mg | ORAL_TABLET | Freq: Every day | ORAL | 0 refills | Status: DC

## 2019-08-20 NOTE — Progress Notes (Signed)
[place]  Telephone:(336) [phone] Fax:(336) [phone]     ID: [name] DOB: 03/04/1946  MR#: [id number]  JKD#:[id number]  Patient Care Team: Leeroy Cha, MD as PCP - General (Internal Medicine) Rockwell Germany, RN as Oncology Nurse Navigator Mauro Kaufmann, RN as Oncology Nurse Navigator Eppie Gibson, MD as Attending Physician (Radiation Oncology) Donnie Mesa, MD as Consulting Physician (General Surgery) Magrinat, Virgie Dad, MD as Consulting Physician (Oncology) Scot Dock, NP OTHER MD:  CHIEF COMPLAINT: triple negative breast cancer  CURRENT TREATMENT: Neoadjuvant chemotherapy   INTERVAL HISTORY: Affie returns today for follow up of her triple negative breast cancer unaccompanied.  She is doing well today.  She is receiving neoadjuvant chemotherapy first with doxorubicin and cyclophosphamide given on day 1 of a 14 day cycle with Neulasta support on day 3.  She is currently cycle 2 day 8.    Kayleeann is tolerating treatment moderately well.  Her main issue has been back pain and a sore throat this week.  The back pain started on injection pain and is improved, the sore throat is mild, constant, started on injection day, however still present.      REVIEW OF SYSTEMS: Saraann denies any fever, chills, chest pain, palpitations, cough, shortness of breath, bowel/bladder changes, dysphagia, ulcerations, nausea, vomiting, headaches vision issues or any other concerns.  A detailed ROS was otherwise non contributory.     HISTORY OF CURRENT ILLNESS: From the original intake note:  Mariella Blackwelder had routine screening mammography on 06/21/2019 showing a possible abnormality in the left breast. She underwent left diagnostic mammography with tomography and left breast ultrasonography at [facility] on 07/01/2019 showing: breast density category C; either 2 adjacent masses or 1 complex dumbbell-shaped mass containing calcifications in the left  breast at 6 o'clock, the largest of which measures 2.1 cm; chest wall invasion not excluded; 1-2 mm group of calcifications just anterior to the mass(es); single abnormal lymph node in the left axilla; vascular calcifications in anterior left breast.  Accordingly on 07/09/2019 she proceeded to biopsy of the left breast areas in question. The pathology from this procedure ([id number]) showed:  1. Left Breast, 5:30, posterior  - invasive ductal carcinoma with necrosis, grade 3  - Prognostic indicators significant for: estrogen receptor, 0% negative and progesterone receptor, 0% negative. Proliferation marker Ki67 at 60%. HER2 negative by immunohistochemistry (0). 2. Left Breast, 5:30, posterior  - invasive ductal carcinoma with necrosis, grade 3  - ductal carcinoma in situ, intermediate grade 3. Left Axilla, lymph node (1)  - metastatic carcinoma involving a lymph node  And additional biopsy of the anterior vascular calcifications in the left breast was performed on 07/10/2019. Pathology [id number]) showed: fibrocystic changes with calcifications; no malignancy identified.  This was felt to be concordant.  The patient's subsequent history is as detailed below.   PAST MEDICAL HISTORY: Past Medical History:  Diagnosis Date   Asthma    no problems now   Family history of basal cell carcinoma    Family history of breast cancer    Family history of colon cancer    Family history of kidney cancer    Family history of leukemia    Family history of peritoneal cancer    GERD (gastroesophageal reflux disease)    H/O hiatal hernia    Hyperlipidemia    Hypothyroidism    IBS (irritable bowel syndrome)    lt breast ca dx'd 07/10/19    PAST SURGICAL HISTORY: Past Surgical History:  Procedure Laterality Date   ABDOMINAL HYSTERECTOMY     age 66   BREAST SURGERY     DILATION AND CURETTAGE OF UTERUS     2 miscarriages age 19 & 77   FLEXIBLE SIGMOIDOSCOPY N/A 10/22/2013   Procedure:  FLEXIBLE SIGMOIDOSCOPY;  Surgeon: Garlan Fair, MD;  Location: [facility];  Service: Endoscopy;  Laterality: N/A;   KNEE ARTHROSCOPY Left    PORTACATH PLACEMENT Right 07/29/2019   Procedure: INSERTION PORT-A-CATH WITH ULTRASOUND;  Surgeon: Donnie Mesa, MD;  Location: [facility];  Service: General;  Laterality: Right;   TONSILLECTOMY     age 89    FAMILY HISTORY: Family History  Problem Relation Age of Onset   Breast cancer Maternal Aunt        dx. in her 55s   Cancer Mother 17       peritoneal cancer, death certificate lists ovarian cancer   Colon cancer Paternal Uncle        dx. late 20s or early 91s   Basal cell carcinoma Sister 40       a second diagnosed in her 54s   Kidney cancer Maternal Grandmother 71   Diabetes Paternal Grandmother    Heart Problems Paternal Grandmother    Leukemia Paternal Uncle        dx. early 66s   Patient's father is currently living at age 17 as of 08/07/19. Patient's mother died from ovarian cancer at age 56. She was diagnosed at age 58. The patient reports breast cancer in a maternal aunt, diagnosed in her 22's. The patient also noted colon cancer in a paternal uncle and kidney cancer in her maternal grandmother.  She has 1 sister.   GYNECOLOGIC HISTORY:  No LMP recorded. Patient has had a hysterectomy. Menarche: [age] years old Age at first live birth: 74 years old [place] P 2 LMP age 88 (with hysterectomy) Contraceptive: never used HRT used for 10-12 years  Hysterectomy? Yes, age 81 BSO? 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  Husband [name] is a retired Psychologist, prison and probation services. She lives at home with husband Butch. She has two children from a prior marriage. Son [name], age 65, works as a Games developer in [place], [place]. Son [name], age 29, works as a Programme researcher, broadcasting/film/video in [place], [place]. The patient has 4 grandchildren and 4 great-grandchildren.  She attends [employer].    ADVANCED DIRECTIVES: In the absence of any documentation to the contrary, the patient's spouse is their HCPOA.    HEALTH MAINTENANCE: Social History   Tobacco Use   Smoking status: Never Smoker   Smokeless tobacco: Never Used  Substance Use Topics   Alcohol use: No   Drug use: No     Colonoscopy: 09/2013, with CT virtual 11/2013  PAP: none on file, s/p hysterectomy  Bone density: 04/2001, -0.5   Allergies  Allergen Reactions   Sulfa Antibiotics Rash   Penicillins Hives    Current Outpatient Medications  Medication Sig Dispense Refill   aspirin EC 81 MG tablet Take 81 mg by mouth daily.     atorvastatin (LIPITOR) 20 MG tablet Take 20 mg by mouth daily.     Cholecalciferol (VITAMIN D) 2000 UNITS tablet Take 2,000 Units by mouth daily.     ciprofloxacin (CIPRO) 500 MG tablet Take 1 tablet (500 mg total) by mouth 2 (two) times daily. For 5 days starting 6 days after chemotherapy 40  tablet 1   co-enzyme Q-10 30 MG capsule Take 20 mg by mouth 3 (three) times daily.     dexamethasone (DECADRON) 4 MG tablet Take 2 tablets by mouth daily starting the day after Carboplatin and Cytoxan x 3 days. Take with food. 30 tablet 1   esomeprazole (NEXIUM) 20 MG capsule Take 20 mg by mouth daily at 12 noon.     famotidine (PEPCID) 20 MG tablet Take 40 mg by mouth 2 (two) times daily.     FIBER PO Take 1 tablet by mouth daily.     HYDROcodone-acetaminophen (NORCO/VICODIN) 5-325 MG tablet Take 1 tablet by mouth every 6 (six) hours as needed for moderate pain. 15 tablet 0   levothyroxine (SYNTHROID) 50 MCG tablet Take 50 mcg by mouth daily.     lidocaine-prilocaine (EMLA) cream Apply 1 application topically as needed. 30 g 0   lidocaine-prilocaine (EMLA) cream Apply to affected area once 30 g 3   loratadine (CLARITIN) 10 MG tablet Take 10 mg by mouth daily.     loratadine (CLARITIN) 10 MG tablet Take 1 tablet (10 mg total) by mouth  daily. 60 tablet 1   LORazepam (ATIVAN) 0.5 MG tablet Take 1 tablet (0.5 mg total) by mouth at bedtime as needed (Nausea or vomiting). 30 tablet 0   Multiple Vitamin (MULTIVITAMIN WITH MINERALS) TABS tablet Take 1 tablet by mouth daily.     nabumetone (RELAFEN) 500 MG tablet Take 500 mg by mouth 2 (two) times daily as needed.     Omega-3 Fatty Acids (FISH OIL) 1200 MG CAPS Take 1,200 mg by mouth daily.     ondansetron (ZOFRAN) 8 MG tablet Take 1 tablet (8 mg total) by mouth every 8 (eight) hours as needed for nausea or vomiting. 20 tablet 0   ondansetron (ZOFRAN) 8 MG tablet Take 1 tablet (8 mg total) by mouth every 8 (eight) hours as needed for nausea or vomiting. May start no earlier than day 3 of a chemotherapy cycle. 20 tablet 4   prochlorperazine (COMPAZINE) 10 MG tablet Take 1 tablet (10 mg total) by mouth every 6 (six) hours as needed for nausea or vomiting. 30 tablet 0   prochlorperazine (COMPAZINE) 10 MG tablet Take 1 tablet (10 mg total) by mouth every 6 (six) hours as needed (Nausea or vomiting). 30 tablet 1   No current facility-administered medications for this visit.    OBJECTIVE: Middle-aged white woman in no acute distress  Vitals:   08/20/19 1252  BP: (!) 141/69  Pulse: 84  Resp: 18  Temp: 98.5 F (36.9 C)  SpO2: 99%     Body mass index is 35.7 kg/m.   Wt Readings from Last 3 Encounters:  08/20/19 182 lb 12.8 oz (82.9 kg)  08/06/19 185 lb 8 oz (84.1 kg)  07/29/19 185 lb 13.6 oz (84.3 kg)      ECOG FS:1 - Symptomatic but completely ambulatory  GENERAL: Patient is a well appearing female in no acute distress HEENT:  Sclerae anicteric.  +thrush noted on tongue, no ulcerations Neck is supple.  NODES:  No cervical, supraclavicular, or axillary lymphadenopathy palpated.  BREAST EXAM:  Deferred. LUNGS:  Clear to auscultation bilaterally.  No wheezes or rhonchi. HEART:  Regular rate and rhythm. No murmur appreciated. ABDOMEN:  Soft, nontender.  Positive,  normoactive bowel sounds. No organomegaly palpated. MSK:  No focal spinal tenderness to palpation. Full range of motion bilaterally in the upper extremities. EXTREMITIES:  No peripheral edema.   SKIN:  Clear  with no obvious rashes or skin changes. No nail dyscrasia. NEURO:  Nonfocal. Well oriented.  Appropriate affect.     LAB RESULTS:  CMP     Component Value Date/Time   NA 143 08/13/2019 0853   K 4.0 08/13/2019 0853   CL 110 08/13/2019 0853   CO2 27 08/13/2019 0853   GLUCOSE 113 (H) 08/13/2019 0853   BUN 8 08/13/2019 0853   CREATININE 0.79 08/13/2019 0853   CREATININE 0.94 07/17/2019 0823   CALCIUM 8.7 (L) 08/13/2019 0853   PROT 6.3 (L) 08/13/2019 0853   ALBUMIN 3.4 (L) 08/13/2019 0853   AST 16 08/13/2019 0853   AST 21 07/17/2019 0823   ALT 14 08/13/2019 0853   ALT 18 07/17/2019 0823   ALKPHOS 83 08/13/2019 0853   BILITOT <0.2 (L) 08/13/2019 0853   BILITOT 0.4 07/17/2019 0823   GFRNONAA >60 08/13/2019 0853   GFRNONAA >60 07/17/2019 0823   GFRAA >60 08/13/2019 0853   GFRAA >60 07/17/2019 0823    No results found for: TOTALPROTELP, ALBUMINELP, A1GS, A2GS, BETS, BETA2SER, GAMS, MSPIKE, SPEI  Lab Results  Component Value Date   WBC 0.7 (LL) 08/20/2019   NEUTROABS PENDING 08/20/2019   HGB 10.2 (L) 08/20/2019   HCT 32.0 (L) 08/20/2019   MCV 96.4 08/20/2019   PLT 153 08/20/2019    No results found for: LABCA2  No components found for: FOYDXA128  No results for input(s): INR in the last 168 hours.  No results found for: LABCA2  No results found for: NOM767  No results found for: MCN470  No results found for: JGG836  No results found for: CA2729  No components found for: HGQUANT  No results found for: CEA1 / No results found for: CEA1   No results found for: AFPTUMOR  No results found for: CHROMOGRNA  No results found for: KPAFRELGTCHN, LAMBDASER, KAPLAMBRATIO (kappa/lambda light chains)  No results found for: HGBA, HGBA2QUANT, HGBFQUANT,  HGBSQUAN (Hemoglobinopathy evaluation)   No results found for: LDH  No results found for: IRON, TIBC, IRONPCTSAT (Iron and TIBC)  No results found for: FERRITIN  Urinalysis No results found for: COLORURINE, APPEARANCEUR, LABSPEC, PHURINE, GLUCOSEU, HGBUR, BILIRUBINUR, KETONESUR, PROTEINUR, UROBILINOGEN, NITRITE, LEUKOCYTESUR   STUDIES: CT Chest W Contrast  Result Date: 07/31/2019 CLINICAL DATA:  New diagnosis left breast cancer, ongoing chemotherapy. EXAM: CT CHEST WITH CONTRAST TECHNIQUE: Multidetector CT imaging of the chest was performed during intravenous contrast administration. CONTRAST:  47m OMNIPAQUE IOHEXOL 300 MG/ML  SOLN COMPARISON:  None. FINDINGS: Cardiovascular: Right IJ Port-A-Cath terminates in the high right atrium. Atherosclerotic calcification of the aorta. Pulmonic trunk and heart are enlarged. No pericardial effusion. Mediastinum/Nodes: No pathologically enlarged mediastinal, hilar, right axillary or internal mammary lymph nodes. A 9 mm lymph node in the left axilla is seen in association with a localization clip (2/52). Esophagus is unremarkable. Lungs/Pleura: Image quality is degraded by respiratory motion. Subpleural nodules in the apical left upper lobe measure up to 3 mm and are likely benign. Scarring in the lingula and left lower lobe. There may be additional millimetric subpleural nodules along the left hemidiaphragm, also likely benign subpleural lymph nodes. No pleural fluid. Airway is unremarkable. Upper Abdomen: Visualized portions of the liver, gallbladder and adrenal glands are unremarkable. Low-attenuation lesions in the kidneys measure up to 1.5 cm on the right and are likely cysts. Visualized portions of the kidneys, spleen, pancreas, stomach and bowel are otherwise unremarkable. Musculoskeletal: Postoperative changes of recent lumpectomy in the subareolar left breast. There are 2  additional areas of nodular soft tissue in the inferomedial left breast,  measuring up to 1.7 cm (2/83) with associated localization clips. IMPRESSION: 1. Left axillary lymph node is at the upper limits of normal in size. Postoperative changes in the left breast with additional nodules in the inferomedial left breast. No evidence of intrathoracic metastatic disease. 2. Millimetric subpleural nodules in the left lung are likely benign. Continued attention on follow-up exams is suggested. 3. Aortic atherosclerosis (ICD10-I70.0). 4. Enlarged pulmonic trunk, indicative of pulmonary arterial hypertension. Electronically Signed   By: Lorin Picket M.D.   On: 07/31/2019 13:01   NM Bone Scan Whole Body  Result Date: 07/31/2019 CLINICAL DATA:  LEFT breast cancer, staging EXAM: NUCLEAR MEDICINE WHOLE BODY BONE SCAN TECHNIQUE: Whole body anterior and posterior images were obtained approximately 3 hours after intravenous injection of radiopharmaceutical. RADIOPHARMACEUTICALS:  19.8 mCi Technetium-64mMDP IV COMPARISON:  None Radiographic correlation: CT chest 07/31/2019 FINDINGS: Uptake at shoulders, elbows, hips, knees, feet, typically degenerative. No worrisome sites of tracer uptake are identified to suggest osseous metastatic disease. Expected urinary tract and soft tissue distribution of tracer. IMPRESSION: No scintigraphic evidence of osseous metastatic disease. Electronically Signed   By: MLavonia DanaM.D.   On: 07/31/2019 16:34   MR BREAST BILATERAL W WO CONTRAST  Result Date: 07/25/2019 CLINICAL DATA:  74year old female with recent diagnosis of grade 3 invasive ductal carcinoma involving the inferior left breast and a metastatic left axillary lymph node on ultrasound guided biopsy from 07/09/2019. She had a stereotactic biopsy of a small group of calcifications in the left breast with benign pathology (fibrocystic changes) on 07/10/2019. She presents for MRI to determine the extent of disease. LABS:  Creatinine of 0.94 mg/dL and GFR of greater than 60 on 07/17/2019. EXAM: BILATERAL  BREAST MRI WITH AND WITHOUT CONTRAST TECHNIQUE: Multiplanar, multisequence MR images of both breasts were obtained prior to and following the intravenous administration of 8 ml of Gadavist Three-dimensional MR images were rendered by post-processing of the original MR data on an independent workstation. The three-dimensional MR images were interpreted, and findings are reported in the following complete MRI report for this study. Three dimensional images were evaluated at the independent DynaCad workstation COMPARISON:  No prior MRI available for comparison. Correlation made with prior mammograms and ultrasounds. FINDINGS: Breast composition: c. Heterogeneous fibroglandular tissue. Background parenchymal enhancement: Moderate. Right breast: In the central right breast into the lower outer quadrant, there are a few subcentimeter scattered enhancing masses in a segmental distribution spanning approximately 6 cm in the anterior to posterior dimension, best represented in the MIP images. One of the anterior-most lesions is 4 mm, and is seen in series 10701, image 163 and demonstrates mixed kinetics. One of the posterior most lesions is seen in the inferior central right breast in series 10701, image 156. This lesion measures 7 mm and demonstrates predominantly persistent kinetics. Left breast: The biopsy-proven malignancy in the inferior left breast of (series 10701, image 171) is one contiguous bilobed mass with central necrosis measuring 3.4 x 2.4 x 2.0 cm. Superior, anterior and slightly medial to the mass, there is a rim enhancing 1.4 cm lesion associated with susceptibility artifact, compatible with the biopsy marking clip from the benign stereotactic biopsy. There is patchy discontinuous linear enhancement extending from the lateral skin surface towards the biopsy site, very likely representing the biopsy tract (series 10701, between images 151 through 158). There are two 6 mm masses in the lateral left breast,  middle and posterior depth,  which are both superior and lateral to the known cancer. These are near, but not definitely part of the suspected biopsy tract. Both of these masses can be seen together in series 10701, image 151. There is an elongated 7 mm enhancing mass with washout kinetics in the lateral posterior left breast which corresponds with a small intramammary lymph node on the mammogram. Lymph nodes: No abnormal appearing lymph nodes in the right axilla. A lymph node in the left axilla with associated susceptibility from the biopsy marking clip is consistent with the biopsy proven metastatic lymph node. No other abnormal left axillary lymph nodes are identified. Ancillary findings:  None. IMPRESSION: 1. The bilobed biopsy-proven cancer in the inferior left breast measures 3.4 cm. 2. The biopsy proven metastatic left axillary lymph node is the only abnormal lymph node in the left axilla. 2. Discontinuous linear enhancement from the lateral left breast toward the benign biopsy site of calcifications likely represents the biopsy tract. However, there are two 6 mm masses close to the biopsy tract but are not definitely part of it. These are indeterminate. 2. There are scattered masses spanning approximately 6 cm in the central to lower outer right breast in a segmental distribution. 4.  No abnormal right axillary lymph nodes are identified. RECOMMENDATION: 1. MRI guided biopsy is recommended for the two 6 mm masses in the lateral left breast if breast conservation is being considered. 2. MRI guided biopsy is recommended for the anterior and posterior margin of the scattered segmentally distributed masses in the central to lower outer right breast. Though the anterior lesion is only 4 mm (image 163), the kinetics of this lesion is the most suspicious and if possible, this should be the target for the anterior margin. BI-RADS CATEGORY  4: Suspicious. Electronically Signed   By: Ammie Ferrier M.D.   On:  07/25/2019 12:04   DG Chest Port 1 View  Result Date: 07/29/2019 CLINICAL DATA:  Port-A-Cath placement.  Breast cancer EXAM: PORTABLE CHEST 1 VIEW COMPARISON:  None. FINDINGS: Right jugular Port-A-Cath tip at the cavoatrial junction. No pneumothorax Asymmetric density right hilum possibly due to mass or adenopathy. There is dextroscoliosis which could be affecting evaluation of the hilum. Nevertheless, recommend further evaluation with CT chest with contrast. Negative for heart failure infiltrate or effusion. Lungs well aerated IMPRESSION: Satisfactory Port-A-Cath placement Possible right hilar mass/adenopathy. Recommend CT chest with contrast. Electronically Signed   By: Franchot Gallo M.D.   On: 07/29/2019 13:55   DG Fluoro Guide CV Line-No Report  Result Date: 07/29/2019 Fluoroscopy was utilized by the requesting physician.  No radiographic interpretation.   ECHOCARDIOGRAM COMPLETE  Result Date: 07/25/2019   ECHOCARDIOGRAM REPORT   Patient Name:   TARYNE KIGER Date of Exam: 07/25/2019 Medical Rec #:  [id number]     Height:       60.0 in Accession #:    [id number]    Weight:       185.2 lb Date of Birth:  06-14-1946     BSA:          1.81 m Patient Age:    74 years      BP:           165/79 mmHg Patient Gender: F             HR:           94 bpm. Exam Location:  Outpatient Procedure: 2D Echo, Cardiac Doppler and Color Doppler Indications:    Chemo evaluation  History:        Patient has no prior history of Echocardiogram examinations.                 Breast cancer.  Sonographer:    Dustin Flock Referring Phys: [place]  1. Left ventricular ejection fraction, by visual estimation, is 60 to 65%. The left ventricle has normal function. There is mildly increased left ventricular hypertrophy.  2. Left ventricular diastolic parameters are consistent with Grade I diastolic dysfunction (impaired relaxation).  3. The left ventricle has no regional wall motion abnormalities.  4.  Global right ventricle has normal systolic function.The right ventricular size is normal. No increase in right ventricular wall thickness.  5. Left atrial size was normal.  6. Right atrial size was normal.  7. The mitral valve is normal in structure. No evidence of mitral valve regurgitation. No evidence of mitral stenosis.  8. The tricuspid valve is normal in structure.  9. The tricuspid valve is normal in structure. Tricuspid valve regurgitation is not demonstrated. 10. The aortic valve is normal in structure. Aortic valve regurgitation is trivial. No evidence of aortic valve sclerosis or stenosis. 11. The pulmonic valve was normal in structure. Pulmonic valve regurgitation is not visualized. 12. The inferior vena cava is normal in size with greater than 50% respiratory variability, suggesting right atrial pressure of 3 mmHg. 13. The average left ventricular global longitudinal strain is -14.8 %. FINDINGS  Left Ventricle: Left ventricular ejection fraction, by visual estimation, is 60 to 65%. The left ventricle has normal function. The average left ventricular global longitudinal strain is -14.8 %. The left ventricle has no regional wall motion abnormalities. There is mildly increased left ventricular hypertrophy. Left ventricular diastolic parameters are consistent with Grade I diastolic dysfunction (impaired relaxation). Normal left atrial pressure. Right Ventricle: The right ventricular size is normal. No increase in right ventricular wall thickness. Global RV systolic function is has normal systolic function. Left Atrium: Left atrial size was normal in size. Right Atrium: Right atrial size was normal in size Pericardium: There is no evidence of pericardial effusion. Mitral Valve: The mitral valve is normal in structure. No evidence of mitral valve regurgitation. No evidence of mitral valve stenosis by observation. Tricuspid Valve: The tricuspid valve is normal in structure. Tricuspid valve regurgitation is not  demonstrated. Aortic Valve: The aortic valve is normal in structure. Aortic valve regurgitation is trivial. Aortic regurgitation PHT measures 648 msec. The aortic valve is structurally normal, with no evidence of sclerosis or stenosis. Pulmonic Valve: The pulmonic valve was normal in structure. Pulmonic valve regurgitation is not visualized. Pulmonic regurgitation is not visualized. Aorta: The aortic root, ascending aorta and aortic arch are all structurally normal, with no evidence of dilitation or obstruction. Venous: The inferior vena cava is normal in size with greater than 50% respiratory variability, suggesting right atrial pressure of 3 mmHg. IAS/Shunts: No atrial level shunt detected by color flow Doppler. There is no evidence of a patent foramen ovale. No ventricular septal defect is seen or detected. There is no evidence of an atrial septal defect.  LEFT VENTRICLE PLAX 2D LVIDd:         4.00 cm  Diastology LVIDs:         2.50 cm  LV e' lateral:   6.96 cm/s LV PW:         1.30 cm  LV E/e' lateral: 11.4 LV IVS:        1.50 cm  LV e'  medial:    7.51 cm/s LVOT diam:     2.10 cm  LV E/e' medial:  10.6 LV SV:         48 ml LV SV Index:   24.72    2D Longitudinal Strain LVOT Area:     3.46 cm 2D Strain GLS Avg:     -14.8 %  RIGHT VENTRICLE RV Basal diam:  2.90 cm RV S prime:     6.64 cm/s TAPSE (M-mode): 2.2 cm LEFT ATRIUM             Index       RIGHT ATRIUM           Index LA diam:        3.60 cm 1.99 cm/m  RA Area:     10.10 cm LA Vol (A2C):   37.1 ml 20.54 ml/m RA Volume:   19.40 ml  10.74 ml/m LA Vol (A4C):   38.0 ml 21.03 ml/m LA Biplane Vol: 40.5 ml 22.42 ml/m  AORTIC VALVE LVOT Vmax:   114.00 cm/s LVOT Vmean:  77.100 cm/s LVOT VTI:    0.247 m AI PHT:      648 msec  AORTA Ao Root diam: 3.00 cm MITRAL VALVE MV Area (PHT): 3.42 cm             SHUNTS MV PHT:        64.38 msec           Systemic VTI:  0.25 m MV Decel Time: 222 msec             Systemic Diam: 2.10 cm MV E velocity: 79.30 cm/s 103 cm/s  MV A velocity: 93.00 cm/s 70.3 cm/s MV E/A ratio:  0.85       1.5  Candee Furbish MD Electronically signed by Candee Furbish MD Signature Date/Time: 07/25/2019/5:30:25 PM    Final    MM CLIP PLACEMENT LEFT  Result Date: 07/29/2019 CLINICAL DATA:  Patient underwent MRI guided biopsy on 07/26/2019 for 2 areas in the right breast and a single area in the left breast. Patient has biopsy-proven left breast malignancy. EXAM: DIAGNOSTIC BILATERAL MAMMOGRAM POST MRI BIOPSY COMPARISON:  Previous exam(s). FINDINGS: Mammographic images were obtained following MRI guided biopsy of 2 lesions in the right breast and a single additional lesion in the left breast. The left breast biopsy marker clip, which is a dumbbell clip, lies in the medial, slightly inferior aspect of the left breast, anterior to the 2 X shaped biopsy clips and the more posterior ribbon shaped biopsy clip, the latter localizing the known breast carcinoma. On the right, the cylinder shaped biopsy clip lies inferiorly and the dumbbell shaped biopsy clip lies laterally in the expected location of the MRI lesions. IMPRESSION: Appropriate positioning of the bilateral dumbbell shaped shaped and right breast cylinder shaped biopsy marking clips at the site of biopsy in the right breast inferiorly and laterally and the left breast anterior lower inner quadrant. Final Assessment: Post Procedure Mammograms for Marker Placement Electronically Signed   By: Lajean Manes M.D.   On: 07/29/2019 09:42   MM CLIP PLACEMENT RIGHT  Result Date: 07/29/2019 CLINICAL DATA:  Patient underwent MRI guided biopsy on 07/26/2019 for 2 areas in the right breast and a single area in the left breast. Patient has biopsy-proven left breast malignancy. EXAM: DIAGNOSTIC BILATERAL MAMMOGRAM POST MRI BIOPSY COMPARISON:  Previous exam(s). FINDINGS: Mammographic images were obtained following MRI guided biopsy of 2 lesions in the right breast and  a single additional lesion in the left breast. The  left breast biopsy marker clip, which is a dumbbell clip, lies in the medial, slightly inferior aspect of the left breast, anterior to the 2 X shaped biopsy clips and the more posterior ribbon shaped biopsy clip, the latter localizing the known breast carcinoma. On the right, the cylinder shaped biopsy clip lies inferiorly and the dumbbell shaped biopsy clip lies laterally in the expected location of the MRI lesions. IMPRESSION: Appropriate positioning of the bilateral dumbbell shaped shaped and right breast cylinder shaped biopsy marking clips at the site of biopsy in the right breast inferiorly and laterally and the left breast anterior lower inner quadrant. Final Assessment: Post Procedure Mammograms for Marker Placement Electronically Signed   By: Lajean Manes M.D.   On: 07/29/2019 09:42   MR LT BREAST BX W LOC DEV 1ST LESION IMAGE BX SPEC MR GUIDE  Addendum Date: 07/31/2019   ADDENDUM REPORT: 07/30/2019 18:08 ADDENDUM: Pathology revealed ADENOSIS AND FIBROCYSTIC CHANGES WITH CALCIFICATIONS of the RIGHT breast, 9 o'clock posterior. This was found to be concordant by Dr. Lajean Manes. Pathology revealed ATYPICAL DUCTAL HYPERPLASIA, ADENOSIS AND FIBROCYSTIC CHANGES, CALCIFICATIONS of the RIGHT breast, anterior, lower outer quadrant. This was found to be concordant by Dr. Lajean Manes. 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 This was found to be concordant by Dr. Lajean Manes. Pathology results were discussed with the patient by telephone. The patient reported doing well after the biopsies with tenderness at the sites. Post biopsy instructions and care were reviewed and questions were answered. The patient was encouraged to call [facility] for any additional concerns. The patient has a recent diagnosis of left breast cancer and should follow her outlined treatment plan and surgical management. Pathology results  reported by Stacie Acres RN on 07/30/2019. Electronically Signed   By: Lajean Manes M.D.   On: 07/30/2019 18:08   Result Date: 07/31/2019 CLINICAL DATA:  Patient has known left breast carcinoma. She underwent ultrasound-guided biopsy of 2 left breast masses, which revealed invasive carcinoma. She also underwent stereotactic core needle biopsy of left breast calcifications which were benign. Staging MRI showed additional areas of abnormal enhancement in the right breast and separate from the known carcinoma is in the left breast. One of these areas on the left and 2 areas on the right are being targeted for MRI guided biopsy to assess for extent of disease. EXAM: MRI GUIDED CORE NEEDLE BIOPSY OF THE RIGHT BREAST: 2 LESIONS BIOPSIED. MRI GUIDED CORE NEEDLE BIOPSY OF THE LEFT BREAST: 1 LESION BIOPSIED. TECHNIQUE: Multiplanar, multisequence MR imaging of the right and left breasts was performed both before and after administration of intravenous contrast. CONTRAST:  8 mL of Gadavist intravenous contrast COMPARISON:  Previous exams. FINDINGS: I met with the patient, and we discussed the procedure of MRI guided biopsy, including risks, benefits, and alternatives. Specifically, we discussed the risks of infection, bleeding, tissue injury, clip migration, and inadequate sampling. Informed, written consent was given. The usual time out protocol was performed immediately prior to the procedure. Biopsy #1: Lesion location, right breast, posterior near 9 o'clock. Using sterile technique, 1% Lidocaine, MRI guidance, and a 9 gauge vacuum assisted device, biopsy was performed of an area mass and non mass enhancement using a lateral approach. At the conclusion of the procedure, a barbell shaped tissue marker clip was deployed into the biopsy cavity. Biopsy #2: Lesion location, right breast, lower,  outer quadrant, anterior. Using sterile technique, 1% Lidocaine, MRI guidance, and a 9 gauge vacuum assisted device, biopsy was  performed of the small enhancing mass using a lateral approach. At the conclusion of the procedure, a cylinder shaped tissue marker clip was deployed into the biopsy cavity. Biopsy #3: Lesion location, left breast near 8:30 o'clock. Using sterile technique, 1% Lidocaine, MRI guidance, and a 9 gauge vacuum assisted device, biopsy was performed of a small enhancing mass using a lateral approach. At the conclusion of the procedure, a cylinder shaped tissue marker clip was deployed into the biopsy cavity. Follow-up 2-view mammogram was performed and dictated separately. IMPRESSION: MRI guided biopsy of enhancing abnormalities, 2 in the right and 1 on the left breasts. No apparent complications. Electronically Signed: By: Lajean Manes M.D. On: 07/26/2019 19:51   MR RT BREAST BX W LOC DEV 1ST LESION IMAGE BX SPEC MR GUIDE  Addendum Date: 07/31/2019   ADDENDUM REPORT: 07/30/2019 18:08 ADDENDUM: Pathology revealed ADENOSIS AND FIBROCYSTIC CHANGES WITH CALCIFICATIONS of the RIGHT breast, 9 o'clock posterior. This was found to be concordant by Dr. Lajean Manes. Pathology revealed ATYPICAL DUCTAL HYPERPLASIA, ADENOSIS AND FIBROCYSTIC CHANGES, CALCIFICATIONS of the RIGHT breast, anterior, lower outer quadrant. This was found to be concordant by Dr. Lajean Manes. 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 This was found to be concordant by Dr. Lajean Manes. Pathology results were discussed with the patient by telephone. The patient reported doing well after the biopsies with tenderness at the sites. Post biopsy instructions and care were reviewed and questions were answered. The patient was encouraged to call [facility] for any additional concerns. The patient has a recent diagnosis of left breast cancer and should follow her outlined treatment plan and surgical management. Pathology results reported by Stacie Acres RN on 07/30/2019.  Electronically Signed   By: Lajean Manes M.D.   On: 07/30/2019 18:08   Result Date: 07/31/2019 CLINICAL DATA:  Patient has known left breast carcinoma. She underwent ultrasound-guided biopsy of 2 left breast masses, which revealed invasive carcinoma. She also underwent stereotactic core needle biopsy of left breast calcifications which were benign. Staging MRI showed additional areas of abnormal enhancement in the right breast and separate from the known carcinoma is in the left breast. One of these areas on the left and 2 areas on the right are being targeted for MRI guided biopsy to assess for extent of disease. EXAM: MRI GUIDED CORE NEEDLE BIOPSY OF THE RIGHT BREAST: 2 LESIONS BIOPSIED. MRI GUIDED CORE NEEDLE BIOPSY OF THE LEFT BREAST: 1 LESION BIOPSIED. TECHNIQUE: Multiplanar, multisequence MR imaging of the right and left breasts was performed both before and after administration of intravenous contrast. CONTRAST:  8 mL of Gadavist intravenous contrast COMPARISON:  Previous exams. FINDINGS: I met with the patient, and we discussed the procedure of MRI guided biopsy, including risks, benefits, and alternatives. Specifically, we discussed the risks of infection, bleeding, tissue injury, clip migration, and inadequate sampling. Informed, written consent was given. The usual time out protocol was performed immediately prior to the procedure. Biopsy #1: Lesion location, right breast, posterior near 9 o'clock. Using sterile technique, 1% Lidocaine, MRI guidance, and a 9 gauge vacuum assisted device, biopsy was performed of an area mass and non mass enhancement using a lateral approach. At the conclusion of the procedure, a barbell shaped tissue marker clip was deployed into the biopsy cavity. Biopsy #2: Lesion location, right breast, lower,  outer quadrant, anterior. Using sterile technique, 1% Lidocaine, MRI guidance, and a 9 gauge vacuum assisted device, biopsy was performed of the small enhancing mass using a  lateral approach. At the conclusion of the procedure, a cylinder shaped tissue marker clip was deployed into the biopsy cavity. Biopsy #3: Lesion location, left breast near 8:30 o'clock. Using sterile technique, 1% Lidocaine, MRI guidance, and a 9 gauge vacuum assisted device, biopsy was performed of a small enhancing mass using a lateral approach. At the conclusion of the procedure, a cylinder shaped tissue marker clip was deployed into the biopsy cavity. Follow-up 2-view mammogram was performed and dictated separately. IMPRESSION: MRI guided biopsy of enhancing abnormalities, 2 in the right and 1 on the left breasts. No apparent complications. Electronically Signed: By: Lajean Manes M.D. On: 07/26/2019 19:51   MR RT BREAST BX W LOC DEV EA ADD LESION IMAGE BX SPEC MR GUIDE  Addendum Date: 07/31/2019   ADDENDUM REPORT: 07/30/2019 18:08 ADDENDUM: Pathology revealed ADENOSIS AND FIBROCYSTIC CHANGES WITH CALCIFICATIONS of the RIGHT breast, 9 o'clock posterior. This was found to be concordant by Dr. Lajean Manes. Pathology revealed ATYPICAL DUCTAL HYPERPLASIA, ADENOSIS AND FIBROCYSTIC CHANGES, CALCIFICATIONS of the RIGHT breast, anterior, lower outer quadrant. This was found to be concordant by Dr. Lajean Manes. 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 This was found to be concordant by Dr. Lajean Manes. Pathology results were discussed with the patient by telephone. The patient reported doing well after the biopsies with tenderness at the sites. Post biopsy instructions and care were reviewed and questions were answered. The patient was encouraged to call [facility] for any additional concerns. The patient has a recent diagnosis of left breast cancer and should follow her outlined treatment plan and surgical management. Pathology results reported by Stacie Acres RN on 07/30/2019. Electronically Signed   By: Lajean Manes  M.D.   On: 07/30/2019 18:08   Result Date: 07/31/2019 CLINICAL DATA:  Patient has known left breast carcinoma. She underwent ultrasound-guided biopsy of 2 left breast masses, which revealed invasive carcinoma. She also underwent stereotactic core needle biopsy of left breast calcifications which were benign. Staging MRI showed additional areas of abnormal enhancement in the right breast and separate from the known carcinoma is in the left breast. One of these areas on the left and 2 areas on the right are being targeted for MRI guided biopsy to assess for extent of disease. EXAM: MRI GUIDED CORE NEEDLE BIOPSY OF THE RIGHT BREAST: 2 LESIONS BIOPSIED. MRI GUIDED CORE NEEDLE BIOPSY OF THE LEFT BREAST: 1 LESION BIOPSIED. TECHNIQUE: Multiplanar, multisequence MR imaging of the right and left breasts was performed both before and after administration of intravenous contrast. CONTRAST:  8 mL of Gadavist intravenous contrast COMPARISON:  Previous exams. FINDINGS: I met with the patient, and we discussed the procedure of MRI guided biopsy, including risks, benefits, and alternatives. Specifically, we discussed the risks of infection, bleeding, tissue injury, clip migration, and inadequate sampling. Informed, written consent was given. The usual time out protocol was performed immediately prior to the procedure. Biopsy #1: Lesion location, right breast, posterior near 9 o'clock. Using sterile technique, 1% Lidocaine, MRI guidance, and a 9 gauge vacuum assisted device, biopsy was performed of an area mass and non mass enhancement using a lateral approach. At the conclusion of the procedure, a barbell shaped tissue marker clip was deployed into the biopsy cavity. Biopsy #2: Lesion location, right breast,  lower, outer quadrant, anterior. Using sterile technique, 1% Lidocaine, MRI guidance, and a 9 gauge vacuum assisted device, biopsy was performed of the small enhancing mass using a lateral approach. At the conclusion of the  procedure, a cylinder shaped tissue marker clip was deployed into the biopsy cavity. Biopsy #3: Lesion location, left breast near 8:30 o'clock. Using sterile technique, 1% Lidocaine, MRI guidance, and a 9 gauge vacuum assisted device, biopsy was performed of a small enhancing mass using a lateral approach. At the conclusion of the procedure, a cylinder shaped tissue marker clip was deployed into the biopsy cavity. Follow-up 2-view mammogram was performed and dictated separately. IMPRESSION: MRI guided biopsy of enhancing abnormalities, 2 in the right and 1 on the left breasts. No apparent complications. Electronically Signed: By: Lajean Manes M.D. On: 07/26/2019 19:51     ELIGIBLE FOR AVAILABLE RESEARCH PROTOCOL: no  ASSESSMENT: 74 y.o. [place] woman status post left breast lower outer quadrant biopsy 07/09/2019 for a clinical T2 N1, stage IIIB invasive ductal carcinoma, grade 3, triple negative, with an MIB-1 of 60%  (a) chest CT scan and bone scan 07/31/2019 showed no evidence of metastatic disease  (b) MRI biopsy of 2 additional areas in the left breast (central, 07/10/2019, and at 830 o'clock on 07/26/2019) are both benign and concordant  (c) biopsy of 2 suspicious areas in the right breast on 07/26/2019 (9:00 and lower outer quadrant) were benign and concordant   (1) neoadjuvant chemotherapy to consist of cyclophosphamide and doxorubicin in dose dense fashion x4 to start 07/30/2019 followed by weekly carboplatin and paclitaxel x12  (2) definitive surgery to follow with targeted axillary lymph node dissection  (3) adjuvant radiation to follow  (4) genetics testing 07/23/2019 through the STAT Breast cancer panel offered by [facility] found no deleterious mutations in ATM, BRCA1, BRCA2, CDH1, CHEK2, PALB2, PTEN, STK11 and TP53.  Additional testing through the Common Hereditary Cancers Panel offered by [facility] also found no deleterious mutations in IPC, ATM, AXIN2, BARD1, BMPR1A, BRCA1, BRCA2,  BRIP1, CDH1, CDK4, CDKN2A (p14ARF), CDKN2A (p16INK4a), CHEK2, CTNNA1, DICER1, EPCAM (Deletion/duplication testing only), GREM1 (promoter region deletion/duplication testing only), KIT, MEN1, MLH1, MSH2, MSH3, MSH6, MUTYH, NBN, NF1, NHTL1, PALB2, PDGFRA, PMS2, POLD1, POLE, PTEN, RAD50, RAD51C, RAD51D, RNF43, SDHB, SDHC, SDHD, SMAD4, SMARCA4. STK11, TP53, TSC1, TSC2, and VHL.  The following genes were evaluated for sequence changes only: SDHA and HOXB13 c.251G>A variant only.   (a) 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    PLAN: Chinmayi continues on neoadjuvant chemotherapy with doxorubicin and cyclophosphamide and is tolerating it moderately well other than some back pain from the neulasta along with some thrush.  She will continue on this.   Luciann is neutropenic today and has started back on cipro.  I reviewed neutropenic precuations in detail.   Eavan also has some thrush and I sent in Diflucan for her to take daily for a week.  She will take this the week of chemo.  This will hopefully help with her sore throat.   Elysa will return in 1 week for labs, f/u and her next treatment.  She was recommended to continue with the appropriate pandemic precautions. She knows to call for any questions that may arise between now and her next appointment.  We are happy to see her sooner if needed.   Total encounter time 30 minutes.Wilber Bihari, NP   08/20/2019 1:07 PM Medical Oncology and Hematology [address]  [place], [place] [address] Tel. [phone]    Fax. [phone]    *Total Encounter Time as defined by the Centers for Medicare and Medicaid Services includes, in addition to the face-to-face time of a patient visit (documented in the note above) non-face-to-face time: obtaining and reviewing outside history, ordering and reviewing medications, tests or procedures, care coordination (communications with other health  care professionals or caregivers) and documentation in the medical record.

## 2019-08-20 NOTE — Telephone Encounter (Signed)
I talk with patient regarding schedule  

## 2019-08-21 ENCOUNTER — Encounter: Payer: Self-pay | Admitting: *Deleted

## 2019-08-27 ENCOUNTER — Inpatient Hospital Stay: Payer: Medicare Other | Admitting: Adult Health

## 2019-08-27 ENCOUNTER — Inpatient Hospital Stay: Payer: Medicare Other

## 2019-08-27 ENCOUNTER — Encounter: Payer: Self-pay | Admitting: Adult Health

## 2019-08-27 ENCOUNTER — Other Ambulatory Visit: Payer: Self-pay

## 2019-08-27 VITALS — BP 118/84 | HR 99 | Temp 98.5°F | Resp 18 | Ht 60.0 in | Wt 184.5 lb

## 2019-08-27 DIAGNOSIS — Z7982 Long term (current) use of aspirin: Secondary | ICD-10-CM | POA: Diagnosis not present

## 2019-08-27 DIAGNOSIS — Z5111 Encounter for antineoplastic chemotherapy: Secondary | ICD-10-CM | POA: Diagnosis not present

## 2019-08-27 DIAGNOSIS — M549 Dorsalgia, unspecified: Secondary | ICD-10-CM | POA: Diagnosis not present

## 2019-08-27 DIAGNOSIS — Z171 Estrogen receptor negative status [ER-]: Secondary | ICD-10-CM | POA: Diagnosis not present

## 2019-08-27 DIAGNOSIS — Z791 Long term (current) use of non-steroidal anti-inflammatories (NSAID): Secondary | ICD-10-CM | POA: Diagnosis not present

## 2019-08-27 DIAGNOSIS — C50512 Malignant neoplasm of lower-outer quadrant of left female breast: Secondary | ICD-10-CM | POA: Diagnosis not present

## 2019-08-27 DIAGNOSIS — Z803 Family history of malignant neoplasm of breast: Secondary | ICD-10-CM | POA: Diagnosis not present

## 2019-08-27 DIAGNOSIS — E039 Hypothyroidism, unspecified: Secondary | ICD-10-CM | POA: Diagnosis not present

## 2019-08-27 DIAGNOSIS — Z90710 Acquired absence of both cervix and uterus: Secondary | ICD-10-CM | POA: Diagnosis not present

## 2019-08-27 DIAGNOSIS — Z79899 Other long term (current) drug therapy: Secondary | ICD-10-CM | POA: Diagnosis not present

## 2019-08-27 DIAGNOSIS — J029 Acute pharyngitis, unspecified: Secondary | ICD-10-CM | POA: Diagnosis not present

## 2019-08-27 DIAGNOSIS — C773 Secondary and unspecified malignant neoplasm of axilla and upper limb lymph nodes: Secondary | ICD-10-CM | POA: Diagnosis not present

## 2019-08-27 DIAGNOSIS — Z5189 Encounter for other specified aftercare: Secondary | ICD-10-CM | POA: Diagnosis not present

## 2019-08-27 DIAGNOSIS — E785 Hyperlipidemia, unspecified: Secondary | ICD-10-CM | POA: Diagnosis not present

## 2019-08-27 DIAGNOSIS — Z7952 Long term (current) use of systemic steroids: Secondary | ICD-10-CM | POA: Diagnosis not present

## 2019-08-27 DIAGNOSIS — J45909 Unspecified asthma, uncomplicated: Secondary | ICD-10-CM | POA: Diagnosis not present

## 2019-08-27 LAB — CBC WITH DIFFERENTIAL/PLATELET
Abs Immature Granulocytes: 0.28 10*3/uL — ABNORMAL HIGH (ref 0.00–0.07)
Basophils Absolute: 0 10*3/uL (ref 0.0–0.1)
Basophils Relative: 1 %
Eosinophils Absolute: 0 10*3/uL (ref 0.0–0.5)
Eosinophils Relative: 0 %
HCT: 32.7 % — ABNORMAL LOW (ref 36.0–46.0)
Hemoglobin: 10.5 g/dL — ABNORMAL LOW (ref 12.0–15.0)
Immature Granulocytes: 5 %
Lymphocytes Relative: 5 %
Lymphs Abs: 0.3 10*3/uL — ABNORMAL LOW (ref 0.7–4.0)
MCH: 30.6 pg (ref 26.0–34.0)
MCHC: 32.1 g/dL (ref 30.0–36.0)
MCV: 95.3 fL (ref 80.0–100.0)
Monocytes Absolute: 0.5 10*3/uL (ref 0.1–1.0)
Monocytes Relative: 9 %
Neutro Abs: 4.6 10*3/uL (ref 1.7–7.7)
Neutrophils Relative %: 80 %
Platelets: 185 10*3/uL (ref 150–400)
RBC: 3.43 MIL/uL — ABNORMAL LOW (ref 3.87–5.11)
RDW: 13.4 % (ref 11.5–15.5)
WBC: 5.7 10*3/uL (ref 4.0–10.5)
nRBC: 0.7 % — ABNORMAL HIGH (ref 0.0–0.2)

## 2019-08-27 LAB — COMPREHENSIVE METABOLIC PANEL
ALT: 12 U/L (ref 0–44)
AST: 12 U/L — ABNORMAL LOW (ref 15–41)
Albumin: 3.3 g/dL — ABNORMAL LOW (ref 3.5–5.0)
Alkaline Phosphatase: 87 U/L (ref 38–126)
Anion gap: 8 (ref 5–15)
BUN: 14 mg/dL (ref 8–23)
CO2: 25 mmol/L (ref 22–32)
Calcium: 8.7 mg/dL — ABNORMAL LOW (ref 8.9–10.3)
Chloride: 110 mmol/L (ref 98–111)
Creatinine, Ser: 0.72 mg/dL (ref 0.44–1.00)
GFR calc Af Amer: >60 mL/min (ref >60)
GFR calc non Af Amer: >60 mL/min (ref >60)
Glucose, Bld: 113 mg/dL — ABNORMAL HIGH (ref 70–99)
Potassium: 3.8 mmol/L (ref 3.5–5.1)
Sodium: 143 mmol/L (ref 135–145)
Total Bilirubin: <0.2 mg/dL — ABNORMAL LOW (ref 0.3–1.2)
Total Protein: 6.2 g/dL — ABNORMAL LOW (ref 6.5–8.1)

## 2019-08-27 MED ORDER — PALONOSETRON HCL INJECTION 0.25 MG/5ML
0.2500 mg | Freq: Once | INTRAVENOUS | Status: AC
  Administered 2019-08-27: 10:00:00 0.25 mg via INTRAVENOUS

## 2019-08-27 MED ORDER — DEXAMETHASONE SODIUM PHOSPHATE 10 MG/ML IJ SOLN
INTRAMUSCULAR | Status: AC
  Filled 2019-08-27: qty 1

## 2019-08-27 MED ORDER — SODIUM CHLORIDE 0.9 % IV SOLN
Freq: Once | INTRAVENOUS | Status: AC
  Filled 2019-08-27: qty 250

## 2019-08-27 MED ORDER — SODIUM CHLORIDE 0.9 % IV SOLN
150.0000 mg | Freq: Once | INTRAVENOUS | Status: AC
  Administered 2019-08-27: 150 mg via INTRAVENOUS
  Filled 2019-08-27: qty 150

## 2019-08-27 MED ORDER — DOXORUBICIN HCL CHEMO IV INJECTION 2 MG/ML
51.0000 mg/m2 | Freq: Once | INTRAVENOUS | Status: AC
  Administered 2019-08-27: 11:00:00 96 mg via INTRAVENOUS
  Filled 2019-08-27: qty 48

## 2019-08-27 MED ORDER — SODIUM CHLORIDE 0.9% FLUSH
10.0000 mL | INTRAVENOUS | Status: DC | PRN
  Filled 2019-08-27: qty 10

## 2019-08-27 MED ORDER — VALACYCLOVIR HCL 500 MG PO TABS: 500.0000 mg | ORAL_TABLET | Freq: Two times a day (BID) | ORAL | 0 refills | Status: DC

## 2019-08-27 MED ORDER — HEPARIN SOD (PORK) LOCK FLUSH 100 UNIT/ML IV SOLN
500.0000 [IU] | Freq: Once | INTRAVENOUS | Status: AC | PRN
  Administered 2019-08-27: 500 [IU]
  Filled 2019-08-27: qty 5

## 2019-08-27 MED ORDER — SODIUM CHLORIDE 0.9 % IV SOLN
510.0000 mg/m2 | Freq: Once | INTRAVENOUS | Status: AC
  Administered 2019-08-27: 11:00:00 960 mg via INTRAVENOUS
  Filled 2019-08-27: qty 48

## 2019-08-27 MED ORDER — DEXAMETHASONE SODIUM PHOSPHATE 10 MG/ML IJ SOLN
10.0000 mg | Freq: Once | INTRAMUSCULAR | Status: AC
  Administered 2019-08-27: 10 mg via INTRAVENOUS

## 2019-08-27 MED ORDER — PALONOSETRON HCL INJECTION 0.25 MG/5ML
INTRAVENOUS | Status: AC
  Filled 2019-08-27: qty 5

## 2019-08-27 NOTE — Progress Notes (Signed)
[place]  [phone] [phone]     ID: Julie Thompson DOB: 13-Nov-1945  MR#: [id number]  FMB#:[id number]  Patient Care Team: Leeroy Cha, MD as PCP - General (Internal Medicine) Rockwell Germany, RN as Oncology Nurse Navigator Mauro Kaufmann, RN as Oncology Nurse Navigator Eppie Gibson, MD as Attending Physician (Radiation Oncology) Donnie Mesa, MD as Consulting Physician (General Surgery) Magrinat, Virgie Dad, MD as Consulting Physician (Oncology) Scot Dock, NP OTHER MD:  CHIEF COMPLAINT: triple negative breast cancer  CURRENT TREATMENT: Neoadjuvant chemotherapy   INTERVAL HISTORY: Julie Thompson returns today for follow up of her triple negative breast cancer unaccompanied.  She is doing well today.  She is receiving neoadjuvant chemotherapy first with doxorubicin and cyclophosphamide given on day 1 of a 14 day cycle with Neulasta support on day 3.  She is currently cycle 3 day 1.     REVIEW OF SYSTEMS: Julie Thompson is doing well today.  She had a sore throat at her last appointment.  She had some thrush, and was treated with Fluconazole and is much improved.  She has a small ulceration in the roof of her mouth, but is otherwise doing well.  She is using salt water gargles as well, which is helping, and avoiding acidic foods.  She also eats ice before and during her treatment.  Julie Thompson notes that her fatigue hasn't been as significant over the past three days.  She denies any fever, chills, nausea, vomiting, bowel/bladder changes, cough, chest pain or palpitations.  She has occasional shortness of breath with her activity, especially following being in the bed for longer periods of time.  She is working on moving more and being more active.     HISTORY OF CURRENT ILLNESS: From the original intake note:  Julie Thompson had routine screening mammography on 06/21/2019 showing a possible abnormality in the left breast. She underwent left  diagnostic mammography with tomography and left breast ultrasonography at [facility] on 07/01/2019 showing: breast density category C; either 2 adjacent masses or 1 complex dumbbell-shaped mass containing calcifications in the left breast at 6 o'clock, the largest of which measures 2.1 cm; chest wall invasion not excluded; 1-2 mm group of calcifications just anterior to the mass(es); single abnormal lymph node in the left axilla; vascular calcifications in anterior left breast.  Accordingly on 07/09/2019 she proceeded to biopsy of the left breast areas in question. The pathology from this procedure ([id number]) showed:  1. Left Breast, 5:30, posterior  - invasive ductal carcinoma with necrosis, grade 3  - Prognostic indicators significant for: estrogen receptor, 0% negative and progesterone receptor, 0% negative. Proliferation marker Ki67 at 60%. HER2 negative by immunohistochemistry (0). 2. Left Breast, 5:30, posterior  - invasive ductal carcinoma with necrosis, grade 3  - ductal carcinoma in situ, intermediate grade 3. Left Axilla, lymph node (1)  - metastatic carcinoma involving a lymph node  And additional biopsy of the anterior vascular calcifications in the left breast was performed on 07/10/2019. Pathology [id number]) showed: fibrocystic changes with calcifications; no malignancy identified.  This was felt to be concordant.  The patient's subsequent history is as detailed below.   PAST MEDICAL HISTORY: Past Medical History:  Diagnosis Date  . Asthma    no problems now  . Family history of basal cell carcinoma   . Family history of breast cancer   . Family history of colon cancer   . Family history of kidney cancer   . Family history of leukemia   .  Family history of peritoneal cancer   . GERD (gastroesophageal reflux disease)   . H/O hiatal hernia   . Hyperlipidemia   . Hypothyroidism   . IBS (irritable bowel syndrome)   . lt breast ca dx'd 07/10/19    PAST SURGICAL  HISTORY: Past Surgical History:  Procedure Laterality Date  . ABDOMINAL HYSTERECTOMY     age 89  . BREAST SURGERY    . DILATION AND CURETTAGE OF UTERUS     2 miscarriages age 56 & 71  . FLEXIBLE SIGMOIDOSCOPY N/A 10/22/2013   Procedure: FLEXIBLE SIGMOIDOSCOPY;  Surgeon: Garlan Fair, MD;  Location: [facility];  Service: Endoscopy;  Laterality: N/A;  . KNEE ARTHROSCOPY Left   . PORTACATH PLACEMENT Right 07/29/2019   Procedure: INSERTION PORT-A-CATH WITH ULTRASOUND;  Surgeon: Donnie Mesa, MD;  Location: [facility];  Service: General;  Laterality: Right;  . TONSILLECTOMY     age 58    FAMILY HISTORY: Family History  Problem Relation Age of Onset  . Breast cancer Maternal Aunt        dx. in her 67s  . Cancer Mother 67       peritoneal cancer, death certificate lists ovarian cancer  . Colon cancer Paternal Uncle        dx. late 76s or early 34s  . Basal cell carcinoma Sister 39       a second diagnosed in her 29s  . Kidney cancer Maternal Grandmother 75  . Diabetes Paternal Grandmother   . Heart Problems Paternal Grandmother   . Leukemia Paternal Uncle        dx. early 14s   Patient's father is currently living at age 5 as of August 01, 2019. Patient's mother died from ovarian cancer at age 18. She was diagnosed at age 40. The patient reports breast cancer in a maternal aunt, diagnosed in her 43's. The patient also noted colon cancer in a paternal uncle and kidney cancer in her maternal grandmother.  She has 1 sister.   GYNECOLOGIC HISTORY:  No LMP recorded. Patient has had a hysterectomy. Menarche: 60.74 years old Age at first live birth: 75 years old [place] P 2 LMP age 60 (with hysterectomy) Contraceptive: never used HRT used for 10-12 years  Hysterectomy? Yes, age 61 BSO? 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  Husband [name] is a retired Psychologist, prison and probation services. She lives at home  with husband Butch. She has two children from a prior marriage. Son [name], age 60, works as a Games developer in [place], [place]. Son [name], age 62, works as a Programme researcher, broadcasting/film/video in [place], [place]. The patient has 4 grandchildren and 4 great-grandchildren. She attends [employer].    ADVANCED DIRECTIVES: In the absence of any documentation to the contrary, the patient's spouse is their HCPOA.    HEALTH MAINTENANCE: Social History   Tobacco Use  . Smoking status: Never Smoker  . Smokeless tobacco: Never Used  Substance Use Topics  . Alcohol use: No  . Drug use: No     Colonoscopy: 09/2013, with CT virtual 11/2013  PAP: none on file, s/p hysterectomy  Bone density: 04/2001, -0.5   Allergies  Allergen Reactions  . Sulfa Antibiotics Rash  . Penicillins Hives    Current Outpatient Medications  Medication Sig Dispense Refill  . aspirin EC 81 MG tablet Take 81 mg by mouth daily.    Marland Kitchen atorvastatin (LIPITOR) 20 MG tablet Take  20 mg by mouth daily.    . Cholecalciferol (VITAMIN D) 2000 UNITS tablet Take 2,000 Units by mouth daily.    . ciprofloxacin (CIPRO) 500 MG tablet Take 1 tablet (500 mg total) by mouth 2 (two) times daily. For 5 days starting 6 days after chemotherapy 40 tablet 1  . co-enzyme Q-10 30 MG capsule Take 20 mg by mouth 3 (three) times daily.    Marland Kitchen dexamethasone (DECADRON) 4 MG tablet Take 2 tablets by mouth daily starting the day after Carboplatin and Cytoxan x 3 days. Take with food. 30 tablet 1  . esomeprazole (NEXIUM) 20 MG capsule Take 20 mg by mouth daily at 12 noon.    . famotidine (PEPCID) 20 MG tablet Take 40 mg by mouth 2 (two) times daily.    Marland Kitchen FIBER PO Take 1 tablet by mouth daily.    . fluconazole (DIFLUCAN) 200 MG tablet Take 1 tablet (200 mg total) by mouth daily. 30 tablet 0  . HYDROcodone-acetaminophen (NORCO/VICODIN) 5-325 MG tablet Take 1 tablet by mouth every 6 (six) hours as needed for moderate pain. 15 tablet 0  . levothyroxine (SYNTHROID) 50 MCG  tablet Take 50 mcg by mouth daily.    Marland Kitchen lidocaine-prilocaine (EMLA) cream Apply 1 application topically as needed. 30 g 0  . loratadine (CLARITIN) 10 MG tablet Take 1 tablet (10 mg total) by mouth daily. 60 tablet 1  . LORazepam (ATIVAN) 0.5 MG tablet Take 1 tablet (0.5 mg total) by mouth at bedtime as needed (Nausea or vomiting). 30 tablet 0  . Multiple Vitamin (MULTIVITAMIN WITH MINERALS) TABS tablet Take 1 tablet by mouth daily.    . nabumetone (RELAFEN) 500 MG tablet Take 500 mg by mouth 2 (two) times daily as needed.    . Omega-3 Fatty Acids (FISH OIL) 1200 MG CAPS Take 1,200 mg by mouth daily.    . ondansetron (ZOFRAN) 8 MG tablet Take 1 tablet (8 mg total) by mouth every 8 (eight) hours as needed for nausea or vomiting. May start no earlier than day 3 of a chemotherapy cycle. 20 tablet 4  . prochlorperazine (COMPAZINE) 10 MG tablet Take 1 tablet (10 mg total) by mouth every 6 (six) hours as needed (Nausea or vomiting). 30 tablet 1   No current facility-administered medications for this visit.    OBJECTIVE: Middle-aged white woman in no acute distress  Vitals:   08/27/19 0829  BP: 118/84  Pulse: 99  Resp: 18  Temp: 98.5 F (36.9 C)  SpO2: 98%     Body mass index is 36.03 kg/m.   Wt Readings from Last 3 Encounters:  08/27/19 184 lb 8 oz (83.7 kg)  08/20/19 182 lb 12.8 oz (82.9 kg)  08/06/19 185 lb 8 oz (84.1 kg)      ECOG FS:1 - Symptomatic but completely ambulatory  GENERAL: Patient is a well appearing female in no acute distress HEENT:  Sclerae anicteric. Julie Thompson has resolved, two ulcerations on upper anterior left palate Neck is supple.  NODES:  No cervical, supraclavicular, or axillary lymphadenopathy palpated.  BREAST EXAM:  Small 1-2 cm nodule in left lower breast, no sign of progression noted   LUNGS:  Clear to auscultation bilaterally.  No wheezes or rhonchi. HEART:  Regular rate and rhythm. No murmur appreciated. ABDOMEN:  Soft, nontender.  Positive, normoactive  bowel sounds. No organomegaly palpated. MSK:  No focal spinal tenderness to palpation. Full range of motion bilaterally in the upper extremities. EXTREMITIES:  No peripheral edema.   SKIN:  Clear with no obvious rashes or skin changes. No nail dyscrasia. NEURO:  Nonfocal. Well oriented.  Appropriate affect.     LAB RESULTS:  CMP     Component Value Date/Time   NA 141 08/20/2019 1234   K 4.6 08/20/2019 1234   CL 107 08/20/2019 1234   CO2 27 08/20/2019 1234   GLUCOSE 97 08/20/2019 1234   BUN 15 08/20/2019 1234   CREATININE 0.73 08/20/2019 1234   CREATININE 0.94 07/17/2019 0823   CALCIUM 8.5 (L) 08/20/2019 1234   PROT 6.0 (L) 08/20/2019 1234   ALBUMIN 3.3 (L) 08/20/2019 1234   AST 11 (L) 08/20/2019 1234   AST 21 07/17/2019 0823   ALT 11 08/20/2019 1234   ALT 18 07/17/2019 0823   ALKPHOS 82 08/20/2019 1234   BILITOT 0.4 08/20/2019 1234   BILITOT 0.4 07/17/2019 0823   GFRNONAA >60 08/20/2019 1234   GFRNONAA >60 07/17/2019 0823   GFRAA >60 08/20/2019 1234   GFRAA >60 07/17/2019 0823    No results found for: TOTALPROTELP, ALBUMINELP, A1GS, A2GS, BETS, BETA2SER, GAMS, MSPIKE, SPEI  Lab Results  Component Value Date   WBC 5.7 08/27/2019   NEUTROABS 4.6 08/27/2019   HGB 10.5 (L) 08/27/2019   HCT 32.7 (L) 08/27/2019   MCV 95.3 08/27/2019   PLT 185 08/27/2019    No results found for: LABCA2  No components found for: [id number]  No results for input(s): INR in the last 168 hours.  No results found for: LABCA2  No results found for: UYQ034  No results found for: VQQ595  No results found for: GLO756  No results found for: CA2729  No components found for: HGQUANT  No results found for: CEA1 / No results found for: CEA1   No results found for: AFPTUMOR  No results found for: CHROMOGRNA  No results found for: KPAFRELGTCHN, LAMBDASER, KAPLAMBRATIO (kappa/lambda light chains)  No results found for: HGBA, HGBA2QUANT, HGBFQUANT, HGBSQUAN (Hemoglobinopathy  evaluation)   No results found for: LDH  No results found for: IRON, TIBC, IRONPCTSAT (Iron and TIBC)  No results found for: FERRITIN  Urinalysis No results found for: COLORURINE, APPEARANCEUR, LABSPEC, PHURINE, GLUCOSEU, HGBUR, BILIRUBINUR, KETONESUR, PROTEINUR, UROBILINOGEN, NITRITE, LEUKOCYTESUR   STUDIES: CT Chest W Contrast  Result Date: 07/31/2019 CLINICAL DATA:  New diagnosis left breast cancer, ongoing chemotherapy. EXAM: CT CHEST WITH CONTRAST TECHNIQUE: Multidetector CT imaging of the chest was performed during intravenous contrast administration. CONTRAST:  71m OMNIPAQUE IOHEXOL 300 MG/ML  SOLN COMPARISON:  None. FINDINGS: Cardiovascular: Right IJ Port-A-Cath terminates in the high right atrium. Atherosclerotic calcification of the aorta. Pulmonic trunk and heart are enlarged. No pericardial effusion. Mediastinum/Nodes: No pathologically enlarged mediastinal, hilar, right axillary or internal mammary lymph nodes. A 9 mm lymph node in the left axilla is seen in association with a localization clip (2/52). Esophagus is unremarkable. Lungs/Pleura: Image quality is degraded by respiratory motion. Subpleural nodules in the apical left upper lobe measure up to 3 mm and are likely benign. Scarring in the lingula and left lower lobe. There may be additional millimetric subpleural nodules along the left hemidiaphragm, also likely benign subpleural lymph nodes. No pleural fluid. Airway is unremarkable. Upper Abdomen: Visualized portions of the liver, gallbladder and adrenal glands are unremarkable. Low-attenuation lesions in the kidneys measure up to 1.5 cm on the right and are likely cysts. Visualized portions of the kidneys, spleen, pancreas, stomach and bowel are otherwise unremarkable. Musculoskeletal: Postoperative changes of recent lumpectomy in the subareolar left breast. There are 2 additional  areas of nodular soft tissue in the inferomedial left breast, measuring up to 1.7 cm (2/83) with  associated localization clips. IMPRESSION: 1. Left axillary lymph node is at the upper limits of normal in size. Postoperative changes in the left breast with additional nodules in the inferomedial left breast. No evidence of intrathoracic metastatic disease. 2. Millimetric subpleural nodules in the left lung are likely benign. Continued attention on follow-up exams is suggested. 3. Aortic atherosclerosis (ICD10-I70.0). 4. Enlarged pulmonic trunk, indicative of pulmonary arterial hypertension. Electronically Signed   By: Lorin Picket M.D.   On: 07/31/2019 13:01   NM Bone Scan Whole Body  Result Date: 07/31/2019 CLINICAL DATA:  LEFT breast cancer, staging EXAM: NUCLEAR MEDICINE WHOLE BODY BONE SCAN TECHNIQUE: Whole body anterior and posterior images were obtained approximately 3 hours after intravenous injection of radiopharmaceutical. RADIOPHARMACEUTICALS:  19.8 mCi Technetium-59mMDP IV COMPARISON:  None Radiographic correlation: CT chest 07/31/2019 FINDINGS: Uptake at shoulders, elbows, hips, knees, feet, typically degenerative. No worrisome sites of tracer uptake are identified to suggest osseous metastatic disease. Expected urinary tract and soft tissue distribution of tracer. IMPRESSION: No scintigraphic evidence of osseous metastatic disease. Electronically Signed   By: MLavonia DanaM.D.   On: 07/31/2019 16:34   DG Chest Port 1 View  Result Date: 07/29/2019 CLINICAL DATA:  Port-A-Cath placement.  Breast cancer EXAM: PORTABLE CHEST 1 VIEW COMPARISON:  None. FINDINGS: Right jugular Port-A-Cath tip at the cavoatrial junction. No pneumothorax Asymmetric density right hilum possibly due to mass or adenopathy. There is dextroscoliosis which could be affecting evaluation of the hilum. Nevertheless, recommend further evaluation with CT chest with contrast. Negative for heart failure infiltrate or effusion. Lungs well aerated IMPRESSION: Satisfactory Port-A-Cath placement Possible right hilar mass/adenopathy.  Recommend CT chest with contrast. Electronically Signed   By: CFranchot GalloM.D.   On: 07/29/2019 13:55   DG Fluoro Guide CV Line-No Report  Result Date: 07/29/2019 Fluoroscopy was utilized by the requesting physician.  No radiographic interpretation.   MM CLIP PLACEMENT LEFT  Result Date: 07/29/2019 CLINICAL DATA:  Patient underwent MRI guided biopsy on 07/26/2019 for 2 areas in the right breast and a single area in the left breast. Patient has biopsy-proven left breast malignancy. EXAM: DIAGNOSTIC BILATERAL MAMMOGRAM POST MRI BIOPSY COMPARISON:  Previous exam(s). FINDINGS: Mammographic images were obtained following MRI guided biopsy of 2 lesions in the right breast and a single additional lesion in the left breast. The left breast biopsy marker clip, which is a dumbbell clip, lies in the medial, slightly inferior aspect of the left breast, anterior to the 2 X shaped biopsy clips and the more posterior ribbon shaped biopsy clip, the latter localizing the known breast carcinoma. On the right, the cylinder shaped biopsy clip lies inferiorly and the dumbbell shaped biopsy clip lies laterally in the expected location of the MRI lesions. IMPRESSION: Appropriate positioning of the bilateral dumbbell shaped shaped and right breast cylinder shaped biopsy marking clips at the site of biopsy in the right breast inferiorly and laterally and the left breast anterior lower inner quadrant. Final Assessment: Post Procedure Mammograms for Marker Placement Electronically Signed   By: DLajean ManesM.D.   On: 07/29/2019 09:42   MM CLIP PLACEMENT RIGHT  Result Date: 07/29/2019 CLINICAL DATA:  Patient underwent MRI guided biopsy on 07/26/2019 for 2 areas in the right breast and a single area in the left breast. Patient has biopsy-proven left breast malignancy. EXAM: DIAGNOSTIC BILATERAL MAMMOGRAM POST MRI BIOPSY COMPARISON:  Previous exam(s).  FINDINGS: Mammographic images were obtained following MRI guided biopsy of 2  lesions in the right breast and a single additional lesion in the left breast. The left breast biopsy marker clip, which is a dumbbell clip, lies in the medial, slightly inferior aspect of the left breast, anterior to the 2 X shaped biopsy clips and the more posterior ribbon shaped biopsy clip, the latter localizing the known breast carcinoma. On the right, the cylinder shaped biopsy clip lies inferiorly and the dumbbell shaped biopsy clip lies laterally in the expected location of the MRI lesions. IMPRESSION: Appropriate positioning of the bilateral dumbbell shaped shaped and right breast cylinder shaped biopsy marking clips at the site of biopsy in the right breast inferiorly and laterally and the left breast anterior lower inner quadrant. Final Assessment: Post Procedure Mammograms for Marker Placement Electronically Signed   By: Lajean Manes M.D.   On: 07/29/2019 09:42     ELIGIBLE FOR AVAILABLE RESEARCH PROTOCOL: no  ASSESSMENT: 74 y.o. [place] woman status post left breast lower outer quadrant biopsy 07/09/2019 for a clinical T2 N1, stage IIIB invasive ductal carcinoma, grade 3, triple negative, with an MIB-1 of 60%  (a) chest CT scan and bone scan 07/31/2019 showed no evidence of metastatic disease  (b) MRI biopsy of 2 additional areas in the left breast (central, 07/10/2019, and at 830 o'clock on 07/26/2019) are both benign and concordant  (c) biopsy of 2 suspicious areas in the right breast on 07/26/2019 (9:00 and lower outer quadrant) were benign and concordant   (1) neoadjuvant chemotherapy to consist of cyclophosphamide and doxorubicin in dose dense fashion x4 to start 07/30/2019 followed by weekly carboplatin and paclitaxel x12  (2) definitive surgery to follow with targeted axillary lymph node dissection  (3) adjuvant radiation to follow  (4) genetics testing 07/23/2019 through the STAT Breast cancer panel offered by [facility] found no deleterious mutations in ATM, BRCA1, BRCA2,  CDH1, CHEK2, PALB2, PTEN, STK11 and TP53.  Additional testing through the Common Hereditary Cancers Panel offered by [facility] also found no deleterious mutations in IPC, ATM, AXIN2, BARD1, BMPR1A, BRCA1, BRCA2, BRIP1, CDH1, CDK4, CDKN2A (p14ARF), CDKN2A (p16INK4a), CHEK2, CTNNA1, DICER1, EPCAM (Deletion/duplication testing only), GREM1 (promoter region deletion/duplication testing only), KIT, MEN1, MLH1, MSH2, MSH3, MSH6, MUTYH, NBN, NF1, NHTL1, PALB2, PDGFRA, PMS2, POLD1, POLE, PTEN, RAD50, RAD51C, RAD51D, RNF43, SDHB, SDHC, SDHD, SMAD4, SMARCA4. STK11, TP53, TSC1, TSC2, and VHL.  The following genes were evaluated for sequence changes only: SDHA and HOXB13 c.251G>A variant only.   (a) 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    PLAN: Averlee is doing well today.  Her CBC is improved and CMET is pending.  She will continue on her neoadjuvant chemotherapy with doxorubicin and cyclophosphamide, with cycle 3 being today.  She returns on Thursday for her Neulasta (or biosimilar).  I sent in Valtrex bid for her to take until her ulcer resolves, and then daily for the rest of chemotherapy.    We discussed healthy diet, fluid intake, and maintaining her activity level.     We will see Yajahira back in one week for labs and f/u.  She was recommended to continue with the appropriate pandemic precautions. She knows to call for any questions that may arise between now and her next appointment.  We are happy to see her sooner if needed.    Total encounter time 20 minutes.Julie Bihari, NP   08/27/2019 8:53 AM Medical Oncology and Hematology  [facility] [place], [place] [address] Tel. [phone]    Fax. [phone]    *Total Encounter Time as defined by the Centers for Medicare and Medicaid Services includes, in addition to the face-to-face time of a patient visit (documented in the note above) non-face-to-face time: obtaining  and reviewing outside history, ordering and reviewing medications, tests or procedures, care coordination (communications with other health care professionals or caregivers) and documentation in the medical record.

## 2019-08-27 NOTE — Patient Instructions (Signed)
[facility] Discharge Instructions for Patients Receiving Chemotherapy  Today you received the following chemotherapy agents: doxorubicin and cyclophosphamide.  To help prevent nausea and vomiting after your treatment, we encourage you to take your nausea medication as directed.   If you develop nausea and vomiting that is not controlled by your nausea medication, call the clinic.   BELOW ARE SYMPTOMS THAT SHOULD BE REPORTED IMMEDIATELY:  *FEVER GREATER THAN 100.5 F  *CHILLS WITH OR WITHOUT FEVER  NAUSEA AND VOMITING THAT IS NOT CONTROLLED WITH YOUR NAUSEA MEDICATION  *UNUSUAL SHORTNESS OF BREATH  *UNUSUAL BRUISING OR BLEEDING  TENDERNESS IN MOUTH AND THROAT WITH OR WITHOUT PRESENCE OF ULCERS  *URINARY PROBLEMS  *BOWEL PROBLEMS  UNUSUAL RASH Items with * indicate a potential emergency and should be followed up as soon as possible.  Feel free to call the clinic should you have any questions or concerns. The clinic phone number is [phone].  Please show the CHEMO ALERT CARD at check-in to the Emergency Department and triage nurse.   

## 2019-08-29 ENCOUNTER — Other Ambulatory Visit: Payer: Self-pay

## 2019-08-29 ENCOUNTER — Inpatient Hospital Stay: Payer: Medicare Other

## 2019-08-29 VITALS — BP 143/83 | HR 76 | Temp 98.3°F | Resp 20

## 2019-08-29 DIAGNOSIS — Z5111 Encounter for antineoplastic chemotherapy: Secondary | ICD-10-CM | POA: Diagnosis not present

## 2019-08-29 DIAGNOSIS — Z7952 Long term (current) use of systemic steroids: Secondary | ICD-10-CM | POA: Diagnosis not present

## 2019-08-29 DIAGNOSIS — M549 Dorsalgia, unspecified: Secondary | ICD-10-CM | POA: Diagnosis not present

## 2019-08-29 DIAGNOSIS — J45909 Unspecified asthma, uncomplicated: Secondary | ICD-10-CM | POA: Diagnosis not present

## 2019-08-29 DIAGNOSIS — C50512 Malignant neoplasm of lower-outer quadrant of left female breast: Secondary | ICD-10-CM | POA: Diagnosis not present

## 2019-08-29 DIAGNOSIS — Z79899 Other long term (current) drug therapy: Secondary | ICD-10-CM | POA: Diagnosis not present

## 2019-08-29 DIAGNOSIS — E785 Hyperlipidemia, unspecified: Secondary | ICD-10-CM | POA: Diagnosis not present

## 2019-08-29 DIAGNOSIS — J029 Acute pharyngitis, unspecified: Secondary | ICD-10-CM | POA: Diagnosis not present

## 2019-08-29 DIAGNOSIS — Z7982 Long term (current) use of aspirin: Secondary | ICD-10-CM | POA: Diagnosis not present

## 2019-08-29 DIAGNOSIS — Z90710 Acquired absence of both cervix and uterus: Secondary | ICD-10-CM | POA: Diagnosis not present

## 2019-08-29 DIAGNOSIS — C773 Secondary and unspecified malignant neoplasm of axilla and upper limb lymph nodes: Secondary | ICD-10-CM | POA: Diagnosis not present

## 2019-08-29 DIAGNOSIS — Z5189 Encounter for other specified aftercare: Secondary | ICD-10-CM | POA: Diagnosis not present

## 2019-08-29 DIAGNOSIS — Z171 Estrogen receptor negative status [ER-]: Secondary | ICD-10-CM | POA: Diagnosis not present

## 2019-08-29 DIAGNOSIS — E039 Hypothyroidism, unspecified: Secondary | ICD-10-CM | POA: Diagnosis not present

## 2019-08-29 DIAGNOSIS — Z803 Family history of malignant neoplasm of breast: Secondary | ICD-10-CM | POA: Diagnosis not present

## 2019-08-29 DIAGNOSIS — Z791 Long term (current) use of non-steroidal anti-inflammatories (NSAID): Secondary | ICD-10-CM | POA: Diagnosis not present

## 2019-08-29 MED ORDER — PEGFILGRASTIM-JMDB 6 MG/0.6ML ~~LOC~~ SOSY
PREFILLED_SYRINGE | SUBCUTANEOUS | Status: AC
  Filled 2019-08-29: qty 0.6

## 2019-08-29 MED ORDER — PEGFILGRASTIM-JMDB 6 MG/0.6ML ~~LOC~~ SOSY
6.0000 mg | PREFILLED_SYRINGE | Freq: Once | SUBCUTANEOUS | Status: AC
  Administered 2019-08-29: 6 mg via SUBCUTANEOUS

## 2019-08-29 NOTE — Patient Instructions (Signed)

## 2019-09-02 NOTE — Progress Notes (Signed)
[place]  Telephone:([phone]) [phone] Fax:(336) [phone]     ID: [name] DOB: [birth year]  MR#: [id number]  KGM#:[id number]  Patient Care Team: Leeroy Cha, MD as PCP - General (Internal Medicine) Rockwell Germany, RN as Oncology Nurse Navigator Mauro Kaufmann, RN as Oncology Nurse Navigator Eppie Gibson, MD as Attending Physician (Radiation Oncology) Donnie Mesa, MD as Consulting Physician (General Surgery) Magrinat, Virgie Dad, MD as Consulting Physician (Oncology) Scot Dock, NP OTHER MD:  CHIEF COMPLAINT: triple negative breast cancer  CURRENT TREATMENT: Neoadjuvant chemotherapy   INTERVAL HISTORY: Kallie returns today for follow up of her triple negative breast cancer unaccompanied.  She is doing well today.  She is receiving neoadjuvant chemotherapy first with doxorubicin and cyclophosphamide given on day 1 of a 14 day cycle with Neulasta support on day 3.  She is currently cycle 3 day 8.     REVIEW OF SYSTEMS: Wynonna is doing well today.  She is neutropenic again today.  She started on Cipro yesterday, and has also been taking Valtrex.  She has no mucositis.  She is feeling nauseated.  She thinks this may be related to something that she is taking.  She has not had any vomiting.  She is very fatigued.  She is following appropriate pandemic precautions.  She is taking her anti nausea medication regularly.  She is drinking sprite and water and is keeping them down.  She is eating better, however the food doesn't taste well.  She denies fever, chills, chest pain, palpitations, cough, dizziness, headaches, vision issues, bowel/bladder changes, or any other concerns.  A detailed ROS was otherwise non contributory.     HISTORY OF CURRENT ILLNESS: From the original intake note:  Tinika Bucknam had routine screening mammography on 06/21/2019 showing a possible abnormality in the left breast. She underwent left diagnostic mammography with  tomography and left breast ultrasonography at [facility] on 07/01/2019 showing: breast density category C; either 2 adjacent masses or 1 complex dumbbell-shaped mass containing calcifications in the left breast at 6 o'clock, the largest of which measures 2.1 cm; chest wall invasion not excluded; 1-2 mm group of calcifications just anterior to the mass(es); single abnormal lymph node in the left axilla; vascular calcifications in anterior left breast.  Accordingly on 07/09/2019 she proceeded to biopsy of the left breast areas in question. The pathology from this procedure ([id number]) showed:  1. Left Breast, 5:30, posterior  - invasive ductal carcinoma with necrosis, grade 3  - Prognostic indicators significant for: estrogen receptor, 0% negative and progesterone receptor, 0% negative. Proliferation marker Ki67 at 60%. HER2 negative by immunohistochemistry (0). 2. Left Breast, 5:30, posterior  - invasive ductal carcinoma with necrosis, grade 3  - ductal carcinoma in situ, intermediate grade 3. Left Axilla, lymph node (1)  - metastatic carcinoma involving a lymph node  And additional biopsy of the anterior vascular calcifications in the left breast was performed on 07/10/2019. Pathology [id number]) showed: fibrocystic changes with calcifications; no malignancy identified.  This was felt to be concordant.  The patient's subsequent history is as detailed below.   PAST MEDICAL HISTORY: Past Medical History:  Diagnosis Date  . Asthma    no problems now  . Family history of basal cell carcinoma   . Family history of breast cancer   . Family history of colon cancer   . Family history of kidney cancer   . Family history of leukemia   . Family history of peritoneal cancer   .  GERD (gastroesophageal reflux disease)   . H/O hiatal hernia   . Hyperlipidemia   . Hypothyroidism   . IBS (irritable bowel syndrome)   . lt breast ca dx'd 07/10/19    PAST SURGICAL HISTORY: Past Surgical History:    Procedure Laterality Date  . ABDOMINAL HYSTERECTOMY     age 42  . BREAST SURGERY    . DILATION AND CURETTAGE OF UTERUS     2 miscarriages age 89 & 55  . FLEXIBLE SIGMOIDOSCOPY N/A 10/22/2013   Procedure: FLEXIBLE SIGMOIDOSCOPY;  Surgeon: Garlan Fair, MD;  Location: [facility];  Service: Endoscopy;  Laterality: N/A;  . KNEE ARTHROSCOPY Left   . PORTACATH PLACEMENT Right 07/29/2019   Procedure: INSERTION PORT-A-CATH WITH ULTRASOUND;  Surgeon: Donnie Mesa, MD;  Location: [facility];  Service: General;  Laterality: Right;  . TONSILLECTOMY     age 71    FAMILY HISTORY: Family History  Problem Relation Age of Onset  . Breast cancer Maternal Aunt        dx. in her 29s  . Cancer Mother 63       peritoneal cancer, death certificate lists ovarian cancer  . Colon cancer Paternal Uncle        dx. late 31s or early 55s  . Basal cell carcinoma Sister 57       a second diagnosed in her 45s  . Kidney cancer Maternal Grandmother 29  . Diabetes Paternal Grandmother   . Heart Problems Paternal Grandmother   . Leukemia Paternal Uncle        dx. early 48s   Patient's father is currently living at age 53 as of 07/24/2019. Patient's mother died from ovarian cancer at age 21. She was diagnosed at age 54. The patient reports breast cancer in a maternal aunt, diagnosed in her 23's. The patient also noted colon cancer in a paternal uncle and kidney cancer in her maternal grandmother.  She has 1 sister.   GYNECOLOGIC HISTORY:  No LMP recorded. Patient has had a hysterectomy. Menarche: 38.74 years old Age at first live birth: 74 years old [place] P 2 LMP age 32 (with hysterectomy) Contraceptive: never used HRT used for 10-12 years  Hysterectomy? Yes, age 78 BSO? 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  Husband [name] is a retired Psychologist, prison and probation services. She lives at home with husband Butch. She has two  children from a prior marriage. Son [name], age 15, works as a Games developer in [place], [place]. Son [name], age 41, works as a Programme researcher, broadcasting/film/video in [place], [place]. The patient has 4 grandchildren and 4 great-grandchildren. She attends [employer].    ADVANCED DIRECTIVES: In the absence of any documentation to the contrary, the patient's spouse is their HCPOA.    HEALTH MAINTENANCE: Social History   Tobacco Use  . Smoking status: Never Smoker  . Smokeless tobacco: Never Used  Substance Use Topics  . Alcohol use: No  . Drug use: No     Colonoscopy: 09/2013, with CT virtual 11/2013  PAP: none on file, s/p hysterectomy  Bone density: 04/2001, -0.5   Allergies  Allergen Reactions  . Sulfa Antibiotics Rash  . Penicillins Hives    Current Outpatient Medications  Medication Sig Dispense Refill  . aspirin EC 81 MG tablet Take 81 mg by mouth daily.    Marland Kitchen atorvastatin (LIPITOR) 20 MG tablet Take 20 mg by mouth daily.    Marland Kitchen  Cholecalciferol (VITAMIN D) 2000 UNITS tablet Take 2,000 Units by mouth daily.    . ciprofloxacin (CIPRO) 500 MG tablet Take 1 tablet (500 mg total) by mouth 2 (two) times daily. For 5 days starting 6 days after chemotherapy 40 tablet 1  . co-enzyme Q-10 30 MG capsule Take 20 mg by mouth 3 (three) times daily.    Marland Kitchen dexamethasone (DECADRON) 4 MG tablet Take 2 tablets by mouth daily starting the day after Carboplatin and Cytoxan x 3 days. Take with food. 30 tablet 1  . esomeprazole (NEXIUM) 20 MG capsule Take 20 mg by mouth daily at 12 noon.    . famotidine (PEPCID) 20 MG tablet Take 40 mg by mouth 2 (two) times daily.    Marland Kitchen FIBER PO Take 1 tablet by mouth daily.    . fluconazole (DIFLUCAN) 200 MG tablet Take 1 tablet (200 mg total) by mouth daily. 30 tablet 0  . HYDROcodone-acetaminophen (NORCO/VICODIN) 5-325 MG tablet Take 1 tablet by mouth every 6 (six) hours as needed for moderate pain. 15 tablet 0  . levothyroxine (SYNTHROID) 50 MCG tablet Take 50 mcg by mouth  daily.    Marland Kitchen lidocaine-prilocaine (EMLA) cream Apply 1 application topically as needed. 30 g 0  . loratadine (CLARITIN) 10 MG tablet Take 1 tablet (10 mg total) by mouth daily. 60 tablet 1  . LORazepam (ATIVAN) 0.5 MG tablet Take 1 tablet (0.5 mg total) by mouth at bedtime as needed (Nausea or vomiting). 30 tablet 0  . Multiple Vitamin (MULTIVITAMIN WITH MINERALS) TABS tablet Take 1 tablet by mouth daily.    . nabumetone (RELAFEN) 500 MG tablet Take 500 mg by mouth 2 (two) times daily as needed.    . Omega-3 Fatty Acids (FISH OIL) 1200 MG CAPS Take 1,200 mg by mouth daily.    . ondansetron (ZOFRAN) 8 MG tablet Take 1 tablet (8 mg total) by mouth every 8 (eight) hours as needed for nausea or vomiting. May start no earlier than day 3 of a chemotherapy cycle. 20 tablet 4  . prochlorperazine (COMPAZINE) 10 MG tablet Take 1 tablet (10 mg total) by mouth every 6 (six) hours as needed (Nausea or vomiting). 30 tablet 1  . valACYclovir (VALTREX) 500 MG tablet Take 1 tablet (500 mg total) by mouth 2 (two) times daily. 180 tablet 0   No current facility-administered medications for this visit.    OBJECTIVE: Middle-aged white woman in no acute distress  Vitals:   09/03/19 0946  BP: (!) 142/40  Pulse: 91  Resp: 18  Temp: 98 F (36.7 C)  SpO2: 99%     Body mass index is 35.86 kg/m.   Wt Readings from Last 3 Encounters:  09/03/19 183 lb 9.6 oz (83.3 kg)  08/27/19 184 lb 8 oz (83.7 kg)  08/20/19 182 lb 12.8 oz (82.9 kg)      ECOG FS:1 - Symptomatic but completely ambulatory  GENERAL: Patient is a well appearing female in no acute distress HEENT:  Sclerae anicteric. Ritta Slot has resolved, two ulcerations on upper anterior left palate Neck is supple.  NODES:  No cervical, supraclavicular, or axillary lymphadenopathy palpated.  BREAST EXAM:  Small 1-2 cm nodule in left lower breast, no sign of progression noted   LUNGS:  Clear to auscultation bilaterally.  No wheezes or rhonchi. HEART:  Regular rate  and rhythm. No murmur appreciated. ABDOMEN:  Soft, nontender.  Positive, normoactive bowel sounds. No organomegaly palpated. MSK:  No focal spinal tenderness to palpation. Full range of  motion bilaterally in the upper extremities. EXTREMITIES:  No peripheral edema.   SKIN:  Clear with no obvious rashes or skin changes. No nail dyscrasia. NEURO:  Nonfocal. Well oriented.  Appropriate affect.     LAB RESULTS:  CMP     Component Value Date/Time   NA 142 09/03/2019 0905   K 4.1 09/03/2019 0905   CL 107 09/03/2019 0905   CO2 27 09/03/2019 0905   GLUCOSE 122 (H) 09/03/2019 0905   BUN 17 09/03/2019 0905   CREATININE 0.70 09/03/2019 0905   CREATININE 0.94 07/17/2019 0823   CALCIUM 8.6 (L) 09/03/2019 0905   PROT 5.9 (L) 09/03/2019 0905   ALBUMIN 3.3 (L) 09/03/2019 0905   AST 10 (L) 09/03/2019 0905   AST 21 07/17/2019 0823   ALT 10 09/03/2019 0905   ALT 18 07/17/2019 0823   ALKPHOS 83 09/03/2019 0905   BILITOT 0.4 09/03/2019 0905   BILITOT 0.4 07/17/2019 0823   GFRNONAA >60 09/03/2019 0905   GFRNONAA >60 07/17/2019 0823   GFRAA >60 09/03/2019 0905   GFRAA >60 07/17/2019 0823    No results found for: TOTALPROTELP, ALBUMINELP, A1GS, A2GS, BETS, BETA2SER, GAMS, MSPIKE, SPEI  Lab Results  Component Value Date   WBC 0.8 (LL) 09/03/2019   NEUTROABS 0.5 (L) 09/03/2019   HGB 9.8 (L) 09/03/2019   HCT 30.0 (L) 09/03/2019   MCV 95.8 09/03/2019   PLT 146 (L) 09/03/2019    No results found for: LABCA2  No components found for: XVQMGQ676  No results for input(s): INR in the last 168 hours.  No results found for: LABCA2  No results found for: PPJ093  No results found for: OIZ124  No results found for: PYK998  No results found for: CA2729  No components found for: HGQUANT  No results found for: CEA1 / No results found for: CEA1   No results found for: AFPTUMOR  No results found for: CHROMOGRNA  No results found for: KPAFRELGTCHN, LAMBDASER, KAPLAMBRATIO (kappa/lambda  light chains)  No results found for: HGBA, HGBA2QUANT, HGBFQUANT, HGBSQUAN (Hemoglobinopathy evaluation)   No results found for: LDH  No results found for: IRON, TIBC, IRONPCTSAT (Iron and TIBC)  No results found for: FERRITIN  Urinalysis No results found for: COLORURINE, APPEARANCEUR, LABSPEC, PHURINE, GLUCOSEU, HGBUR, BILIRUBINUR, KETONESUR, PROTEINUR, UROBILINOGEN, NITRITE, LEUKOCYTESUR   STUDIES: No results found.   ELIGIBLE FOR AVAILABLE RESEARCH PROTOCOL: no  ASSESSMENT: 74 y.o. [place] woman status post left breast lower outer quadrant biopsy 07/09/2019 for a clinical T2 N1, stage IIIB invasive ductal carcinoma, grade 3, triple negative, with an MIB-1 of 60%  (a) chest CT scan and bone scan 07/31/2019 showed no evidence of metastatic disease  (b) MRI biopsy of 2 additional areas in the left breast (central, 07/10/2019, and at 830 o'clock on 07/26/2019) are both benign and concordant  (c) biopsy of 2 suspicious areas in the right breast on 07/26/2019 (9:00 and lower outer quadrant) were benign and concordant   (1) neoadjuvant chemotherapy to consist of cyclophosphamide and doxorubicin in dose dense fashion x4 to start 07/30/2019 followed by weekly carboplatin and paclitaxel x12  (2) definitive surgery to follow with targeted axillary lymph node dissection  (3) adjuvant radiation to follow  (4) genetics testing 07/23/2019 through the STAT Breast cancer panel offered by [facility] found no deleterious mutations in ATM, BRCA1, BRCA2, CDH1, CHEK2, PALB2, PTEN, STK11 and TP53.  Additional testing through the Common Hereditary Cancers Panel offered by [facility] also found no deleterious mutations in IPC, ATM, AXIN2, BARD1,  BMPR1A, BRCA1, BRCA2, BRIP1, CDH1, CDK4, CDKN2A (p14ARF), CDKN2A (p16INK4a), CHEK2, CTNNA1, DICER1, EPCAM (Deletion/duplication testing only), GREM1 (promoter region deletion/duplication testing only), KIT, MEN1, MLH1, MSH2, MSH3, MSH6, MUTYH, NBN, NF1, NHTL1, PALB2,  PDGFRA, PMS2, POLD1, POLE, PTEN, RAD50, RAD51C, RAD51D, RNF43, SDHB, SDHC, SDHD, SMAD4, SMARCA4. STK11, TP53, TSC1, TSC2, and VHL.  The following genes were evaluated for sequence changes only: SDHA and HOXB13 c.251G>A variant only.   (a) 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    PLAN: Rosalia is doing moderately well since receiving the doxorubicin and cyclophosphamide.  I recommended she continue the cipro since she is neutropenic, and to follow close neutropenic precautions.  She verbalizes understanding.  Since Janisa has had increased nausea, she wants to take fewer medications.  She will stop Valtrex for the time being.  The Compazine has been helping her nausea, so she will continue this.   She was recommended to increase her fluid intake, and to eat more bland foods such as bananas to help combat the nausea.  We will see her back next week for labs, follow up, and her final cycle of neoadjuvant doxorubicin and cyclopshosphamide.  She was recommended to continue with the appropriate pandemic precautions. She knows to call for any questions that may arise between now and her next appointment.  We are happy to see her sooner if needed.    Total encounter time 20 minutes.Wilber Bihari, NP   09/03/2019 10:05 AM Medical Oncology and Hematology [facility], [place] [address] Tel. [phone]    Fax. [phone]    *Total Encounter Time as defined by the Centers for Medicare and Medicaid Services includes, in addition to the face-to-face time of a patient visit (documented in the note above) non-face-to-face time: obtaining and reviewing outside history, ordering and reviewing medications, tests or procedures, care coordination (communications with other health care professionals or caregivers) and documentation in the medical record.

## 2019-09-03 ENCOUNTER — Inpatient Hospital Stay (HOSPITAL_BASED_OUTPATIENT_CLINIC_OR_DEPARTMENT_OTHER): Payer: Medicare Other | Admitting: Adult Health

## 2019-09-03 ENCOUNTER — Encounter: Payer: Self-pay | Admitting: Adult Health

## 2019-09-03 ENCOUNTER — Inpatient Hospital Stay: Payer: Medicare Other | Attending: Oncology

## 2019-09-03 ENCOUNTER — Other Ambulatory Visit: Payer: Self-pay

## 2019-09-03 ENCOUNTER — Inpatient Hospital Stay: Payer: Medicare Other

## 2019-09-03 VITALS — BP 142/40 | HR 91 | Temp 98.0°F | Resp 18 | Ht 60.0 in | Wt 183.6 lb

## 2019-09-03 DIAGNOSIS — Z791 Long term (current) use of non-steroidal anti-inflammatories (NSAID): Secondary | ICD-10-CM | POA: Diagnosis not present

## 2019-09-03 DIAGNOSIS — Z7952 Long term (current) use of systemic steroids: Secondary | ICD-10-CM | POA: Diagnosis not present

## 2019-09-03 DIAGNOSIS — Z8349 Family history of other endocrine, nutritional and metabolic diseases: Secondary | ICD-10-CM | POA: Diagnosis not present

## 2019-09-03 DIAGNOSIS — Z7982 Long term (current) use of aspirin: Secondary | ICD-10-CM | POA: Diagnosis not present

## 2019-09-03 DIAGNOSIS — Z8051 Family history of malignant neoplasm of kidney: Secondary | ICD-10-CM | POA: Insufficient documentation

## 2019-09-03 DIAGNOSIS — Z8249 Family history of ischemic heart disease and other diseases of the circulatory system: Secondary | ICD-10-CM | POA: Insufficient documentation

## 2019-09-03 DIAGNOSIS — D701 Agranulocytosis secondary to cancer chemotherapy: Secondary | ICD-10-CM | POA: Insufficient documentation

## 2019-09-03 DIAGNOSIS — Z90710 Acquired absence of both cervix and uterus: Secondary | ICD-10-CM | POA: Insufficient documentation

## 2019-09-03 DIAGNOSIS — K589 Irritable bowel syndrome without diarrhea: Secondary | ICD-10-CM | POA: Insufficient documentation

## 2019-09-03 DIAGNOSIS — C50512 Malignant neoplasm of lower-outer quadrant of left female breast: Secondary | ICD-10-CM

## 2019-09-03 DIAGNOSIS — Z803 Family history of malignant neoplasm of breast: Secondary | ICD-10-CM | POA: Insufficient documentation

## 2019-09-03 DIAGNOSIS — Z806 Family history of leukemia: Secondary | ICD-10-CM | POA: Insufficient documentation

## 2019-09-03 DIAGNOSIS — Z8041 Family history of malignant neoplasm of ovary: Secondary | ICD-10-CM | POA: Insufficient documentation

## 2019-09-03 DIAGNOSIS — Z171 Estrogen receptor negative status [ER-]: Secondary | ICD-10-CM | POA: Diagnosis not present

## 2019-09-03 DIAGNOSIS — Z79899 Other long term (current) drug therapy: Secondary | ICD-10-CM | POA: Diagnosis not present

## 2019-09-03 DIAGNOSIS — Z833 Family history of diabetes mellitus: Secondary | ICD-10-CM | POA: Insufficient documentation

## 2019-09-03 DIAGNOSIS — E785 Hyperlipidemia, unspecified: Secondary | ICD-10-CM | POA: Diagnosis not present

## 2019-09-03 DIAGNOSIS — E039 Hypothyroidism, unspecified: Secondary | ICD-10-CM | POA: Insufficient documentation

## 2019-09-03 DIAGNOSIS — T451X5D Adverse effect of antineoplastic and immunosuppressive drugs, subsequent encounter: Secondary | ICD-10-CM | POA: Insufficient documentation

## 2019-09-03 DIAGNOSIS — Z5189 Encounter for other specified aftercare: Secondary | ICD-10-CM | POA: Insufficient documentation

## 2019-09-03 DIAGNOSIS — Z800 Family history of malignant neoplasm of digestive organs: Secondary | ICD-10-CM | POA: Diagnosis not present

## 2019-09-03 DIAGNOSIS — J45909 Unspecified asthma, uncomplicated: Secondary | ICD-10-CM | POA: Diagnosis not present

## 2019-09-03 DIAGNOSIS — Z5111 Encounter for antineoplastic chemotherapy: Secondary | ICD-10-CM | POA: Insufficient documentation

## 2019-09-03 DIAGNOSIS — C773 Secondary and unspecified malignant neoplasm of axilla and upper limb lymph nodes: Secondary | ICD-10-CM | POA: Diagnosis not present

## 2019-09-03 LAB — COMPREHENSIVE METABOLIC PANEL
ALT: 10 U/L (ref 0–44)
AST: 10 U/L — ABNORMAL LOW (ref 15–41)
Albumin: 3.3 g/dL — ABNORMAL LOW (ref 3.5–5.0)
Alkaline Phosphatase: 83 U/L (ref 38–126)
Anion gap: 8 (ref 5–15)
BUN: 17 mg/dL (ref 8–23)
CO2: 27 mmol/L (ref 22–32)
Calcium: 8.6 mg/dL — ABNORMAL LOW (ref 8.9–10.3)
Chloride: 107 mmol/L (ref 98–111)
Creatinine, Ser: 0.7 mg/dL (ref 0.44–1.00)
GFR calc Af Amer: >60 mL/min (ref >60)
GFR calc non Af Amer: >60 mL/min (ref >60)
Glucose, Bld: 122 mg/dL — ABNORMAL HIGH (ref 70–99)
Potassium: 4.1 mmol/L (ref 3.5–5.1)
Sodium: 142 mmol/L (ref 135–145)
Total Bilirubin: 0.4 mg/dL (ref 0.3–1.2)
Total Protein: 5.9 g/dL — ABNORMAL LOW (ref 6.5–8.1)

## 2019-09-03 LAB — CBC WITH DIFFERENTIAL/PLATELET
Abs Immature Granulocytes: 0 10*3/uL (ref 0.00–0.07)
Band Neutrophils: 23 %
Basophils Absolute: 0 10*3/uL (ref 0.0–0.1)
Basophils Relative: 5 %
Eosinophils Absolute: 0 10*3/uL (ref 0.0–0.5)
Eosinophils Relative: 1 %
HCT: 30 % — ABNORMAL LOW (ref 36.0–46.0)
Hemoglobin: 9.8 g/dL — ABNORMAL LOW (ref 12.0–15.0)
Lymphocytes Relative: 12 %
Lymphs Abs: 0.1 10*3/uL — ABNORMAL LOW (ref 0.7–4.0)
MCH: 31.3 pg (ref 26.0–34.0)
MCHC: 32.7 g/dL (ref 30.0–36.0)
MCV: 95.8 fL (ref 80.0–100.0)
Metamyelocytes Relative: 1 %
Monocytes Absolute: 0.1 10*3/uL (ref 0.1–1.0)
Monocytes Relative: 13 %
Neutro Abs: 0.5 10*3/uL — ABNORMAL LOW (ref 1.7–7.7)
Neutrophils Relative %: 45 %
Platelets: 146 10*3/uL — ABNORMAL LOW (ref 150–400)
RBC: 3.13 MIL/uL — ABNORMAL LOW (ref 3.87–5.11)
RDW: 13.5 % (ref 11.5–15.5)
WBC: 0.8 10*3/uL — CL (ref 4.0–10.5)
nRBC: 0 % (ref 0.0–0.2)

## 2019-09-10 ENCOUNTER — Inpatient Hospital Stay: Payer: Medicare Other

## 2019-09-10 ENCOUNTER — Encounter: Payer: Self-pay | Admitting: *Deleted

## 2019-09-10 ENCOUNTER — Encounter: Payer: Self-pay | Admitting: Adult Health

## 2019-09-10 ENCOUNTER — Other Ambulatory Visit: Payer: Self-pay

## 2019-09-10 ENCOUNTER — Inpatient Hospital Stay (HOSPITAL_BASED_OUTPATIENT_CLINIC_OR_DEPARTMENT_OTHER): Payer: Medicare Other | Admitting: Adult Health

## 2019-09-10 VITALS — BP 139/71 | HR 72 | Temp 97.8°F | Resp 18 | Ht 60.0 in | Wt 187.2 lb

## 2019-09-10 DIAGNOSIS — C50512 Malignant neoplasm of lower-outer quadrant of left female breast: Secondary | ICD-10-CM

## 2019-09-10 DIAGNOSIS — Z7952 Long term (current) use of systemic steroids: Secondary | ICD-10-CM | POA: Diagnosis not present

## 2019-09-10 DIAGNOSIS — Z171 Estrogen receptor negative status [ER-]: Secondary | ICD-10-CM

## 2019-09-10 DIAGNOSIS — C773 Secondary and unspecified malignant neoplasm of axilla and upper limb lymph nodes: Secondary | ICD-10-CM | POA: Diagnosis not present

## 2019-09-10 DIAGNOSIS — Z791 Long term (current) use of non-steroidal anti-inflammatories (NSAID): Secondary | ICD-10-CM | POA: Diagnosis not present

## 2019-09-10 DIAGNOSIS — Z7982 Long term (current) use of aspirin: Secondary | ICD-10-CM | POA: Diagnosis not present

## 2019-09-10 DIAGNOSIS — D701 Agranulocytosis secondary to cancer chemotherapy: Secondary | ICD-10-CM | POA: Diagnosis not present

## 2019-09-10 DIAGNOSIS — T451X5D Adverse effect of antineoplastic and immunosuppressive drugs, subsequent encounter: Secondary | ICD-10-CM | POA: Diagnosis not present

## 2019-09-10 DIAGNOSIS — Z90710 Acquired absence of both cervix and uterus: Secondary | ICD-10-CM | POA: Diagnosis not present

## 2019-09-10 DIAGNOSIS — Z5111 Encounter for antineoplastic chemotherapy: Secondary | ICD-10-CM | POA: Diagnosis not present

## 2019-09-10 DIAGNOSIS — Z79899 Other long term (current) drug therapy: Secondary | ICD-10-CM | POA: Diagnosis not present

## 2019-09-10 LAB — COMPREHENSIVE METABOLIC PANEL
ALT: 9 U/L (ref 0–44)
AST: 13 U/L — ABNORMAL LOW (ref 15–41)
Albumin: 3.3 g/dL — ABNORMAL LOW (ref 3.5–5.0)
Alkaline Phosphatase: 86 U/L (ref 38–126)
Anion gap: 6 (ref 5–15)
BUN: 8 mg/dL (ref 8–23)
CO2: 27 mmol/L (ref 22–32)
Calcium: 8.4 mg/dL — ABNORMAL LOW (ref 8.9–10.3)
Chloride: 111 mmol/L (ref 98–111)
Creatinine, Ser: 0.68 mg/dL (ref 0.44–1.00)
GFR calc Af Amer: >60 mL/min (ref >60)
GFR calc non Af Amer: >60 mL/min (ref >60)
Glucose, Bld: 106 mg/dL — ABNORMAL HIGH (ref 70–99)
Potassium: 4.1 mmol/L (ref 3.5–5.1)
Sodium: 144 mmol/L (ref 135–145)
Total Bilirubin: <0.2 mg/dL — ABNORMAL LOW (ref 0.3–1.2)
Total Protein: 5.9 g/dL — ABNORMAL LOW (ref 6.5–8.1)

## 2019-09-10 LAB — CBC WITH DIFFERENTIAL/PLATELET
Abs Immature Granulocytes: 0.11 10*3/uL — ABNORMAL HIGH (ref 0.00–0.07)
Basophils Absolute: 0 10*3/uL (ref 0.0–0.1)
Basophils Relative: 0 %
Eosinophils Absolute: 0 10*3/uL (ref 0.0–0.5)
Eosinophils Relative: 0 %
HCT: 29.5 % — ABNORMAL LOW (ref 36.0–46.0)
Hemoglobin: 9.3 g/dL — ABNORMAL LOW (ref 12.0–15.0)
Immature Granulocytes: 2 %
Lymphocytes Relative: 5 %
Lymphs Abs: 0.2 10*3/uL — ABNORMAL LOW (ref 0.7–4.0)
MCH: 30.5 pg (ref 26.0–34.0)
MCHC: 31.5 g/dL (ref 30.0–36.0)
MCV: 96.7 fL (ref 80.0–100.0)
Monocytes Absolute: 0.5 10*3/uL (ref 0.1–1.0)
Monocytes Relative: 10 %
Neutro Abs: 4 10*3/uL (ref 1.7–7.7)
Neutrophils Relative %: 83 %
Platelets: 170 10*3/uL (ref 150–400)
RBC: 3.05 MIL/uL — ABNORMAL LOW (ref 3.87–5.11)
RDW: 16.1 % — ABNORMAL HIGH (ref 11.5–15.5)
WBC: 4.9 10*3/uL (ref 4.0–10.5)
nRBC: 0.8 % — ABNORMAL HIGH (ref 0.0–0.2)

## 2019-09-10 MED ORDER — DEXAMETHASONE SODIUM PHOSPHATE 10 MG/ML IJ SOLN
10.0000 mg | Freq: Once | INTRAMUSCULAR | Status: AC
  Administered 2019-09-10: 13:00:00 10 mg via INTRAVENOUS

## 2019-09-10 MED ORDER — SODIUM CHLORIDE 0.9 % IV SOLN
Freq: Once | INTRAVENOUS | Status: AC
  Filled 2019-09-10: qty 250

## 2019-09-10 MED ORDER — SODIUM CHLORIDE 0.9% FLUSH
10.0000 mL | INTRAVENOUS | Status: DC | PRN
  Administered 2019-09-10: 10 mL
  Filled 2019-09-10: qty 10

## 2019-09-10 MED ORDER — SODIUM CHLORIDE 0.9 % IV SOLN
150.0000 mg | Freq: Once | INTRAVENOUS | Status: AC
  Administered 2019-09-10: 150 mg via INTRAVENOUS
  Filled 2019-09-10: qty 150

## 2019-09-10 MED ORDER — SODIUM CHLORIDE 0.9 % IV SOLN
510.0000 mg/m2 | Freq: Once | INTRAVENOUS | Status: AC
  Administered 2019-09-10: 15:00:00 960 mg via INTRAVENOUS
  Filled 2019-09-10: qty 48

## 2019-09-10 MED ORDER — PALONOSETRON HCL INJECTION 0.25 MG/5ML
0.2500 mg | Freq: Once | INTRAVENOUS | Status: AC
  Administered 2019-09-10: 0.25 mg via INTRAVENOUS

## 2019-09-10 MED ORDER — DOXORUBICIN HCL CHEMO IV INJECTION 2 MG/ML
51.0000 mg/m2 | Freq: Once | INTRAVENOUS | Status: AC
  Administered 2019-09-10: 96 mg via INTRAVENOUS
  Filled 2019-09-10: qty 48

## 2019-09-10 MED ORDER — HEPARIN SOD (PORK) LOCK FLUSH 100 UNIT/ML IV SOLN
500.0000 [IU] | Freq: Once | INTRAVENOUS | Status: AC | PRN
  Administered 2019-09-10: 15:00:00 500 [IU]
  Filled 2019-09-10: qty 5

## 2019-09-10 MED ORDER — DEXAMETHASONE SODIUM PHOSPHATE 10 MG/ML IJ SOLN
INTRAMUSCULAR | Status: AC
  Filled 2019-09-10: qty 1

## 2019-09-10 MED ORDER — PALONOSETRON HCL INJECTION 0.25 MG/5ML
INTRAVENOUS | Status: AC
  Filled 2019-09-10: qty 5

## 2019-09-10 NOTE — Progress Notes (Signed)
[place] to make appt for ultrasound.  Pt is aware of time and date of 3/24 @9am .

## 2019-09-10 NOTE — Patient Instructions (Signed)
[facility] Discharge Instructions for Patients Receiving Chemotherapy  Today you received the following chemotherapy agents: doxorubicin and cyclophosphamide.  To help prevent nausea and vomiting after your treatment, we encourage you to take your nausea medication as directed.   If you develop nausea and vomiting that is not controlled by your nausea medication, call the clinic.   BELOW ARE SYMPTOMS THAT SHOULD BE REPORTED IMMEDIATELY:  *FEVER GREATER THAN 100.5 F  *CHILLS WITH OR WITHOUT FEVER  NAUSEA AND VOMITING THAT IS NOT CONTROLLED WITH YOUR NAUSEA MEDICATION  *UNUSUAL SHORTNESS OF BREATH  *UNUSUAL BRUISING OR BLEEDING  TENDERNESS IN MOUTH AND THROAT WITH OR WITHOUT PRESENCE OF ULCERS  *URINARY PROBLEMS  *BOWEL PROBLEMS  UNUSUAL RASH Items with * indicate a potential emergency and should be followed up as soon as possible.  Feel free to call the clinic should you have any questions or concerns. The clinic phone number is [phone].  Please show the CHEMO ALERT CARD at check-in to the Emergency Department and triage nurse.   

## 2019-09-10 NOTE — Progress Notes (Signed)
[facility]  Telephone:([phone]) [phone] Fax:(336) [phone]     ID: Julie Thompson DOB: 05-16-1946  MR#: [id number]  TDD#:[id number]  Patient Care Team: Leeroy Cha, MD as PCP - General (Internal Medicine) Rockwell Germany, RN as Oncology Nurse Navigator Mauro Kaufmann, RN as Oncology Nurse Navigator Eppie Gibson, MD as Attending Physician (Radiation Oncology) Donnie Mesa, MD as Consulting Physician (General Surgery) Magrinat, Virgie Dad, MD as Consulting Physician (Oncology) Scot Dock, NP OTHER MD:  CHIEF COMPLAINT: triple negative breast cancer  CURRENT TREATMENT: Neoadjuvant chemotherapy   INTERVAL HISTORY: Julie Thompson returns today for follow up of her triple negative breast cancer unaccompanied.  She is doing well today.  She is receiving neoadjuvant chemotherapy first with doxorubicin and cyclophosphamide given on day 1 of a 14 day cycle with Neulasta support on day 3.  She is currently cycle 4 day 1.     REVIEW OF SYSTEMS: Julie Thompson is feeling well today.  She was experiencing nausea last week and stopped her valtrex. The nausea is better.  Her WBC has rebounded, and she is looking forward to getting over with her first round of neoadjuvant chemotherapy.  She still has her left breast mass, and feels like it is somewhat smaller, but isn't quite sure.    Julie Thompson denies any fever, chills, chest pain, palpitations, cough, shortness of breath, headaches, vision issues, nausea, vomiting, bowel/bladder changes, or any other concerns.  She continues to do housework and remain active, she notes that it just takes her longer to get things completed.  A detailed ROS Was otherwise non contributory.   HISTORY OF CURRENT ILLNESS: From the original intake note:  Julie Thompson had routine screening mammography on 06/21/2019 showing a possible abnormality in the left breast. She underwent left diagnostic mammography with tomography and left breast ultrasonography  at [facility] on 07/01/2019 showing: breast density category C; either 2 adjacent masses or 1 complex dumbbell-shaped mass containing calcifications in the left breast at 6 o'clock, the largest of which measures 2.1 cm; chest wall invasion not excluded; 1-2 mm group of calcifications just anterior to the mass(es); single abnormal lymph node in the left axilla; vascular calcifications in anterior left breast.  Accordingly on 07/09/2019 she proceeded to biopsy of the left breast areas in question. The pathology from this procedure ([id number]) showed:  1. Left Breast, 5:30, posterior  - invasive ductal carcinoma with necrosis, grade 3  - Prognostic indicators significant for: estrogen receptor, 0% negative and progesterone receptor, 0% negative. Proliferation marker Ki67 at 60%. HER2 negative by immunohistochemistry (0). 2. Left Breast, 5:30, posterior  - invasive ductal carcinoma with necrosis, grade 3  - ductal carcinoma in situ, intermediate grade 3. Left Axilla, lymph node (1)  - metastatic carcinoma involving a lymph node  And additional biopsy of the anterior vascular calcifications in the left breast was performed on 07/10/2019. Pathology [id number]) showed: fibrocystic changes with calcifications; no malignancy identified.  This was felt to be concordant.  The patient's subsequent history is as detailed below.   PAST MEDICAL HISTORY: Past Medical History:  Diagnosis Date  . Asthma    no problems now  . Family history of basal cell carcinoma   . Family history of breast cancer   . Family history of colon cancer   . Family history of kidney cancer   . Family history of leukemia   . Family history of peritoneal cancer   . GERD (gastroesophageal reflux disease)   . H/O hiatal hernia   .  Hyperlipidemia   . Hypothyroidism   . IBS (irritable bowel syndrome)   . lt breast ca dx'd 07/10/19    PAST SURGICAL HISTORY: Past Surgical History:  Procedure Laterality Date  . ABDOMINAL  HYSTERECTOMY     age 57  . BREAST SURGERY    . DILATION AND CURETTAGE OF UTERUS     2 miscarriages age 74 & 66  . FLEXIBLE SIGMOIDOSCOPY N/A 10/22/2013   Procedure: FLEXIBLE SIGMOIDOSCOPY;  Surgeon: Garlan Fair, MD;  Location: [facility];  Service: Endoscopy;  Laterality: N/A;  . KNEE ARTHROSCOPY Left   . PORTACATH PLACEMENT Right 07/29/2019   Procedure: INSERTION PORT-A-CATH WITH ULTRASOUND;  Surgeon: Donnie Mesa, MD;  Location: [facility];  Service: General;  Laterality: Right;  . TONSILLECTOMY     age 43    FAMILY HISTORY: Family History  Problem Relation Age of Onset  . Breast cancer Maternal Aunt        dx. in her 57s  . Cancer Mother 32       peritoneal cancer, death certificate lists ovarian cancer  . Colon cancer Paternal Uncle        dx. late 4s or early 20s  . Basal cell carcinoma Sister 5       a second diagnosed in her 54s  . Kidney cancer Maternal Grandmother 34  . Diabetes Paternal Grandmother   . Heart Problems Paternal Grandmother   . Leukemia Paternal Uncle        dx. early 50s   Patient's father is currently living at age 48 as of 08/12/19. Patient's mother died from ovarian cancer at age 47. She was diagnosed at age 24. The patient reports breast cancer in a maternal aunt, diagnosed in her 23's. The patient also noted colon cancer in a paternal uncle and kidney cancer in her maternal grandmother.  She has 1 sister.   GYNECOLOGIC HISTORY:  No LMP recorded. Patient has had a hysterectomy. Menarche: [age] years old Age at first live birth: 74 years old [place] P 2 LMP age 48 (with hysterectomy) Contraceptive: never used HRT used for 10-12 years  Hysterectomy? Yes, age 18 BSO? 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  Husband Julie Saxon "Butch" Laurann [place] Sr. is a retired Psychologist, prison and probation services. She lives at home with husband Butch. She has two children from a prior marriage. Son Julie Thompson, age 69, works as a Games developer in [place], [place]. Son Julie Thompson, age 86, works as a Programme researcher, broadcasting/film/video in [place], [place]. The patient has 4 grandchildren and 4 great-grandchildren. She attends [employer].    ADVANCED DIRECTIVES: In the absence of any documentation to the contrary, the patient's spouse is their HCPOA.    HEALTH MAINTENANCE: Social History   Tobacco Use  . Smoking status: Never Smoker  . Smokeless tobacco: Never Used  Substance Use Topics  . Alcohol use: No  . Drug use: No     Colonoscopy: 09/2013, with CT virtual 11/2013  PAP: none on file, s/p hysterectomy  Bone density: 04/2001, -0.5   Allergies  Allergen Reactions  . Sulfa Antibiotics Rash  . Penicillins Hives    Current Outpatient Medications  Medication Sig Dispense Refill  . aspirin EC 81 MG tablet Take 81 mg by mouth daily.    Marland Kitchen atorvastatin (LIPITOR) 20 MG tablet Take 20 mg by mouth daily.    . Cholecalciferol (VITAMIN D) 2000 UNITS tablet Take 2,000 Units by mouth daily.    Marland Kitchen  ciprofloxacin (CIPRO) 500 MG tablet Take 1 tablet (500 mg total) by mouth 2 (two) times daily. For 5 days starting 6 days after chemotherapy 40 tablet 1  . co-enzyme Q-10 30 MG capsule Take 20 mg by mouth 3 (three) times daily.    Marland Kitchen dexamethasone (DECADRON) 4 MG tablet Take 2 tablets by mouth daily starting the day after Carboplatin and Cytoxan x 3 days. Take with food. 30 tablet 1  . esomeprazole (NEXIUM) 20 MG capsule Take 20 mg by mouth daily at 12 noon.    . famotidine (PEPCID) 20 MG tablet Take 40 mg by mouth 2 (two) times daily.    Marland Kitchen FIBER PO Take 1 tablet by mouth daily.    . fluconazole (DIFLUCAN) 200 MG tablet Take 1 tablet (200 mg total) by mouth daily. 30 tablet 0  . HYDROcodone-acetaminophen (NORCO/VICODIN) 5-325 MG tablet Take 1 tablet by mouth every 6 (six) hours as needed for moderate pain. 15 tablet 0  . levothyroxine (SYNTHROID) 50 MCG tablet Take 50 mcg by mouth daily.    Marland Kitchen lidocaine-prilocaine (EMLA)  cream Apply 1 application topically as needed. 30 g 0  . loratadine (CLARITIN) 10 MG tablet Take 1 tablet (10 mg total) by mouth daily. 60 tablet 1  . LORazepam (ATIVAN) 0.5 MG tablet Take 1 tablet (0.5 mg total) by mouth at bedtime as needed (Nausea or vomiting). 30 tablet 0  . Multiple Vitamin (MULTIVITAMIN WITH MINERALS) TABS tablet Take 1 tablet by mouth daily.    . nabumetone (RELAFEN) 500 MG tablet Take 500 mg by mouth 2 (two) times daily as needed.    . Omega-3 Fatty Acids (FISH OIL) 1200 MG CAPS Take 1,200 mg by mouth daily.    . ondansetron (ZOFRAN) 8 MG tablet Take 1 tablet (8 mg total) by mouth every 8 (eight) hours as needed for nausea or vomiting. May start no earlier than day 3 of a chemotherapy cycle. 20 tablet 4  . prochlorperazine (COMPAZINE) 10 MG tablet Take 1 tablet (10 mg total) by mouth every 6 (six) hours as needed (Nausea or vomiting). 30 tablet 1  . valACYclovir (VALTREX) 500 MG tablet Take 1 tablet (500 mg total) by mouth 2 (two) times daily. 180 tablet 0   No current facility-administered medications for this visit.    OBJECTIVE: Middle-aged white woman in no acute distress  Vitals:   09/10/19 1208  BP: 139/71  Pulse: 72  Resp: 18  Temp: 97.8 F (36.6 C)  SpO2: 99%     Body mass index is 36.56 kg/m.   Wt Readings from Last 3 Encounters:  09/10/19 187 lb 3.2 oz (84.9 kg)  09/03/19 183 lb 9.6 oz (83.3 kg)  08/27/19 184 lb 8 oz (83.7 kg)      ECOG FS:1 - Symptomatic but completely ambulatory  GENERAL: Patient is a well appearing female in no acute distress HEENT:  Sclerae anicteric. Julie Thompson has resolved, two ulcerations on upper anterior left palate Neck is supple.  NODES:  No cervical, supraclavicular, or axillary lymphadenopathy palpated.  BREAST EXAM:  Small 1-2 cm nodule in left lower breast, no sign of progression noted   LUNGS:  Clear to auscultation bilaterally.  No wheezes or rhonchi. HEART:  Regular rate and rhythm. No murmur  appreciated. ABDOMEN:  Soft, nontender.  Positive, normoactive bowel sounds. No organomegaly palpated. MSK:  No focal spinal tenderness to palpation. Full range of motion bilaterally in the upper extremities. EXTREMITIES:  No peripheral edema.   SKIN:  Clear with  no obvious rashes or skin changes. No nail dyscrasia. NEURO:  Nonfocal. Well oriented.  Appropriate affect.     LAB RESULTS:  CMP     Component Value Date/Time   NA 144 09/10/2019 1151   K 4.1 09/10/2019 1151   CL 111 09/10/2019 1151   CO2 27 09/10/2019 1151   GLUCOSE 106 (H) 09/10/2019 1151   BUN 8 09/10/2019 1151   CREATININE 0.68 09/10/2019 1151   CREATININE 0.94 07/17/2019 0823   CALCIUM 8.4 (L) 09/10/2019 1151   PROT 5.9 (L) 09/10/2019 1151   ALBUMIN 3.3 (L) 09/10/2019 1151   AST 13 (L) 09/10/2019 1151   AST 21 07/17/2019 0823   ALT 9 09/10/2019 1151   ALT 18 07/17/2019 0823   ALKPHOS 86 09/10/2019 1151   BILITOT <0.2 (L) 09/10/2019 1151   BILITOT 0.4 07/17/2019 0823   GFRNONAA >60 09/10/2019 1151   GFRNONAA >60 07/17/2019 0823   GFRAA >60 09/10/2019 1151   GFRAA >60 07/17/2019 0823    No results found for: TOTALPROTELP, ALBUMINELP, A1GS, A2GS, BETS, BETA2SER, GAMS, MSPIKE, SPEI  Lab Results  Component Value Date   WBC 4.9 09/10/2019   NEUTROABS 4.0 09/10/2019   HGB 9.3 (L) 09/10/2019   HCT 29.5 (L) 09/10/2019   MCV 96.7 09/10/2019   PLT 170 09/10/2019    No results found for: LABCA2  No components found for: ZHGDJM426  No results for input(s): INR in the last 168 hours.  No results found for: LABCA2  No results found for: STM196  No results found for: QIW979  No results found for: GXQ119  No results found for: CA2729  No components found for: HGQUANT  No results found for: CEA1 / No results found for: CEA1   No results found for: AFPTUMOR  No results found for: CHROMOGRNA  No results found for: KPAFRELGTCHN, LAMBDASER, KAPLAMBRATIO (kappa/lambda light chains)  No results  found for: HGBA, HGBA2QUANT, HGBFQUANT, HGBSQUAN (Hemoglobinopathy evaluation)   No results found for: LDH  No results found for: IRON, TIBC, IRONPCTSAT (Iron and TIBC)  No results found for: FERRITIN  Urinalysis No results found for: COLORURINE, APPEARANCEUR, LABSPEC, PHURINE, GLUCOSEU, HGBUR, BILIRUBINUR, KETONESUR, PROTEINUR, UROBILINOGEN, NITRITE, LEUKOCYTESUR   STUDIES: No results found.   ELIGIBLE FOR AVAILABLE RESEARCH PROTOCOL: no  ASSESSMENT: 74 y.o. [place] woman status post left breast lower outer quadrant biopsy 07/09/2019 for a clinical T2 N1, stage IIIB invasive ductal carcinoma, grade 3, triple negative, with an MIB-1 of 60%  (a) chest CT scan and bone scan 07/31/2019 showed no evidence of metastatic disease  (b) MRI biopsy of 2 additional areas in the left breast (central, 07/10/2019, and at 830 o'clock on 07/26/2019) are both benign and concordant  (c) biopsy of 2 suspicious areas in the right breast on 07/26/2019 (9:00 and lower outer quadrant) were benign and concordant   (1) neoadjuvant chemotherapy to consist of cyclophosphamide and doxorubicin in dose dense fashion x4 to start 07/30/2019 followed by weekly carboplatin and paclitaxel x12  (2) definitive surgery to follow with targeted axillary lymph node dissection  (3) adjuvant radiation to follow  (4) genetics testing 07/23/2019 through the STAT Breast cancer panel offered by [facility] found no deleterious mutations in ATM, BRCA1, BRCA2, CDH1, CHEK2, PALB2, PTEN, STK11 and TP53.  Additional testing through the Common Hereditary Cancers Panel offered by [facility] also found no deleterious mutations in IPC, ATM, AXIN2, BARD1, BMPR1A, BRCA1, BRCA2, BRIP1, CDH1, CDK4, CDKN2A (p14ARF), CDKN2A (p16INK4a), CHEK2, CTNNA1, DICER1, EPCAM (Deletion/duplication testing only), GREM1 (promoter  region deletion/duplication testing only), KIT, MEN1, MLH1, MSH2, MSH3, MSH6, MUTYH, NBN, NF1, NHTL1, PALB2, PDGFRA, PMS2, POLD1, POLE,  PTEN, RAD50, RAD51C, RAD51D, RNF43, SDHB, SDHC, SDHD, SMAD4, SMARCA4. STK11, TP53, TSC1, TSC2, and VHL.  The following genes were evaluated for sequence changes only: SDHA and HOXB13 c.251G>A variant only.   (a) 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    PLAN: Julie Thompson is doing well today.  She continues on neoadjuvant chemotherapy with Doxorubicin and Cyclophosphamide and will complete her fourth cycle of this treatment today.  She knows that in one week she will start her antibiotics as she frequently becomes neutropenic from this treatment.    Julie Thompson and I discussed her continued present breast nodule.  We will get ultrasound to evaluate the size.  It feels smaller than 3 cm which is what it was on MRI, however, I'd like to get a new baseline that we can monitor during her Paclitaxel/Carboplatin regimen.    Julie Thompson will return in 1 week for labs and f/u.  I reviewed the above with Dr. Jana Hakim who is in agreement. She was recommended to continue with the appropriate pandemic precautions. She knows to call for any questions that may arise between now and her next appointment.  We are happy to see her sooner if needed.    Total encounter time 20 minutes.Wilber Bihari, NP   09/10/2019 12:40 PM Medical Oncology and Hematology [facility] [place],  [address] Tel. [phone]    Fax. [phone]    *Total Encounter Time as defined by the Centers for Medicare and Medicaid Services includes, in addition to the face-to-face time of a patient visit (documented in the note above) non-face-to-face time: obtaining and reviewing outside history, ordering and reviewing medications, tests or procedures, care coordination (communications with other health care professionals or caregivers) and documentation in the medical record.

## 2019-09-11 ENCOUNTER — Telehealth: Payer: Self-pay | Admitting: Adult Health

## 2019-09-11 NOTE — Telephone Encounter (Signed)
No changes were made to pt's schedule. No 3/9 los.

## 2019-09-12 ENCOUNTER — Inpatient Hospital Stay: Payer: Medicare Other

## 2019-09-12 ENCOUNTER — Other Ambulatory Visit: Payer: Self-pay

## 2019-09-12 VITALS — BP 162/85 | HR 91 | Temp 97.8°F | Resp 16

## 2019-09-12 DIAGNOSIS — C50512 Malignant neoplasm of lower-outer quadrant of left female breast: Secondary | ICD-10-CM | POA: Diagnosis not present

## 2019-09-12 DIAGNOSIS — Z90710 Acquired absence of both cervix and uterus: Secondary | ICD-10-CM | POA: Diagnosis not present

## 2019-09-12 DIAGNOSIS — Z79899 Other long term (current) drug therapy: Secondary | ICD-10-CM | POA: Diagnosis not present

## 2019-09-12 DIAGNOSIS — Z7982 Long term (current) use of aspirin: Secondary | ICD-10-CM | POA: Diagnosis not present

## 2019-09-12 DIAGNOSIS — Z7952 Long term (current) use of systemic steroids: Secondary | ICD-10-CM | POA: Diagnosis not present

## 2019-09-12 DIAGNOSIS — D701 Agranulocytosis secondary to cancer chemotherapy: Secondary | ICD-10-CM | POA: Diagnosis not present

## 2019-09-12 DIAGNOSIS — T451X5D Adverse effect of antineoplastic and immunosuppressive drugs, subsequent encounter: Secondary | ICD-10-CM | POA: Diagnosis not present

## 2019-09-12 DIAGNOSIS — Z171 Estrogen receptor negative status [ER-]: Secondary | ICD-10-CM | POA: Diagnosis not present

## 2019-09-12 DIAGNOSIS — C773 Secondary and unspecified malignant neoplasm of axilla and upper limb lymph nodes: Secondary | ICD-10-CM | POA: Diagnosis not present

## 2019-09-12 DIAGNOSIS — Z791 Long term (current) use of non-steroidal anti-inflammatories (NSAID): Secondary | ICD-10-CM | POA: Diagnosis not present

## 2019-09-12 DIAGNOSIS — Z5111 Encounter for antineoplastic chemotherapy: Secondary | ICD-10-CM | POA: Diagnosis not present

## 2019-09-12 MED ORDER — PEGFILGRASTIM-JMDB 6 MG/0.6ML ~~LOC~~ SOSY
6.0000 mg | PREFILLED_SYRINGE | Freq: Once | SUBCUTANEOUS | Status: AC
  Administered 2019-09-12: 6 mg via SUBCUTANEOUS

## 2019-09-12 MED ORDER — PEGFILGRASTIM-JMDB 6 MG/0.6ML ~~LOC~~ SOSY
PREFILLED_SYRINGE | SUBCUTANEOUS | Status: AC
  Filled 2019-09-12: qty 0.6

## 2019-09-16 DIAGNOSIS — E039 Hypothyroidism, unspecified: Secondary | ICD-10-CM | POA: Diagnosis not present

## 2019-09-16 DIAGNOSIS — M170 Bilateral primary osteoarthritis of knee: Secondary | ICD-10-CM | POA: Diagnosis not present

## 2019-09-16 DIAGNOSIS — E7800 Pure hypercholesterolemia, unspecified: Secondary | ICD-10-CM | POA: Diagnosis not present

## 2019-09-16 NOTE — Progress Notes (Signed)
[place]  [phone]) [phone] Fax:([phone]) [phone]     ID: Julie Thompson DOB: 1946/03/04  MR#: [id number]  JEH#:[id number]  Patient Care Team: Leeroy Cha, MD as PCP - General (Internal Medicine) Rockwell Germany, RN as Oncology Nurse Navigator Mauro Kaufmann, RN as Oncology Nurse Navigator Eppie Gibson, MD as Attending Physician (Radiation Oncology) Donnie Mesa, MD as Consulting Physician (General Surgery) Magrinat, Virgie Dad, MD as Consulting Physician (Oncology) Scot Dock, NP OTHER MD:  CHIEF COMPLAINT: triple negative breast cancer  CURRENT TREATMENT: Neoadjuvant chemotherapy   INTERVAL HISTORY: Julie Thompson returns today for follow up of her triple negative breast cancer unaccompanied.  She is doing well today.  She is receiving neoadjuvant chemotherapy first with doxorubicin and cyclophosphamide given on day 1 of a 14 day cycle with Neulasta support on day 3.  She is currently cycle 4 day 8.     REVIEW OF SYSTEMS: Julie Thompson is feeling moderately well today.  She lost power this morning and had to get ready in the dark.  Due to this it made her feel out of sorts.  She has felt better after this final cycle than in cycles past.  She is not as neutropenic today.  She has restarted her after chemotherapy antibiotic.    Julie Thompson's blood pressure is slightly low.  She hasn't had a lot to drink this morning.  She denies dizziness or light headedness.  She did have nausea in particular yesterday.  She thinks this is related to taking medication without eating a good meal.    Julie Thompson had a good weekend, and she has been able to do more around the house.    She denies any fever, chills, chest pain, palpitations, cough, bowel/bladder changes, vomiting, skin changes, headaches, vision issues or any other concerns.  A detailed ROS was otherwise non contributory.     HISTORY OF CURRENT ILLNESS: From the original intake note:  Ahriana Gunkel had routine  screening mammography on 06/21/2019 showing a possible abnormality in the left breast. She underwent left diagnostic mammography with tomography and left breast ultrasonography at [facility] on 07/01/2019 showing: breast density category C; either 2 adjacent masses or 1 complex dumbbell-shaped mass containing calcifications in the left breast at 6 o'clock, the largest of which measures 2.1 cm; chest wall invasion not excluded; 1-2 mm group of calcifications just anterior to the mass(es); single abnormal lymph node in the left axilla; vascular calcifications in anterior left breast.  Accordingly on 07/09/2019 she proceeded to biopsy of the left breast areas in question. The pathology from this procedure ([id number]) showed:  1. Left Breast, 5:30, posterior  - invasive ductal carcinoma with necrosis, grade 3  - Prognostic indicators significant for: estrogen receptor, 0% negative and progesterone receptor, 0% negative. Proliferation marker Ki67 at 60%. HER2 negative by immunohistochemistry (0). 2. Left Breast, 5:30, posterior  - invasive ductal carcinoma with necrosis, grade 3  - ductal carcinoma in situ, intermediate grade 3. Left Axilla, lymph node (1)  - metastatic carcinoma involving a lymph node  And additional biopsy of the anterior vascular calcifications in the left breast was performed on 07/10/2019. Pathology [id number]) showed: fibrocystic changes with calcifications; no malignancy identified.  This was felt to be concordant.  The patient's subsequent history is as detailed below.   PAST MEDICAL HISTORY: Past Medical History:  Diagnosis Date  . Asthma    no problems now  . Family history of basal cell carcinoma   . Family history of breast  cancer   . Family history of colon cancer   . Family history of kidney cancer   . Family history of leukemia   . Family history of peritoneal cancer   . GERD (gastroesophageal reflux disease)   . H/O hiatal hernia   . Hyperlipidemia   .  Hypothyroidism   . IBS (irritable bowel syndrome)   . lt breast ca dx'd 07/10/19    PAST SURGICAL HISTORY: Past Surgical History:  Procedure Laterality Date  . ABDOMINAL HYSTERECTOMY     age 9  . BREAST SURGERY    . DILATION AND CURETTAGE OF UTERUS     2 miscarriages age 63 & 40  . FLEXIBLE SIGMOIDOSCOPY N/A 10/22/2013   Procedure: FLEXIBLE SIGMOIDOSCOPY;  Surgeon: Garlan Fair, MD;  Location: [facility];  Service: Endoscopy;  Laterality: N/A;  . KNEE ARTHROSCOPY Left   . PORTACATH PLACEMENT Right 07/29/2019   Procedure: INSERTION PORT-A-CATH WITH ULTRASOUND;  Surgeon: Donnie Mesa, MD;  Location: [facility];  Service: General;  Laterality: Right;  . TONSILLECTOMY     age 20    FAMILY HISTORY: Family History  Problem Relation Age of Onset  . Breast cancer Maternal Aunt        dx. in her 28s  . Cancer Mother 38       peritoneal cancer, death certificate lists ovarian cancer  . Colon cancer Paternal Uncle        dx. late 89s or early 57s  . Basal cell carcinoma Sister 63       a second diagnosed in her 66s  . Kidney cancer Maternal Grandmother 56  . Diabetes Paternal Grandmother   . Heart Problems Paternal Grandmother   . Leukemia Paternal Uncle        dx. early 67s   Patient's father is currently living at age 72 as of 08-03-19. Patient's mother died from ovarian cancer at age 71. She was diagnosed at age 25. The patient reports breast cancer in a maternal aunt, diagnosed in her 16's. The patient also noted colon cancer in a paternal uncle and kidney cancer in her maternal grandmother.  She has 1 sister.   GYNECOLOGIC HISTORY:  No LMP recorded. Patient has had a hysterectomy. Menarche: [age] years old Age at first live birth: 74 years old [place] P 2 LMP age 40 (with hysterectomy) Contraceptive: never used HRT used for 10-12 years  Hysterectomy? Yes, age 67 BSO? 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  Husband Julie Saxon "Butch" Laurann Montana Sr. is a retired Psychologist, prison and probation services. She lives at home with husband Butch. She has two children from a prior marriage. Son Julie Thompson, age 74, works as a Games developer in [place], [place]. Son Julie Thompson, age 61, works as a Programme researcher, broadcasting/film/video in [place], [place]. The patient has 4 grandchildren and 4 great-grandchildren. She attends [employer].    ADVANCED DIRECTIVES: In the absence of any documentation to the contrary, the patient's spouse is their HCPOA.    HEALTH MAINTENANCE: Social History   Tobacco Use  . Smoking status: Never Smoker  . Smokeless tobacco: Never Used  Substance Use Topics  . Alcohol use: No  . Drug use: No     Colonoscopy: 09/2013, with CT virtual 11/2013  PAP: none on file, s/p hysterectomy  Bone density: 04/2001, -0.5   Allergies  Allergen Reactions  . Sulfa Antibiotics Rash  . Penicillins Hives    Current Outpatient Medications  Medication Sig Dispense Refill  . aspirin EC 81 MG tablet Take 81 mg by mouth daily.    Marland Kitchen atorvastatin (LIPITOR) 20 MG tablet Take 20 mg by mouth daily.    . Cholecalciferol (VITAMIN D) 2000 UNITS tablet Take 2,000 Units by mouth daily.    . ciprofloxacin (CIPRO) 500 MG tablet Take 1 tablet (500 mg total) by mouth 2 (two) times daily. For 5 days starting 6 days after chemotherapy 40 tablet 1  . co-enzyme Q-10 30 MG capsule Take 20 mg by mouth 3 (three) times daily.    Marland Kitchen dexamethasone (DECADRON) 4 MG tablet Take 2 tablets by mouth daily starting the day after Carboplatin and Cytoxan x 3 days. Take with food. 30 tablet 1  . esomeprazole (NEXIUM) 20 MG capsule Take 20 mg by mouth daily at 12 noon.    . famotidine (PEPCID) 20 MG tablet Take 40 mg by mouth 2 (two) times daily.    Marland Kitchen FIBER PO Take 1 tablet by mouth daily.    . fluconazole (DIFLUCAN) 200 MG tablet Take 1 tablet (200 mg total) by mouth daily. 30 tablet 0  . HYDROcodone-acetaminophen (NORCO/VICODIN) 5-325 MG tablet Take 1 tablet  by mouth every 6 (six) hours as needed for moderate pain. 15 tablet 0  . levothyroxine (SYNTHROID) 50 MCG tablet Take 50 mcg by mouth daily.    Marland Kitchen lidocaine-prilocaine (EMLA) cream Apply 1 application topically as needed. 30 g 0  . loratadine (CLARITIN) 10 MG tablet Take 1 tablet (10 mg total) by mouth daily. 60 tablet 1  . LORazepam (ATIVAN) 0.5 MG tablet Take 1 tablet (0.5 mg total) by mouth at bedtime as needed (Nausea or vomiting). 30 tablet 0  . Multiple Vitamin (MULTIVITAMIN WITH MINERALS) TABS tablet Take 1 tablet by mouth daily.    . nabumetone (RELAFEN) 500 MG tablet Take 500 mg by mouth 2 (two) times daily as needed.    . Omega-3 Fatty Acids (FISH OIL) 1200 MG CAPS Take 1,200 mg by mouth daily.    . ondansetron (ZOFRAN) 8 MG tablet Take 1 tablet (8 mg total) by mouth every 8 (eight) hours as needed for nausea or vomiting. May start no earlier than day 3 of a chemotherapy cycle. 20 tablet 4  . prochlorperazine (COMPAZINE) 10 MG tablet Take 1 tablet (10 mg total) by mouth every 6 (six) hours as needed (Nausea or vomiting). 30 tablet 1  . valACYclovir (VALTREX) 500 MG tablet Take 1 tablet (500 mg total) by mouth 2 (two) times daily. 180 tablet 0   No current facility-administered medications for this visit.    OBJECTIVE: Middle-aged white woman in no acute distress  Vitals:   09/17/19 0845  BP: (!) 102/54  Pulse: (!) 105  Resp: 18  Temp: 98.2 F (36.8 C)  SpO2: 99%     Body mass index is 35.49 kg/m.   Wt Readings from Last 3 Encounters:  09/17/19 181 lb 11.2 oz (82.4 kg)  09/10/19 187 lb 3.2 oz (84.9 kg)  09/03/19 183 lb 9.6 oz (83.3 kg)      ECOG FS:1 - Symptomatic but completely ambulatory  GENERAL: Patient is a well appearing female in no acute distress HEENT:  Sclerae anicteric. Julie Thompson has resolved, two ulcerations on upper anterior left palate Neck is supple.  NODES:  No cervical, supraclavicular, or axillary lymphadenopathy palpated.  BREAST EXAM:  Deferred  today LUNGS:  Clear to auscultation bilaterally.  No wheezes or rhonchi. HEART:  Regular rate and rhythm. No murmur  appreciated. ABDOMEN:  Soft, nontender.  Positive, normoactive bowel sounds. No organomegaly palpated. MSK:  No focal spinal tenderness to palpation. Full range of motion bilaterally in the upper extremities. EXTREMITIES:  No peripheral edema.   SKIN:  Clear with no obvious rashes or skin changes. No nail dyscrasia. NEURO:  Nonfocal. Well oriented.  Appropriate affect.     LAB RESULTS:  CMP     Component Value Date/Time   NA 142 09/17/2019 0829   K 3.6 09/17/2019 0829   CL 107 09/17/2019 0829   CO2 25 09/17/2019 0829   GLUCOSE 112 (H) 09/17/2019 0829   BUN 18 09/17/2019 0829   CREATININE 0.69 09/17/2019 0829   CREATININE 0.94 07/17/2019 0823   CALCIUM 8.8 (L) 09/17/2019 0829   PROT 5.8 (L) 09/17/2019 0829   ALBUMIN 3.4 (L) 09/17/2019 0829   AST 10 (L) 09/17/2019 0829   AST 21 07/17/2019 0823   ALT 10 09/17/2019 0829   ALT 18 07/17/2019 0823   ALKPHOS 80 09/17/2019 0829   BILITOT 0.4 09/17/2019 0829   BILITOT 0.4 07/17/2019 0823   GFRNONAA >60 09/17/2019 0829   GFRNONAA >60 07/17/2019 0823   GFRAA >60 09/17/2019 0829   GFRAA >60 07/17/2019 0823    No results found for: TOTALPROTELP, ALBUMINELP, A1GS, A2GS, BETS, BETA2SER, GAMS, MSPIKE, SPEI  Lab Results  Component Value Date   WBC 1.4 (L) 09/17/2019   NEUTROABS 0.9 (L) 09/17/2019   HGB 9.6 (L) 09/17/2019   HCT 29.7 (L) 09/17/2019   MCV 96.7 09/17/2019   PLT 135 (L) 09/17/2019    No results found for: LABCA2  No components found for: WEXHBZ169  No results for input(s): INR in the last 168 hours.  No results found for: LABCA2  No results found for: CVE938  No results found for: BOF751  No results found for: WCH852  No results found for: CA2729  No components found for: HGQUANT  No results found for: CEA1 / No results found for: CEA1   No results found for: AFPTUMOR  No results  found for: CHROMOGRNA  No results found for: KPAFRELGTCHN, LAMBDASER, KAPLAMBRATIO (kappa/lambda light chains)  No results found for: HGBA, HGBA2QUANT, HGBFQUANT, HGBSQUAN (Hemoglobinopathy evaluation)   No results found for: LDH  No results found for: IRON, TIBC, IRONPCTSAT (Iron and TIBC)  No results found for: FERRITIN  Urinalysis No results found for: COLORURINE, APPEARANCEUR, LABSPEC, PHURINE, GLUCOSEU, HGBUR, BILIRUBINUR, KETONESUR, PROTEINUR, UROBILINOGEN, NITRITE, LEUKOCYTESUR   STUDIES: No results found.   ELIGIBLE FOR AVAILABLE RESEARCH PROTOCOL: no  ASSESSMENT: 74 y.o. [place] woman status post left breast lower outer quadrant biopsy 07/09/2019 for a clinical T2 N1, stage IIIB invasive ductal carcinoma, grade 3, triple negative, with an MIB-1 of 60%  (a) chest CT scan and bone scan 07/31/2019 showed no evidence of metastatic disease  (b) MRI biopsy of 2 additional areas in the left breast (central, 07/10/2019, and at 830 o'clock on 07/26/2019) are both benign and concordant  (c) biopsy of 2 suspicious areas in the right breast on 07/26/2019 (9:00 and lower outer quadrant) were benign and concordant   (1) neoadjuvant chemotherapy to consist of cyclophosphamide and doxorubicin in dose dense fashion x4 to start 07/30/2019 followed by weekly carboplatin and paclitaxel x12  (2) definitive surgery to follow with targeted axillary lymph node dissection  (3) adjuvant radiation to follow  (4) genetics testing 07/23/2019 through the STAT Breast cancer panel offered by [facility] found no deleterious mutations in ATM, BRCA1, BRCA2, CDH1, CHEK2, PALB2, PTEN, STK11  and TP53.  Additional testing through the Common Hereditary Cancers Panel offered by [facility] also found no deleterious mutations in IPC, ATM, AXIN2, BARD1, BMPR1A, BRCA1, BRCA2, BRIP1, CDH1, CDK4, CDKN2A (p14ARF), CDKN2A (p16INK4a), CHEK2, CTNNA1, DICER1, EPCAM (Deletion/duplication testing only), GREM1 (promoter region  deletion/duplication testing only), KIT, MEN1, MLH1, MSH2, MSH3, MSH6, MUTYH, NBN, NF1, NHTL1, PALB2, PDGFRA, PMS2, POLD1, POLE, PTEN, RAD50, RAD51C, RAD51D, RNF43, SDHB, SDHC, SDHD, SMAD4, SMARCA4. STK11, TP53, TSC1, TSC2, and VHL.  The following genes were evaluated for sequence changes only: SDHA and HOXB13 c.251G>A variant only.   (a) 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    PLAN: Leola tolerated her final cycle of Doxorubicin and cyclophosphamide very well.  I reviewed her labs with her which are stable, her anc is mildly decreased to 0.9.  We reviewed neutropenic precautions.  She is managing her nausea with anti emetics.  She is not as fatigued and I recommended her to continue with her activity level.    Sharica still has palpable disease, and it is decreased, but still present.  She will undergo breast ultrasound on 09/25/2019.   Jaylaa's blood pressure is slightly decreased.  She is asymptomatic.  She notes she plans to go home and drink a gallon of water as she didn't drink much this morning due to her power being out.  She knows to call if she does develop any dizziness or light headedness.  Julie Thompson and I discussed the weekly Paclitaxel and Carboplatin today in detail.  We reviewed peripheral neuropathy and crytohterapy in particular.  She knows to bring ice bags.  We will talk further about it next week.    She was recommended to continue with the appropriate pandemic precautions. She knows to call for any questions that may arise between now and her next appointment.  We are happy to see her sooner if needed.    Total encounter time 30 minutes.Wilber Bihari, NP   09/17/2019 9:21 AM Medical Oncology and Hematology [facility] [place], [place] [address] Tel. [phone]    Fax. [phone]    *Total Encounter Time as defined by the Centers for Medicare and Medicaid Services includes, in addition to  the face-to-face time of a patient visit (documented in the note above) non-face-to-face time: obtaining and reviewing outside history, ordering and reviewing medications, tests or procedures, care coordination (communications with other health care professionals or caregivers) and documentation in the medical record.

## 2019-09-17 ENCOUNTER — Inpatient Hospital Stay: Payer: Medicare Other | Admitting: Adult Health

## 2019-09-17 ENCOUNTER — Inpatient Hospital Stay: Payer: Medicare Other

## 2019-09-17 ENCOUNTER — Other Ambulatory Visit: Payer: Self-pay

## 2019-09-17 ENCOUNTER — Encounter: Payer: Self-pay | Admitting: Adult Health

## 2019-09-17 VITALS — BP 102/54 | HR 105 | Temp 98.2°F | Resp 18 | Ht 60.0 in | Wt 181.7 lb

## 2019-09-17 DIAGNOSIS — C50512 Malignant neoplasm of lower-outer quadrant of left female breast: Secondary | ICD-10-CM

## 2019-09-17 DIAGNOSIS — Z7952 Long term (current) use of systemic steroids: Secondary | ICD-10-CM | POA: Diagnosis not present

## 2019-09-17 DIAGNOSIS — Z171 Estrogen receptor negative status [ER-]: Secondary | ICD-10-CM | POA: Diagnosis not present

## 2019-09-17 DIAGNOSIS — D701 Agranulocytosis secondary to cancer chemotherapy: Secondary | ICD-10-CM | POA: Diagnosis not present

## 2019-09-17 DIAGNOSIS — Z7982 Long term (current) use of aspirin: Secondary | ICD-10-CM | POA: Diagnosis not present

## 2019-09-17 DIAGNOSIS — Z79899 Other long term (current) drug therapy: Secondary | ICD-10-CM | POA: Diagnosis not present

## 2019-09-17 DIAGNOSIS — Z95828 Presence of other vascular implants and grafts: Secondary | ICD-10-CM

## 2019-09-17 DIAGNOSIS — Z90710 Acquired absence of both cervix and uterus: Secondary | ICD-10-CM | POA: Diagnosis not present

## 2019-09-17 DIAGNOSIS — Z791 Long term (current) use of non-steroidal anti-inflammatories (NSAID): Secondary | ICD-10-CM | POA: Diagnosis not present

## 2019-09-17 DIAGNOSIS — C773 Secondary and unspecified malignant neoplasm of axilla and upper limb lymph nodes: Secondary | ICD-10-CM | POA: Diagnosis not present

## 2019-09-17 DIAGNOSIS — Z5111 Encounter for antineoplastic chemotherapy: Secondary | ICD-10-CM | POA: Diagnosis not present

## 2019-09-17 DIAGNOSIS — T451X5D Adverse effect of antineoplastic and immunosuppressive drugs, subsequent encounter: Secondary | ICD-10-CM | POA: Diagnosis not present

## 2019-09-17 LAB — CBC WITH DIFFERENTIAL/PLATELET
Abs Immature Granulocytes: 0.21 10*3/uL — ABNORMAL HIGH (ref 0.00–0.07)
Basophils Absolute: 0 10*3/uL (ref 0.0–0.1)
Basophils Relative: 1 %
Eosinophils Absolute: 0 10*3/uL (ref 0.0–0.5)
Eosinophils Relative: 1 %
HCT: 29.7 % — ABNORMAL LOW (ref 36.0–46.0)
Hemoglobin: 9.6 g/dL — ABNORMAL LOW (ref 12.0–15.0)
Immature Granulocytes: 15 %
Lymphocytes Relative: 11 %
Lymphs Abs: 0.2 10*3/uL — ABNORMAL LOW (ref 0.7–4.0)
MCH: 31.3 pg (ref 26.0–34.0)
MCHC: 32.3 g/dL (ref 30.0–36.0)
MCV: 96.7 fL (ref 80.0–100.0)
Monocytes Absolute: 0.1 10*3/uL (ref 0.1–1.0)
Monocytes Relative: 7 %
Neutro Abs: 0.9 10*3/uL — ABNORMAL LOW (ref 1.7–7.7)
Neutrophils Relative %: 65 %
Platelets: 135 10*3/uL — ABNORMAL LOW (ref 150–400)
RBC: 3.07 MIL/uL — ABNORMAL LOW (ref 3.87–5.11)
RDW: 15.4 % (ref 11.5–15.5)
WBC: 1.4 10*3/uL — ABNORMAL LOW (ref 4.0–10.5)
nRBC: 0 % (ref 0.0–0.2)

## 2019-09-17 LAB — COMPREHENSIVE METABOLIC PANEL
ALT: 10 U/L (ref 0–44)
AST: 10 U/L — ABNORMAL LOW (ref 15–41)
Albumin: 3.4 g/dL — ABNORMAL LOW (ref 3.5–5.0)
Alkaline Phosphatase: 80 U/L (ref 38–126)
Anion gap: 10 (ref 5–15)
BUN: 18 mg/dL (ref 8–23)
CO2: 25 mmol/L (ref 22–32)
Calcium: 8.8 mg/dL — ABNORMAL LOW (ref 8.9–10.3)
Chloride: 107 mmol/L (ref 98–111)
Creatinine, Ser: 0.69 mg/dL (ref 0.44–1.00)
GFR calc Af Amer: >60 mL/min (ref >60)
GFR calc non Af Amer: >60 mL/min (ref >60)
Glucose, Bld: 112 mg/dL — ABNORMAL HIGH (ref 70–99)
Potassium: 3.6 mmol/L (ref 3.5–5.1)
Sodium: 142 mmol/L (ref 135–145)
Total Bilirubin: 0.4 mg/dL (ref 0.3–1.2)
Total Protein: 5.8 g/dL — ABNORMAL LOW (ref 6.5–8.1)

## 2019-09-17 MED ORDER — HEPARIN SOD (PORK) LOCK FLUSH 100 UNIT/ML IV SOLN
500.0000 [IU] | Freq: Once | INTRAVENOUS | Status: AC
  Administered 2019-09-17: 08:00:00 500 [IU] via INTRAVENOUS
  Filled 2019-09-17: qty 5

## 2019-09-17 MED ORDER — HEPARIN SOD (PORK) LOCK FLUSH 100 UNIT/ML IV SOLN
500.0000 [IU] | Freq: Once | INTRAVENOUS | Status: DC
  Filled 2019-09-17: qty 5

## 2019-09-17 MED ORDER — SODIUM CHLORIDE 0.9% FLUSH
10.0000 mL | INTRAVENOUS | Status: DC | PRN
  Administered 2019-09-17: 10 mL via INTRAVENOUS
  Filled 2019-09-17: qty 10

## 2019-09-17 MED ORDER — SODIUM CHLORIDE 0.9% FLUSH
10.0000 mL | INTRAVENOUS | Status: DC | PRN
  Filled 2019-09-17: qty 10

## 2019-09-17 NOTE — Progress Notes (Signed)
Spoke w/ Mendel Ryder - keep emend during the carboplatin AUC = 2 and paclitaxel cycles due to nausea with previous cycles.   Demetrius Charity, PharmD, BCPS, [place] [facility] Pharmacist Pharmacy Phone: [phone] 09/17/2019

## 2019-09-23 NOTE — Progress Notes (Signed)
[place]  [phone] [phone]     ID: [name] DOB: 04/26/1946  MR#: [id number]  BHA#:[id number]  Patient Care Team: Leeroy Cha, MD as PCP - General (Internal Medicine) Rockwell Germany, RN as Oncology Nurse Navigator Mauro Kaufmann, RN as Oncology Nurse Navigator Eppie Gibson, MD as Attending Physician (Radiation Oncology) Donnie Mesa, MD as Consulting Physician (General Surgery) Magrinat, Virgie Dad, MD as Consulting Physician (Oncology) Scot Dock, NP OTHER MD:  CHIEF COMPLAINT: triple negative breast cancer  CURRENT TREATMENT: Neoadjuvant chemotherapy   INTERVAL HISTORY: Julie Thompson returns today for follow up of her triple negative breast cancer unaccompanied.  She is doing well today.    She completed four cycles of neoadjuvant Doxorubicin and Cyclophosphamide and is here today to start weekly Paclitaxel and carboplatin x 12.  Today is week 1 of treatment.   REVIEW OF SYSTEMS: [name] is doing well today.  She has a mild sore throat, and notes she typically has allergies this time of year.  She denies fever, chills, cough, shortness of breath, nasal drainage.  She is without bowel/bladder changes, nausea, vomiting.    [name] activity level is improved as compared to last week.  She is walking, and taking care of the house, and enjoying the nicer weather.  A detailed ROS was otherwise non contributory.     HISTORY OF CURRENT ILLNESS: From the original intake note:  Julie Thompson had routine screening mammography on 06/21/2019 showing a possible abnormality in the left breast. She underwent left diagnostic mammography with tomography and left breast ultrasonography at [facility] on 07/01/2019 showing: breast density category C; either 2 adjacent masses or 1 complex dumbbell-shaped mass containing calcifications in the left breast at 6 o'clock, the largest of which measures 2.1 cm; chest wall invasion  not excluded; 1-2 mm group of calcifications just anterior to the mass(es); single abnormal lymph node in the left axilla; vascular calcifications in anterior left breast.  Accordingly on 07/09/2019 she proceeded to biopsy of the left breast areas in question. The pathology from this procedure ([id number]) showed:  1. Left Breast, 5:30, posterior  - invasive ductal carcinoma with necrosis, grade 3  - Prognostic indicators significant for: estrogen receptor, 0% negative and progesterone receptor, 0% negative. Proliferation marker Ki67 at 60%. HER2 negative by immunohistochemistry (0). 2. Left Breast, 5:30, posterior  - invasive ductal carcinoma with necrosis, grade 3  - ductal carcinoma in situ, intermediate grade 3. Left Axilla, lymph node (1)  - metastatic carcinoma involving a lymph node  And additional biopsy of the anterior vascular calcifications in the left breast was performed on 07/10/2019. Pathology [id number]) showed: fibrocystic changes with calcifications; no malignancy identified.  This was felt to be concordant.  The patient's subsequent history is as detailed below.   PAST MEDICAL HISTORY: Past Medical History:  Diagnosis Date  . Asthma    no problems now  . Family history of basal cell carcinoma   . Family history of breast cancer   . Family history of colon cancer   . Family history of kidney cancer   . Family history of leukemia   . Family history of peritoneal cancer   . GERD (gastroesophageal reflux disease)   . H/O hiatal hernia   . Hyperlipidemia   . Hypothyroidism   . IBS (irritable bowel syndrome)   . lt breast ca dx'd 07/10/19    PAST SURGICAL HISTORY: Past Surgical History:  Procedure Laterality Date  . ABDOMINAL HYSTERECTOMY  age 32  . BREAST SURGERY    . DILATION AND CURETTAGE OF UTERUS     2 miscarriages age 21 & 87  . FLEXIBLE SIGMOIDOSCOPY N/A 10/22/2013   Procedure: FLEXIBLE SIGMOIDOSCOPY;  Surgeon: Garlan Fair, MD;  Location: [facility];   Service: Endoscopy;  Laterality: N/A;  . KNEE ARTHROSCOPY Left   . PORTACATH PLACEMENT Right 07/29/2019   Procedure: INSERTION PORT-A-CATH WITH ULTRASOUND;  Surgeon: Donnie Mesa, MD;  Location: [facility];  Service: General;  Laterality: Right;  . TONSILLECTOMY     age 27    FAMILY HISTORY: Family History  Problem Relation Age of Onset  . Breast cancer Maternal Aunt        dx. in her 15s  . Cancer Mother 42       peritoneal cancer, death certificate lists ovarian cancer  . Colon cancer Paternal Uncle        dx. late 11s or early 58s  . Basal cell carcinoma Sister 56       a second diagnosed in her 60s  . Kidney cancer Maternal Grandmother 52  . Diabetes Paternal Grandmother   . Heart Problems Paternal Grandmother   . Leukemia Paternal Uncle        dx. early 33s   Patient's father is currently living at age 72 as of 08/11/2019. Patient's mother died from ovarian cancer at age 66. She was diagnosed at age 48. The patient reports breast cancer in a maternal aunt, diagnosed in her 59's. The patient also noted colon cancer in a paternal uncle and kidney cancer in her maternal grandmother.  She has 1 sister.   GYNECOLOGIC HISTORY:  No LMP recorded. Patient has had a hysterectomy. Menarche: [age] years old Age at first live birth: 74 years old [place] P 2 LMP age 40 (with hysterectomy) Contraceptive: never used HRT used for 10-12 years  Hysterectomy? Yes, age 4 BSO? 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  Husband Julie Thompson "Butch" Laurann Montana Sr. is a retired Psychologist, prison and probation services. She lives at home with husband Butch. She has two children from a prior marriage. Son Julie Thompson, age 84, works as a Games developer in [place], [place]. Son Julie Thompson, age 85, works as a Programme researcher, broadcasting/film/video in [place], [place]. The patient has 4 grandchildren and 4 great-grandchildren. She attends [employer].    ADVANCED DIRECTIVES: In the  absence of any documentation to the contrary, the patient's spouse is their HCPOA.    HEALTH MAINTENANCE: Social History   Tobacco Use  . Smoking status: Never Smoker  . Smokeless tobacco: Never Used  Substance Use Topics  . Alcohol use: No  . Drug use: No     Colonoscopy: 09/2013, with CT virtual 11/2013  PAP: none on file, s/p hysterectomy  Bone density: 04/2001, -0.5   Allergies  Allergen Reactions  . Sulfa Antibiotics Rash  . Penicillins Hives    Current Outpatient Medications  Medication Sig Dispense Refill  . aspirin EC 81 MG tablet Take 81 mg by mouth daily.    Marland Kitchen atorvastatin (LIPITOR) 20 MG tablet Take 20 mg by mouth daily.    . Cholecalciferol (VITAMIN D) 2000 UNITS tablet Take 2,000 Units by mouth daily.    Marland Kitchen co-enzyme Q-10 30 MG capsule Take 20 mg by mouth 3 (three) times daily.    Marland Kitchen esomeprazole (NEXIUM) 20 MG capsule Take 20 mg by mouth daily at 12 noon.    . famotidine (  PEPCID) 20 MG tablet Take 40 mg by mouth 2 (two) times daily.    Marland Kitchen FIBER PO Take 1 tablet by mouth daily.    . fluconazole (DIFLUCAN) 200 MG tablet Take 1 tablet (200 mg total) by mouth daily. 30 tablet 0  . HYDROcodone-acetaminophen (NORCO/VICODIN) 5-325 MG tablet Take 1 tablet by mouth every 6 (six) hours as needed for moderate pain. 15 tablet 0  . levothyroxine (SYNTHROID) 50 MCG tablet Take 50 mcg by mouth daily.    Marland Kitchen lidocaine-prilocaine (EMLA) cream Apply 1 application topically as needed. 30 g 0  . loratadine (CLARITIN) 10 MG tablet Take 1 tablet (10 mg total) by mouth daily. 60 tablet 1  . LORazepam (ATIVAN) 0.5 MG tablet Take 1 tablet (0.5 mg total) by mouth at bedtime as needed (Nausea or vomiting). 30 tablet 0  . Multiple Vitamin (MULTIVITAMIN WITH MINERALS) TABS tablet Take 1 tablet by mouth daily.    . nabumetone (RELAFEN) 500 MG tablet Take 500 mg by mouth 2 (two) times daily as needed.    . Omega-3 Fatty Acids (FISH OIL) 1200 MG CAPS Take 1,200 mg by mouth daily.    . ondansetron  (ZOFRAN) 8 MG tablet Take 1 tablet (8 mg total) by mouth every 8 (eight) hours as needed for nausea or vomiting. May start no earlier than day 3 of a chemotherapy cycle. 20 tablet 4  . prochlorperazine (COMPAZINE) 10 MG tablet Take 1 tablet (10 mg total) by mouth every 6 (six) hours as needed (Nausea or vomiting). 30 tablet 1  . valACYclovir (VALTREX) 500 MG tablet Take 1 tablet (500 mg total) by mouth 2 (two) times daily. 180 tablet 0   No current facility-administered medications for this visit.    OBJECTIVE:   Vitals:   09/24/19 0923  BP: 130/77  Pulse: 91  Resp: 18  Temp: 98.2 F (36.8 C)  SpO2: 99%     Body mass index is 36.36 kg/m.   Wt Readings from Last 3 Encounters:  09/24/19 186 lb 3.2 oz (84.5 kg)  09/17/19 181 lb 11.2 oz (82.4 kg)  09/10/19 187 lb 3.2 oz (84.9 kg)      ECOG FS:1 - Symptomatic but completely ambulatory  GENERAL: Patient is a well appearing female in no acute distress HEENT:  Sclerae anicteric. Ritta Slot has resolved, two ulcerations on upper anterior left palate Neck is supple.  NODES:  No cervical, supraclavicular, or axillary lymphadenopathy palpated.  BREAST EXAM:  Left breast lesion is about 1cm in lower breast, no sign of progression noted LUNGS:  Clear to auscultation bilaterally.  No wheezes or rhonchi. HEART:  Regular rate and rhythm. No murmur appreciated. ABDOMEN:  Soft, nontender.  Positive, normoactive bowel sounds. No organomegaly palpated. MSK:  No focal spinal tenderness to palpation. Full range of motion bilaterally in the upper extremities. EXTREMITIES:  No peripheral edema.   SKIN:  Clear with no obvious rashes or skin changes. No nail dyscrasia. NEURO:  Nonfocal. Well oriented.  Appropriate affect.     LAB RESULTS:  CMP     Component Value Date/Time   NA 142 09/17/2019 0829   K 3.6 09/17/2019 0829   CL 107 09/17/2019 0829   CO2 25 09/17/2019 0829   GLUCOSE 112 (H) 09/17/2019 0829   BUN 18 09/17/2019 0829   CREATININE  0.69 09/17/2019 0829   CREATININE 0.94 07/17/2019 0823   CALCIUM 8.8 (L) 09/17/2019 0829   PROT 5.8 (L) 09/17/2019 0829   ALBUMIN 3.4 (L) 09/17/2019 3212  AST 10 (L) 09/17/2019 0829   AST 21 07/17/2019 0823   ALT 10 09/17/2019 0829   ALT 18 07/17/2019 0823   ALKPHOS 80 09/17/2019 0829   BILITOT 0.4 09/17/2019 0829   BILITOT 0.4 07/17/2019 0823   GFRNONAA >60 09/17/2019 0829   GFRNONAA >60 07/17/2019 0823   GFRAA >60 09/17/2019 0829   GFRAA >60 07/17/2019 0823    No results found for: TOTALPROTELP, ALBUMINELP, A1GS, A2GS, BETS, BETA2SER, GAMS, MSPIKE, SPEI  Lab Results  Component Value Date   WBC 5.4 09/24/2019   NEUTROABS 4.5 09/24/2019   HGB 8.9 (L) 09/24/2019   HCT 28.0 (L) 09/24/2019   MCV 98.9 09/24/2019   PLT 118 (L) 09/24/2019    No results found for: LABCA2  No components found for: RJPVGK815  No results for input(s): INR in the last 168 hours.  No results found for: LABCA2  No results found for: TEL076  No results found for: JHH834  No results found for: PBD578  No results found for: CA2729  No components found for: HGQUANT  No results found for: CEA1 / No results found for: CEA1   No results found for: AFPTUMOR  No results found for: CHROMOGRNA  No results found for: KPAFRELGTCHN, LAMBDASER, KAPLAMBRATIO (kappa/lambda light chains)  No results found for: HGBA, HGBA2QUANT, HGBFQUANT, HGBSQUAN (Hemoglobinopathy evaluation)   No results found for: LDH  No results found for: IRON, TIBC, IRONPCTSAT (Iron and TIBC)  No results found for: FERRITIN  Urinalysis No results found for: COLORURINE, APPEARANCEUR, LABSPEC, PHURINE, GLUCOSEU, HGBUR, BILIRUBINUR, KETONESUR, PROTEINUR, UROBILINOGEN, NITRITE, LEUKOCYTESUR   STUDIES: No results found.   ELIGIBLE FOR AVAILABLE RESEARCH PROTOCOL: no  ASSESSMENT: 74 y.o. [place] woman status post left breast lower outer quadrant biopsy 07/09/2019 for a clinical T2 N1, stage IIIB invasive ductal  carcinoma, grade 3, triple negative, with an MIB-1 of 60%  (a) chest CT scan and bone scan 07/31/2019 showed no evidence of metastatic disease  (b) MRI biopsy of 2 additional areas in the left breast (central, 07/10/2019, and at 830 o'clock on 07/26/2019) are both benign and concordant  (c) biopsy of 2 suspicious areas in the right breast on 07/26/2019 (9:00 and lower outer quadrant) were benign and concordant   (1) neoadjuvant chemotherapy to consist of cyclophosphamide and doxorubicin in dose dense fashion x4 to start 07/30/2019 followed by weekly carboplatin and paclitaxel x12  (2) definitive surgery to follow with targeted axillary lymph node dissection  (3) adjuvant radiation to follow  (4) genetics testing 07/23/2019 through the STAT Breast cancer panel offered by [facility] found no deleterious mutations in ATM, BRCA1, BRCA2, CDH1, CHEK2, PALB2, PTEN, STK11 and TP53.  Additional testing through the Common Hereditary Cancers Panel offered by [facility] also found no deleterious mutations in IPC, ATM, AXIN2, BARD1, BMPR1A, BRCA1, BRCA2, BRIP1, CDH1, CDK4, CDKN2A (p14ARF), CDKN2A (p16INK4a), CHEK2, CTNNA1, DICER1, EPCAM (Deletion/duplication testing only), GREM1 (promoter region deletion/duplication testing only), KIT, MEN1, MLH1, MSH2, MSH3, MSH6, MUTYH, NBN, NF1, NHTL1, PALB2, PDGFRA, PMS2, POLD1, POLE, PTEN, RAD50, RAD51C, RAD51D, RNF43, SDHB, SDHC, SDHD, SMAD4, SMARCA4. STK11, TP53, TSC1, TSC2, and VHL.  The following genes were evaluated for sequence changes only: SDHA and HOXB13 c.251G>A variant only.   (a) 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    PLAN: Lakeyta is doing well today. She will begin weekly chemotherapy with Paclitaxel and Carboplatin x 12 weeks.  She and I reviewed the treatment and possible side effects in detail.  She understands  how to take her anti nausea medications, and I updated her medication list.    Her tumor feels smaller  today than when I assessed it last time.  However, since she has persistent palpable disease, we are going to get an ultrasound tomorrow to establish a new baseline.    Peter Congo and I talked about cryotherapy in detail and she plans on icing her fingertips and toes to help prevent peripheral neuropathy.  We also talked about nausea, possible decreased blood counts, achiness, nail changes, peripheral neruopathy.  Her mild sore throat is likely secondary to allergies, however if she develops other symptoms, she will call me and we will get covid testing.      Nikhita will return in 1 week for labs, f/u with Dr. Jana Hakim, and her next treatment.  She was recommended to continue with the appropriate pandemic precautions. She knows to call for any questions that may arise between now and her next appointment.  We are happy to see her sooner if needed.  Total encounter time 20 minutes.Wilber Bihari, NP   09/24/2019 10:00 AM Medical Oncology and Hematology [facility] [place], [place] [address] Tel. [phone]    Fax. [phone]    *Total Encounter Time as defined by the Centers for Medicare and Medicaid Services includes, in addition to the face-to-face time of a patient visit (documented in the note above) non-face-to-face time: obtaining and reviewing outside history, ordering and reviewing medications, tests or procedures, care coordination (communications with other health care professionals or caregivers) and documentation in the medical record.

## 2019-09-24 ENCOUNTER — Encounter: Payer: Self-pay | Admitting: Oncology

## 2019-09-24 ENCOUNTER — Encounter: Payer: Self-pay | Admitting: Adult Health

## 2019-09-24 ENCOUNTER — Inpatient Hospital Stay: Payer: Medicare Other

## 2019-09-24 ENCOUNTER — Other Ambulatory Visit: Payer: Self-pay

## 2019-09-24 ENCOUNTER — Inpatient Hospital Stay: Payer: Medicare Other | Admitting: Adult Health

## 2019-09-24 VITALS — BP 128/79 | HR 72 | Temp 98.7°F | Resp 18

## 2019-09-24 VITALS — BP 130/77 | HR 91 | Temp 98.2°F | Resp 18 | Ht 60.0 in | Wt 186.2 lb

## 2019-09-24 DIAGNOSIS — Z79899 Other long term (current) drug therapy: Secondary | ICD-10-CM | POA: Diagnosis not present

## 2019-09-24 DIAGNOSIS — D701 Agranulocytosis secondary to cancer chemotherapy: Secondary | ICD-10-CM | POA: Diagnosis not present

## 2019-09-24 DIAGNOSIS — Z791 Long term (current) use of non-steroidal anti-inflammatories (NSAID): Secondary | ICD-10-CM | POA: Diagnosis not present

## 2019-09-24 DIAGNOSIS — Z95828 Presence of other vascular implants and grafts: Secondary | ICD-10-CM

## 2019-09-24 DIAGNOSIS — C50512 Malignant neoplasm of lower-outer quadrant of left female breast: Secondary | ICD-10-CM

## 2019-09-24 DIAGNOSIS — Z5111 Encounter for antineoplastic chemotherapy: Secondary | ICD-10-CM | POA: Diagnosis not present

## 2019-09-24 DIAGNOSIS — Z171 Estrogen receptor negative status [ER-]: Secondary | ICD-10-CM

## 2019-09-24 DIAGNOSIS — Z90710 Acquired absence of both cervix and uterus: Secondary | ICD-10-CM | POA: Diagnosis not present

## 2019-09-24 DIAGNOSIS — T451X5D Adverse effect of antineoplastic and immunosuppressive drugs, subsequent encounter: Secondary | ICD-10-CM | POA: Diagnosis not present

## 2019-09-24 DIAGNOSIS — Z7982 Long term (current) use of aspirin: Secondary | ICD-10-CM | POA: Diagnosis not present

## 2019-09-24 DIAGNOSIS — C773 Secondary and unspecified malignant neoplasm of axilla and upper limb lymph nodes: Secondary | ICD-10-CM | POA: Diagnosis not present

## 2019-09-24 DIAGNOSIS — Z7952 Long term (current) use of systemic steroids: Secondary | ICD-10-CM | POA: Diagnosis not present

## 2019-09-24 LAB — COMPREHENSIVE METABOLIC PANEL
ALT: 12 U/L (ref 0–44)
AST: 13 U/L — ABNORMAL LOW (ref 15–41)
Albumin: 3.1 g/dL — ABNORMAL LOW (ref 3.5–5.0)
Alkaline Phosphatase: 75 U/L (ref 38–126)
Anion gap: 9 (ref 5–15)
BUN: 8 mg/dL (ref 8–23)
CO2: 25 mmol/L (ref 22–32)
Calcium: 8.1 mg/dL — ABNORMAL LOW (ref 8.9–10.3)
Chloride: 110 mmol/L (ref 98–111)
Creatinine, Ser: 0.69 mg/dL (ref 0.44–1.00)
GFR calc Af Amer: >60 mL/min (ref >60)
GFR calc non Af Amer: >60 mL/min (ref >60)
Glucose, Bld: 114 mg/dL — ABNORMAL HIGH (ref 70–99)
Potassium: 3.7 mmol/L (ref 3.5–5.1)
Sodium: 144 mmol/L (ref 135–145)
Total Bilirubin: <0.2 mg/dL — ABNORMAL LOW (ref 0.3–1.2)
Total Protein: 5.4 g/dL — ABNORMAL LOW (ref 6.5–8.1)

## 2019-09-24 LAB — CBC WITH DIFFERENTIAL/PLATELET
Abs Immature Granulocytes: 0.17 10*3/uL — ABNORMAL HIGH (ref 0.00–0.07)
Basophils Absolute: 0 10*3/uL (ref 0.0–0.1)
Basophils Relative: 0 %
Eosinophils Absolute: 0 10*3/uL (ref 0.0–0.5)
Eosinophils Relative: 0 %
HCT: 28 % — ABNORMAL LOW (ref 36.0–46.0)
Hemoglobin: 8.9 g/dL — ABNORMAL LOW (ref 12.0–15.0)
Immature Granulocytes: 3 %
Lymphocytes Relative: 3 %
Lymphs Abs: 0.2 10*3/uL — ABNORMAL LOW (ref 0.7–4.0)
MCH: 31.4 pg (ref 26.0–34.0)
MCHC: 31.8 g/dL (ref 30.0–36.0)
MCV: 98.9 fL (ref 80.0–100.0)
Monocytes Absolute: 0.5 10*3/uL (ref 0.1–1.0)
Monocytes Relative: 10 %
Neutro Abs: 4.5 10*3/uL (ref 1.7–7.7)
Neutrophils Relative %: 84 %
Platelets: 118 10*3/uL — ABNORMAL LOW (ref 150–400)
RBC: 2.83 MIL/uL — ABNORMAL LOW (ref 3.87–5.11)
RDW: 17.6 % — ABNORMAL HIGH (ref 11.5–15.5)
WBC: 5.4 10*3/uL (ref 4.0–10.5)
nRBC: 0.7 % — ABNORMAL HIGH (ref 0.0–0.2)

## 2019-09-24 MED ORDER — DEXAMETHASONE SODIUM PHOSPHATE 10 MG/ML IJ SOLN
INTRAMUSCULAR | Status: AC
  Filled 2019-09-24: qty 1

## 2019-09-24 MED ORDER — SODIUM CHLORIDE 0.9% FLUSH
10.0000 mL | INTRAVENOUS | Status: DC | PRN
  Filled 2019-09-24: qty 10

## 2019-09-24 MED ORDER — FAMOTIDINE IN NACL 20-0.9 MG/50ML-% IV SOLN
INTRAVENOUS | Status: AC
  Filled 2019-09-24: qty 50

## 2019-09-24 MED ORDER — PALONOSETRON HCL INJECTION 0.25 MG/5ML
0.2500 mg | Freq: Once | INTRAVENOUS | Status: AC
  Administered 2019-09-24: 0.25 mg via INTRAVENOUS

## 2019-09-24 MED ORDER — DEXAMETHASONE SODIUM PHOSPHATE 10 MG/ML IJ SOLN
10.0000 mg | Freq: Once | INTRAMUSCULAR | Status: AC
  Administered 2019-09-24: 10 mg via INTRAVENOUS

## 2019-09-24 MED ORDER — HEPARIN SOD (PORK) LOCK FLUSH 100 UNIT/ML IV SOLN
500.0000 [IU] | Freq: Once | INTRAVENOUS | Status: AC | PRN
  Administered 2019-09-24: 500 [IU]
  Filled 2019-09-24: qty 5

## 2019-09-24 MED ORDER — SODIUM CHLORIDE 0.9 % IV SOLN
Freq: Once | INTRAVENOUS | Status: AC
  Filled 2019-09-24: qty 250

## 2019-09-24 MED ORDER — PALONOSETRON HCL INJECTION 0.25 MG/5ML
INTRAVENOUS | Status: AC
  Filled 2019-09-24: qty 5

## 2019-09-24 MED ORDER — SODIUM CHLORIDE 0.9 % IV SOLN
80.0000 mg/m2 | Freq: Once | INTRAVENOUS | Status: AC
  Administered 2019-09-24: 150 mg via INTRAVENOUS
  Filled 2019-09-24: qty 25

## 2019-09-24 MED ORDER — FAMOTIDINE IN NACL 20-0.9 MG/50ML-% IV SOLN
20.0000 mg | Freq: Once | INTRAVENOUS | Status: AC
  Administered 2019-09-24: 20 mg via INTRAVENOUS

## 2019-09-24 MED ORDER — DIPHENHYDRAMINE HCL 50 MG/ML IJ SOLN
50.0000 mg | Freq: Once | INTRAMUSCULAR | Status: AC
  Administered 2019-09-24: 50 mg via INTRAVENOUS

## 2019-09-24 MED ORDER — SODIUM CHLORIDE 0.9 % IV SOLN
182.8000 mg | Freq: Once | INTRAVENOUS | Status: AC
  Administered 2019-09-24: 180 mg via INTRAVENOUS
  Filled 2019-09-24: qty 18

## 2019-09-24 MED ORDER — HEPARIN SOD (PORK) LOCK FLUSH 100 UNIT/ML IV SOLN
500.0000 [IU] | Freq: Once | INTRAVENOUS | Status: DC
  Filled 2019-09-24: qty 5

## 2019-09-24 MED ORDER — SODIUM CHLORIDE 0.9 % IV SOLN
150.0000 mg | Freq: Once | INTRAVENOUS | Status: AC
  Administered 2019-09-24: 150 mg via INTRAVENOUS
  Filled 2019-09-24: qty 150

## 2019-09-24 MED ORDER — DIPHENHYDRAMINE HCL 50 MG/ML IJ SOLN
INTRAMUSCULAR | Status: AC
  Filled 2019-09-24: qty 1

## 2019-09-24 MED ORDER — SODIUM CHLORIDE 0.9% FLUSH
10.0000 mL | INTRAVENOUS | Status: DC | PRN
  Administered 2019-09-24: 10 mL
  Filled 2019-09-24: qty 10

## 2019-09-24 MED ORDER — SODIUM CHLORIDE 0.9 % IV SOLN: 10.0000 mg | Freq: Once | INTRAVENOUS | Status: DC

## 2019-09-24 NOTE — Patient Instructions (Signed)
[facility] Discharge Instructions for Patients Receiving Chemotherapy  Today you received the following chemotherapy agents Taxol and Carboplatin  To help prevent nausea and vomiting after your treatment, we encourage you to take your nausea medication as directed.    If you develop nausea and vomiting that is not controlled by your nausea medication, call the clinic.   BELOW ARE SYMPTOMS THAT SHOULD BE REPORTED IMMEDIATELY:  *FEVER GREATER THAN 100.5 F  *CHILLS WITH OR WITHOUT FEVER  NAUSEA AND VOMITING THAT IS NOT CONTROLLED WITH YOUR NAUSEA MEDICATION  *UNUSUAL SHORTNESS OF BREATH  *UNUSUAL BRUISING OR BLEEDING  TENDERNESS IN MOUTH AND THROAT WITH OR WITHOUT PRESENCE OF ULCERS  *URINARY PROBLEMS  *BOWEL PROBLEMS  UNUSUAL RASH Items with * indicate a potential emergency and should be followed up as soon as possible.  Feel free to call the clinic should you have any questions or concerns. The clinic phone number is [phone].  Please show the [facility] at check-in to the Emergency Department and triage nurse.  Paclitaxel (Taxol)  injection What is this medicine? PACLITAXEL (PAK li TAX el) is a chemotherapy drug. It targets fast dividing cells, like cancer cells, and causes these cells to die. This medicine is used to treat ovarian cancer, breast cancer, lung cancer, Kaposi's sarcoma, and other cancers. This medicine may be used for other purposes; ask your health care provider or pharmacist if you have questions. COMMON BRAND NAME(S): Onxol, Taxol What should I tell my health care provider before I take this medicine? They need to know if you have any of these conditions:  history of irregular heartbeat  liver disease  low blood counts, like low white cell, platelet, or red cell counts  lung or breathing disease, like asthma  tingling of the fingers or toes, or other nerve disorder  an unusual or allergic reaction to paclitaxel, alcohol,  polyoxyethylated castor oil, other chemotherapy, other medicines, foods, dyes, or preservatives  pregnant or trying to get pregnant  breast-feeding How should I use this medicine? This drug is given as an infusion into a vein. It is administered in a hospital or clinic by a specially trained health care professional. Talk to your pediatrician regarding the use of this medicine in children. Special care may be needed. Overdosage: If you think you have taken too much of this medicine contact a poison control center or emergency room at once. NOTE: This medicine is only for you. Do not share this medicine with others. What if I miss a dose? It is important not to miss your dose. Call your doctor or health care professional if you are unable to keep an appointment. What may interact with this medicine? Do not take this medicine with any of the following medications:  disulfiram  metronidazole This medicine may also interact with the following medications:  antiviral medicines for hepatitis, HIV or AIDS  certain antibiotics like erythromycin and clarithromycin  certain medicines for fungal infections like ketoconazole and itraconazole  certain medicines for seizures like carbamazepine, phenobarbital, phenytoin  gemfibrozil  nefazodone  rifampin  St. John's wort This list may not describe all possible interactions. Give your health care provider a list of all the medicines, herbs, non-prescription drugs, or dietary supplements you use. Also tell them if you smoke, drink alcohol, or use illegal drugs. Some items may interact with your medicine. What should I watch for while using this medicine? Your condition will be monitored carefully while you are receiving this medicine. You  will need important blood work done while you are taking this medicine. This medicine can cause serious allergic reactions. To reduce your risk you will need to take other medicine(s) before treatment with this  medicine. If you experience allergic reactions like skin rash, itching or hives, swelling of the face, lips, or tongue, tell your doctor or health care professional right away. In some cases, you may be given additional medicines to help with side effects. Follow all directions for their use. This drug may make you feel generally unwell. This is not uncommon, as chemotherapy can affect healthy cells as well as cancer cells. Report any side effects. Continue your course of treatment even though you feel ill unless your doctor tells you to stop. Call your doctor or health care professional for advice if you get a fever, chills or sore throat, or other symptoms of a cold or flu. Do not treat yourself. This drug decreases your body's ability to fight infections. Try to avoid being around people who are sick. This medicine may increase your risk to bruise or bleed. Call your doctor or health care professional if you notice any unusual bleeding. Be careful brushing and flossing your teeth or using a toothpick because you may get an infection or bleed more easily. If you have any dental work done, tell your dentist you are receiving this medicine. Avoid taking products that contain aspirin, acetaminophen, ibuprofen, naproxen, or ketoprofen unless instructed by your doctor. These medicines may hide a fever. Do not become pregnant while taking this medicine. Women should inform their doctor if they wish to become pregnant or think they might be pregnant. There is a potential for serious side effects to an unborn child. Talk to your health care professional or pharmacist for more information. Do not breast-feed an infant while taking this medicine. Men are advised not to father a child while receiving this medicine. This product may contain alcohol. Ask your pharmacist or healthcare provider if this medicine contains alcohol. Be sure to tell all healthcare providers you are taking this medicine. Certain medicines,  like metronidazole and disulfiram, can cause an unpleasant reaction when taken with alcohol. The reaction includes flushing, headache, nausea, vomiting, sweating, and increased thirst. The reaction can last from 30 minutes to several hours. What side effects may I notice from receiving this medicine? Side effects that you should report to your doctor or health care professional as soon as possible:  allergic reactions like skin rash, itching or hives, swelling of the face, lips, or tongue  breathing problems  changes in vision  fast, irregular heartbeat  high or low blood pressure  mouth sores  pain, tingling, numbness in the hands or feet  signs of decreased platelets or bleeding - bruising, pinpoint red spots on the skin, black, tarry stools, blood in the urine  signs of decreased red blood cells - unusually weak or tired, feeling faint or lightheaded, falls  signs of infection - fever or chills, cough, sore throat, pain or difficulty passing urine  signs and symptoms of liver injury like dark yellow or brown urine; general ill feeling or flu-like symptoms; light-colored stools; loss of appetite; nausea; right upper belly pain; unusually weak or tired; yellowing of the eyes or skin  swelling of the ankles, feet, hands  unusually slow heartbeat Side effects that usually do not require medical attention (report to your doctor or health care professional if they continue or are bothersome):  diarrhea  hair loss  loss of appetite  muscle or joint pain  nausea, vomiting  pain, redness, or irritation at site where injected  tiredness This list may not describe all possible side effects. Call your doctor for medical advice about side effects. You may report side effects to FDA at 1-800-FDA-1088. Where should I keep my medicine? This drug is given in a hospital or clinic and will not be stored at home. NOTE: This sheet is a summary. It may not cover all possible information.  If you have questions about this medicine, talk to your doctor, pharmacist, or health care provider.  2020 [employer]/Gold Standard (2017-02-21 13:14:55)  Carboplatin injection What is this medicine? CARBOPLATIN (KAR boe pla tin) is a chemotherapy drug. It targets fast dividing cells, like cancer cells, and causes these cells to die. This medicine is used to treat ovarian cancer and many other cancers. This medicine may be used for other purposes; ask your health care provider or pharmacist if you have questions. COMMON BRAND NAME(S): Paraplatin What should I tell my health care provider before I take this medicine? They need to know if you have any of these conditions:  blood disorders  hearing problems  kidney disease  recent or ongoing radiation therapy  an unusual or allergic reaction to carboplatin, cisplatin, other chemotherapy, other medicines, foods, dyes, or preservatives  pregnant or trying to get pregnant  breast-feeding How should I use this medicine? This drug is usually given as an infusion into a vein. It is administered in a hospital or clinic by a specially trained health care professional. Talk to your pediatrician regarding the use of this medicine in children. Special care may be needed. Overdosage: If you think you have taken too much of this medicine contact a poison control center or emergency room at once. NOTE: This medicine is only for you. Do not share this medicine with others. What if I miss a dose? It is important not to miss a dose. Call your doctor or health care professional if you are unable to keep an appointment. What may interact with this medicine?  medicines for seizures  medicines to increase blood counts like filgrastim, pegfilgrastim, sargramostim  some antibiotics like amikacin, gentamicin, neomycin, streptomycin, tobramycin  vaccines Talk to your doctor or health care professional before taking any of these  medicines:  acetaminophen  aspirin  ibuprofen  ketoprofen  naproxen This list may not describe all possible interactions. Give your health care provider a list of all the medicines, herbs, non-prescription drugs, or dietary supplements you use. Also tell them if you smoke, drink alcohol, or use illegal drugs. Some items may interact with your medicine. What should I watch for while using this medicine? Your condition will be monitored carefully while you are receiving this medicine. You will need important blood work done while you are taking this medicine. This drug may make you feel generally unwell. This is not uncommon, as chemotherapy can affect healthy cells as well as cancer cells. Report any side effects. Continue your course of treatment even though you feel ill unless your doctor tells you to stop. In some cases, you may be given additional medicines to help with side effects. Follow all directions for their use. Call your doctor or health care professional for advice if you get a fever, chills or sore throat, or other symptoms of a cold or flu. Do not treat yourself. This drug decreases your body's ability to fight infections. Try to avoid being around people who are sick. This medicine may increase your  risk to bruise or bleed. Call your doctor or health care professional if you notice any unusual bleeding. Be careful brushing and flossing your teeth or using a toothpick because you may get an infection or bleed more easily. If you have any dental work done, tell your dentist you are receiving this medicine. Avoid taking products that contain aspirin, acetaminophen, ibuprofen, naproxen, or ketoprofen unless instructed by your doctor. These medicines may hide a fever. Do not become pregnant while taking this medicine. Women should inform their doctor if they wish to become pregnant or think they might be pregnant. There is a potential for serious side effects to an unborn child. Talk  to your health care professional or pharmacist for more information. Do not breast-feed an infant while taking this medicine. What side effects may I notice from receiving this medicine? Side effects that you should report to your doctor or health care professional as soon as possible:  allergic reactions like skin rash, itching or hives, swelling of the face, lips, or tongue  signs of infection - fever or chills, cough, sore throat, pain or difficulty passing urine  signs of decreased platelets or bleeding - bruising, pinpoint red spots on the skin, black, tarry stools, nosebleeds  signs of decreased red blood cells - unusually weak or tired, fainting spells, lightheadedness  breathing problems  changes in hearing  changes in vision  chest pain  high blood pressure  low blood counts - This drug may decrease the number of white blood cells, red blood cells and platelets. You may be at increased risk for infections and bleeding.  nausea and vomiting  pain, swelling, redness or irritation at the injection site  pain, tingling, numbness in the hands or feet  problems with balance, talking, walking  trouble passing urine or change in the amount of urine Side effects that usually do not require medical attention (report to your doctor or health care professional if they continue or are bothersome):  hair loss  loss of appetite  metallic taste in the mouth or changes in taste This list may not describe all possible side effects. Call your doctor for medical advice about side effects. You may report side effects to FDA at 1-800-FDA-1088. Where should I keep my medicine? This drug is given in a hospital or clinic and will not be stored at home. NOTE: This sheet is a summary. It may not cover all possible information. If you have questions about this medicine, talk to your doctor, pharmacist, or health care provider.  2020 [employer]/Gold Standard (2007-09-25 14:38:05)

## 2019-09-25 ENCOUNTER — Ambulatory Visit
Admission: RE | Admit: 2019-09-25 | Discharge: 2019-09-25 | Disposition: A | Discharge status: Home / Self Care | Payer: Medicare Other | Source: Ambulatory Visit | Attending: Adult Health | Admitting: Adult Health

## 2019-09-25 ENCOUNTER — Telehealth: Payer: Self-pay | Admitting: Adult Health

## 2019-09-25 DIAGNOSIS — C50512 Malignant neoplasm of lower-outer quadrant of left female breast: Secondary | ICD-10-CM

## 2019-09-25 DIAGNOSIS — C50912 Malignant neoplasm of unspecified site of left female breast: Secondary | ICD-10-CM | POA: Diagnosis not present

## 2019-09-25 NOTE — Progress Notes (Signed)
Pharmacist Chemotherapy Monitoring - Follow Up Assessment    I verify that I have reviewed each item in the below checklist:  . Regimen for the patient is scheduled for the appropriate day and plan matches scheduled date. Marland Kitchen Appropriate non-routine labs are ordered dependent on drug ordered. . If applicable, additional medications reviewed and ordered per protocol based on lifetime cumulative doses and/or treatment regimen.   Plan for follow-up and/or issues identified: No . I-vent associated with next due treatment: No . MD and/or nursing notified: No  Artis Buechele K 09/25/2019 11:54 AM

## 2019-09-25 NOTE — Telephone Encounter (Signed)
No 3/23 los. No changes made to pt's schedule.  

## 2019-09-30 NOTE — Progress Notes (Signed)
[facility]  [phone] [phone]     ID: [name] DOB: 01-31-1946  MR#: [id number]  CZY#:[id number]  Patient Care Team: Leeroy Cha, MD as PCP - General (Internal Medicine) Rockwell Germany, RN as Oncology Nurse Navigator Mauro Kaufmann, RN as Oncology Nurse Navigator Eppie Gibson, MD as Attending Physician (Radiation Oncology) Donnie Mesa, MD as Consulting Physician (General Surgery) Rozelle Caudle, Virgie Dad, MD as Consulting Physician (Oncology) Chauncey Cruel, MD OTHER MD:  CHIEF COMPLAINT: triple negative breast cancer  CURRENT TREATMENT: Neoadjuvant chemotherapy   INTERVAL HISTORY: Tacori returns today for follow up of her triple negative breast cancer.   She completed the earlier part of her chemotherapy with cyclophosphamide and doxorubicin and started on weekly Paclitaxel and carboplatin .  Today is day 1 week 2 of 12 weeks of planned treatment.  Since her last visit, she underwent left breast ultrasound on 09/25/2019 showing: decreased size of biopsy-proven malignancy within lower left breast; slightly decreased cortical thickening of known lymph node metastasis.   REVIEW OF SYSTEMS: Talyah is tolerating the carboplatin/paclitaxel "really good".  She has had no nausea, has a better appetite and a better sense of taste.  She did have slight nausea on day 6 which likely is not related to her chemo.  She finds that if she drinks coffee through a straw it taste better.  Her port is working well.  She has absolutely no peripheral neuropathy symptoms.  Overall she is pleased with the way the new chemotherapy is going so far.   HISTORY OF CURRENT ILLNESS: From the original intake note:  Julie Thompson had routine screening mammography on 06/21/2019 showing a possible abnormality in the left breast. She underwent left diagnostic mammography with tomography and left breast ultrasonography at [facility] on 07/01/2019  showing: breast density category C; either 2 adjacent masses or 1 complex dumbbell-shaped mass containing calcifications in the left breast at 6 o'clock, the largest of which measures 2.1 cm; chest wall invasion not excluded; 1-2 mm group of calcifications just anterior to the mass(es); single abnormal lymph node in the left axilla; vascular calcifications in anterior left breast.  Accordingly on 07/09/2019 she proceeded to biopsy of the left breast areas in question. The pathology from this procedure ([id number]) showed:  1. Left Breast, 5:30, posterior  - invasive ductal carcinoma with necrosis, grade 3  - Prognostic indicators significant for: estrogen receptor, 0% negative and progesterone receptor, 0% negative. Proliferation marker Ki67 at 60%. HER2 negative by immunohistochemistry (0). 2. Left Breast, 5:30, posterior  - invasive ductal carcinoma with necrosis, grade 3  - ductal carcinoma in situ, intermediate grade 3. Left Axilla, lymph node (1)  - metastatic carcinoma involving a lymph node  And additional biopsy of the anterior vascular calcifications in the left breast was performed on 07/10/2019. Pathology [id number]) showed: fibrocystic changes with calcifications; no malignancy identified.  This was felt to be concordant.  The patient's subsequent history is as detailed below.   PAST MEDICAL HISTORY: Past Medical History:  Diagnosis Date  . Asthma    no problems now  . Family history of basal cell carcinoma   . Family history of breast cancer   . Family history of colon cancer   . Family history of kidney cancer   . Family history of leukemia   . Family history of peritoneal cancer   . GERD (gastroesophageal reflux disease)   . H/O hiatal hernia   . Hyperlipidemia   . Hypothyroidism   .  IBS (irritable bowel syndrome)   . lt breast ca dx'd 07/10/19    PAST SURGICAL HISTORY: Past Surgical History:  Procedure Laterality Date  . ABDOMINAL HYSTERECTOMY     age 70  . BREAST  SURGERY    . DILATION AND CURETTAGE OF UTERUS     2 miscarriages age 39 & 6  . FLEXIBLE SIGMOIDOSCOPY N/A 10/22/2013   Procedure: FLEXIBLE SIGMOIDOSCOPY;  Surgeon: Garlan Fair, MD;  Location: [facility];  Service: Endoscopy;  Laterality: N/A;  . KNEE ARTHROSCOPY Left   . PORTACATH PLACEMENT Right 07/29/2019   Procedure: INSERTION PORT-A-CATH WITH ULTRASOUND;  Surgeon: Donnie Mesa, MD;  Location: [facility];  Service: General;  Laterality: Right;  . TONSILLECTOMY     age 4    FAMILY HISTORY: Family History  Problem Relation Age of Onset  . Breast cancer Maternal Aunt        dx. in her 34s  . Cancer Mother 22       peritoneal cancer, death certificate lists ovarian cancer  . Colon cancer Paternal Uncle        dx. late 25s or early 78s  . Basal cell carcinoma Sister 59       a second diagnosed in her 27s  . Kidney cancer Maternal Grandmother 65  . Diabetes Paternal Grandmother   . Heart Problems Paternal Grandmother   . Leukemia Paternal Uncle        dx. early 58s   Patient's father is currently living at age 49 as of 07-31-2019. Patient's mother died from ovarian cancer at age 78. She was diagnosed at age 84. The patient reports breast cancer in a maternal aunt, diagnosed in her 37's. The patient also noted colon cancer in a paternal uncle and kidney cancer in her maternal grandmother.  She has 1 sister.   GYNECOLOGIC HISTORY:  No LMP recorded. Patient has had a hysterectomy. Menarche: [age] years old Age at first live birth: 74 years old [place] P 2 LMP age 29 (with hysterectomy) Contraceptive: never used HRT used for 10-12 years  Hysterectomy? Yes, age 27 BSO? 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  Husband [name] is a retired Psychologist, prison and probation services. She lives at home with husband Butch. She has two children from a prior marriage. Son [name], age 16, works as a Games developer in  [place], [place]. Son [name], age 64, works as a Programme researcher, broadcasting/film/video in [place], [place]. The patient has 4 grandchildren and 4 great-grandchildren. She attends [employer].    ADVANCED DIRECTIVES: In the absence of any documentation to the contrary, the patient's spouse is their HCPOA.    HEALTH MAINTENANCE: Social History   Tobacco Use  . Smoking status: Never Smoker  . Smokeless tobacco: Never Used  Substance Use Topics  . Alcohol use: No  . Drug use: No     Colonoscopy: 09/2013, with CT virtual 11/2013  PAP: none on file, s/p hysterectomy  Bone density: 04/2001, -0.5   Allergies  Allergen Reactions  . Sulfa Antibiotics Rash  . Penicillins Hives    Current Outpatient Medications  Medication Sig Dispense Refill  . aspirin EC 81 MG tablet Take 81 mg by mouth daily.    Marland Kitchen atorvastatin (LIPITOR) 20 MG tablet Take 20 mg by mouth daily.    . Cholecalciferol (VITAMIN D) 2000 UNITS tablet Take 2,000 Units by mouth daily.    Marland Kitchen co-enzyme Q-10 30 MG  capsule Take 20 mg by mouth 3 (three) times daily.    Marland Kitchen esomeprazole (NEXIUM) 20 MG capsule Take 20 mg by mouth daily at 12 noon.    . famotidine (PEPCID) 20 MG tablet Take 40 mg by mouth 2 (two) times daily.    Marland Kitchen FIBER PO Take 1 tablet by mouth daily.    . fluconazole (DIFLUCAN) 200 MG tablet Take 1 tablet (200 mg total) by mouth daily. 30 tablet 0  . HYDROcodone-acetaminophen (NORCO/VICODIN) 5-325 MG tablet Take 1 tablet by mouth every 6 (six) hours as needed for moderate pain. 15 tablet 0  . levothyroxine (SYNTHROID) 50 MCG tablet Take 50 mcg by mouth daily.    Marland Kitchen lidocaine-prilocaine (EMLA) cream Apply 1 application topically as needed. 30 g 0  . loratadine (CLARITIN) 10 MG tablet Take 1 tablet (10 mg total) by mouth daily. 60 tablet 1  . LORazepam (ATIVAN) 0.5 MG tablet Take 1 tablet (0.5 mg total) by mouth at bedtime as needed (Nausea or vomiting). 30 tablet 0  . Multiple Vitamin (MULTIVITAMIN WITH MINERALS) TABS tablet Take 1 tablet  by mouth daily.    . nabumetone (RELAFEN) 500 MG tablet Take 500 mg by mouth 2 (two) times daily as needed.    . Omega-3 Fatty Acids (FISH OIL) 1200 MG CAPS Take 1,200 mg by mouth daily.    . ondansetron (ZOFRAN) 8 MG tablet Take 1 tablet (8 mg total) by mouth every 8 (eight) hours as needed for nausea or vomiting. May start no earlier than day 3 of a chemotherapy cycle. 20 tablet 4  . prochlorperazine (COMPAZINE) 10 MG tablet Take 1 tablet (10 mg total) by mouth every 6 (six) hours as needed (Nausea or vomiting). 30 tablet 1  . valACYclovir (VALTREX) 500 MG tablet Take 1 tablet (500 mg total) by mouth 2 (two) times daily. 180 tablet 0   No current facility-administered medications for this visit.    OBJECTIVE: White woman in no acute distress  Vitals:   10/01/19 1255  BP: 131/88  Pulse: 88  Resp: 18  Temp: 98.2 F (36.8 C)  SpO2: 100%     Body mass index is 35.49 kg/m.   Wt Readings from Last 3 Encounters:  10/01/19 181 lb 11.2 oz (82.4 kg)  09/24/19 186 lb 3.2 oz (84.5 kg)  09/17/19 181 lb 11.2 oz (82.4 kg)      ECOG FS:1 - Symptomatic but completely ambulatory  Sclerae unicteric, EOMs intact Wearing a mask No cervical or supraclavicular adenopathy Lungs no rales or rhonchi Heart regular rate and rhythm Abd soft, nontender, positive bowel sounds MSK no focal spinal tenderness, no upper extremity lymphedema Neuro: nonfocal, well oriented, appropriate affect Breasts: Deferred  LAB RESULTS:  CMP     Component Value Date/Time   NA 140 10/01/2019 1218   K 4.5 10/01/2019 1218   CL 106 10/01/2019 1218   CO2 25 10/01/2019 1218   GLUCOSE 97 10/01/2019 1218   BUN 14 10/01/2019 1218   CREATININE 0.80 10/01/2019 1218   CREATININE 0.94 07/17/2019 0823   CALCIUM 8.8 (L) 10/01/2019 1218   PROT 5.9 (L) 10/01/2019 1218   ALBUMIN 3.5 10/01/2019 1218   AST 15 10/01/2019 1218   AST 21 07/17/2019 0823   ALT 16 10/01/2019 1218   ALT 18 07/17/2019 0823   ALKPHOS 62 10/01/2019  1218   BILITOT 0.3 10/01/2019 1218   BILITOT 0.4 07/17/2019 0823   GFRNONAA >60 10/01/2019 [place] >60 07/17/2019 0626  GFRAA >60 10/01/2019 1218   GFRAA >60 07/17/2019 0823    No results found for: TOTALPROTELP, ALBUMINELP, A1GS, A2GS, BETS, BETA2SER, GAMS, MSPIKE, SPEI  Lab Results  Component Value Date   WBC 4.1 10/01/2019   NEUTROABS 3.5 10/01/2019   HGB 9.2 (L) 10/01/2019   HCT 28.5 (L) 10/01/2019   MCV 99.0 10/01/2019   PLT 222 10/01/2019    No results found for: LABCA2  No components found for: WNIOEV035  No results for input(s): INR in the last 168 hours.  No results found for: LABCA2  No results found for: KKX381  No results found for: WEX937  No results found for: JIR678  No results found for: CA2729  No components found for: HGQUANT  No results found for: CEA1 / No results found for: CEA1   No results found for: AFPTUMOR  No results found for: CHROMOGRNA  No results found for: KPAFRELGTCHN, LAMBDASER, KAPLAMBRATIO (kappa/lambda light chains)  No results found for: HGBA, HGBA2QUANT, HGBFQUANT, HGBSQUAN (Hemoglobinopathy evaluation)   No results found for: LDH  No results found for: IRON, TIBC, IRONPCTSAT (Iron and TIBC)  No results found for: FERRITIN  Urinalysis No results found for: COLORURINE, APPEARANCEUR, LABSPEC, PHURINE, GLUCOSEU, HGBUR, BILIRUBINUR, KETONESUR, PROTEINUR, UROBILINOGEN, NITRITE, LEUKOCYTESUR   STUDIES: US BREAST LTD UNI LEFT INC AXILLA  Result Date: 09/25/2019 CLINICAL DATA:  74 year old female for evaluation of LEFT breast cancer response to neoadjuvant therapy. EXAM: ULTRASOUND OF THE LEFT BREAST COMPARISON:  07/01/2019 ultrasound FINDINGS: Targeted ultrasound is performed, showing decrease in size of the 2 adjacent masses/biopsy-proven malignancy at the 6 o'clock position of the LEFT breast 6 cm from the nipple. The anterior mass measures 1 x 0.9 x 1.2 cm, previously 1.5 x 1.7 x 1.7 cm. The posterior mass  measures 1.7 x 0.6 x 1.6 cm, previously 2.1 x 1.4 x 1.6 cm Only 1 abnormal appearing lymph node is now identified, containing biopsy clip and with cortical thickening slightly decreased from the prior study. IMPRESSION: Decreased size of biopsy-proven malignancy within the LOWER LEFT breast. Slightly decreased cortical thickening of known lymph node metastasis. RECOMMENDATION: Treatment plan I have discussed the findings and recommendations with the patient. If applicable, a reminder letter will be sent to the patient regarding the next appointment. BI-RADS CATEGORY  6: Known biopsy-proven malignancy. Electronically Signed   By: Margarette Canada M.D.   On: 09/25/2019 09:21     ELIGIBLE FOR AVAILABLE RESEARCH PROTOCOL: no  ASSESSMENT: 74 y.o. [place] woman status post left breast lower outer quadrant biopsy 07/09/2019 for a clinical T2 N1, stage IIIB invasive ductal carcinoma, grade 3, triple negative, with an MIB-1 of 60%  (a) chest CT scan and bone scan 07/31/2019 showed no evidence of metastatic disease  (b) MRI biopsy of 2 additional areas in the left breast (central, 07/10/2019, and at 830 o'clock on 07/26/2019) are both benign and concordant  (c) biopsy of 2 suspicious areas in the right breast on 07/26/2019 (9:00 and lower outer quadrant) were benign and concordant   (1) neoadjuvant chemotherapy consisting of cyclophosphamide and doxorubicin in dose dense fashion x4 started 07/30/2019, completed 09/10/2019, followed by weekly carboplatin and paclitaxel x12 started 09/24/2019  (2) definitive surgery to follow with targeted axillary lymph node dissection  (3) adjuvant radiation to follow  (4) genetics testing 07/23/2019 through the STAT Breast cancer panel offered by [facility] found no deleterious mutations in ATM, BRCA1, BRCA2, CDH1, CHEK2, PALB2, PTEN, STK11 and TP53.  Additional testing through the Common Hereditary Cancers Panel offered  by [facility] also found no deleterious mutations in IPC, ATM,  AXIN2, BARD1, BMPR1A, BRCA1, BRCA2, BRIP1, CDH1, CDK4, CDKN2A (p14ARF), CDKN2A (p16INK4a), CHEK2, CTNNA1, DICER1, EPCAM (Deletion/duplication testing only), GREM1 (promoter region deletion/duplication testing only), KIT, MEN1, MLH1, MSH2, MSH3, MSH6, MUTYH, NBN, NF1, NHTL1, PALB2, PDGFRA, PMS2, POLD1, POLE, PTEN, RAD50, RAD51C, RAD51D, RNF43, SDHB, SDHC, SDHD, SMAD4, SMARCA4. STK11, TP53, TSC1, TSC2, and VHL.  The following genes were evaluated for sequence changes only: SDHA and HOXB13 c.251G>A variant only.   (a) 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    PLAN: Mylasia tolerated her first cycle of carboplatin and paclitaxel remarkably well.  She will proceed with treatment today and weekly.  At this stage we will follow her on an every other week basis until she gets her eighth treatment and then see her weekly from that point on.  She remains moderately anemic.  Hopefully this will improve with this lighter chemotherapy.  We again reviewed the concerns regarding peripheral neuropathy.  She will alert [place] if any numbness or tingling in the finger pads or toe pads were to develop.  Total encounter time 30 minutes.Sarajane Jews C. Shuntell Foody, MD 10/02/2019 8:50 AM Medical Oncology and Hematology [facility] [place], [place] [address] Tel. [phone]    Fax. [phone]   I, Wilburn Mylar, am acting as scribe for Dr. Virgie Dad. Hartley Wyke.  I, Lurline Del MD, have reviewed the above documentation for accuracy and completeness, and I agree with the above.    *Total Encounter Time as defined by the Centers for Medicare and Medicaid Services includes, in addition to the face-to-face time of a patient visit (documented in the note above) non-face-to-face time: obtaining and reviewing outside history, ordering and reviewing medications, tests or procedures, care coordination (communications with other health care professionals  or caregivers) and documentation in the medical record.

## 2019-10-01 ENCOUNTER — Inpatient Hospital Stay: Payer: Medicare Other

## 2019-10-01 ENCOUNTER — Inpatient Hospital Stay: Payer: Medicare Other | Admitting: Oncology

## 2019-10-01 ENCOUNTER — Encounter: Payer: Self-pay | Admitting: *Deleted

## 2019-10-01 ENCOUNTER — Other Ambulatory Visit: Payer: Self-pay

## 2019-10-01 VITALS — BP 131/88 | HR 88 | Temp 98.2°F | Resp 18 | Ht 60.0 in | Wt 181.7 lb

## 2019-10-01 DIAGNOSIS — D701 Agranulocytosis secondary to cancer chemotherapy: Secondary | ICD-10-CM | POA: Diagnosis not present

## 2019-10-01 DIAGNOSIS — Z171 Estrogen receptor negative status [ER-]: Secondary | ICD-10-CM | POA: Diagnosis not present

## 2019-10-01 DIAGNOSIS — Z791 Long term (current) use of non-steroidal anti-inflammatories (NSAID): Secondary | ICD-10-CM | POA: Diagnosis not present

## 2019-10-01 DIAGNOSIS — C50512 Malignant neoplasm of lower-outer quadrant of left female breast: Secondary | ICD-10-CM

## 2019-10-01 DIAGNOSIS — Z7982 Long term (current) use of aspirin: Secondary | ICD-10-CM | POA: Diagnosis not present

## 2019-10-01 DIAGNOSIS — Z5111 Encounter for antineoplastic chemotherapy: Secondary | ICD-10-CM | POA: Diagnosis not present

## 2019-10-01 DIAGNOSIS — C773 Secondary and unspecified malignant neoplasm of axilla and upper limb lymph nodes: Secondary | ICD-10-CM | POA: Diagnosis not present

## 2019-10-01 DIAGNOSIS — T451X5D Adverse effect of antineoplastic and immunosuppressive drugs, subsequent encounter: Secondary | ICD-10-CM | POA: Diagnosis not present

## 2019-10-01 DIAGNOSIS — Z90710 Acquired absence of both cervix and uterus: Secondary | ICD-10-CM | POA: Diagnosis not present

## 2019-10-01 DIAGNOSIS — Z7952 Long term (current) use of systemic steroids: Secondary | ICD-10-CM | POA: Diagnosis not present

## 2019-10-01 DIAGNOSIS — Z79899 Other long term (current) drug therapy: Secondary | ICD-10-CM | POA: Diagnosis not present

## 2019-10-01 LAB — COMPREHENSIVE METABOLIC PANEL
ALT: 16 U/L (ref 0–44)
AST: 15 U/L (ref 15–41)
Albumin: 3.5 g/dL (ref 3.5–5.0)
Alkaline Phosphatase: 62 U/L (ref 38–126)
Anion gap: 9 (ref 5–15)
BUN: 14 mg/dL (ref 8–23)
CO2: 25 mmol/L (ref 22–32)
Calcium: 8.8 mg/dL — ABNORMAL LOW (ref 8.9–10.3)
Chloride: 106 mmol/L (ref 98–111)
Creatinine, Ser: 0.8 mg/dL (ref 0.44–1.00)
GFR calc Af Amer: >60 mL/min (ref >60)
GFR calc non Af Amer: >60 mL/min (ref >60)
Glucose, Bld: 97 mg/dL (ref 70–99)
Potassium: 4.5 mmol/L (ref 3.5–5.1)
Sodium: 140 mmol/L (ref 135–145)
Total Bilirubin: 0.3 mg/dL (ref 0.3–1.2)
Total Protein: 5.9 g/dL — ABNORMAL LOW (ref 6.5–8.1)

## 2019-10-01 LAB — CBC WITH DIFFERENTIAL/PLATELET
Abs Immature Granulocytes: 0.03 10*3/uL (ref 0.00–0.07)
Basophils Absolute: 0 10*3/uL (ref 0.0–0.1)
Basophils Relative: 1 %
Eosinophils Absolute: 0 10*3/uL (ref 0.0–0.5)
Eosinophils Relative: 1 %
HCT: 28.5 % — ABNORMAL LOW (ref 36.0–46.0)
Hemoglobin: 9.2 g/dL — ABNORMAL LOW (ref 12.0–15.0)
Immature Granulocytes: 1 %
Lymphocytes Relative: 6 %
Lymphs Abs: 0.2 10*3/uL — ABNORMAL LOW (ref 0.7–4.0)
MCH: 31.9 pg (ref 26.0–34.0)
MCHC: 32.3 g/dL (ref 30.0–36.0)
MCV: 99 fL (ref 80.0–100.0)
Monocytes Absolute: 0.3 10*3/uL (ref 0.1–1.0)
Monocytes Relative: 7 %
Neutro Abs: 3.5 10*3/uL (ref 1.7–7.7)
Neutrophils Relative %: 84 %
Platelets: 222 10*3/uL (ref 150–400)
RBC: 2.88 MIL/uL — ABNORMAL LOW (ref 3.87–5.11)
RDW: 17.3 % — ABNORMAL HIGH (ref 11.5–15.5)
WBC: 4.1 10*3/uL (ref 4.0–10.5)
nRBC: 0.7 % — ABNORMAL HIGH (ref 0.0–0.2)

## 2019-10-01 MED ORDER — SODIUM CHLORIDE 0.9 % IV SOLN
182.8000 mg | Freq: Once | INTRAVENOUS | Status: AC
  Administered 2019-10-01: 17:00:00 180 mg via INTRAVENOUS
  Filled 2019-10-01: qty 18

## 2019-10-01 MED ORDER — SODIUM CHLORIDE 0.9 % IV SOLN
150.0000 mg | Freq: Once | INTRAVENOUS | Status: AC
  Administered 2019-10-01: 15:00:00 150 mg via INTRAVENOUS
  Filled 2019-10-01: qty 150

## 2019-10-01 MED ORDER — SODIUM CHLORIDE 0.9% FLUSH
10.0000 mL | INTRAVENOUS | Status: DC | PRN
  Administered 2019-10-01: 18:00:00 10 mL
  Filled 2019-10-01: qty 10

## 2019-10-01 MED ORDER — PALONOSETRON HCL INJECTION 0.25 MG/5ML
0.2500 mg | Freq: Once | INTRAVENOUS | Status: AC
  Administered 2019-10-01: 15:00:00 0.25 mg via INTRAVENOUS

## 2019-10-01 MED ORDER — DEXAMETHASONE SODIUM PHOSPHATE 10 MG/ML IJ SOLN
10.0000 mg | Freq: Once | INTRAMUSCULAR | Status: AC
  Administered 2019-10-01: 15:00:00 10 mg via INTRAVENOUS

## 2019-10-01 MED ORDER — SODIUM CHLORIDE 0.9 % IV SOLN
80.0000 mg/m2 | Freq: Once | INTRAVENOUS | Status: AC
  Administered 2019-10-01: 150 mg via INTRAVENOUS
  Filled 2019-10-01: qty 25

## 2019-10-01 MED ORDER — PALONOSETRON HCL INJECTION 0.25 MG/5ML
INTRAVENOUS | Status: AC
  Filled 2019-10-01: qty 5

## 2019-10-01 MED ORDER — FAMOTIDINE IN NACL 20-0.9 MG/50ML-% IV SOLN
20.0000 mg | Freq: Once | INTRAVENOUS | Status: AC
  Administered 2019-10-01: 15:00:00 20 mg via INTRAVENOUS

## 2019-10-01 MED ORDER — SODIUM CHLORIDE 0.9 % IV SOLN: 10.0000 mg | Freq: Once | INTRAVENOUS | Status: DC

## 2019-10-01 MED ORDER — SODIUM CHLORIDE 0.9 % IV SOLN
Freq: Once | INTRAVENOUS | Status: AC
  Filled 2019-10-01: qty 250

## 2019-10-01 MED ORDER — DEXAMETHASONE SODIUM PHOSPHATE 10 MG/ML IJ SOLN
INTRAMUSCULAR | Status: AC
  Filled 2019-10-01: qty 1

## 2019-10-01 MED ORDER — DIPHENHYDRAMINE HCL 50 MG/ML IJ SOLN
INTRAMUSCULAR | Status: AC
  Filled 2019-10-01: qty 1

## 2019-10-01 MED ORDER — DIPHENHYDRAMINE HCL 50 MG/ML IJ SOLN
50.0000 mg | Freq: Once | INTRAMUSCULAR | Status: AC
  Administered 2019-10-01: 15:00:00 50 mg via INTRAVENOUS

## 2019-10-01 MED ORDER — FAMOTIDINE IN NACL 20-0.9 MG/50ML-% IV SOLN
INTRAVENOUS | Status: AC
  Filled 2019-10-01: qty 50

## 2019-10-01 MED ORDER — HEPARIN SOD (PORK) LOCK FLUSH 100 UNIT/ML IV SOLN
500.0000 [IU] | Freq: Once | INTRAVENOUS | Status: AC | PRN
  Administered 2019-10-01: 18:00:00 500 [IU]
  Filled 2019-10-01: qty 5

## 2019-10-01 NOTE — Patient Instructions (Signed)
[facility] Discharge Instructions for Patients Receiving Chemotherapy  Today you received the following chemotherapy agents: Taxol/Carbo  To help prevent nausea and vomiting after your treatment, we encourage you to take your nausea medication as directed.   If you develop nausea and vomiting that is not controlled by your nausea medication, call the clinic.   BELOW ARE SYMPTOMS THAT SHOULD BE REPORTED IMMEDIATELY:  *FEVER GREATER THAN 100.5 F  *CHILLS WITH OR WITHOUT FEVER  NAUSEA AND VOMITING THAT IS NOT CONTROLLED WITH YOUR NAUSEA MEDICATION  *UNUSUAL SHORTNESS OF BREATH  *UNUSUAL BRUISING OR BLEEDING  TENDERNESS IN MOUTH AND THROAT WITH OR WITHOUT PRESENCE OF ULCERS  *URINARY PROBLEMS  *BOWEL PROBLEMS  UNUSUAL RASH Items with * indicate a potential emergency and should be followed up as soon as possible.  Feel free to call the clinic should you have any questions or concerns. The clinic phone number is [phone].  Please show the CHEMO ALERT CARD at check-in to the Emergency Department and triage nurse.   

## 2019-10-02 ENCOUNTER — Telehealth: Payer: Self-pay | Admitting: Oncology

## 2019-10-02 NOTE — Progress Notes (Signed)
Pharmacist Chemotherapy Monitoring - Follow Up Assessment    I verify that I have reviewed each item in the below checklist:  . Regimen for the patient is scheduled for the appropriate day and plan matches scheduled date. Marland Kitchen Appropriate non-routine labs are ordered dependent on drug ordered. . If applicable, additional medications reviewed and ordered per protocol based on lifetime cumulative doses and/or treatment regimen.   Plan for follow-up and/or issues identified: No . I-vent associated with next due treatment: No . MD and/or nursing notified: No  Julie Thompson K 10/02/2019 8:36 AM

## 2019-10-02 NOTE — Telephone Encounter (Signed)
No 3/30 los. No changes made to pt's schedule.

## 2019-10-08 ENCOUNTER — Inpatient Hospital Stay: Payer: Medicare Other

## 2019-10-08 ENCOUNTER — Other Ambulatory Visit: Payer: Self-pay

## 2019-10-08 ENCOUNTER — Inpatient Hospital Stay: Payer: Medicare Other | Attending: Oncology

## 2019-10-08 VITALS — BP 142/42 | HR 97 | Temp 99.1°F | Resp 18 | Wt 184.5 lb

## 2019-10-08 DIAGNOSIS — Z8049 Family history of malignant neoplasm of other genital organs: Secondary | ICD-10-CM | POA: Insufficient documentation

## 2019-10-08 DIAGNOSIS — Z791 Long term (current) use of non-steroidal anti-inflammatories (NSAID): Secondary | ICD-10-CM | POA: Diagnosis not present

## 2019-10-08 DIAGNOSIS — Z833 Family history of diabetes mellitus: Secondary | ICD-10-CM | POA: Diagnosis not present

## 2019-10-08 DIAGNOSIS — E039 Hypothyroidism, unspecified: Secondary | ICD-10-CM | POA: Insufficient documentation

## 2019-10-08 DIAGNOSIS — Z803 Family history of malignant neoplasm of breast: Secondary | ICD-10-CM | POA: Diagnosis not present

## 2019-10-08 DIAGNOSIS — C50512 Malignant neoplasm of lower-outer quadrant of left female breast: Secondary | ICD-10-CM | POA: Diagnosis not present

## 2019-10-08 DIAGNOSIS — K589 Irritable bowel syndrome without diarrhea: Secondary | ICD-10-CM | POA: Diagnosis not present

## 2019-10-08 DIAGNOSIS — Z806 Family history of leukemia: Secondary | ICD-10-CM | POA: Diagnosis not present

## 2019-10-08 DIAGNOSIS — R42 Dizziness and giddiness: Secondary | ICD-10-CM | POA: Diagnosis not present

## 2019-10-08 DIAGNOSIS — Z8349 Family history of other endocrine, nutritional and metabolic diseases: Secondary | ICD-10-CM | POA: Diagnosis not present

## 2019-10-08 DIAGNOSIS — Z79899 Other long term (current) drug therapy: Secondary | ICD-10-CM | POA: Diagnosis not present

## 2019-10-08 DIAGNOSIS — Z800 Family history of malignant neoplasm of digestive organs: Secondary | ICD-10-CM | POA: Diagnosis not present

## 2019-10-08 DIAGNOSIS — Z171 Estrogen receptor negative status [ER-]: Secondary | ICD-10-CM | POA: Insufficient documentation

## 2019-10-08 DIAGNOSIS — C773 Secondary and unspecified malignant neoplasm of axilla and upper limb lymph nodes: Secondary | ICD-10-CM | POA: Insufficient documentation

## 2019-10-08 DIAGNOSIS — J45909 Unspecified asthma, uncomplicated: Secondary | ICD-10-CM | POA: Insufficient documentation

## 2019-10-08 DIAGNOSIS — Z5111 Encounter for antineoplastic chemotherapy: Secondary | ICD-10-CM | POA: Diagnosis not present

## 2019-10-08 DIAGNOSIS — Z7982 Long term (current) use of aspirin: Secondary | ICD-10-CM | POA: Insufficient documentation

## 2019-10-08 DIAGNOSIS — Z808 Family history of malignant neoplasm of other organs or systems: Secondary | ICD-10-CM | POA: Diagnosis not present

## 2019-10-08 DIAGNOSIS — Z90710 Acquired absence of both cervix and uterus: Secondary | ICD-10-CM | POA: Diagnosis not present

## 2019-10-08 DIAGNOSIS — Z8051 Family history of malignant neoplasm of kidney: Secondary | ICD-10-CM | POA: Insufficient documentation

## 2019-10-08 DIAGNOSIS — D649 Anemia, unspecified: Secondary | ICD-10-CM | POA: Insufficient documentation

## 2019-10-08 LAB — CBC WITH DIFFERENTIAL/PLATELET
Abs Immature Granulocytes: 0.03 10*3/uL (ref 0.00–0.07)
Basophils Absolute: 0 10*3/uL (ref 0.0–0.1)
Basophils Relative: 1 %
Eosinophils Absolute: 0 10*3/uL (ref 0.0–0.5)
Eosinophils Relative: 1 %
HCT: 26.8 % — ABNORMAL LOW (ref 36.0–46.0)
Hemoglobin: 8.6 g/dL — ABNORMAL LOW (ref 12.0–15.0)
Immature Granulocytes: 1 %
Lymphocytes Relative: 9 %
Lymphs Abs: 0.2 10*3/uL — ABNORMAL LOW (ref 0.7–4.0)
MCH: 32.3 pg (ref 26.0–34.0)
MCHC: 32.1 g/dL (ref 30.0–36.0)
MCV: 100.8 fL — ABNORMAL HIGH (ref 80.0–100.0)
Monocytes Absolute: 0.3 10*3/uL (ref 0.1–1.0)
Monocytes Relative: 13 %
Neutro Abs: 1.6 10*3/uL — ABNORMAL LOW (ref 1.7–7.7)
Neutrophils Relative %: 75 %
Platelets: 166 10*3/uL (ref 150–400)
RBC: 2.66 MIL/uL — ABNORMAL LOW (ref 3.87–5.11)
RDW: 18.6 % — ABNORMAL HIGH (ref 11.5–15.5)
WBC: 2.1 10*3/uL — ABNORMAL LOW (ref 4.0–10.5)
nRBC: 3.9 % — ABNORMAL HIGH (ref 0.0–0.2)

## 2019-10-08 LAB — COMPREHENSIVE METABOLIC PANEL
ALT: 15 U/L (ref 0–44)
AST: 15 U/L (ref 15–41)
Albumin: 3.3 g/dL — ABNORMAL LOW (ref 3.5–5.0)
Alkaline Phosphatase: 65 U/L (ref 38–126)
Anion gap: 10 (ref 5–15)
BUN: 13 mg/dL (ref 8–23)
CO2: 23 mmol/L (ref 22–32)
Calcium: 8.6 mg/dL — ABNORMAL LOW (ref 8.9–10.3)
Chloride: 111 mmol/L (ref 98–111)
Creatinine, Ser: 0.73 mg/dL (ref 0.44–1.00)
GFR calc Af Amer: >60 mL/min (ref >60)
GFR calc non Af Amer: >60 mL/min (ref >60)
Glucose, Bld: 111 mg/dL — ABNORMAL HIGH (ref 70–99)
Potassium: 3.8 mmol/L (ref 3.5–5.1)
Sodium: 144 mmol/L (ref 135–145)
Total Bilirubin: 0.3 mg/dL (ref 0.3–1.2)
Total Protein: 5.7 g/dL — ABNORMAL LOW (ref 6.5–8.1)

## 2019-10-08 MED ORDER — SODIUM CHLORIDE 0.9 % IV SOLN
Freq: Once | INTRAVENOUS | Status: AC
  Filled 2019-10-08: qty 250

## 2019-10-08 MED ORDER — PALONOSETRON HCL INJECTION 0.25 MG/5ML
0.2500 mg | Freq: Once | INTRAVENOUS | Status: AC
  Administered 2019-10-08: 10:00:00 0.25 mg via INTRAVENOUS

## 2019-10-08 MED ORDER — DIPHENHYDRAMINE HCL 50 MG/ML IJ SOLN
INTRAMUSCULAR | Status: AC
  Filled 2019-10-08: qty 1

## 2019-10-08 MED ORDER — SODIUM CHLORIDE 0.9 % IV SOLN
150.0000 mg | Freq: Once | INTRAVENOUS | Status: AC
  Administered 2019-10-08: 10:00:00 150 mg via INTRAVENOUS
  Filled 2019-10-08: qty 5

## 2019-10-08 MED ORDER — HEPARIN SOD (PORK) LOCK FLUSH 100 UNIT/ML IV SOLN
500.0000 [IU] | Freq: Once | INTRAVENOUS | Status: DC
  Filled 2019-10-08: qty 5

## 2019-10-08 MED ORDER — SODIUM CHLORIDE 0.9% FLUSH
10.0000 mL | INTRAVENOUS | Status: DC | PRN
  Administered 2019-10-08: 10 mL
  Filled 2019-10-08: qty 10

## 2019-10-08 MED ORDER — SODIUM CHLORIDE 0.9 % IV SOLN
10.0000 mg | Freq: Once | INTRAVENOUS | Status: AC
  Administered 2019-10-08: 11:00:00 10 mg via INTRAVENOUS
  Filled 2019-10-08: qty 10

## 2019-10-08 MED ORDER — PALONOSETRON HCL INJECTION 0.25 MG/5ML
INTRAVENOUS | Status: AC
  Filled 2019-10-08: qty 5

## 2019-10-08 MED ORDER — HEPARIN SOD (PORK) LOCK FLUSH 100 UNIT/ML IV SOLN
500.0000 [IU] | Freq: Once | INTRAVENOUS | Status: AC | PRN
  Administered 2019-10-08: 500 [IU]
  Filled 2019-10-08: qty 5

## 2019-10-08 MED ORDER — FAMOTIDINE IN NACL 20-0.9 MG/50ML-% IV SOLN
INTRAVENOUS | Status: AC
  Filled 2019-10-08: qty 50

## 2019-10-08 MED ORDER — SODIUM CHLORIDE 0.9 % IV SOLN
182.8000 mg | Freq: Once | INTRAVENOUS | Status: AC
  Administered 2019-10-08: 13:00:00 180 mg via INTRAVENOUS
  Filled 2019-10-08: qty 18

## 2019-10-08 MED ORDER — SODIUM CHLORIDE 0.9 % IV SOLN
80.0000 mg/m2 | Freq: Once | INTRAVENOUS | Status: AC
  Administered 2019-10-08: 11:00:00 150 mg via INTRAVENOUS
  Filled 2019-10-08: qty 25

## 2019-10-08 MED ORDER — SODIUM CHLORIDE 0.9% FLUSH
10.0000 mL | Freq: Once | INTRAVENOUS | Status: AC
  Administered 2019-10-08: 10 mL via INTRAVENOUS
  Filled 2019-10-08: qty 10

## 2019-10-08 MED ORDER — DIPHENHYDRAMINE HCL 50 MG/ML IJ SOLN
50.0000 mg | Freq: Once | INTRAMUSCULAR | Status: AC
  Administered 2019-10-08: 10:00:00 50 mg via INTRAVENOUS

## 2019-10-08 MED ORDER — FAMOTIDINE IN NACL 20-0.9 MG/50ML-% IV SOLN
20.0000 mg | Freq: Once | INTRAVENOUS | Status: AC
  Administered 2019-10-08: 20 mg via INTRAVENOUS

## 2019-10-08 NOTE — Patient Instructions (Signed)

## 2019-10-08 NOTE — Patient Instructions (Signed)
[facility] Discharge Instructions for Patients Receiving Chemotherapy  Today you received the following chemotherapy agents: Taxol/Carbo  To help prevent nausea and vomiting after your treatment, we encourage you to take your nausea medication as directed.   If you develop nausea and vomiting that is not controlled by your nausea medication, call the clinic.   BELOW ARE SYMPTOMS THAT SHOULD BE REPORTED IMMEDIATELY:  *FEVER GREATER THAN 100.5 F  *CHILLS WITH OR WITHOUT FEVER  NAUSEA AND VOMITING THAT IS NOT CONTROLLED WITH YOUR NAUSEA MEDICATION  *UNUSUAL SHORTNESS OF BREATH  *UNUSUAL BRUISING OR BLEEDING  TENDERNESS IN MOUTH AND THROAT WITH OR WITHOUT PRESENCE OF ULCERS  *URINARY PROBLEMS  *BOWEL PROBLEMS  UNUSUAL RASH Items with * indicate a potential emergency and should be followed up as soon as possible.  Feel free to call the clinic should you have any questions or concerns. The clinic phone number is [phone].  Please show the CHEMO ALERT CARD at check-in to the Emergency Department and triage nurse.   

## 2019-10-09 NOTE — Progress Notes (Signed)
Pharmacist Chemotherapy Monitoring - Follow Up Assessment    I verify that I have reviewed each item in the below checklist:  . Regimen for the patient is scheduled for the appropriate day and plan matches scheduled date. Marland Kitchen Appropriate non-routine labs are ordered dependent on drug ordered. . If applicable, additional medications reviewed and ordered per protocol based on lifetime cumulative doses and/or treatment regimen.   Plan for follow-up and/or issues identified: No . I-vent associated with next due treatment: No . MD and/or nursing notified: No  Acquanetta Belling 10/09/2019 1:26 PM

## 2019-10-15 ENCOUNTER — Inpatient Hospital Stay: Payer: Medicare Other | Admitting: Adult Health

## 2019-10-15 ENCOUNTER — Encounter: Payer: Self-pay | Admitting: Adult Health

## 2019-10-15 ENCOUNTER — Other Ambulatory Visit: Payer: Self-pay

## 2019-10-15 ENCOUNTER — Inpatient Hospital Stay: Payer: Medicare Other

## 2019-10-15 VITALS — HR 100

## 2019-10-15 VITALS — BP 137/78 | HR 105 | Temp 98.3°F | Resp 18 | Ht 60.0 in | Wt 180.7 lb

## 2019-10-15 DIAGNOSIS — E039 Hypothyroidism, unspecified: Secondary | ICD-10-CM | POA: Diagnosis not present

## 2019-10-15 DIAGNOSIS — Z171 Estrogen receptor negative status [ER-]: Secondary | ICD-10-CM | POA: Diagnosis not present

## 2019-10-15 DIAGNOSIS — C50512 Malignant neoplasm of lower-outer quadrant of left female breast: Secondary | ICD-10-CM

## 2019-10-15 DIAGNOSIS — C773 Secondary and unspecified malignant neoplasm of axilla and upper limb lymph nodes: Secondary | ICD-10-CM | POA: Diagnosis not present

## 2019-10-15 DIAGNOSIS — Z8051 Family history of malignant neoplasm of kidney: Secondary | ICD-10-CM | POA: Diagnosis not present

## 2019-10-15 DIAGNOSIS — Z7982 Long term (current) use of aspirin: Secondary | ICD-10-CM | POA: Diagnosis not present

## 2019-10-15 DIAGNOSIS — Z803 Family history of malignant neoplasm of breast: Secondary | ICD-10-CM | POA: Diagnosis not present

## 2019-10-15 DIAGNOSIS — R42 Dizziness and giddiness: Secondary | ICD-10-CM | POA: Diagnosis not present

## 2019-10-15 DIAGNOSIS — Z791 Long term (current) use of non-steroidal anti-inflammatories (NSAID): Secondary | ICD-10-CM | POA: Diagnosis not present

## 2019-10-15 DIAGNOSIS — Z806 Family history of leukemia: Secondary | ICD-10-CM | POA: Diagnosis not present

## 2019-10-15 DIAGNOSIS — D649 Anemia, unspecified: Secondary | ICD-10-CM | POA: Diagnosis not present

## 2019-10-15 DIAGNOSIS — K589 Irritable bowel syndrome without diarrhea: Secondary | ICD-10-CM | POA: Diagnosis not present

## 2019-10-15 DIAGNOSIS — Z90710 Acquired absence of both cervix and uterus: Secondary | ICD-10-CM | POA: Diagnosis not present

## 2019-10-15 DIAGNOSIS — Z95828 Presence of other vascular implants and grafts: Secondary | ICD-10-CM

## 2019-10-15 DIAGNOSIS — J45909 Unspecified asthma, uncomplicated: Secondary | ICD-10-CM | POA: Diagnosis not present

## 2019-10-15 DIAGNOSIS — Z79899 Other long term (current) drug therapy: Secondary | ICD-10-CM | POA: Diagnosis not present

## 2019-10-15 DIAGNOSIS — Z5111 Encounter for antineoplastic chemotherapy: Secondary | ICD-10-CM | POA: Diagnosis not present

## 2019-10-15 LAB — COMPREHENSIVE METABOLIC PANEL
ALT: 21 U/L (ref 0–44)
AST: 18 U/L (ref 15–41)
Albumin: 3.4 g/dL — ABNORMAL LOW (ref 3.5–5.0)
Alkaline Phosphatase: 57 U/L (ref 38–126)
Anion gap: 10 (ref 5–15)
BUN: 12 mg/dL (ref 8–23)
CO2: 24 mmol/L (ref 22–32)
Calcium: 8.6 mg/dL — ABNORMAL LOW (ref 8.9–10.3)
Chloride: 110 mmol/L (ref 98–111)
Creatinine, Ser: 0.78 mg/dL (ref 0.44–1.00)
GFR calc Af Amer: >60 mL/min (ref >60)
GFR calc non Af Amer: >60 mL/min (ref >60)
Glucose, Bld: 114 mg/dL — ABNORMAL HIGH (ref 70–99)
Potassium: 3.8 mmol/L (ref 3.5–5.1)
Sodium: 144 mmol/L (ref 135–145)
Total Bilirubin: 0.3 mg/dL (ref 0.3–1.2)
Total Protein: 5.9 g/dL — ABNORMAL LOW (ref 6.5–8.1)

## 2019-10-15 LAB — CBC WITH DIFFERENTIAL/PLATELET
Abs Immature Granulocytes: 0.07 10*3/uL (ref 0.00–0.07)
Basophils Absolute: 0 10*3/uL (ref 0.0–0.1)
Basophils Relative: 1 %
Eosinophils Absolute: 0 10*3/uL (ref 0.0–0.5)
Eosinophils Relative: 0 %
HCT: 28.3 % — ABNORMAL LOW (ref 36.0–46.0)
Hemoglobin: 9.1 g/dL — ABNORMAL LOW (ref 12.0–15.0)
Immature Granulocytes: 2 %
Lymphocytes Relative: 6 %
Lymphs Abs: 0.2 10*3/uL — ABNORMAL LOW (ref 0.7–4.0)
MCH: 33.2 pg (ref 26.0–34.0)
MCHC: 32.2 g/dL (ref 30.0–36.0)
MCV: 103.3 fL — ABNORMAL HIGH (ref 80.0–100.0)
Monocytes Absolute: 0.4 10*3/uL (ref 0.1–1.0)
Monocytes Relative: 12 %
Neutro Abs: 2.9 10*3/uL (ref 1.7–7.7)
Neutrophils Relative %: 79 %
Platelets: 175 10*3/uL (ref 150–400)
RBC: 2.74 MIL/uL — ABNORMAL LOW (ref 3.87–5.11)
RDW: 20.2 % — ABNORMAL HIGH (ref 11.5–15.5)
WBC: 3.7 10*3/uL — ABNORMAL LOW (ref 4.0–10.5)
nRBC: 3 % — ABNORMAL HIGH (ref 0.0–0.2)

## 2019-10-15 MED ORDER — FAMOTIDINE IN NACL 20-0.9 MG/50ML-% IV SOLN
20.0000 mg | Freq: Once | INTRAVENOUS | Status: AC
  Administered 2019-10-15: 20 mg via INTRAVENOUS

## 2019-10-15 MED ORDER — PALONOSETRON HCL INJECTION 0.25 MG/5ML
0.2500 mg | Freq: Once | INTRAVENOUS | Status: AC
  Administered 2019-10-15: 0.25 mg via INTRAVENOUS

## 2019-10-15 MED ORDER — HEPARIN SOD (PORK) LOCK FLUSH 100 UNIT/ML IV SOLN
500.0000 [IU] | Freq: Once | INTRAVENOUS | Status: AC | PRN
  Administered 2019-10-15: 500 [IU]
  Filled 2019-10-15: qty 5

## 2019-10-15 MED ORDER — SODIUM CHLORIDE 0.9 % IV SOLN
80.0000 mg/m2 | Freq: Once | INTRAVENOUS | Status: AC
  Administered 2019-10-15: 12:00:00 150 mg via INTRAVENOUS
  Filled 2019-10-15: qty 25

## 2019-10-15 MED ORDER — SODIUM CHLORIDE 0.9 % IV SOLN
10.0000 mg | Freq: Once | INTRAVENOUS | Status: AC
  Administered 2019-10-15: 10 mg via INTRAVENOUS
  Filled 2019-10-15: qty 10

## 2019-10-15 MED ORDER — SODIUM CHLORIDE 0.9% FLUSH
10.0000 mL | Freq: Once | INTRAVENOUS | Status: AC
  Administered 2019-10-15: 10 mL
  Filled 2019-10-15: qty 10

## 2019-10-15 MED ORDER — DIPHENHYDRAMINE HCL 50 MG/ML IJ SOLN
INTRAMUSCULAR | Status: AC
  Filled 2019-10-15: qty 1

## 2019-10-15 MED ORDER — SODIUM CHLORIDE 0.9% FLUSH
10.0000 mL | INTRAVENOUS | Status: DC | PRN
  Administered 2019-10-15: 10 mL
  Filled 2019-10-15: qty 10

## 2019-10-15 MED ORDER — PALONOSETRON HCL INJECTION 0.25 MG/5ML
INTRAVENOUS | Status: AC
  Filled 2019-10-15: qty 5

## 2019-10-15 MED ORDER — SODIUM CHLORIDE 0.9 % IV SOLN
Freq: Once | INTRAVENOUS | Status: AC
  Filled 2019-10-15: qty 250

## 2019-10-15 MED ORDER — FAMOTIDINE IN NACL 20-0.9 MG/50ML-% IV SOLN
INTRAVENOUS | Status: AC
  Filled 2019-10-15: qty 50

## 2019-10-15 MED ORDER — DIPHENHYDRAMINE HCL 50 MG/ML IJ SOLN
50.0000 mg | Freq: Once | INTRAMUSCULAR | Status: AC
  Administered 2019-10-15: 50 mg via INTRAVENOUS

## 2019-10-15 MED ORDER — SODIUM CHLORIDE 0.9 % IV SOLN
180.0000 mg | Freq: Once | INTRAVENOUS | Status: AC
  Administered 2019-10-15: 180 mg via INTRAVENOUS
  Filled 2019-10-15: qty 18

## 2019-10-15 MED ORDER — SODIUM CHLORIDE 0.9 % IV SOLN
150.0000 mg | Freq: Once | INTRAVENOUS | Status: AC
  Administered 2019-10-15: 150 mg via INTRAVENOUS
  Filled 2019-10-15: qty 150

## 2019-10-15 NOTE — Progress Notes (Signed)
[facility]  [phone] [phone]     ID: Julie Thompson DOB: [birth year]  MR#: [id number]  ZHY#:[id number]  Patient Care Team: Leeroy Cha, MD as PCP - General (Internal Medicine) Rockwell Germany, RN as Oncology Nurse Navigator Mauro Kaufmann, RN as Oncology Nurse Navigator Eppie Gibson, MD as Attending Physician (Radiation Oncology) Donnie Mesa, MD as Consulting Physician (General Surgery) Magrinat, Virgie Dad, MD as Consulting Physician (Oncology) Scot Dock, NP OTHER MD:  CHIEF COMPLAINT: triple negative breast cancer  CURRENT TREATMENT: Neoadjuvant chemotherapy   INTERVAL HISTORY: Julie Thompson returns today for follow up of her triple negative breast cancer.   She completed the earlier part of her chemotherapy with cyclophosphamide and doxorubicin and started on weekly Paclitaxel and carboplatin .  Today is day 1 week 4 of 12 weekly doses of Paclitaxel and Carboplatin.  She notes she is feeling much better with the weekly chemotherapy.  She has not developed any numbness or tingling in the pads of her fingers or toes.  She has mild intermittent cramping in her fingers and feet, however it isn't particularly bothersome.    REVIEW OF SYSTEMS: Julie Thompson notes that she has an increased appetite.  She also notes that what she eats is digesting well.  She did experience a mild increase in nausea, however this resolved with antiemetics.  She is not sleeping as well as she used to.  She denies any fever, chills, chest pain, palpitations, cough, shortness of breath, headaches, bowel/bladder changes, vomiting, or any other concerns.  A detailed ROS was otherwise non contributory.    HISTORY OF CURRENT ILLNESS: From the original intake note:  Julie Thompson had routine screening mammography on 06/21/2019 showing a possible abnormality in the left breast. She underwent left diagnostic mammography with tomography and left breast  ultrasonography at [facility] on 07/01/2019 showing: breast density category C; either 2 adjacent masses or 1 complex dumbbell-shaped mass containing calcifications in the left breast at 6 o'clock, the largest of which measures 2.1 cm; chest wall invasion not excluded; 1-2 mm group of calcifications just anterior to the mass(es); single abnormal lymph node in the left axilla; vascular calcifications in anterior left breast.  Accordingly on 07/09/2019 she proceeded to biopsy of the left breast areas in question. The pathology from this procedure ([id number]) showed:  1. Left Breast, 5:30, posterior  - invasive ductal carcinoma with necrosis, grade 3  - Prognostic indicators significant for: estrogen receptor, 0% negative and progesterone receptor, 0% negative. Proliferation marker Ki67 at 60%. HER2 negative by immunohistochemistry (0). 2. Left Breast, 5:30, posterior  - invasive ductal carcinoma with necrosis, grade 3  - ductal carcinoma in situ, intermediate grade 3. Left Axilla, lymph node (1)  - metastatic carcinoma involving a lymph node  And additional biopsy of the anterior vascular calcifications in the left breast was performed on 07/10/2019. Pathology [id number]) showed: fibrocystic changes with calcifications; no malignancy identified.  This was felt to be concordant.  The patient's subsequent history is as detailed below.   PAST MEDICAL HISTORY: Past Medical History:  Diagnosis Date  . Asthma    no problems now  . Family history of basal cell carcinoma   . Family history of breast cancer   . Family history of colon cancer   . Family history of kidney cancer   . Family history of leukemia   . Family history of peritoneal cancer   . GERD (gastroesophageal reflux disease)   . H/O hiatal hernia   .  Hyperlipidemia   . Hypothyroidism   . IBS (irritable bowel syndrome)   . lt breast ca dx'd 07/10/19    PAST SURGICAL HISTORY: Past Surgical History:  Procedure Laterality Date    . ABDOMINAL HYSTERECTOMY     age 70  . BREAST SURGERY    . DILATION AND CURETTAGE OF UTERUS     2 miscarriages age 47 & 48  . FLEXIBLE SIGMOIDOSCOPY N/A 10/22/2013   Procedure: FLEXIBLE SIGMOIDOSCOPY;  Surgeon: Garlan Fair, MD;  Location: [facility];  Service: Endoscopy;  Laterality: N/A;  . KNEE ARTHROSCOPY Left   . PORTACATH PLACEMENT Right 07/29/2019   Procedure: INSERTION PORT-A-CATH WITH ULTRASOUND;  Surgeon: Donnie Mesa, MD;  Location: [facility];  Service: General;  Laterality: Right;  . TONSILLECTOMY     age 39    FAMILY HISTORY: Family History  Problem Relation Age of Onset  . Breast cancer Maternal Aunt        dx. in her 87s  . Cancer Mother 33       peritoneal cancer, death certificate lists ovarian cancer  . Colon cancer Paternal Uncle        dx. late 28s or early 45s  . Basal cell carcinoma Sister 1       a second diagnosed in her 41s  . Kidney cancer Maternal Grandmother 17  . Diabetes Paternal Grandmother   . Heart Problems Paternal Grandmother   . Leukemia Paternal Uncle        dx. early 87s   Patient's father is currently living at age 80 as of 08/27/2019. Patient's mother died from ovarian cancer at age 23. She was diagnosed at age 72. The patient reports breast cancer in a maternal aunt, diagnosed in her 97's. The patient also noted colon cancer in a paternal uncle and kidney cancer in her maternal grandmother.  She has 1 sister.   GYNECOLOGIC HISTORY:  No LMP recorded. Patient has had a hysterectomy. Menarche: [age] years old Age at first live birth: 74 years old [place] P 2 LMP age 25 (with hysterectomy) Contraceptive: never used HRT used for 10-12 years  Hysterectomy? Yes, age 67 BSO? 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  Husband Julie Saxon "Butch" Laurann Montana Sr. is a retired Psychologist, prison and probation services. She lives at home with husband Butch. She has two children from a prior  marriage. Son Julie Thompson, age 8, works as a Games developer in [place], [place]. Son Julie Thompson, age 69, works as a Programme researcher, broadcasting/film/video in [place], [place]. The patient has 4 grandchildren and 4 great-grandchildren. She attends [employer].    ADVANCED DIRECTIVES: In the absence of any documentation to the contrary, the patient's spouse is their HCPOA.    HEALTH MAINTENANCE: Social History   Tobacco Use  . Smoking status: Never Smoker  . Smokeless tobacco: Never Used  Substance Use Topics  . Alcohol use: No  . Drug use: No     Colonoscopy: 09/2013, with CT virtual 11/2013  PAP: none on file, s/p hysterectomy  Bone density: 04/2001, -0.5   Allergies  Allergen Reactions  . Sulfa Antibiotics Rash  . Penicillins Hives    Current Outpatient Medications  Medication Sig Dispense Refill  . aspirin EC 81 MG tablet Take 81 mg by mouth daily.    Marland Kitchen atorvastatin (LIPITOR) 20 MG tablet Take 20 mg by mouth daily.    . Cholecalciferol (VITAMIN D) 2000 UNITS tablet Take 2,000 Units by mouth  daily.    . co-enzyme Q-10 30 MG capsule Take 20 mg by mouth 3 (three) times daily.    Marland Kitchen esomeprazole (NEXIUM) 20 MG capsule Take 20 mg by mouth daily at 12 noon.    . famotidine (PEPCID) 20 MG tablet Take 40 mg by mouth 2 (two) times daily.    Marland Kitchen FIBER PO Take 1 tablet by mouth daily.    . fluconazole (DIFLUCAN) 200 MG tablet Take 1 tablet (200 mg total) by mouth daily. 30 tablet 0  . HYDROcodone-acetaminophen (NORCO/VICODIN) 5-325 MG tablet Take 1 tablet by mouth every 6 (six) hours as needed for moderate pain. 15 tablet 0  . levothyroxine (SYNTHROID) 50 MCG tablet Take 50 mcg by mouth daily.    Marland Kitchen lidocaine-prilocaine (EMLA) cream Apply 1 application topically as needed. 30 g 0  . loratadine (CLARITIN) 10 MG tablet Take 1 tablet (10 mg total) by mouth daily. 60 tablet 1  . LORazepam (ATIVAN) 0.5 MG tablet Take 1 tablet (0.5 mg total) by mouth at bedtime as needed (Nausea or vomiting). 30 tablet 0  . Multiple  Vitamin (MULTIVITAMIN WITH MINERALS) TABS tablet Take 1 tablet by mouth daily.    . nabumetone (RELAFEN) 500 MG tablet Take 500 mg by mouth 2 (two) times daily as needed.    . Omega-3 Fatty Acids (FISH OIL) 1200 MG CAPS Take 1,200 mg by mouth daily.    . ondansetron (ZOFRAN) 8 MG tablet Take 1 tablet (8 mg total) by mouth every 8 (eight) hours as needed for nausea or vomiting. May start no earlier than day 3 of a chemotherapy cycle. 20 tablet 4  . prochlorperazine (COMPAZINE) 10 MG tablet Take 1 tablet (10 mg total) by mouth every 6 (six) hours as needed (Nausea or vomiting). 30 tablet 1  . valACYclovir (VALTREX) 500 MG tablet Take 1 tablet (500 mg total) by mouth 2 (two) times daily. 180 tablet 0   No current facility-administered medications for this visit.    OBJECTIVE: White woman in no acute distress  Vitals:   10/15/19 0844  BP: 137/78  Pulse: (!) 105  Resp: 18  Temp: 98.3 F (36.8 C)  SpO2: 98%     Body mass index is 35.29 kg/m.   Wt Readings from Last 3 Encounters:  10/15/19 180 lb 11.2 oz (82 kg)  10/08/19 184 lb 8 oz (83.7 kg)  10/01/19 181 lb 11.2 oz (82.4 kg)  ECOG FS:1 - Symptomatic but completely ambulatory GENERAL: Patient is a well appearing female in no acute distress HEENT:  Sclerae anicteric.  Oropharynx clear and moist. No ulcerations or evidence of oropharyngeal candidiasis. Neck is supple.  NODES:  No cervical, supraclavicular, or axillary lymphadenopathy palpated.  BREAST EXAM: left breast mass is smaller and softer, no sign of clinical progression LUNGS:  Clear to auscultation bilaterally.  No wheezes or rhonchi. HEART:  Regular rate and rhythm. No murmur appreciated. ABDOMEN:  Soft, nontender.  Positive, normoactive bowel sounds. No organomegaly palpated. MSK:  No focal spinal tenderness to palpation. Full range of motion bilaterally in the upper extremities. EXTREMITIES:  No peripheral edema.   SKIN:  Clear with no obvious rashes or skin changes. No nail  dyscrasia. NEURO:  Nonfocal. Well oriented.  Appropriate affect.    LAB RESULTS:  CMP     Component Value Date/Time   NA 144 10/08/2019 0845   K 3.8 10/08/2019 0845   CL 111 10/08/2019 0845   CO2 23 10/08/2019 0845   GLUCOSE 111 (H) 10/08/2019  0845   BUN 13 10/08/2019 0845   CREATININE 0.73 10/08/2019 0845   CREATININE 0.94 07/17/2019 0823   CALCIUM 8.6 (L) 10/08/2019 0845   PROT 5.7 (L) 10/08/2019 0845   ALBUMIN 3.3 (L) 10/08/2019 0845   AST 15 10/08/2019 0845   AST 21 07/17/2019 0823   ALT 15 10/08/2019 0845   ALT 18 07/17/2019 0823   ALKPHOS 65 10/08/2019 0845   BILITOT 0.3 10/08/2019 0845   BILITOT 0.4 07/17/2019 0823   GFRNONAA >60 10/08/2019 0845   GFRNONAA >60 07/17/2019 0823   GFRAA >60 10/08/2019 0845   GFRAA >60 07/17/2019 0823    No results found for: TOTALPROTELP, ALBUMINELP, A1GS, A2GS, BETS, BETA2SER, GAMS, MSPIKE, SPEI  Lab Results  Component Value Date   WBC 3.7 (L) 10/15/2019   NEUTROABS 2.9 10/15/2019   HGB 9.1 (L) 10/15/2019   HCT 28.3 (L) 10/15/2019   MCV 103.3 (H) 10/15/2019   PLT 175 10/15/2019    No results found for: LABCA2  No components found for: [id number]  No results for input(s): INR in the last 168 hours.  No results found for: LABCA2  No results found for: ZLD357  No results found for: SVX793  No results found for: JQZ009  No results found for: CA2729  No components found for: HGQUANT  No results found for: CEA1 / No results found for: CEA1   No results found for: AFPTUMOR  No results found for: CHROMOGRNA  No results found for: KPAFRELGTCHN, LAMBDASER, KAPLAMBRATIO (kappa/lambda light chains)  No results found for: HGBA, HGBA2QUANT, HGBFQUANT, HGBSQUAN (Hemoglobinopathy evaluation)   No results found for: LDH  No results found for: IRON, TIBC, IRONPCTSAT (Iron and TIBC)  No results found for: FERRITIN  Urinalysis No results found for: COLORURINE, APPEARANCEUR, LABSPEC, PHURINE, GLUCOSEU, HGBUR,  BILIRUBINUR, KETONESUR, PROTEINUR, UROBILINOGEN, NITRITE, LEUKOCYTESUR   STUDIES: US BREAST LTD UNI LEFT INC AXILLA  Result Date: 09/25/2019 CLINICAL DATA:  74 year old female for evaluation of LEFT breast cancer response to neoadjuvant therapy. EXAM: ULTRASOUND OF THE LEFT BREAST COMPARISON:  07/01/2019 ultrasound FINDINGS: Targeted ultrasound is performed, showing decrease in size of the 2 adjacent masses/biopsy-proven malignancy at the 6 o'clock position of the LEFT breast 6 cm from the nipple. The anterior mass measures 1 x 0.9 x 1.2 cm, previously 1.5 x 1.7 x 1.7 cm. The posterior mass measures 1.7 x 0.6 x 1.6 cm, previously 2.1 x 1.4 x 1.6 cm Only 1 abnormal appearing lymph node is now identified, containing biopsy clip and with cortical thickening slightly decreased from the prior study. IMPRESSION: Decreased size of biopsy-proven malignancy within the LOWER LEFT breast. Slightly decreased cortical thickening of known lymph node metastasis. RECOMMENDATION: Treatment plan I have discussed the findings and recommendations with the patient. If applicable, a reminder letter will be sent to the patient regarding the next appointment. BI-RADS CATEGORY  6: Known biopsy-proven malignancy. Electronically Signed   By: Margarette Canada M.D.   On: 09/25/2019 09:21     ELIGIBLE FOR AVAILABLE RESEARCH PROTOCOL: no  ASSESSMENT: 74 y.o. [place] woman status post left breast lower outer quadrant biopsy 07/09/2019 for a clinical T2 N1, stage IIIB invasive ductal carcinoma, grade 3, triple negative, with an MIB-1 of 60%  (a) chest CT scan and bone scan 07/31/2019 showed no evidence of metastatic disease  (b) MRI biopsy of 2 additional areas in the left breast (central, 07/10/2019, and at 830 o'clock on 07/26/2019) are both benign and concordant  (c) biopsy of 2 suspicious areas in the  right breast on 07/26/2019 (9:00 and lower outer quadrant) were benign and concordant   (1) neoadjuvant chemotherapy consisting of  cyclophosphamide and doxorubicin in dose dense fashion x4 started 07/30/2019, completed 09/10/2019, followed by weekly carboplatin and paclitaxel x12 started 09/24/2019  (2) definitive surgery to follow with targeted axillary lymph node dissection  (3) adjuvant radiation to follow  (4) genetics testing 07/23/2019 through the STAT Breast cancer panel offered by [facility] found no deleterious mutations in ATM, BRCA1, BRCA2, CDH1, CHEK2, PALB2, PTEN, STK11 and TP53.  Additional testing through the Common Hereditary Cancers Panel offered by [facility] also found no deleterious mutations in IPC, ATM, AXIN2, BARD1, BMPR1A, BRCA1, BRCA2, BRIP1, CDH1, CDK4, CDKN2A (p14ARF), CDKN2A (p16INK4a), CHEK2, CTNNA1, DICER1, EPCAM (Deletion/duplication testing only), GREM1 (promoter region deletion/duplication testing only), KIT, MEN1, MLH1, MSH2, MSH3, MSH6, MUTYH, NBN, NF1, NHTL1, PALB2, PDGFRA, PMS2, POLD1, POLE, PTEN, RAD50, RAD51C, RAD51D, RNF43, SDHB, SDHC, SDHD, SMAD4, SMARCA4. STK11, TP53, TSC1, TSC2, and VHL.  The following genes were evaluated for sequence changes only: SDHA and HOXB13 c.251G>A variant only.   (a) 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    PLAN: Rainn continues on weekly Paclitaxel and Carboplatin with good tolerance.  She and I reviewed her labs in detail.  She will proceed with her next dose today.  She has no peripheral neuropathy at this point and we are monitoring her closely for this.  She is feeling improved on this chemotherapy regimen, so I recommended that she continue with healthy diet and exercise.  Her right breast tumor continues to respond.    Nevia will return in one week for labs and treatment and in 2 weeks for labs, f/u, and treatment.  She was recommended to continue with the appropriate pandemic precautions. She knows to call for any questions that may arise between now and her next appointment.  We are happy to see her sooner if  needed.   Total encounter time 20 minutes.Wilber Bihari, NP 10/15/19 9:07 AM Medical Oncology and Hematology [facility] [place], [place] [address] Tel. [phone]    Fax. [phone]   *Total Encounter Time as defined by the Centers for Medicare and Medicaid Services includes, in addition to the face-to-face time of a patient visit (documented in the note above) non-face-to-face time: obtaining and reviewing outside history, ordering and reviewing medications, tests or procedures, care coordination (communications with other health care professionals or caregivers) and documentation in the medical record.

## 2019-10-15 NOTE — Patient Instructions (Signed)
[facility] Discharge Instructions for Patients Receiving Chemotherapy  Today you received the following chemotherapy agents: Taxol/Carbo  To help prevent nausea and vomiting after your treatment, we encourage you to take your nausea medication as directed.   If you develop nausea and vomiting that is not controlled by your nausea medication, call the clinic.   BELOW ARE SYMPTOMS THAT SHOULD BE REPORTED IMMEDIATELY:  *FEVER GREATER THAN 100.5 F  *CHILLS WITH OR WITHOUT FEVER  NAUSEA AND VOMITING THAT IS NOT CONTROLLED WITH YOUR NAUSEA MEDICATION  *UNUSUAL SHORTNESS OF BREATH  *UNUSUAL BRUISING OR BLEEDING  TENDERNESS IN MOUTH AND THROAT WITH OR WITHOUT PRESENCE OF ULCERS  *URINARY PROBLEMS  *BOWEL PROBLEMS  UNUSUAL RASH Items with * indicate a potential emergency and should be followed up as soon as possible.  Feel free to call the clinic should you have any questions or concerns. The clinic phone number is [phone].  Please show the CHEMO ALERT CARD at check-in to the Emergency Department and triage nurse.   

## 2019-10-16 ENCOUNTER — Telehealth: Payer: Self-pay | Admitting: Adult Health

## 2019-10-16 NOTE — Telephone Encounter (Signed)
No 4/13 los. No changes made to pt's schedule.

## 2019-10-16 NOTE — Progress Notes (Signed)
Pharmacist Chemotherapy Monitoring - Follow Up Assessment    I verify that I have reviewed each item in the below checklist:  . Regimen for the patient is scheduled for the appropriate day and plan matches scheduled date. Marland Kitchen Appropriate non-routine labs are ordered dependent on drug ordered. . If applicable, additional medications reviewed and ordered per protocol based on lifetime cumulative doses and/or treatment regimen.   Plan for follow-up and/or issues identified: No . I-vent associated with next due treatment: No . MD and/or nursing notified: No  Skeeter Sheard K 10/16/2019 1:19 PM

## 2019-10-21 ENCOUNTER — Other Ambulatory Visit: Payer: Self-pay | Admitting: Oncology

## 2019-10-21 DIAGNOSIS — C50512 Malignant neoplasm of lower-outer quadrant of left female breast: Secondary | ICD-10-CM

## 2019-10-22 ENCOUNTER — Inpatient Hospital Stay: Payer: Medicare Other

## 2019-10-22 ENCOUNTER — Other Ambulatory Visit: Payer: Self-pay

## 2019-10-22 VITALS — BP 124/85 | HR 99 | Temp 98.2°F | Resp 20 | Wt 185.8 lb

## 2019-10-22 DIAGNOSIS — C773 Secondary and unspecified malignant neoplasm of axilla and upper limb lymph nodes: Secondary | ICD-10-CM | POA: Diagnosis not present

## 2019-10-22 DIAGNOSIS — Z791 Long term (current) use of non-steroidal anti-inflammatories (NSAID): Secondary | ICD-10-CM | POA: Diagnosis not present

## 2019-10-22 DIAGNOSIS — Z8051 Family history of malignant neoplasm of kidney: Secondary | ICD-10-CM | POA: Diagnosis not present

## 2019-10-22 DIAGNOSIS — C50512 Malignant neoplasm of lower-outer quadrant of left female breast: Secondary | ICD-10-CM

## 2019-10-22 DIAGNOSIS — J45909 Unspecified asthma, uncomplicated: Secondary | ICD-10-CM | POA: Diagnosis not present

## 2019-10-22 DIAGNOSIS — Z90710 Acquired absence of both cervix and uterus: Secondary | ICD-10-CM | POA: Diagnosis not present

## 2019-10-22 DIAGNOSIS — R42 Dizziness and giddiness: Secondary | ICD-10-CM | POA: Diagnosis not present

## 2019-10-22 DIAGNOSIS — K589 Irritable bowel syndrome without diarrhea: Secondary | ICD-10-CM | POA: Diagnosis not present

## 2019-10-22 DIAGNOSIS — Z5111 Encounter for antineoplastic chemotherapy: Secondary | ICD-10-CM | POA: Diagnosis not present

## 2019-10-22 DIAGNOSIS — Z79899 Other long term (current) drug therapy: Secondary | ICD-10-CM | POA: Diagnosis not present

## 2019-10-22 DIAGNOSIS — Z171 Estrogen receptor negative status [ER-]: Secondary | ICD-10-CM

## 2019-10-22 DIAGNOSIS — Z806 Family history of leukemia: Secondary | ICD-10-CM | POA: Diagnosis not present

## 2019-10-22 DIAGNOSIS — D649 Anemia, unspecified: Secondary | ICD-10-CM | POA: Diagnosis not present

## 2019-10-22 DIAGNOSIS — E039 Hypothyroidism, unspecified: Secondary | ICD-10-CM | POA: Diagnosis not present

## 2019-10-22 DIAGNOSIS — Z95828 Presence of other vascular implants and grafts: Secondary | ICD-10-CM

## 2019-10-22 DIAGNOSIS — Z803 Family history of malignant neoplasm of breast: Secondary | ICD-10-CM | POA: Diagnosis not present

## 2019-10-22 DIAGNOSIS — Z7982 Long term (current) use of aspirin: Secondary | ICD-10-CM | POA: Diagnosis not present

## 2019-10-22 LAB — COMPREHENSIVE METABOLIC PANEL
ALT: 20 U/L (ref 0–44)
AST: 16 U/L (ref 15–41)
Albumin: 3.3 g/dL — ABNORMAL LOW (ref 3.5–5.0)
Alkaline Phosphatase: 59 U/L (ref 38–126)
Anion gap: 11 (ref 5–15)
BUN: 16 mg/dL (ref 8–23)
CO2: 23 mmol/L (ref 22–32)
Calcium: 8.7 mg/dL — ABNORMAL LOW (ref 8.9–10.3)
Chloride: 107 mmol/L (ref 98–111)
Creatinine, Ser: 0.72 mg/dL (ref 0.44–1.00)
GFR calc Af Amer: >60 mL/min (ref >60)
GFR calc non Af Amer: >60 mL/min (ref >60)
Glucose, Bld: 123 mg/dL — ABNORMAL HIGH (ref 70–99)
Potassium: 3.5 mmol/L (ref 3.5–5.1)
Sodium: 141 mmol/L (ref 135–145)
Total Bilirubin: 0.3 mg/dL (ref 0.3–1.2)
Total Protein: 5.8 g/dL — ABNORMAL LOW (ref 6.5–8.1)

## 2019-10-22 LAB — CBC WITH DIFFERENTIAL/PLATELET
Abs Immature Granulocytes: 0.03 10*3/uL (ref 0.00–0.07)
Basophils Absolute: 0 10*3/uL (ref 0.0–0.1)
Basophils Relative: 0 %
Eosinophils Absolute: 0 10*3/uL (ref 0.0–0.5)
Eosinophils Relative: 0 %
HCT: 26.6 % — ABNORMAL LOW (ref 36.0–46.0)
Hemoglobin: 8.6 g/dL — ABNORMAL LOW (ref 12.0–15.0)
Immature Granulocytes: 1 %
Lymphocytes Relative: 11 %
Lymphs Abs: 0.4 10*3/uL — ABNORMAL LOW (ref 0.7–4.0)
MCH: 33.9 pg (ref 26.0–34.0)
MCHC: 32.3 g/dL (ref 30.0–36.0)
MCV: 104.7 fL — ABNORMAL HIGH (ref 80.0–100.0)
Monocytes Absolute: 0.3 10*3/uL (ref 0.1–1.0)
Monocytes Relative: 8 %
Neutro Abs: 2.7 10*3/uL (ref 1.7–7.7)
Neutrophils Relative %: 80 %
Platelets: 104 10*3/uL — ABNORMAL LOW (ref 150–400)
RBC: 2.54 MIL/uL — ABNORMAL LOW (ref 3.87–5.11)
RDW: 19.8 % — ABNORMAL HIGH (ref 11.5–15.5)
WBC: 3.4 10*3/uL — ABNORMAL LOW (ref 4.0–10.5)
nRBC: 1.5 % — ABNORMAL HIGH (ref 0.0–0.2)

## 2019-10-22 MED ORDER — SODIUM CHLORIDE 0.9% FLUSH
10.0000 mL | INTRAVENOUS | Status: DC | PRN
  Administered 2019-10-22: 13:00:00 10 mL
  Filled 2019-10-22: qty 10

## 2019-10-22 MED ORDER — PALONOSETRON HCL INJECTION 0.25 MG/5ML
0.2500 mg | Freq: Once | INTRAVENOUS | Status: AC
  Administered 2019-10-22: 10:00:00 0.25 mg via INTRAVENOUS

## 2019-10-22 MED ORDER — SODIUM CHLORIDE 0.9 % IV SOLN
Freq: Once | INTRAVENOUS | Status: AC
  Filled 2019-10-22: qty 250

## 2019-10-22 MED ORDER — DIPHENHYDRAMINE HCL 50 MG/ML IJ SOLN
INTRAMUSCULAR | Status: AC
  Filled 2019-10-22: qty 1

## 2019-10-22 MED ORDER — SODIUM CHLORIDE 0.9 % IV SOLN
182.8000 mg | Freq: Once | INTRAVENOUS | Status: AC
  Administered 2019-10-22: 180 mg via INTRAVENOUS
  Filled 2019-10-22: qty 18

## 2019-10-22 MED ORDER — FAMOTIDINE IN NACL 20-0.9 MG/50ML-% IV SOLN
20.0000 mg | Freq: Once | INTRAVENOUS | Status: AC
  Administered 2019-10-22: 11:00:00 20 mg via INTRAVENOUS

## 2019-10-22 MED ORDER — SODIUM CHLORIDE 0.9 % IV SOLN
150.0000 mg | Freq: Once | INTRAVENOUS | Status: AC
  Administered 2019-10-22: 10:00:00 150 mg via INTRAVENOUS
  Filled 2019-10-22: qty 150

## 2019-10-22 MED ORDER — SODIUM CHLORIDE 0.9 % IV SOLN
80.0000 mg/m2 | Freq: Once | INTRAVENOUS | Status: AC
  Administered 2019-10-22: 12:00:00 150 mg via INTRAVENOUS
  Filled 2019-10-22: qty 25

## 2019-10-22 MED ORDER — SODIUM CHLORIDE 0.9% FLUSH
10.0000 mL | Freq: Once | INTRAVENOUS | Status: AC
  Administered 2019-10-22: 10 mL
  Filled 2019-10-22: qty 10

## 2019-10-22 MED ORDER — FAMOTIDINE IN NACL 20-0.9 MG/50ML-% IV SOLN
INTRAVENOUS | Status: AC
  Filled 2019-10-22: qty 50

## 2019-10-22 MED ORDER — PALONOSETRON HCL INJECTION 0.25 MG/5ML
INTRAVENOUS | Status: AC
  Filled 2019-10-22: qty 5

## 2019-10-22 MED ORDER — DIPHENHYDRAMINE HCL 50 MG/ML IJ SOLN
50.0000 mg | Freq: Once | INTRAMUSCULAR | Status: AC
  Administered 2019-10-22: 10:00:00 50 mg via INTRAVENOUS

## 2019-10-22 MED ORDER — HEPARIN SOD (PORK) LOCK FLUSH 100 UNIT/ML IV SOLN
500.0000 [IU] | Freq: Once | INTRAVENOUS | Status: AC | PRN
  Administered 2019-10-22: 13:00:00 500 [IU]
  Filled 2019-10-22: qty 5

## 2019-10-22 MED ORDER — SODIUM CHLORIDE 0.9 % IV SOLN
10.0000 mg | Freq: Once | INTRAVENOUS | Status: AC
  Administered 2019-10-22: 10 mg via INTRAVENOUS
  Filled 2019-10-22: qty 10

## 2019-10-22 NOTE — Telephone Encounter (Signed)
Dex discontinue- please refill lorazepam if appropriate

## 2019-10-22 NOTE — Patient Instructions (Signed)
[facility] Discharge Instructions for Patients Receiving Chemotherapy  Today you received the following chemotherapy agents: Taxol/Carbo  To help prevent nausea and vomiting after your treatment, we encourage you to take your nausea medication as directed.   If you develop nausea and vomiting that is not controlled by your nausea medication, call the clinic.   BELOW ARE SYMPTOMS THAT SHOULD BE REPORTED IMMEDIATELY:  *FEVER GREATER THAN 100.5 F  *CHILLS WITH OR WITHOUT FEVER  NAUSEA AND VOMITING THAT IS NOT CONTROLLED WITH YOUR NAUSEA MEDICATION  *UNUSUAL SHORTNESS OF BREATH  *UNUSUAL BRUISING OR BLEEDING  TENDERNESS IN MOUTH AND THROAT WITH OR WITHOUT PRESENCE OF ULCERS  *URINARY PROBLEMS  *BOWEL PROBLEMS  UNUSUAL RASH Items with * indicate a potential emergency and should be followed up as soon as possible.  Feel free to call the clinic should you have any questions or concerns. The clinic phone number is [phone].  Please show the CHEMO ALERT CARD at check-in to the Emergency Department and triage nurse.   

## 2019-10-23 NOTE — Progress Notes (Signed)
Pharmacist Chemotherapy Monitoring - Follow Up Assessment    I verify that I have reviewed each item in the below checklist:  . Regimen for the patient is scheduled for the appropriate day and plan matches scheduled date. Marland Kitchen Appropriate non-routine labs are ordered dependent on drug ordered. . If applicable, additional medications reviewed and ordered per protocol based on lifetime cumulative doses and/or treatment regimen.   Plan for follow-up and/or issues identified: No . I-vent associated with next due treatment: No . MD and/or nursing notified: No  Staci Dack D 10/23/2019 4:04 PM

## 2019-10-25 ENCOUNTER — Other Ambulatory Visit: Payer: Self-pay | Admitting: *Deleted

## 2019-10-28 ENCOUNTER — Encounter: Payer: Self-pay | Admitting: *Deleted

## 2019-10-28 NOTE — Progress Notes (Signed)
[place]  Telephone:([phone]) [phone] [phone]) [phone]     ID: Julie Thompson DOB: 10/12/1945  MR#: [id number]  TTS#:[id number]  Patient Care Team: Leeroy Cha, MD as PCP - General (Internal Medicine) Rockwell Germany, RN as Oncology Nurse Navigator Mauro Kaufmann, RN as Oncology Nurse Navigator Eppie Gibson, MD as Attending Physician (Radiation Oncology) Donnie Mesa, MD as Consulting Physician (General Surgery) Magrinat, Virgie Dad, MD as Consulting Physician (Oncology) Scot Dock, NP OTHER MD:  CHIEF COMPLAINT: triple negative breast cancer  CURRENT TREATMENT: Neoadjuvant chemotherapy   INTERVAL HISTORY: Julie Thompson returns today for follow up of her triple negative breast cancer.   She completed the earlier part of her chemotherapy with cyclophosphamide and doxorubicin and started on weekly Paclitaxel and carboplatin .  Today is day 1 week 6 of 12 weekly doses of Paclitaxel and Carboplatin.  She is tolerating this well.  She denies any peripheral neuropathy.    REVIEW OF SYSTEMS: Julie Thompson notes some swelling on her face.  She is wondering what this is from.  She also has some nail discoloration underneath her nailbeds that started.  She has felt dizzy a few times this week, along with fatigue, and shortness of breath on exertion.  She is walking around and practicing energy conservation.    Julie Thompson denies any fever, chills, chest pain, palpitations, cough.  She has no bowel/bladder changes, nausea, or vomiting.  A detailed ROS was otherwise non contributory.    HISTORY OF CURRENT ILLNESS: From the original intake note:  Julie Thompson had routine screening mammography on 06/21/2019 showing a possible abnormality in the left breast. She underwent left diagnostic mammography with tomography and left breast ultrasonography at [facility] on 07/01/2019 showing: breast density category C; either 2 adjacent masses or 1 complex dumbbell-shaped mass  containing calcifications in the left breast at 6 o'clock, the largest of which measures 2.1 cm; chest wall invasion not excluded; 1-2 mm group of calcifications just anterior to the mass(es); single abnormal lymph node in the left axilla; vascular calcifications in anterior left breast.  Accordingly on 07/09/2019 she proceeded to biopsy of the left breast areas in question. The pathology from this procedure ([id number]) showed:  1. Left Breast, 5:30, posterior  - invasive ductal carcinoma with necrosis, grade 3  - Prognostic indicators significant for: estrogen receptor, 0% negative and progesterone receptor, 0% negative. Proliferation marker Ki67 at 60%. HER2 negative by immunohistochemistry (0). 2. Left Breast, 5:30, posterior  - invasive ductal carcinoma with necrosis, grade 3  - ductal carcinoma in situ, intermediate grade 3. Left Axilla, lymph node (1)  - metastatic carcinoma involving a lymph node  And additional biopsy of the anterior vascular calcifications in the left breast was performed on 07/10/2019. Pathology [id number]) showed: fibrocystic changes with calcifications; no malignancy identified.  This was felt to be concordant.  The patient's subsequent history is as detailed below.   PAST MEDICAL HISTORY: Past Medical History:  Diagnosis Date  . Asthma    no problems now  . Family history of basal cell carcinoma   . Family history of breast cancer   . Family history of colon cancer   . Family history of kidney cancer   . Family history of leukemia   . Family history of peritoneal cancer   . GERD (gastroesophageal reflux disease)   . H/O hiatal hernia   . Hyperlipidemia   . Hypothyroidism   . IBS (irritable bowel syndrome)   . lt breast ca dx'd 07/10/19  PAST SURGICAL HISTORY: Past Surgical History:  Procedure Laterality Date  . ABDOMINAL HYSTERECTOMY     age 35  . BREAST SURGERY    . DILATION AND CURETTAGE OF UTERUS     2 miscarriages age 20 & 21  . FLEXIBLE  SIGMOIDOSCOPY N/A 10/22/2013   Procedure: FLEXIBLE SIGMOIDOSCOPY;  Surgeon: Martin K Johnson, MD;  Location: [facility];  Service: Endoscopy;  Laterality: N/A;  . KNEE ARTHROSCOPY Left   . PORTACATH PLACEMENT Right 07/29/2019   Procedure: INSERTION PORT-A-CATH WITH ULTRASOUND;  Surgeon: Tsuei, Matthew, MD;  Location: [facility];  Service: General;  Laterality: Right;  . TONSILLECTOMY     age 7    FAMILY HISTORY: Family History  Problem Relation Age of Onset  . Breast cancer Maternal Aunt        dx. in her 80s  . Cancer Mother 77       peritoneal cancer, death certificate lists ovarian cancer  . Colon cancer Paternal Uncle        dx. late 50s or early 60s  . Basal cell carcinoma Sister 19       a second diagnosed in her 40s  . Kidney cancer Maternal Grandmother 71  . Diabetes Paternal Grandmother   . Heart Problems Paternal Grandmother   . Leukemia Paternal Uncle        dx. early 60s   Patient's father is currently living at age 95 as of 07/2019. Patient's mother died from ovarian cancer at age 78. She was diagnosed at age 76. The patient reports breast cancer in a maternal aunt, diagnosed in her 70's. The patient also noted colon cancer in a paternal uncle and kidney cancer in her maternal grandmother.  She has 1 sister.   GYNECOLOGIC HISTORY:  No LMP recorded. Patient has had a hysterectomy. Menarche: [age] years old Age at first live birth: 74 years old GX P 2 LMP age 35 (with hysterectomy) Contraceptive: never used HRT used for 10-12 years  Hysterectomy? Yes, age 35 BSO? 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  Husband William "Butch" Griffin Sr. is a retired [employer] company supervisor. She lives at home with husband Butch. She has two children from a prior marriage. Son Jerry King, age 50, works as a carpenter in [place], [place]. Son James King, age 49, works as a server in [place], [place]. The patient has  4 grandchildren and 4 great-grandchildren. She attends [employer].    ADVANCED DIRECTIVES: In the absence of any documentation to the contrary, the patient's spouse is their HCPOA.    HEALTH MAINTENANCE: Social History   Tobacco Use  . Smoking status: Never Smoker  . Smokeless tobacco: Never Used  Substance Use Topics  . Alcohol use: No  . Drug use: No     Colonoscopy: 09/2013, with CT virtual 11/2013  PAP: none on file, s/p hysterectomy  Bone density: 04/2001, -0.5   Allergies  Allergen Reactions  . Sulfa Antibiotics Rash  . Penicillins Hives    Current Outpatient Medications  Medication Sig Dispense Refill  . aspirin EC 81 MG tablet Take 81 mg by mouth daily.    . atorvastatin (LIPITOR) 20 MG tablet Take 20 mg by mouth daily.    . Cholecalciferol (VITAMIN D) 2000 UNITS tablet Take 2,000 Units by mouth daily.    . co-enzyme Q-10 30 MG capsule Take 20 mg by mouth 3 (three) times daily.    . esomeprazole (  NEXIUM) 20 MG capsule Take 20 mg by mouth daily at 12 noon.    . famotidine (PEPCID) 20 MG tablet Take 40 mg by mouth 2 (two) times daily.    Marland Kitchen FIBER PO Take 1 tablet by mouth daily.    . fluconazole (DIFLUCAN) 200 MG tablet Take 1 tablet (200 mg total) by mouth daily. 30 tablet 0  . levothyroxine (SYNTHROID) 50 MCG tablet Take 50 mcg by mouth daily.    Marland Kitchen lidocaine-prilocaine (EMLA) cream Apply 1 application topically as needed. 30 g 0  . loratadine (CLARITIN) 10 MG tablet Take 1 tablet (10 mg total) by mouth daily. 60 tablet 1  . LORazepam (ATIVAN) 0.5 MG tablet TAKE 1 TABLET(0.5 MG) BY MOUTH AT BEDTIME AS NEEDED FOR NAUSEA OR VOMITING 30 tablet 0  . Multiple Vitamin (MULTIVITAMIN WITH MINERALS) TABS tablet Take 1 tablet by mouth daily.    . nabumetone (RELAFEN) 500 MG tablet Take 500 mg by mouth 2 (two) times daily as needed.    . Omega-3 Fatty Acids (FISH OIL) 1200 MG CAPS Take 1,200 mg by mouth daily.    . prochlorperazine (COMPAZINE) 10 MG tablet Take 1  tablet (10 mg total) by mouth every 6 (six) hours as needed (Nausea or vomiting). 30 tablet 1  . valACYclovir (VALTREX) 500 MG tablet Take 1 tablet (500 mg total) by mouth 2 (two) times daily. 180 tablet 0  . HYDROcodone-acetaminophen (NORCO/VICODIN) 5-325 MG tablet Take 1 tablet by mouth every 6 (six) hours as needed for moderate pain. (Patient not taking: Reported on 10/29/2019) 15 tablet 0  . ondansetron (ZOFRAN) 8 MG tablet Take 1 tablet (8 mg total) by mouth every 8 (eight) hours as needed for nausea or vomiting. May start no earlier than day 3 of a chemotherapy cycle. (Patient not taking: Reported on 10/29/2019) 20 tablet 4   No current facility-administered medications for this visit.    OBJECTIVE:   Vitals:   10/29/19 0915  BP: 137/70  Pulse: 94  Resp: 18  Temp: 98.3 F (36.8 C)  SpO2: 100%     Body mass index is 36.21 kg/m.   Wt Readings from Last 3 Encounters:  10/29/19 185 lb 6.4 oz (84.1 kg)  10/22/19 185 lb 12 oz (84.3 kg)  10/15/19 180 lb 11.2 oz (82 kg)  ECOG FS:1 - Symptomatic but completely ambulatory GENERAL: Patient is a well appearing female in no acute distress HEENT:  Sclerae anicteric.  Mask in place. Neck is supple.  NODES:  No cervical, supraclavicular, or axillary lymphadenopathy palpated.  BREAST EXAM: left breast mass is smaller and softer, no sign of clinical progression LUNGS:  Clear to auscultation bilaterally.  No wheezes or rhonchi. HEART:  Regular rate and rhythm. No murmur appreciated. ABDOMEN:  Soft, nontender.  Positive, normoactive bowel sounds. No organomegaly palpated. MSK:  No focal spinal tenderness to palpation. Full range of motion bilaterally in the upper extremities. EXTREMITIES:  No peripheral edema.   SKIN:  Clear with no obvious rashes or skin changes. Nail discoloration noted underneath bilateral first digit nail beds NEURO:  Nonfocal. Well oriented.  Appropriate affect.    LAB RESULTS:  CMP     Component Value Date/Time   NA  141 10/22/2019 0844   K 3.5 10/22/2019 0844   CL 107 10/22/2019 0844   CO2 23 10/22/2019 0844   GLUCOSE 123 (H) 10/22/2019 0844   BUN 16 10/22/2019 0844   CREATININE 0.72 10/22/2019 0844   CREATININE 0.94 07/17/2019 6503  CALCIUM 8.7 (L) 10/22/2019 0844   PROT 5.8 (L) 10/22/2019 0844   ALBUMIN 3.3 (L) 10/22/2019 0844   AST 16 10/22/2019 0844   AST 21 07/17/2019 0823   ALT 20 10/22/2019 0844   ALT 18 07/17/2019 0823   ALKPHOS 59 10/22/2019 0844   BILITOT 0.3 10/22/2019 0844   BILITOT 0.4 07/17/2019 0823   GFRNONAA >60 10/22/2019 0844   GFRNONAA >60 07/17/2019 0823   GFRAA >60 10/22/2019 0844   GFRAA >60 07/17/2019 0823    No results found for: TOTALPROTELP, ALBUMINELP, A1GS, A2GS, BETS, BETA2SER, GAMS, MSPIKE, SPEI  Lab Results  Component Value Date   WBC 1.7 (L) 10/29/2019   NEUTROABS 1.3 (L) 10/29/2019   HGB 8.0 (L) 10/29/2019   HCT 24.8 (L) 10/29/2019   MCV 106.4 (H) 10/29/2019   PLT 85 (L) 10/29/2019    No results found for: LABCA2  No components found for: LABCAN125  No results for input(s): INR in the last 168 hours.  No results found for: LABCA2  No results found for: CAN199  No results found for: CAN125  No results found for: CAN153  No results found for: CA2729  No components found for: HGQUANT  No results found for: CEA1 / No results found for: CEA1   No results found for: AFPTUMOR  No results found for: CHROMOGRNA  No results found for: KPAFRELGTCHN, LAMBDASER, KAPLAMBRATIO (kappa/lambda light chains)  No results found for: HGBA, HGBA2QUANT, HGBFQUANT, HGBSQUAN (Hemoglobinopathy evaluation)   No results found for: LDH  No results found for: IRON, TIBC, IRONPCTSAT (Iron and TIBC)  No results found for: FERRITIN  Urinalysis No results found for: COLORURINE, APPEARANCEUR, LABSPEC, PHURINE, GLUCOSEU, HGBUR, BILIRUBINUR, KETONESUR, PROTEINUR, UROBILINOGEN, NITRITE, LEUKOCYTESUR   STUDIES: No results found.   ELIGIBLE FOR AVAILABLE  RESEARCH PROTOCOL: no  ASSESSMENT: 73 y.o. [place] woman status post left breast lower outer quadrant biopsy 07/09/2019 for a clinical T2 N1, stage IIIB invasive ductal carcinoma, grade 3, triple negative, with an MIB-1 of 60%  (a) chest CT scan and bone scan 07/31/2019 showed no evidence of metastatic disease  (b) MRI biopsy of 2 additional areas in the left breast (central, 07/10/2019, and at 830 o'clock on 07/26/2019) are both benign and concordant  (c) biopsy of 2 suspicious areas in the right breast on 07/26/2019 (9:00 and lower outer quadrant) were benign and concordant   (1) neoadjuvant chemotherapy consisting of cyclophosphamide and doxorubicin in dose dense fashion x4 started 07/30/2019, completed 09/10/2019, followed by weekly carboplatin and paclitaxel x12 started 09/24/2019  (2) definitive surgery to follow with targeted axillary lymph node dissection  (3) adjuvant radiation to follow  (4) genetics testing 07/23/2019 through the STAT Breast cancer panel offered by [facility] found no deleterious mutations in ATM, BRCA1, BRCA2, CDH1, CHEK2, PALB2, PTEN, STK11 and TP53.  Additional testing through the Common Hereditary Cancers Panel offered by [facility] also found no deleterious mutations in IPC, ATM, AXIN2, BARD1, BMPR1A, BRCA1, BRCA2, BRIP1, CDH1, CDK4, CDKN2A (p14ARF), CDKN2A (p16INK4a), CHEK2, CTNNA1, DICER1, EPCAM (Deletion/duplication testing only), GREM1 (promoter region deletion/duplication testing only), KIT, MEN1, MLH1, MSH2, MSH3, MSH6, MUTYH, NBN, NF1, NHTL1, PALB2, PDGFRA, PMS2, POLD1, POLE, PTEN, RAD50, RAD51C, RAD51D, RNF43, SDHB, SDHC, SDHD, SMAD4, SMARCA4. STK11, TP53, TSC1, TSC2, and VHL.  The following genes were evaluated for sequence changes only: SDHA and HOXB13 c.251G>A variant only.   (a) 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    PLAN: Dillyn continues on weekly Paclitaxel and Carboplatin with   good tolerance.  Her breast  tumor is even smaller than it was last week, which is great.  She has no peripheral neuropathy which we are monitoring her for closely.    Unfortunately, Julie Thompson has a plt count of 85 today and cannot receive treatment.  Her hemoglobin is 8 and she is symptomatic with intermittent dizziness. I think that she would benefit from a blood transfusion and she will receive one unit of blood today instead of chemotherapy.    I refilled her compazine, and reassured her that the nail discoloration is related to the Paclitaxel, and the full face related to her dexamethasone.    Julie Thompson will return in one week for labs, f/u and to resume treatment.  She was recommended to continue with the appropriate pandemic precautions. She knows to call for any questions that may arise between now and her next appointment.  We are happy to see her sooner if needed.   Total encounter time 20 minutes.*  Lindsey Causey, NP 10/29/19 9:35 AM Medical Oncology and Hematology [facility] [address] , [place] [address] Tel. [phone]    Fax. [phone]   *Total Encounter Time as defined by the Centers for Medicare and Medicaid Services includes, in addition to the face-to-face time of a patient visit (documented in the note above) non-face-to-face time: obtaining and reviewing outside history, ordering and reviewing medications, tests or procedures, care coordination (communications with other health care professionals or caregivers) and documentation in the medical record. 

## 2019-10-29 ENCOUNTER — Inpatient Hospital Stay: Payer: Medicare Other | Admitting: Adult Health

## 2019-10-29 ENCOUNTER — Encounter: Payer: Self-pay | Admitting: Adult Health

## 2019-10-29 ENCOUNTER — Inpatient Hospital Stay: Payer: Medicare Other

## 2019-10-29 ENCOUNTER — Other Ambulatory Visit: Payer: Self-pay

## 2019-10-29 VITALS — BP 137/70 | HR 94 | Temp 98.3°F | Resp 18 | Ht 60.0 in | Wt 185.4 lb

## 2019-10-29 DIAGNOSIS — D649 Anemia, unspecified: Secondary | ICD-10-CM

## 2019-10-29 DIAGNOSIS — C50512 Malignant neoplasm of lower-outer quadrant of left female breast: Secondary | ICD-10-CM

## 2019-10-29 DIAGNOSIS — R42 Dizziness and giddiness: Secondary | ICD-10-CM | POA: Diagnosis not present

## 2019-10-29 DIAGNOSIS — K589 Irritable bowel syndrome without diarrhea: Secondary | ICD-10-CM | POA: Diagnosis not present

## 2019-10-29 DIAGNOSIS — Z5111 Encounter for antineoplastic chemotherapy: Secondary | ICD-10-CM | POA: Diagnosis not present

## 2019-10-29 DIAGNOSIS — Z806 Family history of leukemia: Secondary | ICD-10-CM | POA: Diagnosis not present

## 2019-10-29 DIAGNOSIS — Z7982 Long term (current) use of aspirin: Secondary | ICD-10-CM | POA: Diagnosis not present

## 2019-10-29 DIAGNOSIS — Z79899 Other long term (current) drug therapy: Secondary | ICD-10-CM | POA: Diagnosis not present

## 2019-10-29 DIAGNOSIS — E039 Hypothyroidism, unspecified: Secondary | ICD-10-CM | POA: Diagnosis not present

## 2019-10-29 DIAGNOSIS — Z171 Estrogen receptor negative status [ER-]: Secondary | ICD-10-CM

## 2019-10-29 DIAGNOSIS — Z803 Family history of malignant neoplasm of breast: Secondary | ICD-10-CM | POA: Diagnosis not present

## 2019-10-29 DIAGNOSIS — C773 Secondary and unspecified malignant neoplasm of axilla and upper limb lymph nodes: Secondary | ICD-10-CM | POA: Diagnosis not present

## 2019-10-29 DIAGNOSIS — Z791 Long term (current) use of non-steroidal anti-inflammatories (NSAID): Secondary | ICD-10-CM | POA: Diagnosis not present

## 2019-10-29 DIAGNOSIS — Z95828 Presence of other vascular implants and grafts: Secondary | ICD-10-CM

## 2019-10-29 DIAGNOSIS — Z8051 Family history of malignant neoplasm of kidney: Secondary | ICD-10-CM | POA: Diagnosis not present

## 2019-10-29 DIAGNOSIS — J45909 Unspecified asthma, uncomplicated: Secondary | ICD-10-CM | POA: Diagnosis not present

## 2019-10-29 DIAGNOSIS — Z90710 Acquired absence of both cervix and uterus: Secondary | ICD-10-CM | POA: Diagnosis not present

## 2019-10-29 LAB — COMPREHENSIVE METABOLIC PANEL
ALT: 20 U/L (ref 0–44)
AST: 17 U/L (ref 15–41)
Albumin: 3.4 g/dL — ABNORMAL LOW (ref 3.5–5.0)
Alkaline Phosphatase: 64 U/L (ref 38–126)
Anion gap: 8 (ref 5–15)
BUN: 14 mg/dL (ref 8–23)
CO2: 26 mmol/L (ref 22–32)
Calcium: 8.8 mg/dL — ABNORMAL LOW (ref 8.9–10.3)
Chloride: 108 mmol/L (ref 98–111)
Creatinine, Ser: 0.72 mg/dL (ref 0.44–1.00)
GFR calc Af Amer: >60 mL/min (ref >60)
GFR calc non Af Amer: >60 mL/min (ref >60)
Glucose, Bld: 121 mg/dL — ABNORMAL HIGH (ref 70–99)
Potassium: 4.1 mmol/L (ref 3.5–5.1)
Sodium: 142 mmol/L (ref 135–145)
Total Bilirubin: 0.4 mg/dL (ref 0.3–1.2)
Total Protein: 5.9 g/dL — ABNORMAL LOW (ref 6.5–8.1)

## 2019-10-29 LAB — CBC WITH DIFFERENTIAL/PLATELET
Abs Immature Granulocytes: 0.01 10*3/uL (ref 0.00–0.07)
Basophils Absolute: 0 10*3/uL (ref 0.0–0.1)
Basophils Relative: 0 %
Eosinophils Absolute: 0 10*3/uL (ref 0.0–0.5)
Eosinophils Relative: 1 %
HCT: 24.8 % — ABNORMAL LOW (ref 36.0–46.0)
Hemoglobin: 8 g/dL — ABNORMAL LOW (ref 12.0–15.0)
Immature Granulocytes: 1 %
Lymphocytes Relative: 16 %
Lymphs Abs: 0.3 10*3/uL — ABNORMAL LOW (ref 0.7–4.0)
MCH: 34.3 pg — ABNORMAL HIGH (ref 26.0–34.0)
MCHC: 32.3 g/dL (ref 30.0–36.0)
MCV: 106.4 fL — ABNORMAL HIGH (ref 80.0–100.0)
Monocytes Absolute: 0.1 10*3/uL (ref 0.1–1.0)
Monocytes Relative: 7 %
Neutro Abs: 1.3 10*3/uL — ABNORMAL LOW (ref 1.7–7.7)
Neutrophils Relative %: 75 %
Platelets: 85 10*3/uL — ABNORMAL LOW (ref 150–400)
RBC: 2.33 MIL/uL — ABNORMAL LOW (ref 3.87–5.11)
RDW: 19 % — ABNORMAL HIGH (ref 11.5–15.5)
WBC: 1.7 10*3/uL — ABNORMAL LOW (ref 4.0–10.5)
nRBC: 4.2 % — ABNORMAL HIGH (ref 0.0–0.2)

## 2019-10-29 LAB — PREPARE RBC (CROSSMATCH)

## 2019-10-29 LAB — ABO/RH: ABO/RH(D): O NEG

## 2019-10-29 MED ORDER — SODIUM CHLORIDE 0.9% FLUSH
10.0000 mL | Freq: Once | INTRAVENOUS | Status: AC
  Administered 2019-10-29: 10 mL
  Filled 2019-10-29: qty 10

## 2019-10-29 MED ORDER — ACETAMINOPHEN 325 MG PO TABS
650.0000 mg | ORAL_TABLET | Freq: Once | ORAL | Status: AC
  Administered 2019-10-29: 650 mg via ORAL

## 2019-10-29 MED ORDER — DIPHENHYDRAMINE HCL 25 MG PO CAPS
ORAL_CAPSULE | ORAL | Status: AC
  Filled 2019-10-29: qty 1

## 2019-10-29 MED ORDER — ACETAMINOPHEN 325 MG PO TABS
ORAL_TABLET | ORAL | Status: AC
  Filled 2019-10-29: qty 2

## 2019-10-29 MED ORDER — SODIUM CHLORIDE 0.9% FLUSH
10.0000 mL | INTRAVENOUS | Status: AC | PRN
  Administered 2019-10-29: 10 mL
  Filled 2019-10-29: qty 10

## 2019-10-29 MED ORDER — HEPARIN SOD (PORK) LOCK FLUSH 100 UNIT/ML IV SOLN
500.0000 [IU] | Freq: Every day | INTRAVENOUS | Status: AC | PRN
  Administered 2019-10-29: 500 [IU]
  Filled 2019-10-29: qty 5

## 2019-10-29 MED ORDER — SODIUM CHLORIDE 0.9% IV SOLUTION
250.0000 mL | Freq: Once | INTRAVENOUS | Status: AC
  Administered 2019-10-29: 250 mL via INTRAVENOUS
  Filled 2019-10-29: qty 250

## 2019-10-29 MED ORDER — DIPHENHYDRAMINE HCL 25 MG PO CAPS
25.0000 mg | ORAL_CAPSULE | Freq: Once | ORAL | Status: AC
  Administered 2019-10-29: 25 mg via ORAL

## 2019-10-29 MED ORDER — PROCHLORPERAZINE MALEATE 10 MG PO TABS: 10.0000 mg | ORAL_TABLET | Freq: Four times a day (QID) | ORAL | 1 refills | Status: DC | PRN

## 2019-10-29 NOTE — Patient Instructions (Signed)

## 2019-10-30 ENCOUNTER — Telehealth: Payer: Self-pay | Admitting: Adult Health

## 2019-10-30 LAB — TYPE AND SCREEN
ABO/RH(D): O NEG
Antibody Screen: NEGATIVE
Unit division: 0

## 2019-10-30 LAB — BPAM RBC
Blood Product Expiration Date: 202105292359
ISSUE DATE / TIME: 202104271136
Unit Type and Rh: 9500

## 2019-10-30 NOTE — Telephone Encounter (Signed)
No 4/27 los. No changes made to pt's schedule.  

## 2019-10-30 NOTE — Progress Notes (Signed)
Pharmacist Chemotherapy Monitoring - Follow Up Assessment    I verify that I have reviewed each item in the below checklist:  . Regimen for the patient is scheduled for the appropriate day and plan matches scheduled date. Marland Kitchen Appropriate non-routine labs are ordered dependent on drug ordered. . If applicable, additional medications reviewed and ordered per protocol based on lifetime cumulative doses and/or treatment regimen.   Plan for follow-up and/or issues identified: No . I-vent associated with next due treatment: No . MD and/or nursing notified: No  Philomena Course 10/30/2019 1:22 PM

## 2019-11-04 MED FILL — Fosaprepitant Dimeglumine For IV Infusion 150 MG (Base Eq): INTRAVENOUS | Qty: 5 | Status: AC

## 2019-11-04 MED FILL — Dexamethasone Sodium Phosphate Inj 100 MG/10ML: INTRAMUSCULAR | Qty: 1 | Status: AC

## 2019-11-04 NOTE — Progress Notes (Signed)
[place]  [phone] [phone]     ID: Julie Thompson DOB: [birth year]  MR#: [id number]  MEQ#:[id number]  Patient Care Team: Leeroy Cha, MD as PCP - General (Internal Medicine) Rockwell Germany, RN as Oncology Nurse Navigator Mauro Kaufmann, RN as Oncology Nurse Navigator Eppie Gibson, MD as Attending Physician (Radiation Oncology) Donnie Mesa, MD as Consulting Physician (General Surgery) Tahara Ruffini, Virgie Dad, MD as Consulting Physician (Oncology) Chauncey Cruel, MD OTHER MD:  CHIEF COMPLAINT: triple negative breast cancer  CURRENT TREATMENT: Neoadjuvant chemotherapy   INTERVAL HISTORY: Julie Thompson returns today for follow up and treatment of her triple negative breast cancer.   She completed the earlier part of her chemotherapy with cyclophosphamide and doxorubicin and started on weekly Paclitaxel and carboplatin .  Today she will receive his sixth of 12 weekly doses of Paclitaxel and Carboplatin.  She is tolerating this well.  She denies any peripheral neuropathy.   Her platelet count was down to 85 last week which is the reason her sixth treatment was held.  In addition she had a hemoglobin of 8.  She proceeded to blood transfusion 10/29/2019.   REVIEW OF SYSTEMS: Julie Thompson is not walking as much as she would like.  She has had no peripheral neuropathy issues.  She has not yet begun to receive any of the vaccines of course, she is waiting until the completion of chemotherapy for that.  She has had no intercurrent fever.  She is not aware of any bleeding or rash.  A detailed review of systems today was otherwise stable    HISTORY OF CURRENT ILLNESS: From the original intake note:  Julie Thompson had routine screening mammography on 06/21/2019 showing a possible abnormality in the left breast. She underwent left diagnostic mammography with tomography and left breast ultrasonography at [facility] on 07/01/2019 showing:  breast density category C; either 2 adjacent masses or 1 complex dumbbell-shaped mass containing calcifications in the left breast at 6 o'clock, the largest of which measures 2.1 cm; chest wall invasion not excluded; 1-2 mm group of calcifications just anterior to the mass(es); single abnormal lymph node in the left axilla; vascular calcifications in anterior left breast.  Accordingly on 07/09/2019 she proceeded to biopsy of the left breast areas in question. The pathology from this procedure ([id number]) showed:  1. Left Breast, 5:30, posterior  - invasive ductal carcinoma with necrosis, grade 3  - Prognostic indicators significant for: estrogen receptor, 0% negative and progesterone receptor, 0% negative. Proliferation marker Ki67 at 60%. HER2 negative by immunohistochemistry (0). 2. Left Breast, 5:30, posterior  - invasive ductal carcinoma with necrosis, grade 3  - ductal carcinoma in situ, intermediate grade 3. Left Axilla, lymph node (1)  - metastatic carcinoma involving a lymph node  And additional biopsy of the anterior vascular calcifications in the left breast was performed on 07/10/2019. Pathology [id number]) showed: fibrocystic changes with calcifications; no malignancy identified.  This was felt to be concordant.  The patient's subsequent history is as detailed below.   PAST MEDICAL HISTORY: Past Medical History:  Diagnosis Date   Asthma    no problems now   Family history of basal cell carcinoma    Family history of breast cancer    Family history of colon cancer    Family history of kidney cancer    Family history of leukemia    Family history of peritoneal cancer    GERD (gastroesophageal reflux disease)    H/O hiatal hernia  Hyperlipidemia    Hypothyroidism    IBS (irritable bowel syndrome)    lt breast ca dx'd 07/10/19    PAST SURGICAL HISTORY: Past Surgical History:  Procedure Laterality Date   ABDOMINAL HYSTERECTOMY     age 72   BREAST SURGERY       DILATION AND CURETTAGE OF UTERUS     2 miscarriages age 89 & 35   FLEXIBLE SIGMOIDOSCOPY N/A 10/22/2013   Procedure: FLEXIBLE SIGMOIDOSCOPY;  Surgeon: Garlan Fair, MD;  Location: [facility];  Service: Endoscopy;  Laterality: N/A;   KNEE ARTHROSCOPY Left    PORTACATH PLACEMENT Right 2019/08/04   Procedure: INSERTION PORT-A-CATH WITH ULTRASOUND;  Surgeon: Donnie Mesa, MD;  Location: [facility];  Service: General;  Laterality: Right;   TONSILLECTOMY     age 82    FAMILY HISTORY: Family History  Problem Relation Age of Onset   Breast cancer Maternal Aunt        dx. in her 4s   Cancer Mother 35       peritoneal cancer, death certificate lists ovarian cancer   Colon cancer Paternal Uncle        dx. late 76s or early 72s   Basal cell carcinoma Sister 72       a second diagnosed in her 32s   Kidney cancer Maternal Grandmother 71   Diabetes Paternal Grandmother    Heart Problems Paternal Grandmother    Leukemia Paternal Uncle        dx. early 38s   Patient's father is currently living at age 26 as of 08/04/19. Patient's mother died from ovarian cancer at age 25. She was diagnosed at age 62. The patient reports breast cancer in a maternal aunt, diagnosed in her 55's. The patient also noted colon cancer in a paternal uncle and kidney cancer in her maternal grandmother.  She has 1 sister.   GYNECOLOGIC HISTORY:  No LMP recorded. Patient has had a hysterectomy. Menarche: [age] years old Age at first live birth: 74 years old [place] P 2 LMP age 26 (with hysterectomy) Contraceptive: never used HRT used for 10-12 years  Hysterectomy? Yes, age 62 BSO? 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  Husband [name] is a retired Psychologist, prison and probation services. She lives at home with husband Butch. She has two children from a prior marriage. Son [name], age 12, works as a Games developer in  [place], [place]. Son [name], age 51, works as a Programme researcher, broadcasting/film/video in [place], [place]. The patient has 4 grandchildren and 4 great-grandchildren. She attends [employer].    ADVANCED DIRECTIVES: In the absence of any documentation to the contrary, the patient's spouse is their HCPOA.    HEALTH MAINTENANCE: Social History   Tobacco Use   Smoking status: Never Smoker   Smokeless tobacco: Never Used  Substance Use Topics   Alcohol use: No   Drug use: No     Colonoscopy: 09/2013, with CT virtual 11/2013  PAP: none on file, s/p hysterectomy  Bone density: 04/2001, -0.5   Allergies  Allergen Reactions   Sulfa Antibiotics Rash   Penicillins Hives    Current Outpatient Medications  Medication Sig Dispense Refill   aspirin EC 81 MG tablet Take 81 mg by mouth daily.     atorvastatin (LIPITOR) 20 MG tablet Take 20 mg by mouth daily.     Cholecalciferol (VITAMIN D) 2000 UNITS tablet Take 2,000 Units by mouth  daily.     co-enzyme Q-10 30 MG capsule Take 20 mg by mouth 3 (three) times daily.     esomeprazole (NEXIUM) 20 MG capsule Take 20 mg by mouth daily at 12 noon.     famotidine (PEPCID) 20 MG tablet Take 40 mg by mouth 2 (two) times daily.     FIBER PO Take 1 tablet by mouth daily.     fluconazole (DIFLUCAN) 200 MG tablet Take 1 tablet (200 mg total) by mouth daily. 30 tablet 0   HYDROcodone-acetaminophen (NORCO/VICODIN) 5-325 MG tablet Take 1 tablet by mouth every 6 (six) hours as needed for moderate pain. (Patient not taking: Reported on 10/29/2019) 15 tablet 0   levothyroxine (SYNTHROID) 50 MCG tablet Take 50 mcg by mouth daily.     lidocaine-prilocaine (EMLA) cream Apply 1 application topically as needed. 30 g 0   loratadine (CLARITIN) 10 MG tablet Take 1 tablet (10 mg total) by mouth daily. 60 tablet 1   LORazepam (ATIVAN) 0.5 MG tablet TAKE 1 TABLET(0.5 MG) BY MOUTH AT BEDTIME AS NEEDED FOR NAUSEA OR VOMITING 30 tablet 0   Multiple Vitamin (MULTIVITAMIN  WITH MINERALS) TABS tablet Take 1 tablet by mouth daily.     nabumetone (RELAFEN) 500 MG tablet Take 500 mg by mouth 2 (two) times daily as needed.     Omega-3 Fatty Acids (FISH OIL) 1200 MG CAPS Take 1,200 mg by mouth daily.     prochlorperazine (COMPAZINE) 10 MG tablet Take 1 tablet (10 mg total) by mouth every 6 (six) hours as needed (Nausea or vomiting). 30 tablet 1   valACYclovir (VALTREX) 500 MG tablet Take 1 tablet (500 mg total) by mouth 2 (two) times daily. 180 tablet 0   No current facility-administered medications for this visit.    OBJECTIVE: White woman in no acute distress  Vitals:   11/05/19 0858  BP: (!) 147/63  Pulse: 97  Resp: 18  Temp: 98.5 F (36.9 C)  SpO2: 98%     Body mass index is 36.66 kg/m.   Wt Readings from Last 3 Encounters:  11/05/19 187 lb 11.2 oz (85.1 kg)  10/29/19 185 lb 6.4 oz (84.1 kg)  10/22/19 185 lb 12 oz (84.3 kg)  ECOG FS:1 - Symptomatic but completely ambulatory  Sclerae unicteric, EOMs intact Wearing a mask No cervical or supraclavicular adenopathy Lungs no rales or rhonchi Heart regular rate and rhythm Abd soft, nontender, positive bowel sounds MSK no focal spinal tenderness, no upper extremity lymphedema Neuro: nonfocal, well oriented, appropriate affect Breasts: I do not feel a well-defined mass in the left breast.  The right breast is benign.  Both axillae are benign.   LAB RESULTS:  CMP     Component Value Date/Time   NA 144 11/05/2019 0841   K 3.9 11/05/2019 0841   CL 112 (H) 11/05/2019 0841   CO2 25 11/05/2019 0841   GLUCOSE 111 (H) 11/05/2019 0841   BUN 16 11/05/2019 0841   CREATININE 0.72 11/05/2019 0841   CREATININE 0.94 07/17/2019 0823   CALCIUM 8.6 (L) 11/05/2019 0841   PROT 6.0 (L) 11/05/2019 0841   ALBUMIN 3.3 (L) 11/05/2019 0841   AST 18 11/05/2019 0841   AST 21 07/17/2019 0823   ALT 18 11/05/2019 0841   ALT 18 07/17/2019 0823   ALKPHOS 73 11/05/2019 0841   BILITOT 0.3 11/05/2019 0841   BILITOT  0.4 07/17/2019 0823   GFRNONAA >60 11/05/2019 0841   GFRNONAA >60 07/17/2019 0823   GFRAA >60 11/05/2019  0841   GFRAA >60 07/17/2019 0823    No results found for: TOTALPROTELP, ALBUMINELP, A1GS, A2GS, BETS, BETA2SER, GAMS, MSPIKE, SPEI  Lab Results  Component Value Date   WBC 1.4 (L) 11/05/2019   NEUTROABS 0.7 (L) 11/05/2019   HGB 9.1 (L) 11/05/2019   HCT 28.4 (L) 11/05/2019   MCV 103.6 (H) 11/05/2019   PLT 136 (L) 11/05/2019    No results found for: LABCA2  No components found for: ZHYQMV784  No results for input(s): INR in the last 168 hours.  No results found for: LABCA2  No results found for: ONG295  No results found for: MWU132  No results found for: GMW102  No results found for: CA2729  No components found for: HGQUANT  No results found for: CEA1 / No results found for: CEA1   No results found for: AFPTUMOR  No results found for: CHROMOGRNA  No results found for: KPAFRELGTCHN, LAMBDASER, KAPLAMBRATIO (kappa/lambda light chains)  No results found for: HGBA, HGBA2QUANT, HGBFQUANT, HGBSQUAN (Hemoglobinopathy evaluation)   No results found for: LDH  No results found for: IRON, TIBC, IRONPCTSAT (Iron and TIBC)  No results found for: FERRITIN  Urinalysis No results found for: COLORURINE, APPEARANCEUR, LABSPEC, PHURINE, GLUCOSEU, HGBUR, BILIRUBINUR, KETONESUR, PROTEINUR, UROBILINOGEN, NITRITE, LEUKOCYTESUR   STUDIES: No results found.   ELIGIBLE FOR AVAILABLE RESEARCH PROTOCOL: no  ASSESSMENT: 74 y.o. [place] woman status post left breast lower outer quadrant biopsy 07/09/2019 for a clinical T2 N1, stage IIIB invasive ductal carcinoma, grade 3, triple negative, with an MIB-1 of 60%  (a) chest CT scan and bone scan 07/31/2019 showed no evidence of metastatic disease  (b) MRI biopsy of 2 additional areas in the left breast (central, 07/10/2019, and at 8:30 o'clock on 07/26/2019) are both benign and concordant  (c) biopsy of 2 suspicious areas in  the right breast on 07/26/2019 (9:00 and lower outer quadrant) were benign and concordant   (1) neoadjuvant chemotherapy consisting of cyclophosphamide and doxorubicin in dose dense fashion x4 started 07/30/2019, completed 09/10/2019, followed by weekly carboplatin and paclitaxel x12 started 09/24/2019  (a) cycle 6 delayed 1 week because of anemia and thrombocytopenia  (2) definitive surgery to follow with targeted axillary lymph node dissection  (3) adjuvant radiation to follow  (4) genetics testing 07/23/2019 through the STAT Breast cancer panel offered by [facility] found no deleterious mutations in ATM, BRCA1, BRCA2, CDH1, CHEK2, PALB2, PTEN, STK11 and TP53.  Additional testing through the Common Hereditary Cancers Panel offered by [facility] also found no deleterious mutations in IPC, ATM, AXIN2, BARD1, BMPR1A, BRCA1, BRCA2, BRIP1, CDH1, CDK4, CDKN2A (p14ARF), CDKN2A (p16INK4a), CHEK2, CTNNA1, DICER1, EPCAM (Deletion/duplication testing only), GREM1 (promoter region deletion/duplication testing only), KIT, MEN1, MLH1, MSH2, MSH3, MSH6, MUTYH, NBN, NF1, NHTL1, PALB2, PDGFRA, PMS2, POLD1, POLE, PTEN, RAD50, RAD51C, RAD51D, RNF43, SDHB, SDHC, SDHD, SMAD4, SMARCA4. STK11, TP53, TSC1, TSC2, and VHL.  The following genes were evaluated for sequence changes only: SDHA and HOXB13 c.251G>A variant only.   (a) 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    PLAN: Julie Thompson has developed significant cytopenias.  She did respond nicely to the Red cell transfusion last week, but her Avery today is only 0.7.  It really needs to be at least 1.0 before I can treat her.  We are going to try to get Zarxio for her to be received on days 3 4 and 5 of each subsequent treatment.  I am also entering an order for an MRI.  Clinically  she has had a very good response.  If she has a complete radiologic response we can consider proceeding to surgery  I did discuss the S14 18 study with her.   She understands we have some data that pembrolizumab can be helpful in triple negative patients treated neoadjuvantly who have at least 1cm residual disease or a positive lymph node.  She understands the study is randomized on half the patients to get observation only.  She would be very interested in participating if she does have some residual disease.  I will confirm with our research nurses that she qualifies so that we do not pull the port at the time of definitive surgery  She will see me again next week.  She knows to call for any other issue that may develop before that visit  Total encounter time 40 minutes.Sarajane Jews C. Amiaya Mcneeley, MD 11/05/19 10:07 AM Medical Oncology and Hematology [facility] [place], [place] [address] Tel. [phone]    Fax. [phone]   I, Wilburn Mylar, am acting as scribe for Dr. Virgie Dad. Gaspard Isbell.  I, Lurline Del MD, have reviewed the above documentation for accuracy and completeness, and I agree with the above.    *Total Encounter Time as defined by the Centers for Medicare and Medicaid Services includes, in addition to the face-to-face time of a patient visit (documented in the note above) non-face-to-face time: obtaining and reviewing outside history, ordering and reviewing medications, tests or procedures, care coordination (communications with other health care professionals or caregivers) and documentation in the medical record.

## 2019-11-05 ENCOUNTER — Inpatient Hospital Stay: Payer: Medicare Other

## 2019-11-05 ENCOUNTER — Inpatient Hospital Stay: Payer: Medicare Other | Attending: Oncology

## 2019-11-05 ENCOUNTER — Encounter: Payer: Self-pay | Admitting: *Deleted

## 2019-11-05 ENCOUNTER — Other Ambulatory Visit: Payer: Self-pay

## 2019-11-05 ENCOUNTER — Inpatient Hospital Stay: Payer: Medicare Other | Admitting: Oncology

## 2019-11-05 VITALS — BP 147/63 | HR 97 | Temp 98.5°F | Resp 18 | Ht 60.0 in | Wt 187.7 lb

## 2019-11-05 DIAGNOSIS — D701 Agranulocytosis secondary to cancer chemotherapy: Secondary | ICD-10-CM | POA: Insufficient documentation

## 2019-11-05 DIAGNOSIS — Z7982 Long term (current) use of aspirin: Secondary | ICD-10-CM | POA: Insufficient documentation

## 2019-11-05 DIAGNOSIS — D696 Thrombocytopenia, unspecified: Secondary | ICD-10-CM | POA: Diagnosis not present

## 2019-11-05 DIAGNOSIS — Z8049 Family history of malignant neoplasm of other genital organs: Secondary | ICD-10-CM | POA: Diagnosis not present

## 2019-11-05 DIAGNOSIS — Z90710 Acquired absence of both cervix and uterus: Secondary | ICD-10-CM | POA: Diagnosis not present

## 2019-11-05 DIAGNOSIS — Z8249 Family history of ischemic heart disease and other diseases of the circulatory system: Secondary | ICD-10-CM | POA: Insufficient documentation

## 2019-11-05 DIAGNOSIS — Z803 Family history of malignant neoplasm of breast: Secondary | ICD-10-CM | POA: Insufficient documentation

## 2019-11-05 DIAGNOSIS — Z8349 Family history of other endocrine, nutritional and metabolic diseases: Secondary | ICD-10-CM | POA: Diagnosis not present

## 2019-11-05 DIAGNOSIS — C773 Secondary and unspecified malignant neoplasm of axilla and upper limb lymph nodes: Secondary | ICD-10-CM | POA: Insufficient documentation

## 2019-11-05 DIAGNOSIS — Z833 Family history of diabetes mellitus: Secondary | ICD-10-CM | POA: Insufficient documentation

## 2019-11-05 DIAGNOSIS — Z800 Family history of malignant neoplasm of digestive organs: Secondary | ICD-10-CM | POA: Insufficient documentation

## 2019-11-05 DIAGNOSIS — Z5111 Encounter for antineoplastic chemotherapy: Secondary | ICD-10-CM | POA: Insufficient documentation

## 2019-11-05 DIAGNOSIS — Z79899 Other long term (current) drug therapy: Secondary | ICD-10-CM | POA: Insufficient documentation

## 2019-11-05 DIAGNOSIS — Z791 Long term (current) use of non-steroidal anti-inflammatories (NSAID): Secondary | ICD-10-CM | POA: Insufficient documentation

## 2019-11-05 DIAGNOSIS — Z8051 Family history of malignant neoplasm of kidney: Secondary | ICD-10-CM | POA: Insufficient documentation

## 2019-11-05 DIAGNOSIS — J45909 Unspecified asthma, uncomplicated: Secondary | ICD-10-CM | POA: Diagnosis not present

## 2019-11-05 DIAGNOSIS — Z806 Family history of leukemia: Secondary | ICD-10-CM | POA: Diagnosis not present

## 2019-11-05 DIAGNOSIS — C50512 Malignant neoplasm of lower-outer quadrant of left female breast: Secondary | ICD-10-CM

## 2019-11-05 DIAGNOSIS — Z171 Estrogen receptor negative status [ER-]: Secondary | ICD-10-CM | POA: Diagnosis not present

## 2019-11-05 DIAGNOSIS — Z1589 Genetic susceptibility to other disease: Secondary | ICD-10-CM | POA: Insufficient documentation

## 2019-11-05 DIAGNOSIS — Z8041 Family history of malignant neoplasm of ovary: Secondary | ICD-10-CM | POA: Insufficient documentation

## 2019-11-05 DIAGNOSIS — Z808 Family history of malignant neoplasm of other organs or systems: Secondary | ICD-10-CM | POA: Insufficient documentation

## 2019-11-05 DIAGNOSIS — Z95828 Presence of other vascular implants and grafts: Secondary | ICD-10-CM

## 2019-11-05 LAB — COMPREHENSIVE METABOLIC PANEL
ALT: 18 U/L (ref 0–44)
AST: 18 U/L (ref 15–41)
Albumin: 3.3 g/dL — ABNORMAL LOW (ref 3.5–5.0)
Alkaline Phosphatase: 73 U/L (ref 38–126)
Anion gap: 7 (ref 5–15)
BUN: 16 mg/dL (ref 8–23)
CO2: 25 mmol/L (ref 22–32)
Calcium: 8.6 mg/dL — ABNORMAL LOW (ref 8.9–10.3)
Chloride: 112 mmol/L — ABNORMAL HIGH (ref 98–111)
Creatinine, Ser: 0.72 mg/dL (ref 0.44–1.00)
GFR calc Af Amer: >60 mL/min (ref >60)
GFR calc non Af Amer: >60 mL/min (ref >60)
Glucose, Bld: 111 mg/dL — ABNORMAL HIGH (ref 70–99)
Potassium: 3.9 mmol/L (ref 3.5–5.1)
Sodium: 144 mmol/L (ref 135–145)
Total Bilirubin: 0.3 mg/dL (ref 0.3–1.2)
Total Protein: 6 g/dL — ABNORMAL LOW (ref 6.5–8.1)

## 2019-11-05 LAB — CBC WITH DIFFERENTIAL/PLATELET
Abs Immature Granulocytes: 0 10*3/uL (ref 0.00–0.07)
Basophils Absolute: 0 10*3/uL (ref 0.0–0.1)
Basophils Relative: 1 %
Eosinophils Absolute: 0 10*3/uL (ref 0.0–0.5)
Eosinophils Relative: 1 %
HCT: 28.4 % — ABNORMAL LOW (ref 36.0–46.0)
Hemoglobin: 9.1 g/dL — ABNORMAL LOW (ref 12.0–15.0)
Immature Granulocytes: 0 %
Lymphocytes Relative: 28 %
Lymphs Abs: 0.4 10*3/uL — ABNORMAL LOW (ref 0.7–4.0)
MCH: 33.2 pg (ref 26.0–34.0)
MCHC: 32 g/dL (ref 30.0–36.0)
MCV: 103.6 fL — ABNORMAL HIGH (ref 80.0–100.0)
Monocytes Absolute: 0.2 10*3/uL (ref 0.1–1.0)
Monocytes Relative: 16 %
Neutro Abs: 0.7 10*3/uL — ABNORMAL LOW (ref 1.7–7.7)
Neutrophils Relative %: 54 %
Platelets: 136 10*3/uL — ABNORMAL LOW (ref 150–400)
RBC: 2.74 MIL/uL — ABNORMAL LOW (ref 3.87–5.11)
RDW: 20.2 % — ABNORMAL HIGH (ref 11.5–15.5)
WBC: 1.4 10*3/uL — ABNORMAL LOW (ref 4.0–10.5)
nRBC: 0 % (ref 0.0–0.2)

## 2019-11-05 MED ORDER — SODIUM CHLORIDE 0.9% FLUSH
10.0000 mL | Freq: Once | INTRAVENOUS | Status: AC
  Administered 2019-11-05: 10 mL
  Filled 2019-11-05: qty 10

## 2019-11-06 ENCOUNTER — Telehealth: Payer: Self-pay | Admitting: Oncology

## 2019-11-06 NOTE — Telephone Encounter (Signed)
Scheduled appts per 5/4 los. Pt confirmed appt date and time.

## 2019-11-06 NOTE — Progress Notes (Signed)
Pharmacist Chemotherapy Monitoring - Follow Up Assessment    I verify that I have reviewed each item in the below checklist:  . Regimen for the patient is scheduled for the appropriate day and plan matches scheduled date. Marland Kitchen Appropriate non-routine labs are ordered dependent on drug ordered. . If applicable, additional medications reviewed and ordered per protocol based on lifetime cumulative doses and/or treatment regimen.   Plan for follow-up and/or issues identified: No . I-vent associated with next due treatment: No . MD and/or nursing notified: No   Kennith Center, Pharm.D., CPP 11/06/2019@3 :30 PM

## 2019-11-12 ENCOUNTER — Other Ambulatory Visit: Payer: Self-pay

## 2019-11-12 ENCOUNTER — Encounter: Payer: Self-pay | Admitting: *Deleted

## 2019-11-12 ENCOUNTER — Encounter: Payer: Self-pay | Admitting: Adult Health

## 2019-11-12 ENCOUNTER — Inpatient Hospital Stay: Payer: Medicare Other

## 2019-11-12 ENCOUNTER — Inpatient Hospital Stay: Payer: Medicare Other | Admitting: Adult Health

## 2019-11-12 VITALS — BP 157/73 | HR 88 | Temp 98.9°F | Resp 20 | Ht 60.0 in | Wt 190.7 lb

## 2019-11-12 DIAGNOSIS — Z171 Estrogen receptor negative status [ER-]: Secondary | ICD-10-CM

## 2019-11-12 DIAGNOSIS — D696 Thrombocytopenia, unspecified: Secondary | ICD-10-CM | POA: Diagnosis not present

## 2019-11-12 DIAGNOSIS — Z806 Family history of leukemia: Secondary | ICD-10-CM | POA: Diagnosis not present

## 2019-11-12 DIAGNOSIS — Z8051 Family history of malignant neoplasm of kidney: Secondary | ICD-10-CM | POA: Diagnosis not present

## 2019-11-12 DIAGNOSIS — C50512 Malignant neoplasm of lower-outer quadrant of left female breast: Secondary | ICD-10-CM

## 2019-11-12 DIAGNOSIS — J45909 Unspecified asthma, uncomplicated: Secondary | ICD-10-CM | POA: Diagnosis not present

## 2019-11-12 DIAGNOSIS — Z95828 Presence of other vascular implants and grafts: Secondary | ICD-10-CM

## 2019-11-12 DIAGNOSIS — Z90710 Acquired absence of both cervix and uterus: Secondary | ICD-10-CM | POA: Diagnosis not present

## 2019-11-12 DIAGNOSIS — C773 Secondary and unspecified malignant neoplasm of axilla and upper limb lymph nodes: Secondary | ICD-10-CM | POA: Diagnosis not present

## 2019-11-12 DIAGNOSIS — Z7982 Long term (current) use of aspirin: Secondary | ICD-10-CM | POA: Diagnosis not present

## 2019-11-12 DIAGNOSIS — Z803 Family history of malignant neoplasm of breast: Secondary | ICD-10-CM | POA: Diagnosis not present

## 2019-11-12 DIAGNOSIS — D701 Agranulocytosis secondary to cancer chemotherapy: Secondary | ICD-10-CM | POA: Diagnosis not present

## 2019-11-12 DIAGNOSIS — Z5111 Encounter for antineoplastic chemotherapy: Secondary | ICD-10-CM | POA: Diagnosis not present

## 2019-11-12 DIAGNOSIS — Z800 Family history of malignant neoplasm of digestive organs: Secondary | ICD-10-CM | POA: Diagnosis not present

## 2019-11-12 DIAGNOSIS — Z791 Long term (current) use of non-steroidal anti-inflammatories (NSAID): Secondary | ICD-10-CM | POA: Diagnosis not present

## 2019-11-12 DIAGNOSIS — Z1589 Genetic susceptibility to other disease: Secondary | ICD-10-CM | POA: Diagnosis not present

## 2019-11-12 DIAGNOSIS — Z79899 Other long term (current) drug therapy: Secondary | ICD-10-CM | POA: Diagnosis not present

## 2019-11-12 LAB — CBC WITH DIFFERENTIAL/PLATELET
Abs Immature Granulocytes: 0.03 10*3/uL (ref 0.00–0.07)
Basophils Absolute: 0 10*3/uL (ref 0.0–0.1)
Basophils Relative: 0 %
Eosinophils Absolute: 0 10*3/uL (ref 0.0–0.5)
Eosinophils Relative: 1 %
HCT: 29.5 % — ABNORMAL LOW (ref 36.0–46.0)
Hemoglobin: 9.4 g/dL — ABNORMAL LOW (ref 12.0–15.0)
Immature Granulocytes: 1 %
Lymphocytes Relative: 20 %
Lymphs Abs: 0.5 10*3/uL — ABNORMAL LOW (ref 0.7–4.0)
MCH: 33.9 pg (ref 26.0–34.0)
MCHC: 31.9 g/dL (ref 30.0–36.0)
MCV: 106.5 fL — ABNORMAL HIGH (ref 80.0–100.0)
Monocytes Absolute: 0.4 10*3/uL (ref 0.1–1.0)
Monocytes Relative: 17 %
Neutro Abs: 1.5 10*3/uL — ABNORMAL LOW (ref 1.7–7.7)
Neutrophils Relative %: 61 %
Platelets: 223 10*3/uL (ref 150–400)
RBC: 2.77 MIL/uL — ABNORMAL LOW (ref 3.87–5.11)
RDW: 19.9 % — ABNORMAL HIGH (ref 11.5–15.5)
WBC: 2.6 10*3/uL — ABNORMAL LOW (ref 4.0–10.5)
nRBC: 0.8 % — ABNORMAL HIGH (ref 0.0–0.2)

## 2019-11-12 LAB — COMPREHENSIVE METABOLIC PANEL
ALT: 17 U/L (ref 0–44)
AST: 19 U/L (ref 15–41)
Albumin: 3.3 g/dL — ABNORMAL LOW (ref 3.5–5.0)
Alkaline Phosphatase: 85 U/L (ref 38–126)
Anion gap: 9 (ref 5–15)
BUN: 10 mg/dL (ref 8–23)
CO2: 23 mmol/L (ref 22–32)
Calcium: 8.6 mg/dL — ABNORMAL LOW (ref 8.9–10.3)
Chloride: 113 mmol/L — ABNORMAL HIGH (ref 98–111)
Creatinine, Ser: 0.73 mg/dL (ref 0.44–1.00)
GFR calc Af Amer: >60 mL/min (ref >60)
GFR calc non Af Amer: >60 mL/min (ref >60)
Glucose, Bld: 118 mg/dL — ABNORMAL HIGH (ref 70–99)
Potassium: 3.7 mmol/L (ref 3.5–5.1)
Sodium: 145 mmol/L (ref 135–145)
Total Bilirubin: 0.3 mg/dL (ref 0.3–1.2)
Total Protein: 6 g/dL — ABNORMAL LOW (ref 6.5–8.1)

## 2019-11-12 MED ORDER — PALONOSETRON HCL INJECTION 0.25 MG/5ML
0.2500 mg | Freq: Once | INTRAVENOUS | Status: AC
  Administered 2019-11-12: 0.25 mg via INTRAVENOUS

## 2019-11-12 MED ORDER — SODIUM CHLORIDE 0.9 % IV SOLN
Freq: Once | INTRAVENOUS | Status: AC
  Filled 2019-11-12: qty 250

## 2019-11-12 MED ORDER — SODIUM CHLORIDE 0.9% FLUSH
10.0000 mL | INTRAVENOUS | Status: DC | PRN
  Administered 2019-11-12: 10 mL
  Filled 2019-11-12: qty 10

## 2019-11-12 MED ORDER — FAMOTIDINE IN NACL 20-0.9 MG/50ML-% IV SOLN
20.0000 mg | Freq: Once | INTRAVENOUS | Status: AC
  Administered 2019-11-12: 20 mg via INTRAVENOUS

## 2019-11-12 MED ORDER — FAMOTIDINE IN NACL 20-0.9 MG/50ML-% IV SOLN
INTRAVENOUS | Status: AC
  Filled 2019-11-12: qty 50

## 2019-11-12 MED ORDER — SODIUM CHLORIDE 0.9 % IV SOLN
10.0000 mg | Freq: Once | INTRAVENOUS | Status: AC
  Administered 2019-11-12: 10 mg via INTRAVENOUS
  Filled 2019-11-12: qty 10

## 2019-11-12 MED ORDER — SODIUM CHLORIDE 0.9% FLUSH
10.0000 mL | INTRAVENOUS | Status: DC | PRN
  Administered 2019-11-12: 10 mL via INTRAVENOUS
  Filled 2019-11-12: qty 10

## 2019-11-12 MED ORDER — SODIUM CHLORIDE 0.9 % IV SOLN
182.8000 mg | Freq: Once | INTRAVENOUS | Status: AC
  Administered 2019-11-12: 180 mg via INTRAVENOUS
  Filled 2019-11-12: qty 18

## 2019-11-12 MED ORDER — DIPHENHYDRAMINE HCL 50 MG/ML IJ SOLN
INTRAMUSCULAR | Status: AC
  Filled 2019-11-12: qty 1

## 2019-11-12 MED ORDER — DIPHENHYDRAMINE HCL 50 MG/ML IJ SOLN
50.0000 mg | Freq: Once | INTRAMUSCULAR | Status: AC
  Administered 2019-11-12: 50 mg via INTRAVENOUS

## 2019-11-12 MED ORDER — HEPARIN SOD (PORK) LOCK FLUSH 100 UNIT/ML IV SOLN
500.0000 [IU] | Freq: Once | INTRAVENOUS | Status: AC | PRN
  Administered 2019-11-12: 500 [IU]
  Filled 2019-11-12: qty 5

## 2019-11-12 MED ORDER — SODIUM CHLORIDE 0.9 % IV SOLN
80.0000 mg/m2 | Freq: Once | INTRAVENOUS | Status: AC
  Administered 2019-11-12: 150 mg via INTRAVENOUS
  Filled 2019-11-12: qty 25

## 2019-11-12 MED ORDER — PALONOSETRON HCL INJECTION 0.25 MG/5ML
INTRAVENOUS | Status: AC
  Filled 2019-11-12: qty 5

## 2019-11-12 MED ORDER — SODIUM CHLORIDE 0.9 % IV SOLN
150.0000 mg | Freq: Once | INTRAVENOUS | Status: AC
  Administered 2019-11-12: 150 mg via INTRAVENOUS
  Filled 2019-11-12: qty 150

## 2019-11-12 NOTE — Patient Instructions (Signed)
[facility] Discharge Instructions for Patients Receiving Chemotherapy  Today you received the following chemotherapy agents: taxol, carboplatin   To help prevent nausea and vomiting after your treatment, we encourage you to take your nausea medication as directed.    If you develop nausea and vomiting that is not controlled by your nausea medication, call the clinic.   BELOW ARE SYMPTOMS THAT SHOULD BE REPORTED IMMEDIATELY:  *FEVER GREATER THAN 100.5 F  *CHILLS WITH OR WITHOUT FEVER  NAUSEA AND VOMITING THAT IS NOT CONTROLLED WITH YOUR NAUSEA MEDICATION  *UNUSUAL SHORTNESS OF BREATH  *UNUSUAL BRUISING OR BLEEDING  TENDERNESS IN MOUTH AND THROAT WITH OR WITHOUT PRESENCE OF ULCERS  *URINARY PROBLEMS  *BOWEL PROBLEMS  UNUSUAL RASH Items with * indicate a potential emergency and should be followed up as soon as possible.  Feel free to call the clinic should you have any questions or concerns. The clinic phone number is [phone].  Please show the CHEMO ALERT CARD at check-in to the Emergency Department and triage nurse.   

## 2019-11-12 NOTE — Progress Notes (Signed)
[facility]  [phone] [phone]     ID: Julie Thompson DOB: 1946/01/20  MR#: [id number]  YTK#:[id number]  Patient Care Team: Leeroy Cha, MD as PCP - General (Internal Medicine) Rockwell Germany, RN as Oncology Nurse Navigator Mauro Kaufmann, RN as Oncology Nurse Navigator Eppie Gibson, MD as Attending Physician (Radiation Oncology) Donnie Mesa, MD as Consulting Physician (General Surgery) Magrinat, Virgie Dad, MD as Consulting Physician (Oncology) Scot Dock, NP OTHER MD:  CHIEF COMPLAINT: triple negative breast cancer  CURRENT TREATMENT: Neoadjuvant chemotherapy   INTERVAL HISTORY: Julie Thompson returns today for follow up and treatment of her triple negative breast cancer.   She completed the earlier part of her chemotherapy with cyclophosphamide and doxorubicin and started on weekly Paclitaxel and carboplatin .  Today she will receive his sixth of 12 weekly doses of Paclitaxel and Carboplatin.  She is tolerating this well.  She denies any peripheral neuropathy.   Her chemotherapy has been held for the past two weeks, first, due to thrombocytopenia, and second due to neutropenia.  Her counts have recovered.     REVIEW OF SYSTEMS: Julie Thompson going to undergo MRI breast in a few days.  She is also going to undergo PET scan on 11/21/2019.    Julie Thompson denies peripheral neuropathy.  She has cramping in her right fifth digit, and aching in her knees.  She denies any fever, chills, chest pain, palpitations, cough, shortness of breath, bowel/bladder changes, or any other concerns.  A detailed ROS was otherwise non contributory.      HISTORY OF CURRENT ILLNESS: From the original intake note:  Julie Thompson had routine screening mammography on 06/21/2019 showing a possible abnormality in the left breast. She underwent left diagnostic mammography with tomography and left breast ultrasonography at [facility] on 07/01/2019 showing:  breast density category C; either 2 adjacent masses or 1 complex dumbbell-shaped mass containing calcifications in the left breast at 6 o'clock, the largest of which measures 2.1 cm; chest wall invasion not excluded; 1-2 mm group of calcifications just anterior to the mass(es); single abnormal lymph node in the left axilla; vascular calcifications in anterior left breast.  Accordingly on 07/09/2019 she proceeded to biopsy of the left breast areas in question. The pathology from this procedure ([id number]) showed:  1. Left Breast, 5:30, posterior  - invasive ductal carcinoma with necrosis, grade 3  - Prognostic indicators significant for: estrogen receptor, 0% negative and progesterone receptor, 0% negative. Proliferation marker Ki67 at 60%. HER2 negative by immunohistochemistry (0). 2. Left Breast, 5:30, posterior  - invasive ductal carcinoma with necrosis, grade 3  - ductal carcinoma in situ, intermediate grade 3. Left Axilla, lymph node (1)  - metastatic carcinoma involving a lymph node  And additional biopsy of the anterior vascular calcifications in the left breast was performed on 07/10/2019. Pathology [id number]) showed: fibrocystic changes with calcifications; no malignancy identified.  This was felt to be concordant.  The patient's subsequent history is as detailed below.   PAST MEDICAL HISTORY: Past Medical History:  Diagnosis Date  . Asthma    no problems now  . Family history of basal cell carcinoma   . Family history of breast cancer   . Family history of colon cancer   . Family history of kidney cancer   . Family history of leukemia   . Family history of peritoneal cancer   . GERD (gastroesophageal reflux disease)   . H/O hiatal hernia   . Hyperlipidemia   .  Hypothyroidism   . IBS (irritable bowel syndrome)   . lt breast ca dx'd 07/10/19    PAST SURGICAL HISTORY: Past Surgical History:  Procedure Laterality Date  . ABDOMINAL HYSTERECTOMY     age 41  . BREAST SURGERY      . DILATION AND CURETTAGE OF UTERUS     2 miscarriages age 57 & 16  . FLEXIBLE SIGMOIDOSCOPY N/A 10/22/2013   Procedure: FLEXIBLE SIGMOIDOSCOPY;  Surgeon: Garlan Fair, MD;  Location: [facility];  Service: Endoscopy;  Laterality: N/A;  . KNEE ARTHROSCOPY Left   . PORTACATH PLACEMENT Right 07/29/2019   Procedure: INSERTION PORT-A-CATH WITH ULTRASOUND;  Surgeon: Donnie Mesa, MD;  Location: [facility];  Service: General;  Laterality: Right;  . TONSILLECTOMY     age 50    FAMILY HISTORY: Family History  Problem Relation Age of Onset  . Breast cancer Maternal Aunt        dx. in her 39s  . Cancer Mother 61       peritoneal cancer, death certificate lists ovarian cancer  . Colon cancer Paternal Uncle        dx. late 33s or early 8s  . Basal cell carcinoma Sister 68       a second diagnosed in her 31s  . Kidney cancer Maternal Grandmother 61  . Diabetes Paternal Grandmother   . Heart Problems Paternal Grandmother   . Leukemia Paternal Uncle        dx. early 35s   Patient's father is currently living at age 44 as of Aug 11, 2019. Patient's mother died from ovarian cancer at age 31. She was diagnosed at age 46. The patient reports breast cancer in a maternal aunt, diagnosed in her 49's. The patient also noted colon cancer in a paternal uncle and kidney cancer in her maternal grandmother.  She has 1 sister.   GYNECOLOGIC HISTORY:  No LMP recorded. Patient has had a hysterectomy. Menarche: [age] years old Age at first live birth: 74 years old [place] P 2 LMP age 72 (with hysterectomy) Contraceptive: never used HRT used for 10-12 years  Hysterectomy? Yes, age 60 BSO? 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  Husband [name] is a retired Psychologist, prison and probation services. She lives at home with husband Butch. She has two children from a prior marriage. Son [name], age 69, works as a Games developer in  [place], [place]. Son [name], age 53, works as a Programme researcher, broadcasting/film/video in [place], [place]. The patient has 4 grandchildren and 4 great-grandchildren. She attends [employer].    ADVANCED DIRECTIVES: In the absence of any documentation to the contrary, the patient's spouse is their HCPOA.    HEALTH MAINTENANCE: Social History   Tobacco Use  . Smoking status: Never Smoker  . Smokeless tobacco: Never Used  Substance Use Topics  . Alcohol use: No  . Drug use: No     Colonoscopy: 09/2013, with CT virtual 11/2013  PAP: none on file, s/p hysterectomy  Bone density: 04/2001, -0.5   Allergies  Allergen Reactions  . Sulfa Antibiotics Rash  . Penicillins Hives    Current Outpatient Medications  Medication Sig Dispense Refill  . aspirin EC 81 MG tablet Take 81 mg by mouth daily.    Marland Kitchen atorvastatin (LIPITOR) 20 MG tablet Take 20 mg by mouth daily.    . Cholecalciferol (VITAMIN D) 2000 UNITS tablet Take 2,000 Units by mouth daily.    Marland Kitchen  co-enzyme Q-10 30 MG capsule Take 20 mg by mouth 3 (three) times daily.    . esomeprazole (NEXIUM) 20 MG capsule Take 20 mg by mouth daily at 12 noon.    . famotidine (PEPCID) 20 MG tablet Take 40 mg by mouth 2 (two) times daily.    . FIBER PO Take 1 tablet by mouth daily.    . fluconazole (DIFLUCAN) 200 MG tablet Take 1 tablet (200 mg total) by mouth daily. 30 tablet 0  . levothyroxine (SYNTHROID) 50 MCG tablet Take 50 mcg by mouth daily.    . lidocaine-prilocaine (EMLA) cream Apply 1 application topically as needed. 30 g 0  . loratadine (CLARITIN) 10 MG tablet Take 1 tablet (10 mg total) by mouth daily. 60 tablet 1  . LORazepam (ATIVAN) 0.5 MG tablet TAKE 1 TABLET(0.5 MG) BY MOUTH AT BEDTIME AS NEEDED FOR NAUSEA OR VOMITING 30 tablet 0  . Multiple Vitamin (MULTIVITAMIN WITH MINERALS) TABS tablet Take 1 tablet by mouth daily.    . nabumetone (RELAFEN) 500 MG tablet Take 500 mg by mouth 2 (two) times daily as needed.    . Omega-3 Fatty Acids (FISH OIL) 1200  MG CAPS Take 1,200 mg by mouth daily.    . prochlorperazine (COMPAZINE) 10 MG tablet Take 1 tablet (10 mg total) by mouth every 6 (six) hours as needed (Nausea or vomiting). 30 tablet 1  . valACYclovir (VALTREX) 500 MG tablet Take 1 tablet (500 mg total) by mouth 2 (two) times daily. 180 tablet 0  . HYDROcodone-acetaminophen (NORCO/VICODIN) 5-325 MG tablet Take 1 tablet by mouth every 6 (six) hours as needed for moderate pain. (Patient not taking: Reported on 10/29/2019) 15 tablet 0   No current facility-administered medications for this visit.    OBJECTIVE:   Vitals:   11/12/19 0902  BP: (!) 157/73  Pulse: 88  Resp: 20  Temp: 98.9 F (37.2 C)  SpO2: 100%     Body mass index is 37.24 kg/m.   Wt Readings from Last 3 Encounters:  11/12/19 190 lb 11.2 oz (86.5 kg)  11/05/19 187 lb 11.2 oz (85.1 kg)  10/29/19 185 lb 6.4 oz (84.1 kg)  ECOG FS:1 - Symptomatic but completely ambulatory GENERAL: Patient is a well appearing female in no acute distress HEENT:  Sclerae anicteric.  Mask in place. Neck is supple.  NODES:  No cervical, supraclavicular, or axillary lymphadenopathy palpated.  BREAST EXAM:  Deferred. LUNGS:  Clear to auscultation bilaterally.  No wheezes or rhonchi. HEART:  Regular rate and rhythm. No murmur appreciated. ABDOMEN:  Soft, nontender.  Positive, normoactive bowel sounds. No organomegaly palpated. MSK:  No focal spinal tenderness to palpation.  EXTREMITIES:  No peripheral edema.   SKIN:  Clear with no obvious rashes or skin changes. Darkening at nail beds. NEURO:  Nonfocal. Well oriented.  Appropriate affect.     LAB RESULTS:  CMP     Component Value Date/Time   NA 144 11/05/2019 0841   K 3.9 11/05/2019 0841   CL 112 (H) 11/05/2019 0841   CO2 25 11/05/2019 0841   GLUCOSE 111 (H) 11/05/2019 0841   BUN 16 11/05/2019 0841   CREATININE 0.72 11/05/2019 0841   CREATININE 0.94 07/17/2019 0823   CALCIUM 8.6 (L) 11/05/2019 0841   PROT 6.0 (L) 11/05/2019 0841    ALBUMIN 3.3 (L) 11/05/2019 0841   AST 18 11/05/2019 0841   AST 21 07/17/2019 0823   ALT 18 11/05/2019 0841   ALT 18 07/17/2019 0823   ALKPHOS   73 11/05/2019 0841   BILITOT 0.3 11/05/2019 0841   BILITOT 0.4 07/17/2019 0823   GFRNONAA >60 11/05/2019 0841   GFRNONAA >60 07/17/2019 0823   GFRAA >60 11/05/2019 0841   GFRAA >60 07/17/2019 0823    No results found for: TOTALPROTELP, ALBUMINELP, A1GS, A2GS, BETS, BETA2SER, GAMS, MSPIKE, SPEI  Lab Results  Component Value Date   WBC 2.6 (L) 11/12/2019   NEUTROABS 1.5 (L) 11/12/2019   HGB 9.4 (L) 11/12/2019   HCT 29.5 (L) 11/12/2019   MCV 106.5 (H) 11/12/2019   PLT 223 11/12/2019    No results found for: LABCA2  No components found for: LABCAN125  No results for input(s): INR in the last 168 hours.  No results found for: LABCA2  No results found for: CAN199  No results found for: CAN125  No results found for: CAN153  No results found for: CA2729  No components found for: HGQUANT  No results found for: CEA1 / No results found for: CEA1   No results found for: AFPTUMOR  No results found for: CHROMOGRNA  No results found for: KPAFRELGTCHN, LAMBDASER, KAPLAMBRATIO (kappa/lambda light chains)  No results found for: HGBA, HGBA2QUANT, HGBFQUANT, HGBSQUAN (Hemoglobinopathy evaluation)   No results found for: LDH  No results found for: IRON, TIBC, IRONPCTSAT (Iron and TIBC)  No results found for: FERRITIN  Urinalysis No results found for: COLORURINE, APPEARANCEUR, LABSPEC, PHURINE, GLUCOSEU, HGBUR, BILIRUBINUR, KETONESUR, PROTEINUR, UROBILINOGEN, NITRITE, LEUKOCYTESUR   STUDIES: No results found.   ELIGIBLE FOR AVAILABLE RESEARCH PROTOCOL: no  ASSESSMENT: 73 y.o. [place] woman status post left breast lower outer quadrant biopsy 07/09/2019 for a clinical T2 N1, stage IIIB invasive ductal carcinoma, grade 3, triple negative, with an MIB-1 of 60%  (a) chest CT scan and bone scan 07/31/2019 showed no evidence of  metastatic disease  (b) MRI biopsy of 2 additional areas in the left breast (central, 07/10/2019, and at 8:30 o'clock on 07/26/2019) are both benign and concordant  (c) biopsy of 2 suspicious areas in the right breast on 07/26/2019 (9:00 and lower outer quadrant) were benign and concordant   (1) neoadjuvant chemotherapy consisting of cyclophosphamide and doxorubicin in dose dense fashion x4 started 07/30/2019, completed 09/10/2019, followed by weekly carboplatin and paclitaxel x12 started 09/24/2019  (a) cycle 6 delayed 2 weeks because of anemia and thrombocytopenia, neutropenia  (2) definitive surgery to follow with targeted axillary lymph node dissection  (3) adjuvant radiation to follow  (4) genetics testing 07/23/2019 through the STAT Breast cancer panel offered by [facility] found no deleterious mutations in ATM, BRCA1, BRCA2, CDH1, CHEK2, PALB2, PTEN, STK11 and TP53.  Additional testing through the Common Hereditary Cancers Panel offered by [facility] also found no deleterious mutations in IPC, ATM, AXIN2, BARD1, BMPR1A, BRCA1, BRCA2, BRIP1, CDH1, CDK4, CDKN2A (p14ARF), CDKN2A (p16INK4a), CHEK2, CTNNA1, DICER1, EPCAM (Deletion/duplication testing only), GREM1 (promoter region deletion/duplication testing only), KIT, MEN1, MLH1, MSH2, MSH3, MSH6, MUTYH, NBN, NF1, NHTL1, PALB2, PDGFRA, PMS2, POLD1, POLE, PTEN, RAD50, RAD51C, RAD51D, RNF43, SDHB, SDHC, SDHD, SMAD4, SMARCA4. STK11, TP53, TSC1, TSC2, and VHL.  The following genes were evaluated for sequence changes only: SDHA and HOXB13 c.251G>A variant only.   (a) 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    PLAN: Athalee is doing well today. Her labs have recovered.  Her breast mass is no longer palpable.  She will proceed with her chemotherapy today with Paclitaxel and Carboplatin given weekly.  She will receive Granix for WBC support.  I reviewed   this with her in detail and how it works.  She understands and  will get this for three days.  On 5/15 after her third injection she will have the MRI of her breasts.  We are doing this early to determine how her breasts are doing, as we may need to proceed to surgery if her labs continue to have difficulty recovering after treatment.  She will also undergo PET scan on 11/21/2019.  She will return in one week for labs, f/u and her next chemotherapy.  She was recommended to continue with the appropriate pandemic precautions. She knows to call for any questions that may arise between now and her next appointment.  We are happy to see her sooner if needed.  Total encounter time 20 minutes.Wilber Bihari, NP 11/12/19 9:15 AM Medical Oncology and Hematology [facility] [place], [place] [address] Tel. [phone]    Fax. [phone]   *Total Encounter Time as defined by the Centers for Medicare and Medicaid Services includes, in addition to the face-to-face time of a patient visit (documented in the note above) non-face-to-face time: obtaining and reviewing outside history, ordering and reviewing medications, tests or procedures, care coordination (communications with other health care professionals or caregivers) and documentation in the medical record.

## 2019-11-13 ENCOUNTER — Telehealth: Payer: Self-pay | Admitting: Adult Health

## 2019-11-13 ENCOUNTER — Telehealth: Payer: Self-pay | Admitting: Nurse Practitioner

## 2019-11-13 NOTE — Progress Notes (Signed)
Pharmacist Chemotherapy Monitoring - Follow Up Assessment    I verify that I have reviewed each item in the below checklist:  . Regimen for the patient is scheduled for the appropriate day and plan matches scheduled date. Marland Kitchen Appropriate non-routine labs are ordered dependent on drug ordered. . If applicable, additional medications reviewed and ordered per protocol based on lifetime cumulative doses and/or treatment regimen.   Plan for follow-up and/or issues identified: No . I-vent associated with next due treatment: No . MD and/or nursing notified: No  Shekela Goodridge D 11/13/2019 3:49 PM

## 2019-11-13 NOTE — Telephone Encounter (Signed)
No 5/11 los. No changes made to pt's schedule.  

## 2019-11-14 ENCOUNTER — Inpatient Hospital Stay: Payer: Medicare Other

## 2019-11-14 ENCOUNTER — Other Ambulatory Visit: Payer: Self-pay

## 2019-11-14 VITALS — BP 147/76 | HR 91 | Temp 98.6°F | Resp 18

## 2019-11-14 DIAGNOSIS — J45909 Unspecified asthma, uncomplicated: Secondary | ICD-10-CM | POA: Diagnosis not present

## 2019-11-14 DIAGNOSIS — C50512 Malignant neoplasm of lower-outer quadrant of left female breast: Secondary | ICD-10-CM

## 2019-11-14 DIAGNOSIS — Z791 Long term (current) use of non-steroidal anti-inflammatories (NSAID): Secondary | ICD-10-CM | POA: Diagnosis not present

## 2019-11-14 DIAGNOSIS — Z806 Family history of leukemia: Secondary | ICD-10-CM | POA: Diagnosis not present

## 2019-11-14 DIAGNOSIS — Z1589 Genetic susceptibility to other disease: Secondary | ICD-10-CM | POA: Diagnosis not present

## 2019-11-14 DIAGNOSIS — Z90710 Acquired absence of both cervix and uterus: Secondary | ICD-10-CM | POA: Diagnosis not present

## 2019-11-14 DIAGNOSIS — Z8051 Family history of malignant neoplasm of kidney: Secondary | ICD-10-CM | POA: Diagnosis not present

## 2019-11-14 DIAGNOSIS — D701 Agranulocytosis secondary to cancer chemotherapy: Secondary | ICD-10-CM | POA: Diagnosis not present

## 2019-11-14 DIAGNOSIS — D696 Thrombocytopenia, unspecified: Secondary | ICD-10-CM | POA: Diagnosis not present

## 2019-11-14 DIAGNOSIS — Z171 Estrogen receptor negative status [ER-]: Secondary | ICD-10-CM | POA: Diagnosis not present

## 2019-11-14 DIAGNOSIS — Z95828 Presence of other vascular implants and grafts: Secondary | ICD-10-CM

## 2019-11-14 DIAGNOSIS — Z5111 Encounter for antineoplastic chemotherapy: Secondary | ICD-10-CM | POA: Diagnosis not present

## 2019-11-14 DIAGNOSIS — Z800 Family history of malignant neoplasm of digestive organs: Secondary | ICD-10-CM | POA: Diagnosis not present

## 2019-11-14 DIAGNOSIS — Z7982 Long term (current) use of aspirin: Secondary | ICD-10-CM | POA: Diagnosis not present

## 2019-11-14 DIAGNOSIS — Z79899 Other long term (current) drug therapy: Secondary | ICD-10-CM | POA: Diagnosis not present

## 2019-11-14 DIAGNOSIS — Z803 Family history of malignant neoplasm of breast: Secondary | ICD-10-CM | POA: Diagnosis not present

## 2019-11-14 DIAGNOSIS — C773 Secondary and unspecified malignant neoplasm of axilla and upper limb lymph nodes: Secondary | ICD-10-CM | POA: Diagnosis not present

## 2019-11-14 MED ORDER — FILGRASTIM-SNDZ 480 MCG/0.8ML IJ SOSY
PREFILLED_SYRINGE | INTRAMUSCULAR | Status: AC
  Filled 2019-11-14: qty 0.8

## 2019-11-14 MED ORDER — FILGRASTIM-SNDZ 300 MCG/0.5ML IJ SOSY
PREFILLED_SYRINGE | INTRAMUSCULAR | Status: AC
  Filled 2019-11-14: qty 0.5

## 2019-11-14 MED ORDER — FILGRASTIM-SNDZ 480 MCG/0.8ML IJ SOSY
480.0000 ug | PREFILLED_SYRINGE | Freq: Once | INTRAMUSCULAR | Status: AC
  Administered 2019-11-14: 480 ug via SUBCUTANEOUS

## 2019-11-14 NOTE — Patient Instructions (Signed)

## 2019-11-15 ENCOUNTER — Other Ambulatory Visit: Payer: Self-pay

## 2019-11-15 ENCOUNTER — Inpatient Hospital Stay: Payer: Medicare Other

## 2019-11-15 ENCOUNTER — Ambulatory Visit (HOSPITAL_COMMUNITY)
Admission: RE | Admit: 2019-11-15 | Discharge: 2019-11-15 | Disposition: A | Discharge status: Home / Self Care | Payer: Medicare Other | Source: Ambulatory Visit | Attending: Oncology | Admitting: Oncology

## 2019-11-15 DIAGNOSIS — Z171 Estrogen receptor negative status [ER-]: Secondary | ICD-10-CM

## 2019-11-15 DIAGNOSIS — Z5111 Encounter for antineoplastic chemotherapy: Secondary | ICD-10-CM | POA: Diagnosis not present

## 2019-11-15 DIAGNOSIS — Z79899 Other long term (current) drug therapy: Secondary | ICD-10-CM | POA: Diagnosis not present

## 2019-11-15 DIAGNOSIS — Z90710 Acquired absence of both cervix and uterus: Secondary | ICD-10-CM | POA: Diagnosis not present

## 2019-11-15 DIAGNOSIS — D696 Thrombocytopenia, unspecified: Secondary | ICD-10-CM | POA: Diagnosis not present

## 2019-11-15 DIAGNOSIS — C50512 Malignant neoplasm of lower-outer quadrant of left female breast: Secondary | ICD-10-CM | POA: Diagnosis not present

## 2019-11-15 DIAGNOSIS — Z806 Family history of leukemia: Secondary | ICD-10-CM | POA: Diagnosis not present

## 2019-11-15 DIAGNOSIS — Z8051 Family history of malignant neoplasm of kidney: Secondary | ICD-10-CM | POA: Diagnosis not present

## 2019-11-15 DIAGNOSIS — D701 Agranulocytosis secondary to cancer chemotherapy: Secondary | ICD-10-CM | POA: Diagnosis not present

## 2019-11-15 DIAGNOSIS — Z791 Long term (current) use of non-steroidal anti-inflammatories (NSAID): Secondary | ICD-10-CM | POA: Diagnosis not present

## 2019-11-15 DIAGNOSIS — Z95828 Presence of other vascular implants and grafts: Secondary | ICD-10-CM

## 2019-11-15 DIAGNOSIS — Z7982 Long term (current) use of aspirin: Secondary | ICD-10-CM | POA: Diagnosis not present

## 2019-11-15 DIAGNOSIS — Z1589 Genetic susceptibility to other disease: Secondary | ICD-10-CM | POA: Diagnosis not present

## 2019-11-15 DIAGNOSIS — J45909 Unspecified asthma, uncomplicated: Secondary | ICD-10-CM | POA: Diagnosis not present

## 2019-11-15 DIAGNOSIS — Z803 Family history of malignant neoplasm of breast: Secondary | ICD-10-CM | POA: Diagnosis not present

## 2019-11-15 DIAGNOSIS — C773 Secondary and unspecified malignant neoplasm of axilla and upper limb lymph nodes: Secondary | ICD-10-CM | POA: Diagnosis not present

## 2019-11-15 DIAGNOSIS — Z800 Family history of malignant neoplasm of digestive organs: Secondary | ICD-10-CM | POA: Diagnosis not present

## 2019-11-15 DIAGNOSIS — C50912 Malignant neoplasm of unspecified site of left female breast: Secondary | ICD-10-CM | POA: Diagnosis not present

## 2019-11-15 MED ORDER — GADOBUTROL 1 MMOL/ML IV SOLN
10.0000 mL | Freq: Once | INTRAVENOUS | Status: AC | PRN
  Administered 2019-11-15: 10 mL via INTRAVENOUS

## 2019-11-15 MED ORDER — FILGRASTIM-SNDZ 480 MCG/0.8ML IJ SOSY
480.0000 ug | PREFILLED_SYRINGE | Freq: Once | INTRAMUSCULAR | Status: AC
  Administered 2019-11-15: 480 ug via SUBCUTANEOUS

## 2019-11-15 MED ORDER — FILGRASTIM-SNDZ 480 MCG/0.8ML IJ SOSY
PREFILLED_SYRINGE | INTRAMUSCULAR | Status: AC
  Filled 2019-11-15: qty 0.8

## 2019-11-15 NOTE — Patient Instructions (Signed)

## 2019-11-16 ENCOUNTER — Inpatient Hospital Stay: Payer: Medicare Other

## 2019-11-16 VITALS — BP 140/90 | HR 98 | Temp 99.1°F | Resp 18

## 2019-11-16 DIAGNOSIS — D696 Thrombocytopenia, unspecified: Secondary | ICD-10-CM | POA: Diagnosis not present

## 2019-11-16 DIAGNOSIS — Z90710 Acquired absence of both cervix and uterus: Secondary | ICD-10-CM | POA: Diagnosis not present

## 2019-11-16 DIAGNOSIS — Z7982 Long term (current) use of aspirin: Secondary | ICD-10-CM | POA: Diagnosis not present

## 2019-11-16 DIAGNOSIS — J45909 Unspecified asthma, uncomplicated: Secondary | ICD-10-CM | POA: Diagnosis not present

## 2019-11-16 DIAGNOSIS — Z791 Long term (current) use of non-steroidal anti-inflammatories (NSAID): Secondary | ICD-10-CM | POA: Diagnosis not present

## 2019-11-16 DIAGNOSIS — Z1589 Genetic susceptibility to other disease: Secondary | ICD-10-CM | POA: Diagnosis not present

## 2019-11-16 DIAGNOSIS — C50512 Malignant neoplasm of lower-outer quadrant of left female breast: Secondary | ICD-10-CM | POA: Diagnosis not present

## 2019-11-16 DIAGNOSIS — Z79899 Other long term (current) drug therapy: Secondary | ICD-10-CM | POA: Diagnosis not present

## 2019-11-16 DIAGNOSIS — Z171 Estrogen receptor negative status [ER-]: Secondary | ICD-10-CM | POA: Diagnosis not present

## 2019-11-16 DIAGNOSIS — Z5111 Encounter for antineoplastic chemotherapy: Secondary | ICD-10-CM | POA: Diagnosis not present

## 2019-11-16 DIAGNOSIS — Z800 Family history of malignant neoplasm of digestive organs: Secondary | ICD-10-CM | POA: Diagnosis not present

## 2019-11-16 DIAGNOSIS — D701 Agranulocytosis secondary to cancer chemotherapy: Secondary | ICD-10-CM | POA: Diagnosis not present

## 2019-11-16 DIAGNOSIS — Z803 Family history of malignant neoplasm of breast: Secondary | ICD-10-CM | POA: Diagnosis not present

## 2019-11-16 DIAGNOSIS — Z95828 Presence of other vascular implants and grafts: Secondary | ICD-10-CM

## 2019-11-16 DIAGNOSIS — Z806 Family history of leukemia: Secondary | ICD-10-CM | POA: Diagnosis not present

## 2019-11-16 DIAGNOSIS — Z8051 Family history of malignant neoplasm of kidney: Secondary | ICD-10-CM | POA: Diagnosis not present

## 2019-11-16 DIAGNOSIS — C773 Secondary and unspecified malignant neoplasm of axilla and upper limb lymph nodes: Secondary | ICD-10-CM | POA: Diagnosis not present

## 2019-11-16 MED ORDER — FILGRASTIM-SNDZ 480 MCG/0.8ML IJ SOSY
PREFILLED_SYRINGE | INTRAMUSCULAR | Status: AC
  Filled 2019-11-16: qty 0.8

## 2019-11-16 MED ORDER — FILGRASTIM-SNDZ 480 MCG/0.8ML IJ SOSY
480.0000 ug | PREFILLED_SYRINGE | Freq: Once | INTRAMUSCULAR | Status: AC
  Administered 2019-11-16: 480 ug via SUBCUTANEOUS

## 2019-11-16 NOTE — Patient Instructions (Signed)

## 2019-11-18 NOTE — Progress Notes (Signed)
[place]  [phone] [phone]     ID: Julie Thompson DOB: 03/21/1946  MR#: [id number]  HCW#:[id number]  Patient Care Team: Leeroy Cha, MD as PCP - General (Internal Medicine) Rockwell Germany, RN as Oncology Nurse Navigator Mauro Kaufmann, RN as Oncology Nurse Navigator Eppie Gibson, MD as Attending Physician (Radiation Oncology) Donnie Mesa, MD as Consulting Physician (General Surgery) Magrinat, Virgie Dad, MD as Consulting Physician (Oncology) Scot Dock, NP OTHER MD:  CHIEF COMPLAINT: triple negative breast cancer  CURRENT TREATMENT: Neoadjuvant chemotherapy   INTERVAL HISTORY: Julie Thompson returns today for follow up and treatment of her triple negative breast cancer.   She completed the earlier part of her chemotherapy with cyclophosphamide and doxorubicin and started on weekly Paclitaxel and carboplatin .  Today she will receive her seventh of 12 weekly doses of Paclitaxel and Carboplatin.  She is tolerating this well.  She denies any peripheral neuropathy.   She received three doses of Granix after week 6 of treatment due to neutropenia causing a delay in cycle 6.  She tolerated this well and her ANC is 3.3 today.  Since her last visit, she underwent MRI of the breast on 11/18/2019 that showed resolution in her left breast posterior lesion, and enhancement in the anterior portion with ? Skin invovlement.  Julie Thompson says she can no longer feel the mass.    She is due for PET scan on 11/21/2019.    REVIEW OF SYSTEMS: Julie Thompson is feeling well today.  She has been more active this past week.  On this past Sunday she rested, however the rest of the week she was being active and getting around.  She has a very mild shortness of breath that happens when she is increasingly active.  She has no new issues, particularly peripheral neuroapthy.  She is without fever, chills, chest pain, palpitations, cough, shortness of breath,  bowel/bladder changes, headaches, vision issues, or any other concerns.       HISTORY OF CURRENT ILLNESS: From the original intake note:  Julie Thompson had routine screening mammography on 06/21/2019 showing a possible abnormality in the left breast. She underwent left diagnostic mammography with tomography and left breast ultrasonography at [facility] on 07/01/2019 showing: breast density category C; either 2 adjacent masses or 1 complex dumbbell-shaped mass containing calcifications in the left breast at 6 o'clock, the largest of which measures 2.1 cm; chest wall invasion not excluded; 1-2 mm group of calcifications just anterior to the mass(es); single abnormal lymph node in the left axilla; vascular calcifications in anterior left breast.  Accordingly on 07/09/2019 she proceeded to biopsy of the left breast areas in question. The pathology from this procedure ([id number]) showed:  1. Left Breast, 5:30, posterior  - invasive ductal carcinoma with necrosis, grade 3  - Prognostic indicators significant for: estrogen receptor, 0% negative and progesterone receptor, 0% negative. Proliferation marker Ki67 at 60%. HER2 negative by immunohistochemistry (0). 2. Left Breast, 5:30, posterior  - invasive ductal carcinoma with necrosis, grade 3  - ductal carcinoma in situ, intermediate grade 3. Left Axilla, lymph node (1)  - metastatic carcinoma involving a lymph node  And additional biopsy of the anterior vascular calcifications in the left breast was performed on 07/10/2019. Pathology [id number]) showed: fibrocystic changes with calcifications; no malignancy identified.  This was felt to be concordant.  The patient's subsequent history is as detailed below.   PAST MEDICAL HISTORY: Past Medical History:  Diagnosis Date  . Asthma  no problems now  . Family history of basal cell carcinoma   . Family history of breast cancer   . Family history of colon cancer   . Family history of kidney  cancer   . Family history of leukemia   . Family history of peritoneal cancer   . GERD (gastroesophageal reflux disease)   . H/O hiatal hernia   . Hyperlipidemia   . Hypothyroidism   . IBS (irritable bowel syndrome)   . lt breast ca dx'd 07/10/19    PAST SURGICAL HISTORY: Past Surgical History:  Procedure Laterality Date  . ABDOMINAL HYSTERECTOMY     age 89  . BREAST SURGERY    . DILATION AND CURETTAGE OF UTERUS     2 miscarriages age 23 & 3  . FLEXIBLE SIGMOIDOSCOPY N/A 10/22/2013   Procedure: FLEXIBLE SIGMOIDOSCOPY;  Surgeon: Garlan Fair, MD;  Location: [facility];  Service: Endoscopy;  Laterality: N/A;  . KNEE ARTHROSCOPY Left   . PORTACATH PLACEMENT Right 07/29/2019   Procedure: INSERTION PORT-A-CATH WITH ULTRASOUND;  Surgeon: Donnie Mesa, MD;  Location: [facility];  Service: General;  Laterality: Right;  . TONSILLECTOMY     age 89    FAMILY HISTORY: Family History  Problem Relation Age of Onset  . Breast cancer Maternal Aunt        dx. in her 51s  . Cancer Mother 49       peritoneal cancer, death certificate lists ovarian cancer  . Colon cancer Paternal Uncle        dx. late 32s or early 29s  . Basal cell carcinoma Sister 34       a second diagnosed in her 12s  . Kidney cancer Maternal Grandmother 65  . Diabetes Paternal Grandmother   . Heart Problems Paternal Grandmother   . Leukemia Paternal Uncle        dx. early 42s   Patient's father is currently living at age 37 as of 25-Jul-2019. Patient's mother died from ovarian cancer at age 86. She was diagnosed at age 5. The patient reports breast cancer in a maternal aunt, diagnosed in her 78's. The patient also noted colon cancer in a paternal uncle and kidney cancer in her maternal grandmother.  She has 1 sister.   GYNECOLOGIC HISTORY:  No LMP recorded. Patient has had a hysterectomy. Menarche: [age] years old Age at first live birth: 74 years old [place] P 2 LMP age 38 (with  hysterectomy) Contraceptive: never used HRT used for 10-12 years  Hysterectomy? Yes, age 65 BSO? 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  Husband [name] is a retired Psychologist, prison and probation services. She lives at home with husband Butch. She has two children from a prior marriage. Son [name], age 34, works as a Games developer in [place], [place]. Son [name], age 22, works as a Programme researcher, broadcasting/film/video in [place], [place]. The patient has 4 grandchildren and 4 great-grandchildren. She attends [employer].    ADVANCED DIRECTIVES: In the absence of any documentation to the contrary, the patient's spouse is their HCPOA.    HEALTH MAINTENANCE: Social History   Tobacco Use  . Smoking status: Never Smoker  . Smokeless tobacco: Never Used  Substance Use Topics  . Alcohol use: No  . Drug use: No     Colonoscopy: 09/2013, with CT virtual 11/2013  PAP: none on file, s/p hysterectomy  Bone density: 04/2001, -0.5   Allergies  Allergen Reactions  . Sulfa Antibiotics Rash  . Penicillins Hives    Current Outpatient Medications  Medication Sig Dispense Refill  . aspirin EC 81 MG tablet Take 81 mg by mouth daily.    Marland Kitchen atorvastatin (LIPITOR) 20 MG tablet Take 20 mg by mouth daily.    . Cholecalciferol (VITAMIN D) 2000 UNITS tablet Take 2,000 Units by mouth daily.    Marland Kitchen co-enzyme Q-10 30 MG capsule Take 20 mg by mouth 3 (three) times daily.    Marland Kitchen esomeprazole (NEXIUM) 20 MG capsule Take 20 mg by mouth daily at 12 noon.    . famotidine (PEPCID) 20 MG tablet Take 40 mg by mouth 2 (two) times daily.    Marland Kitchen FIBER PO Take 1 tablet by mouth daily.    . fluconazole (DIFLUCAN) 200 MG tablet Take 1 tablet (200 mg total) by mouth daily. 30 tablet 0  . HYDROcodone-acetaminophen (NORCO/VICODIN) 5-325 MG tablet Take 1 tablet by mouth every 6 (six) hours as needed for moderate pain. 15 tablet 0  . levothyroxine (SYNTHROID) 50 MCG tablet Take 50  mcg by mouth daily.    Marland Kitchen lidocaine-prilocaine (EMLA) cream Apply 1 application topically as needed. 30 g 0  . loratadine (CLARITIN) 10 MG tablet Take 1 tablet (10 mg total) by mouth daily. 60 tablet 1  . LORazepam (ATIVAN) 0.5 MG tablet TAKE 1 TABLET(0.5 MG) BY MOUTH AT BEDTIME AS NEEDED FOR NAUSEA OR VOMITING 30 tablet 0  . Multiple Vitamin (MULTIVITAMIN WITH MINERALS) TABS tablet Take 1 tablet by mouth daily.    . nabumetone (RELAFEN) 500 MG tablet Take 500 mg by mouth 2 (two) times daily as needed.    . Omega-3 Fatty Acids (FISH OIL) 1200 MG CAPS Take 1,200 mg by mouth daily.    . prochlorperazine (COMPAZINE) 10 MG tablet Take 1 tablet (10 mg total) by mouth every 6 (six) hours as needed (Nausea or vomiting). 30 tablet 1  . valACYclovir (VALTREX) 500 MG tablet Take 1 tablet (500 mg total) by mouth 2 (two) times daily. 180 tablet 0   No current facility-administered medications for this visit.    OBJECTIVE:   Vitals:   11/19/19 0926  BP: (!) 154/57  Pulse: 89  Resp: 17  Temp: 98.5 F (36.9 C)  SpO2: 99%     Body mass index is 36.7 kg/m.   Wt Readings from Last 3 Encounters:  11/19/19 187 lb 14.4 oz (85.2 kg)  11/12/19 190 lb 11.2 oz (86.5 kg)  11/05/19 187 lb 11.2 oz (85.1 kg)  ECOG FS:1 - Symptomatic but completely ambulatory GENERAL: Patient is a well appearing female in no acute distress HEENT:  Sclerae anicteric.  Mask in place. Neck is supple.  NODES:  No cervical, supraclavicular, or axillary lymphadenopathy palpated.  BREAST EXAM:  Left breast with ? Very small 0.5cm palpable mass, no skin abnormality noted overlying anterior breast LUNGS:  Clear to auscultation bilaterally.  No wheezes or rhonchi. HEART:  Regular rate and rhythm. No murmur appreciated. ABDOMEN:  Soft, nontender.  Positive, normoactive bowel sounds. No organomegaly palpated. MSK:  No focal spinal tenderness to palpation.  EXTREMITIES:  No peripheral edema.   SKIN:  Clear with no obvious rashes or skin  changes. Darkening at nail beds. NEURO:  Nonfocal. Well oriented.  Appropriate affect.   LAB RESULTS:  CMP     Component Value Date/Time   NA 147 (H) 11/19/2019 0905   K 4.0 11/19/2019 0905   CL 110 11/19/2019 0905  CO2 25 11/19/2019 0905   GLUCOSE 123 (H) 11/19/2019 0905   BUN 9 11/19/2019 0905   CREATININE 0.75 11/19/2019 0905   CREATININE 0.94 07/17/2019 0823   CALCIUM 9.1 11/19/2019 0905   PROT 6.2 (L) 11/19/2019 0905   ALBUMIN 3.4 (L) 11/19/2019 0905   AST 27 11/19/2019 0905   AST 21 07/17/2019 0823   ALT 16 11/19/2019 0905   ALT 18 07/17/2019 0823   ALKPHOS 114 11/19/2019 0905   BILITOT 0.2 (L) 11/19/2019 0905   BILITOT 0.4 07/17/2019 0823   GFRNONAA >60 11/19/2019 0905   GFRNONAA >60 07/17/2019 0823   GFRAA >60 11/19/2019 0905   GFRAA >60 07/17/2019 0823    No results found for: TOTALPROTELP, ALBUMINELP, A1GS, A2GS, BETS, BETA2SER, GAMS, MSPIKE, SPEI  Lab Results  Component Value Date   WBC 5.6 11/19/2019   NEUTROABS 3.3 11/19/2019   HGB 9.6 (L) 11/19/2019   HCT 29.8 (L) 11/19/2019   MCV 105.3 (H) 11/19/2019   PLT 197 11/19/2019    No results found for: LABCA2  No components found for: QPYPPJ093  No results for input(s): INR in the last 168 hours.  No results found for: LABCA2  No results found for: OIZ124  No results found for: PYK998  No results found for: PJA250  No results found for: CA2729  No components found for: HGQUANT  No results found for: CEA1 / No results found for: CEA1   No results found for: AFPTUMOR  No results found for: CHROMOGRNA  No results found for: KPAFRELGTCHN, LAMBDASER, KAPLAMBRATIO (kappa/lambda light chains)  No results found for: HGBA, HGBA2QUANT, HGBFQUANT, HGBSQUAN (Hemoglobinopathy evaluation)   No results found for: LDH  No results found for: IRON, TIBC, IRONPCTSAT (Iron and TIBC)  No results found for: FERRITIN  Urinalysis No results found for: COLORURINE, APPEARANCEUR, LABSPEC, PHURINE,  GLUCOSEU, HGBUR, BILIRUBINUR, KETONESUR, PROTEINUR, UROBILINOGEN, NITRITE, LEUKOCYTESUR   STUDIES: MR BREAST BILATERAL W WO CONTRAST INC CAD  Result Date: 11/18/2019 CLINICAL DATA:  Follow-up known left breast cancer. The patient has undergone neoadjuvant therapy. LABS:  None EXAM: BILATERAL BREAST MRI WITH AND WITHOUT CONTRAST TECHNIQUE: Multiplanar, multisequence MR images of both breasts were obtained prior to and following the intravenous administration of 10 ml of Gadavist Three-dimensional MR images were rendered by post-processing of the original MR data on an independent workstation. The three-dimensional MR images were interpreted, and findings are reported in the following complete MRI report for this study. Three dimensional images were evaluated at the independent DynaCad workstation COMPARISON:  July 25, 2019 FINDINGS: Breast composition: c. Heterogeneous fibroglandular tissue. Background parenchymal enhancement: Mild Right breast: No mass or abnormal enhancement. Left breast: There has been significant interval improvement. The patient's malignancy was dumbbell-shaped with both an anterior and posterior portion. There is no enhancement in the region of the posterior portion of the known malignancy. There is peripheral enhancement around the anterior portion of the known malignancy measuring 1.5 by 1.2 by 1.8 cm in the AP, transverse, and craniocaudal dimensions. The remaining enhancement abuts the skin inferiorly with possible dimpling seen on coronal images. Lymph nodes: No abnormal appearing lymph nodes. Ancillary findings:  None. IMPRESSION: 1. No abnormalities identified in the right breast. 2. The patient's biopsied malignancy in the left breast was dumbbell shaped with anterior and posterior portions. There is no abnormal enhancement associated with the posterior portion. There is peripheral ring-like enhancement around the anterior portion measuring 1.5 x 1.2 x 1.8 cm, extending to the  skin overlying the inferior aspect  of the mass with slight skin dimpling suggested on coronal imaging. Skin involvement cannot be definitively determined on these images. 3. No adenopathy. RECOMMENDATION: Recommend continued surgical and oncologic follow up. BI-RADS CATEGORY  6: Known biopsy-proven malignancy. Electronically Signed   By: Dorise Bullion III M.D   On: 11/18/2019 16:06     ELIGIBLE FOR AVAILABLE RESEARCH PROTOCOL: no  ASSESSMENT: 74 y.o. [place] woman status post left breast lower outer quadrant biopsy 07/09/2019 for a clinical T2 N1, stage IIIB invasive ductal carcinoma, grade 3, triple negative, with an MIB-1 of 60%  (a) chest CT scan and bone scan 07/31/2019 showed no evidence of metastatic disease  (b) MRI biopsy of 2 additional areas in the left breast (central, 07/10/2019, and at 8:30 o'clock on 07/26/2019) are both benign and concordant  (c) biopsy of 2 suspicious areas in the right breast on 07/26/2019 (9:00 and lower outer quadrant) were benign and concordant   (1) neoadjuvant chemotherapy consisting of cyclophosphamide and doxorubicin in dose dense fashion x4 started 07/30/2019, completed 09/10/2019, followed by weekly carboplatin and paclitaxel x12 started 09/24/2019  (a) cycle 6 delayed 2 weeks because of anemia and thrombocytopenia, neutropenia  (2) definitive surgery to follow with targeted axillary lymph node dissection  (3) adjuvant radiation to follow  (4) genetics testing 07/23/2019 through the STAT Breast cancer panel offered by [facility] found no deleterious mutations in ATM, BRCA1, BRCA2, CDH1, CHEK2, PALB2, PTEN, STK11 and TP53.  Additional testing through the Common Hereditary Cancers Panel offered by [facility] also found no deleterious mutations in IPC, ATM, AXIN2, BARD1, BMPR1A, BRCA1, BRCA2, BRIP1, CDH1, CDK4, CDKN2A (p14ARF), CDKN2A (p16INK4a), CHEK2, CTNNA1, DICER1, EPCAM (Deletion/duplication testing only), GREM1 (promoter region deletion/duplication  testing only), KIT, MEN1, MLH1, MSH2, MSH3, MSH6, MUTYH, NBN, NF1, NHTL1, PALB2, PDGFRA, PMS2, POLD1, POLE, PTEN, RAD50, RAD51C, RAD51D, RNF43, SDHB, SDHC, SDHD, SMAD4, SMARCA4. STK11, TP53, TSC1, TSC2, and VHL.  The following genes were evaluated for sequence changes only: SDHA and HOXB13 c.251G>A variant only.   (a) 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    PLAN: Sanae is doing well today.  She continues on treatment with Paclitaxel and Carboplatin given every week neoadjuvantly.  She has no peripheral neuropathy, and her labs are normal today for treatment.    Peter Congo and I reviewed her labs in detail.  Her WBC are improved.  She will receive Granix after cycle 7 as well to keep her on track with her treatments.    I reviewed her MRI with her in detail and we looked at the pictures on the screen.  I ? Feel a soft residual 0.5cm area in her left lower breast, but I don't note any skin changes on inspection.    I let Aarion know that her shortness of breath is related to deconditioning from her treatment.    Peter Congo and I discussed her upcoming PET scan on 11/21/2019.  She will undergo this.  She sees Dr. Jana Hakim on 11/26/2019 to review all of the information with her, prior to her eighth treatment.  She understands this.    She will return in one week for labs, f/u and her next chemotherapy.  She was recommended to continue with the appropriate pandemic precautions. She knows to call for any questions that may arise between now and her next appointment.  We are happy to see her sooner if needed.  Total encounter time 30 minutes.Wilber Bihari, NP 11/19/19 10:04 AM Medical Oncology and Hematology  [facility] [place], [place] [address] Tel. [phone]    Fax. [phone]   *Total Encounter Time as defined by the Centers for Medicare and Medicaid Services includes, in addition to the face-to-face time of a patient  visit (documented in the note above) non-face-to-face time: obtaining and reviewing outside history, ordering and reviewing medications, tests or procedures, care coordination (communications with other health care professionals or caregivers) and documentation in the medical record.

## 2019-11-19 ENCOUNTER — Inpatient Hospital Stay: Payer: Medicare Other

## 2019-11-19 ENCOUNTER — Inpatient Hospital Stay: Payer: Medicare Other | Admitting: Adult Health

## 2019-11-19 ENCOUNTER — Encounter: Payer: Self-pay | Admitting: *Deleted

## 2019-11-19 ENCOUNTER — Other Ambulatory Visit: Payer: Self-pay

## 2019-11-19 ENCOUNTER — Encounter: Payer: Self-pay | Admitting: Adult Health

## 2019-11-19 VITALS — BP 154/57 | HR 89 | Temp 98.5°F | Resp 17 | Ht 60.0 in | Wt 187.9 lb

## 2019-11-19 DIAGNOSIS — C50512 Malignant neoplasm of lower-outer quadrant of left female breast: Secondary | ICD-10-CM

## 2019-11-19 DIAGNOSIS — Z7982 Long term (current) use of aspirin: Secondary | ICD-10-CM | POA: Diagnosis not present

## 2019-11-19 DIAGNOSIS — Z800 Family history of malignant neoplasm of digestive organs: Secondary | ICD-10-CM | POA: Diagnosis not present

## 2019-11-19 DIAGNOSIS — Z171 Estrogen receptor negative status [ER-]: Secondary | ICD-10-CM

## 2019-11-19 DIAGNOSIS — Z1589 Genetic susceptibility to other disease: Secondary | ICD-10-CM | POA: Diagnosis not present

## 2019-11-19 DIAGNOSIS — Z791 Long term (current) use of non-steroidal anti-inflammatories (NSAID): Secondary | ICD-10-CM | POA: Diagnosis not present

## 2019-11-19 DIAGNOSIS — Z90710 Acquired absence of both cervix and uterus: Secondary | ICD-10-CM | POA: Diagnosis not present

## 2019-11-19 DIAGNOSIS — D696 Thrombocytopenia, unspecified: Secondary | ICD-10-CM | POA: Diagnosis not present

## 2019-11-19 DIAGNOSIS — Z806 Family history of leukemia: Secondary | ICD-10-CM | POA: Diagnosis not present

## 2019-11-19 DIAGNOSIS — Z803 Family history of malignant neoplasm of breast: Secondary | ICD-10-CM | POA: Diagnosis not present

## 2019-11-19 DIAGNOSIS — C773 Secondary and unspecified malignant neoplasm of axilla and upper limb lymph nodes: Secondary | ICD-10-CM | POA: Diagnosis not present

## 2019-11-19 DIAGNOSIS — J45909 Unspecified asthma, uncomplicated: Secondary | ICD-10-CM | POA: Diagnosis not present

## 2019-11-19 DIAGNOSIS — Z8051 Family history of malignant neoplasm of kidney: Secondary | ICD-10-CM | POA: Diagnosis not present

## 2019-11-19 DIAGNOSIS — D701 Agranulocytosis secondary to cancer chemotherapy: Secondary | ICD-10-CM | POA: Diagnosis not present

## 2019-11-19 DIAGNOSIS — Z79899 Other long term (current) drug therapy: Secondary | ICD-10-CM | POA: Diagnosis not present

## 2019-11-19 DIAGNOSIS — Z5111 Encounter for antineoplastic chemotherapy: Secondary | ICD-10-CM | POA: Diagnosis not present

## 2019-11-19 LAB — CBC WITH DIFFERENTIAL/PLATELET
Abs Immature Granulocytes: 0.74 10*3/uL — ABNORMAL HIGH (ref 0.00–0.07)
Basophils Absolute: 0 10*3/uL (ref 0.0–0.1)
Basophils Relative: 0 %
Eosinophils Absolute: 0 10*3/uL (ref 0.0–0.5)
Eosinophils Relative: 1 %
HCT: 29.8 % — ABNORMAL LOW (ref 36.0–46.0)
Hemoglobin: 9.6 g/dL — ABNORMAL LOW (ref 12.0–15.0)
Immature Granulocytes: 13 %
Lymphocytes Relative: 10 %
Lymphs Abs: 0.6 10*3/uL — ABNORMAL LOW (ref 0.7–4.0)
MCH: 33.9 pg (ref 26.0–34.0)
MCHC: 32.2 g/dL (ref 30.0–36.0)
MCV: 105.3 fL — ABNORMAL HIGH (ref 80.0–100.0)
Monocytes Absolute: 0.9 10*3/uL (ref 0.1–1.0)
Monocytes Relative: 17 %
Neutro Abs: 3.3 10*3/uL (ref 1.7–7.7)
Neutrophils Relative %: 59 %
Platelets: 197 10*3/uL (ref 150–400)
RBC: 2.83 MIL/uL — ABNORMAL LOW (ref 3.87–5.11)
RDW: 18.5 % — ABNORMAL HIGH (ref 11.5–15.5)
WBC: 5.6 10*3/uL (ref 4.0–10.5)
nRBC: 2.5 % — ABNORMAL HIGH (ref 0.0–0.2)

## 2019-11-19 LAB — COMPREHENSIVE METABOLIC PANEL
ALT: 16 U/L (ref 0–44)
AST: 27 U/L (ref 15–41)
Albumin: 3.4 g/dL — ABNORMAL LOW (ref 3.5–5.0)
Alkaline Phosphatase: 114 U/L (ref 38–126)
Anion gap: 12 (ref 5–15)
BUN: 9 mg/dL (ref 8–23)
CO2: 25 mmol/L (ref 22–32)
Calcium: 9.1 mg/dL (ref 8.9–10.3)
Chloride: 110 mmol/L (ref 98–111)
Creatinine, Ser: 0.75 mg/dL (ref 0.44–1.00)
GFR calc Af Amer: >60 mL/min (ref >60)
GFR calc non Af Amer: >60 mL/min (ref >60)
Glucose, Bld: 123 mg/dL — ABNORMAL HIGH (ref 70–99)
Potassium: 4 mmol/L (ref 3.5–5.1)
Sodium: 147 mmol/L — ABNORMAL HIGH (ref 135–145)
Total Bilirubin: 0.2 mg/dL — ABNORMAL LOW (ref 0.3–1.2)
Total Protein: 6.2 g/dL — ABNORMAL LOW (ref 6.5–8.1)

## 2019-11-19 MED ORDER — FAMOTIDINE IN NACL 20-0.9 MG/50ML-% IV SOLN
20.0000 mg | Freq: Once | INTRAVENOUS | Status: AC
  Administered 2019-11-19: 20 mg via INTRAVENOUS

## 2019-11-19 MED ORDER — SODIUM CHLORIDE 0.9 % IV SOLN
10.0000 mg | Freq: Once | INTRAVENOUS | Status: AC
  Administered 2019-11-19: 10 mg via INTRAVENOUS
  Filled 2019-11-19: qty 10
  Filled 2019-11-19: qty 1

## 2019-11-19 MED ORDER — HEPARIN SOD (PORK) LOCK FLUSH 100 UNIT/ML IV SOLN
500.0000 [IU] | Freq: Once | INTRAVENOUS | Status: AC | PRN
  Administered 2019-11-19: 500 [IU]
  Filled 2019-11-19: qty 5

## 2019-11-19 MED ORDER — PALONOSETRON HCL INJECTION 0.25 MG/5ML
INTRAVENOUS | Status: AC
  Filled 2019-11-19: qty 5

## 2019-11-19 MED ORDER — PALONOSETRON HCL INJECTION 0.25 MG/5ML
0.2500 mg | Freq: Once | INTRAVENOUS | Status: AC
  Administered 2019-11-19: 0.25 mg via INTRAVENOUS

## 2019-11-19 MED ORDER — SODIUM CHLORIDE 0.9% FLUSH
10.0000 mL | INTRAVENOUS | Status: DC | PRN
  Administered 2019-11-19: 10 mL
  Filled 2019-11-19: qty 10

## 2019-11-19 MED ORDER — FAMOTIDINE IN NACL 20-0.9 MG/50ML-% IV SOLN
INTRAVENOUS | Status: AC
  Filled 2019-11-19: qty 50

## 2019-11-19 MED ORDER — SODIUM CHLORIDE 0.9 % IV SOLN
182.8000 mg | Freq: Once | INTRAVENOUS | Status: AC
  Administered 2019-11-19: 180 mg via INTRAVENOUS
  Filled 2019-11-19: qty 18

## 2019-11-19 MED ORDER — SODIUM CHLORIDE 0.9 % IV SOLN
150.0000 mg | Freq: Once | INTRAVENOUS | Status: AC
Start: 2019-11-19 — End: 2019-11-19
  Administered 2019-11-19: 150 mg via INTRAVENOUS
  Filled 2019-11-19: qty 150
  Filled 2019-11-19: qty 5

## 2019-11-19 MED ORDER — SODIUM CHLORIDE 0.9 % IV SOLN
Freq: Once | INTRAVENOUS | Status: AC
  Filled 2019-11-19: qty 250

## 2019-11-19 MED ORDER — DIPHENHYDRAMINE HCL 50 MG/ML IJ SOLN
50.0000 mg | Freq: Once | INTRAMUSCULAR | Status: AC
  Administered 2019-11-19: 50 mg via INTRAVENOUS

## 2019-11-19 MED ORDER — DIPHENHYDRAMINE HCL 50 MG/ML IJ SOLN
INTRAMUSCULAR | Status: AC
  Filled 2019-11-19: qty 1

## 2019-11-19 MED ORDER — SODIUM CHLORIDE 0.9 % IV SOLN
80.0000 mg/m2 | Freq: Once | INTRAVENOUS | Status: AC
  Administered 2019-11-19: 150 mg via INTRAVENOUS
  Filled 2019-11-19: qty 25

## 2019-11-19 NOTE — Patient Instructions (Signed)
[facility] °Discharge Instructions for Patients Receiving Chemotherapy ° °Today you received the following chemotherapy agents Taxol; Carboplatin ° °To help prevent nausea and vomiting after your treatment, we encourage you to take your nausea medication as directed °  °If you develop nausea and vomiting that is not controlled by your nausea medication, call the clinic.  ° °BELOW ARE SYMPTOMS THAT SHOULD BE REPORTED IMMEDIATELY: °· *FEVER GREATER THAN 100.5 F °· *CHILLS WITH OR WITHOUT FEVER °· NAUSEA AND VOMITING THAT IS NOT CONTROLLED WITH YOUR NAUSEA MEDICATION °· *UNUSUAL SHORTNESS OF BREATH °· *UNUSUAL BRUISING OR BLEEDING °· TENDERNESS IN MOUTH AND THROAT WITH OR WITHOUT PRESENCE OF ULCERS °· *URINARY PROBLEMS °· *BOWEL PROBLEMS °· UNUSUAL RASH °Items with * indicate a potential emergency and should be followed up as soon as possible. ° °Feel free to call the clinic should you have any questions or concerns. The clinic phone number is [phone]. ° °Please show the CHEMO ALERT CARD at check-in to the Emergency Department and triage nurse. ° ° °

## 2019-11-20 ENCOUNTER — Telehealth: Payer: Self-pay | Admitting: Oncology

## 2019-11-20 NOTE — Telephone Encounter (Signed)
Scheduled appts per 5/18 los. Pt confirmed appt date and time.  

## 2019-11-21 ENCOUNTER — Encounter (HOSPITAL_COMMUNITY)
Admission: RE | Admit: 2019-11-21 | Discharge: 2019-11-21 | Disposition: A | Discharge status: Home / Self Care | Payer: Medicare Other | Source: Ambulatory Visit | Attending: Oncology | Admitting: Oncology

## 2019-11-21 ENCOUNTER — Inpatient Hospital Stay: Payer: Medicare Other

## 2019-11-21 ENCOUNTER — Encounter: Payer: Self-pay | Admitting: *Deleted

## 2019-11-21 ENCOUNTER — Other Ambulatory Visit: Payer: Self-pay

## 2019-11-21 VITALS — BP 139/66 | HR 88 | Temp 98.6°F

## 2019-11-21 DIAGNOSIS — Z1589 Genetic susceptibility to other disease: Secondary | ICD-10-CM | POA: Diagnosis not present

## 2019-11-21 DIAGNOSIS — Z8051 Family history of malignant neoplasm of kidney: Secondary | ICD-10-CM | POA: Diagnosis not present

## 2019-11-21 DIAGNOSIS — I700 Atherosclerosis of aorta: Secondary | ICD-10-CM | POA: Insufficient documentation

## 2019-11-21 DIAGNOSIS — Z171 Estrogen receptor negative status [ER-]: Secondary | ICD-10-CM | POA: Diagnosis not present

## 2019-11-21 DIAGNOSIS — Z79899 Other long term (current) drug therapy: Secondary | ICD-10-CM | POA: Insufficient documentation

## 2019-11-21 DIAGNOSIS — C50919 Malignant neoplasm of unspecified site of unspecified female breast: Secondary | ICD-10-CM | POA: Diagnosis not present

## 2019-11-21 DIAGNOSIS — Z7982 Long term (current) use of aspirin: Secondary | ICD-10-CM | POA: Diagnosis not present

## 2019-11-21 DIAGNOSIS — D701 Agranulocytosis secondary to cancer chemotherapy: Secondary | ICD-10-CM | POA: Diagnosis not present

## 2019-11-21 DIAGNOSIS — Z90710 Acquired absence of both cervix and uterus: Secondary | ICD-10-CM | POA: Diagnosis not present

## 2019-11-21 DIAGNOSIS — D696 Thrombocytopenia, unspecified: Secondary | ICD-10-CM | POA: Diagnosis not present

## 2019-11-21 DIAGNOSIS — Z95828 Presence of other vascular implants and grafts: Secondary | ICD-10-CM

## 2019-11-21 DIAGNOSIS — C50512 Malignant neoplasm of lower-outer quadrant of left female breast: Secondary | ICD-10-CM

## 2019-11-21 DIAGNOSIS — J45909 Unspecified asthma, uncomplicated: Secondary | ICD-10-CM | POA: Diagnosis not present

## 2019-11-21 DIAGNOSIS — C773 Secondary and unspecified malignant neoplasm of axilla and upper limb lymph nodes: Secondary | ICD-10-CM | POA: Diagnosis not present

## 2019-11-21 DIAGNOSIS — Z806 Family history of leukemia: Secondary | ICD-10-CM | POA: Diagnosis not present

## 2019-11-21 DIAGNOSIS — Z791 Long term (current) use of non-steroidal anti-inflammatories (NSAID): Secondary | ICD-10-CM | POA: Diagnosis not present

## 2019-11-21 DIAGNOSIS — Z5111 Encounter for antineoplastic chemotherapy: Secondary | ICD-10-CM | POA: Diagnosis not present

## 2019-11-21 DIAGNOSIS — Z800 Family history of malignant neoplasm of digestive organs: Secondary | ICD-10-CM | POA: Diagnosis not present

## 2019-11-21 DIAGNOSIS — Z803 Family history of malignant neoplasm of breast: Secondary | ICD-10-CM | POA: Diagnosis not present

## 2019-11-21 LAB — GLUCOSE, CAPILLARY: Glucose-Capillary: 84 mg/dL (ref 70–99)

## 2019-11-21 MED ORDER — FILGRASTIM-SNDZ 480 MCG/0.8ML IJ SOSY
480.0000 ug | PREFILLED_SYRINGE | Freq: Once | INTRAMUSCULAR | Status: AC
  Administered 2019-11-21: 480 ug via SUBCUTANEOUS

## 2019-11-21 MED ORDER — FLUDEOXYGLUCOSE F - 18 (FDG) INJECTION
9.3000 | Freq: Once | INTRAVENOUS | Status: AC
  Administered 2019-11-21: 9.3 via INTRAVENOUS

## 2019-11-21 MED ORDER — FILGRASTIM-SNDZ 480 MCG/0.8ML IJ SOSY
PREFILLED_SYRINGE | INTRAMUSCULAR | Status: AC
  Filled 2019-11-21: qty 0.8

## 2019-11-21 MED ORDER — TBO-FILGRASTIM 480 MCG/0.8ML ~~LOC~~ SOSY
480.0000 ug | PREFILLED_SYRINGE | Freq: Once | SUBCUTANEOUS | Status: DC
  Filled 2019-11-21: qty 0.8

## 2019-11-21 NOTE — Patient Instructions (Addendum)

## 2019-11-22 ENCOUNTER — Inpatient Hospital Stay: Payer: Medicare Other

## 2019-11-22 ENCOUNTER — Other Ambulatory Visit: Payer: Self-pay

## 2019-11-22 VITALS — BP 134/72 | HR 99 | Temp 98.2°F | Resp 18

## 2019-11-22 DIAGNOSIS — E039 Hypothyroidism, unspecified: Secondary | ICD-10-CM | POA: Diagnosis not present

## 2019-11-22 DIAGNOSIS — Z7982 Long term (current) use of aspirin: Secondary | ICD-10-CM | POA: Diagnosis not present

## 2019-11-22 DIAGNOSIS — Z806 Family history of leukemia: Secondary | ICD-10-CM | POA: Diagnosis not present

## 2019-11-22 DIAGNOSIS — Z791 Long term (current) use of non-steroidal anti-inflammatories (NSAID): Secondary | ICD-10-CM | POA: Diagnosis not present

## 2019-11-22 DIAGNOSIS — Z171 Estrogen receptor negative status [ER-]: Secondary | ICD-10-CM | POA: Diagnosis not present

## 2019-11-22 DIAGNOSIS — Z800 Family history of malignant neoplasm of digestive organs: Secondary | ICD-10-CM | POA: Diagnosis not present

## 2019-11-22 DIAGNOSIS — C773 Secondary and unspecified malignant neoplasm of axilla and upper limb lymph nodes: Secondary | ICD-10-CM | POA: Diagnosis not present

## 2019-11-22 DIAGNOSIS — C50512 Malignant neoplasm of lower-outer quadrant of left female breast: Secondary | ICD-10-CM | POA: Diagnosis not present

## 2019-11-22 DIAGNOSIS — Z5111 Encounter for antineoplastic chemotherapy: Secondary | ICD-10-CM | POA: Diagnosis not present

## 2019-11-22 DIAGNOSIS — Z8051 Family history of malignant neoplasm of kidney: Secondary | ICD-10-CM | POA: Diagnosis not present

## 2019-11-22 DIAGNOSIS — D701 Agranulocytosis secondary to cancer chemotherapy: Secondary | ICD-10-CM | POA: Diagnosis not present

## 2019-11-22 DIAGNOSIS — Z90710 Acquired absence of both cervix and uterus: Secondary | ICD-10-CM | POA: Diagnosis not present

## 2019-11-22 DIAGNOSIS — D696 Thrombocytopenia, unspecified: Secondary | ICD-10-CM | POA: Diagnosis not present

## 2019-11-22 DIAGNOSIS — Z803 Family history of malignant neoplasm of breast: Secondary | ICD-10-CM | POA: Diagnosis not present

## 2019-11-22 DIAGNOSIS — Z95828 Presence of other vascular implants and grafts: Secondary | ICD-10-CM

## 2019-11-22 DIAGNOSIS — E7800 Pure hypercholesterolemia, unspecified: Secondary | ICD-10-CM | POA: Diagnosis not present

## 2019-11-22 DIAGNOSIS — M170 Bilateral primary osteoarthritis of knee: Secondary | ICD-10-CM | POA: Diagnosis not present

## 2019-11-22 DIAGNOSIS — Z79899 Other long term (current) drug therapy: Secondary | ICD-10-CM | POA: Diagnosis not present

## 2019-11-22 DIAGNOSIS — J45909 Unspecified asthma, uncomplicated: Secondary | ICD-10-CM | POA: Diagnosis not present

## 2019-11-22 DIAGNOSIS — Z1589 Genetic susceptibility to other disease: Secondary | ICD-10-CM | POA: Diagnosis not present

## 2019-11-22 MED ORDER — FILGRASTIM-SNDZ 480 MCG/0.8ML IJ SOSY
PREFILLED_SYRINGE | INTRAMUSCULAR | Status: AC
  Filled 2019-11-22: qty 0.8

## 2019-11-22 MED ORDER — FILGRASTIM-SNDZ 480 MCG/0.8ML IJ SOSY
480.0000 ug | PREFILLED_SYRINGE | Freq: Once | INTRAMUSCULAR | Status: AC
  Administered 2019-11-22: 480 ug via SUBCUTANEOUS

## 2019-11-22 NOTE — Patient Instructions (Signed)

## 2019-11-23 ENCOUNTER — Inpatient Hospital Stay: Payer: Medicare Other

## 2019-11-23 VITALS — BP 118/75 | HR 94 | Temp 98.7°F | Resp 18

## 2019-11-23 DIAGNOSIS — J45909 Unspecified asthma, uncomplicated: Secondary | ICD-10-CM | POA: Diagnosis not present

## 2019-11-23 DIAGNOSIS — Z171 Estrogen receptor negative status [ER-]: Secondary | ICD-10-CM | POA: Diagnosis not present

## 2019-11-23 DIAGNOSIS — Z7982 Long term (current) use of aspirin: Secondary | ICD-10-CM | POA: Diagnosis not present

## 2019-11-23 DIAGNOSIS — Z1589 Genetic susceptibility to other disease: Secondary | ICD-10-CM | POA: Diagnosis not present

## 2019-11-23 DIAGNOSIS — Z806 Family history of leukemia: Secondary | ICD-10-CM | POA: Diagnosis not present

## 2019-11-23 DIAGNOSIS — Z95828 Presence of other vascular implants and grafts: Secondary | ICD-10-CM

## 2019-11-23 DIAGNOSIS — Z79899 Other long term (current) drug therapy: Secondary | ICD-10-CM | POA: Diagnosis not present

## 2019-11-23 DIAGNOSIS — C773 Secondary and unspecified malignant neoplasm of axilla and upper limb lymph nodes: Secondary | ICD-10-CM | POA: Diagnosis not present

## 2019-11-23 DIAGNOSIS — Z8051 Family history of malignant neoplasm of kidney: Secondary | ICD-10-CM | POA: Diagnosis not present

## 2019-11-23 DIAGNOSIS — D701 Agranulocytosis secondary to cancer chemotherapy: Secondary | ICD-10-CM | POA: Diagnosis not present

## 2019-11-23 DIAGNOSIS — Z803 Family history of malignant neoplasm of breast: Secondary | ICD-10-CM | POA: Diagnosis not present

## 2019-11-23 DIAGNOSIS — D696 Thrombocytopenia, unspecified: Secondary | ICD-10-CM | POA: Diagnosis not present

## 2019-11-23 DIAGNOSIS — Z791 Long term (current) use of non-steroidal anti-inflammatories (NSAID): Secondary | ICD-10-CM | POA: Diagnosis not present

## 2019-11-23 DIAGNOSIS — C50512 Malignant neoplasm of lower-outer quadrant of left female breast: Secondary | ICD-10-CM | POA: Diagnosis not present

## 2019-11-23 DIAGNOSIS — Z90710 Acquired absence of both cervix and uterus: Secondary | ICD-10-CM | POA: Diagnosis not present

## 2019-11-23 DIAGNOSIS — Z800 Family history of malignant neoplasm of digestive organs: Secondary | ICD-10-CM | POA: Diagnosis not present

## 2019-11-23 DIAGNOSIS — Z5111 Encounter for antineoplastic chemotherapy: Secondary | ICD-10-CM | POA: Diagnosis not present

## 2019-11-23 MED ORDER — FILGRASTIM-SNDZ 480 MCG/0.8ML IJ SOSY
480.0000 ug | PREFILLED_SYRINGE | Freq: Once | INTRAMUSCULAR | Status: AC
  Administered 2019-11-23: 480 ug via SUBCUTANEOUS

## 2019-11-23 NOTE — Patient Instructions (Signed)

## 2019-11-25 ENCOUNTER — Other Ambulatory Visit: Payer: Self-pay | Admitting: Oncology

## 2019-11-25 NOTE — Progress Notes (Signed)
[place]  Telephone:([phone]) [phone] Fax:(336) [phone]     ID: Julie Thompson DOB: Jul 20, 1945  MR#: [id number]  LDJ#:[id number]  Patient Care Team: Leeroy Cha, MD as PCP - General (Internal Medicine) Rockwell Germany, RN as Oncology Nurse Navigator Mauro Kaufmann, RN as Oncology Nurse Navigator Eppie Gibson, MD as Attending Physician (Radiation Oncology) Donnie Mesa, MD as Consulting Physician (General Surgery) Timica Marcom, Virgie Dad, MD as Consulting Physician (Oncology) Chauncey Cruel, MD OTHER MD:  CHIEF COMPLAINT: triple negative breast cancer  CURRENT TREATMENT: Neoadjuvant chemotherapy; granix   INTERVAL HISTORY: Arelys returns today for follow up and treatment of her triple negative breast cancer.   She completed the earlier part of her chemotherapy with cyclophosphamide and doxorubicin and started on weekly Paclitaxel and carboplatin .  Today she will receive her 8th of 12 weekly doses of Paclitaxel and Carboplatin.  She is tolerating this well.  She denies any peripheral neuropathy.   She received three doses of Granix after week 6 of treatment due to neutropenia causing a delay in cycle 6.  She tolerated this well.  Since her last visit, she underwent PET scan on 11/21/2019 showing: inferior left breast nodule with low level FDG uptake comp[atible with primary neoplasm; no evidence of hypermetabolic disease elsewhere.   REVIEW OF SYSTEMS: Julie is tolerating the Granix with mild fatigue and minimal bony aches.  She is using Compazine for nausea control and also using the Ativan at night as needed.  She has had no peripheral neuropathy.  She is trying to continue to walk for exercise but is having problems with her knees and wonders if she could tolerate a cortisone shot into the knee.  She sees Dr. Berline Chough for that.  Aside from these issues a detailed review of systems today was stable.    HISTORY OF CURRENT ILLNESS: From the  original intake note:  Lamoine Thompson had routine screening mammography on 06/21/2019 showing a possible abnormality in the left breast. She underwent left diagnostic mammography with tomography and left breast ultrasonography at [facility] on 07/01/2019 showing: breast density category C; either 2 adjacent masses or 1 complex dumbbell-shaped mass containing calcifications in the left breast at 6 o'clock, the largest of which measures 2.1 cm; chest wall invasion not excluded; 1-2 mm group of calcifications just anterior to the mass(es); single abnormal lymph node in the left axilla; vascular calcifications in anterior left breast.  Accordingly on 07/09/2019 she proceeded to biopsy of the left breast areas in question. The pathology from this procedure ([id number]) showed:  1. Left Breast, 5:30, posterior  - invasive ductal carcinoma with necrosis, grade 3  - Prognostic indicators significant for: estrogen receptor, 0% negative and progesterone receptor, 0% negative. Proliferation marker Ki67 at 60%. HER2 negative by immunohistochemistry (0). 2. Left Breast, 5:30, posterior  - invasive ductal carcinoma with necrosis, grade 3  - ductal carcinoma in situ, intermediate grade 3. Left Axilla, lymph node (1)  - metastatic carcinoma involving a lymph node  And additional biopsy of the anterior vascular calcifications in the left breast was performed on 07/10/2019. Pathology [id number]) showed: fibrocystic changes with calcifications; no malignancy identified.  This was felt to be concordant.  The patient's subsequent history is as detailed below.   PAST MEDICAL HISTORY: Past Medical History:  Diagnosis Date  . Asthma    no problems now  . Family history of basal cell carcinoma   . Family history of breast cancer   . Family history  of colon cancer   . Family history of kidney cancer   . Family history of leukemia   . Family history of peritoneal cancer   . GERD (gastroesophageal reflux disease)    . H/O hiatal hernia   . Hyperlipidemia   . Hypothyroidism   . IBS (irritable bowel syndrome)   . lt breast ca dx'd 07/10/19    PAST SURGICAL HISTORY: Past Surgical History:  Procedure Laterality Date  . ABDOMINAL HYSTERECTOMY     age 38  . BREAST SURGERY    . DILATION AND CURETTAGE OF UTERUS     2 miscarriages age 61 & 62  . FLEXIBLE SIGMOIDOSCOPY N/A 10/22/2013   Procedure: FLEXIBLE SIGMOIDOSCOPY;  Surgeon: Garlan Fair, MD;  Location: [facility];  Service: Endoscopy;  Laterality: N/A;  . KNEE ARTHROSCOPY Left   . PORTACATH PLACEMENT Right 07/29/2019   Procedure: INSERTION PORT-A-CATH WITH ULTRASOUND;  Surgeon: Donnie Mesa, MD;  Location: [facility];  Service: General;  Laterality: Right;  . TONSILLECTOMY     age 88    FAMILY HISTORY: Family History  Problem Relation Age of Onset  . Breast cancer Maternal Aunt        dx. in her 2s  . Cancer Mother 13       peritoneal cancer, death certificate lists ovarian cancer  . Colon cancer Paternal Uncle        dx. late 16s or early 71s  . Basal cell carcinoma Sister 16       a second diagnosed in her 27s  . Kidney cancer Maternal Grandmother 82  . Diabetes Paternal Grandmother   . Heart Problems Paternal Grandmother   . Leukemia Paternal Uncle        dx. early 75s   Patient's father is currently living at age 14 as of 08/19/19. Patient's mother died from ovarian cancer at age 42. She was diagnosed at age 66. The patient reports breast cancer in a maternal aunt, diagnosed in her 12's. The patient also noted colon cancer in a paternal uncle and kidney cancer in her maternal grandmother.  She has 1 sister.   GYNECOLOGIC HISTORY:  No LMP recorded. Patient has had a hysterectomy. Menarche: [age] years old Age at first live birth: 74 years old [employer] P 2 LMP age 63 (with hysterectomy) Contraceptive: never used HRT used for 10-12 years  Hysterectomy? Yes, age 63 BSO? 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  Husband [name] is a retired Psychologist, prison and probation services. She lives at home with husband Butch. She has two children from a prior marriage. Son [name], age 59, works as a Games developer in [place], [place]. Son [name], age 62, works as a Programme researcher, broadcasting/film/video in [place], [place]. The patient has 4 grandchildren and 4 great-grandchildren. She attends [employer].    ADVANCED DIRECTIVES: In the absence of any documentation to the contrary, the patient's spouse is their HCPOA.    HEALTH MAINTENANCE: Social History   Tobacco Use  . Smoking status: Never Smoker  . Smokeless tobacco: Never Used  Substance Use Topics  . Alcohol use: No  . Drug use: No     Colonoscopy: 09/2013, with CT virtual 11/2013  PAP: none on file, s/p hysterectomy  Bone density: 04/2001, -0.5   Allergies  Allergen Reactions  . Sulfa Antibiotics Rash  . Penicillins Hives    Current Outpatient Medications  Medication Sig Dispense Refill  .  aspirin EC 81 MG tablet Take 81 mg by mouth daily.    Marland Kitchen atorvastatin (LIPITOR) 20 MG tablet Take 20 mg by mouth daily.    . Cholecalciferol (VITAMIN D) 2000 UNITS tablet Take 2,000 Units by mouth daily.    Marland Kitchen co-enzyme Q-10 30 MG capsule Take 20 mg by mouth 3 (three) times daily.    Marland Kitchen esomeprazole (NEXIUM) 20 MG capsule Take 20 mg by mouth daily at 12 noon.    . famotidine (PEPCID) 20 MG tablet Take 40 mg by mouth 2 (two) times daily.    Marland Kitchen FIBER PO Take 1 tablet by mouth daily.    . fluconazole (DIFLUCAN) 200 MG tablet Take 1 tablet (200 mg total) by mouth daily. 30 tablet 0  . HYDROcodone-acetaminophen (NORCO/VICODIN) 5-325 MG tablet Take 1 tablet by mouth every 6 (six) hours as needed for moderate pain. 15 tablet 0  . levothyroxine (SYNTHROID) 50 MCG tablet Take 50 mcg by mouth daily.    Marland Kitchen lidocaine-prilocaine (EMLA) cream Apply 1 application topically as needed. 30 g 0  . loratadine (CLARITIN) 10 MG  tablet Take 1 tablet (10 mg total) by mouth daily. 60 tablet 1  . LORazepam (ATIVAN) 0.5 MG tablet TAKE 1 TABLET(0.5 MG) BY MOUTH AT BEDTIME AS NEEDED FOR NAUSEA OR VOMITING 30 tablet 0  . Multiple Vitamin (MULTIVITAMIN WITH MINERALS) TABS tablet Take 1 tablet by mouth daily.    . nabumetone (RELAFEN) 500 MG tablet Take 500 mg by mouth 2 (two) times daily as needed.    . Omega-3 Fatty Acids (FISH OIL) 1200 MG CAPS Take 1,200 mg by mouth daily.    . prochlorperazine (COMPAZINE) 10 MG tablet Take 1 tablet (10 mg total) by mouth every 6 (six) hours as needed (Nausea or vomiting). 30 tablet 1  . valACYclovir (VALTREX) 500 MG tablet Take 1 tablet (500 mg total) by mouth 2 (two) times daily. 180 tablet 0   No current facility-administered medications for this visit.    OBJECTIVE: White woman in no acute distress Vitals:   11/26/19 0935  BP: 136/72  Pulse: 94  Resp: 18  Temp: 98.5 F (36.9 C)  SpO2: 99%     Body mass index is 36.79 kg/m.   Wt Readings from Last 3 Encounters:  11/26/19 188 lb 6.4 oz (85.5 kg)  11/19/19 187 lb 14.4 oz (85.2 kg)  11/12/19 190 lb 11.2 oz (86.5 kg)  ECOG FS:1 - Symptomatic but completely ambulatory  Sclerae unicteric, EOMs intact Wearing a mask No cervical or supraclavicular adenopathy Lungs no rales or rhonchi Heart regular rate and rhythm Abd soft, nontender, positive bowel sounds MSK no focal spinal tenderness, no upper extremity lymphedema Neuro: nonfocal, well oriented, appropriate affect Breasts: Deferred   LAB RESULTS:  CMP     Component Value Date/Time   NA 144 11/26/2019 0900   K 3.9 11/26/2019 0900   CL 112 (H) 11/26/2019 0900   CO2 25 11/26/2019 0900   GLUCOSE 116 (H) 11/26/2019 0900   BUN 13 11/26/2019 0900   CREATININE 0.74 11/26/2019 0900   CREATININE 0.94 07/17/2019 0823   CALCIUM 8.8 (L) 11/26/2019 0900   PROT 6.1 (L) 11/26/2019 0900   ALBUMIN 3.5 11/26/2019 0900   AST 14 (L) 11/26/2019 0900   AST 21 07/17/2019 0823   ALT  12 11/26/2019 0900   ALT 18 07/17/2019 0823   ALKPHOS 111 11/26/2019 0900   BILITOT 0.2 (L) 11/26/2019 0900   BILITOT 0.4 07/17/2019 0823   GFRNONAA >60  11/26/2019 0900   GFRNONAA >60 07/17/2019 0823   GFRAA >60 11/26/2019 0900   GFRAA >60 07/17/2019 0823    No results found for: TOTALPROTELP, ALBUMINELP, A1GS, A2GS, BETS, BETA2SER, GAMS, MSPIKE, SPEI  Lab Results  Component Value Date   WBC 3.9 (L) 11/26/2019   NEUTROABS 2.6 11/26/2019   HGB 9.2 (L) 11/26/2019   HCT 29.1 (L) 11/26/2019   MCV 105.4 (H) 11/26/2019   PLT 184 11/26/2019    No results found for: LABCA2  No components found for: [id number]  No results for input(s): INR in the last 168 hours.  No results found for: LABCA2  No results found for: DUK025  No results found for: KYH062  No results found for: BJS283  No results found for: CA2729  No components found for: HGQUANT  No results found for: CEA1 / No results found for: CEA1   No results found for: AFPTUMOR  No results found for: CHROMOGRNA  No results found for: KPAFRELGTCHN, LAMBDASER, KAPLAMBRATIO (kappa/lambda light chains)  No results found for: HGBA, HGBA2QUANT, HGBFQUANT, HGBSQUAN (Hemoglobinopathy evaluation)   No results found for: LDH  No results found for: IRON, TIBC, IRONPCTSAT (Iron and TIBC)  No results found for: FERRITIN  Urinalysis No results found for: COLORURINE, APPEARANCEUR, LABSPEC, PHURINE, GLUCOSEU, HGBUR, BILIRUBINUR, KETONESUR, PROTEINUR, UROBILINOGEN, NITRITE, LEUKOCYTESUR   STUDIES: MR BREAST BILATERAL W WO CONTRAST INC CAD  Result Date: 11/18/2019 CLINICAL DATA:  Follow-up known left breast cancer. The patient has undergone neoadjuvant therapy. LABS:  None EXAM: BILATERAL BREAST MRI WITH AND WITHOUT CONTRAST TECHNIQUE: Multiplanar, multisequence MR images of both breasts were obtained prior to and following the intravenous administration of 10 ml of Gadavist Three-dimensional MR images were rendered by  post-processing of the original MR data on an independent workstation. The three-dimensional MR images were interpreted, and findings are reported in the following complete MRI report for this study. Three dimensional images were evaluated at the independent DynaCad workstation COMPARISON:  July 25, 2019 FINDINGS: Breast composition: c. Heterogeneous fibroglandular tissue. Background parenchymal enhancement: Mild Right breast: No mass or abnormal enhancement. Left breast: There has been significant interval improvement. The patient's malignancy was dumbbell-shaped with both an anterior and posterior portion. There is no enhancement in the region of the posterior portion of the known malignancy. There is peripheral enhancement around the anterior portion of the known malignancy measuring 1.5 by 1.2 by 1.8 cm in the AP, transverse, and craniocaudal dimensions. The remaining enhancement abuts the skin inferiorly with possible dimpling seen on coronal images. Lymph nodes: No abnormal appearing lymph nodes. Ancillary findings:  None. IMPRESSION: 1. No abnormalities identified in the right breast. 2. The patient's biopsied malignancy in the left breast was dumbbell shaped with anterior and posterior portions. There is no abnormal enhancement associated with the posterior portion. There is peripheral ring-like enhancement around the anterior portion measuring 1.5 x 1.2 x 1.8 cm, extending to the skin overlying the inferior aspect of the mass with slight skin dimpling suggested on coronal imaging. Skin involvement cannot be definitively determined on these images. 3. No adenopathy. RECOMMENDATION: Recommend continued surgical and oncologic follow up. BI-RADS CATEGORY  6: Known biopsy-proven malignancy. Electronically Signed   By: Dorise Bullion III M.D   On: 11/18/2019 16:06   NM PET Image Restag (PS) Skull Base To Thigh  Result Date: 11/21/2019 CLINICAL DATA:  Subsequent treatment strategy for breast cancer. EXAM:  NUCLEAR MEDICINE PET SKULL BASE TO THIGH TECHNIQUE: 9.3 mCi F-18 FDG was injected intravenously. Full-ring PET  imaging was performed from the skull base to thigh after the radiotracer. CT data was obtained and used for attenuation correction and anatomic localization. Fasting blood glucose: 84 mg/dl COMPARISON:  None. FINDINGS: Mediastinal blood pool activity: SUV max 2.3 Liver activity: SUV max NA NECK: No hypermetabolic lymph nodes in the neck. Incidental CT findings: none CHEST: No hypermetabolic mediastinal or hilar nodes. No hypermetabolic axillary lymphadenopathy. No suspicious pulmonary nodules on the CT scan. 12 mm nodule inferior left breast shows low level FDG uptake with SUV max = 2.7. Incidental CT findings: Heart size upper normal. Right Port-A-Cath tip is positioned in the mid right atrium. Atherosclerotic calcification is noted in the wall of the thoracic aorta. ABDOMEN/PELVIS: No abnormal hypermetabolic activity within the liver, pancreas, adrenal glands, or spleen. No hypermetabolic lymph nodes in the abdomen or pelvis. Incidental CT findings: There is abdominal aortic atherosclerosis without aneurysm. SKELETON: No focal hypermetabolic activity to suggest skeletal metastasis. Incidental CT findings: No worrisome lytic or sclerotic osseous abnormality. IMPRESSION: 1. Inferior left breast nodule shows low level FDG uptake, compatible with primary neoplasm. 2. No evidence for hypermetabolic metastatic disease in the neck, chest, abdomen, or pelvis. 3.  Aortic Atherosclerois (ICD10-170.0) Electronically Signed   By: Misty Stanley M.D.   On: 11/21/2019 11:05     ELIGIBLE FOR AVAILABLE RESEARCH PROTOCOL: no  ASSESSMENT: 74 y.o. [employer] woman status post left breast lower outer quadrant biopsy 07/09/2019 for a clinical T2 N1, stage IIIB invasive ductal carcinoma, grade 3, triple negative, with an MIB-1 of 60%  (a) chest CT scan and bone scan 07/31/2019 showed no evidence of metastatic  disease  (b) MRI biopsy of 2 additional areas in the left breast (central, 07/10/2019, and at 8:30 o'clock on 07/26/2019) are both benign and concordant  (c) biopsy of 2 suspicious areas in the right breast on 07/26/2019 (9:00 and lower outer quadrant) were benign and concordant  (d) PET scan 11/21/2019 shows a 1.2 cm nodule in the left breast with an SUV max of 2.7, aortic calcification, but no lung liver or bone lesions, no evidence of metastases   (1) neoadjuvant chemotherapy consisting of cyclophosphamide and doxorubicin in dose dense fashion x4 started 07/30/2019, completed 09/10/2019, followed by weekly carboplatin and paclitaxel x12 started 09/24/2019  (a) cycle 6 delayed 2 weeks because of anemia and thrombocytopenia, neutropenia  (2) definitive surgery to follow with targeted axillary lymph node dissection  (3) adjuvant radiation to follow  (4) genetics testing 07/23/2019 through the STAT Breast cancer panel offered by [facility] found no deleterious mutations in ATM, BRCA1, BRCA2, CDH1, CHEK2, PALB2, PTEN, STK11 and TP53.  Additional testing through the Common Hereditary Cancers Panel offered by [facility] also found no deleterious mutations in IPC, ATM, AXIN2, BARD1, BMPR1A, BRCA1, BRCA2, BRIP1, CDH1, CDK4, CDKN2A (p14ARF), CDKN2A (p16INK4a), CHEK2, CTNNA1, DICER1, EPCAM (Deletion/duplication testing only), GREM1 (promoter region deletion/duplication testing only), KIT, MEN1, MLH1, MSH2, MSH3, MSH6, MUTYH, NBN, NF1, NHTL1, PALB2, PDGFRA, PMS2, POLD1, POLE, PTEN, RAD50, RAD51C, RAD51D, RNF43, SDHB, SDHC, SDHD, SMAD4, SMARCA4. STK11, TP53, TSC1, TSC2, and VHL.  The following genes were evaluated for sequence changes only: SDHA and HOXB13 c.251G>A variant only.   (a) 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    PLAN: Shaneeka continues to tolerate chemotherapy remarkably well.  She is moderately anemic and we are having to give her a little bit of a boost for  the white cells after each chemo dose.  She receives Granix on days 3  and 4 of each cycle.  She has had minimal side effects from that.  She is proceeding to cycle 8 today.  She has 4 more to go.  It is favorable that she has had no peripheral neuropathy symptoms to date.  I reviewed the results of her PET scan with her which are very favorable.  She was very pleased of course to get the news  I gave her a copy of her labs so that if she does see Dr. Demetrius Revel and he decides to give her shot in her knee he can have the data.  I do not see a major issue with that procedure at all in her current situation, and if it helps her walk that certainly would be helpful  I will be seeing her again in a week.  She knows to call for any other issue that may develop before then  Total encounter time 30 minutes.Sarajane Jews C. Lael Wetherbee, MD 11/26/19 10:05 AM Medical Oncology and Hematology [facility] [place], [place] [address] Tel. [phone]    Fax. [phone]   I, Wilburn Mylar, am acting as scribe for Dr. Virgie Dad. Franko Hilliker.  I, Lurline Del MD, have reviewed the above documentation for accuracy and completeness, and I agree with the above.    *Total Encounter Time as defined by the Centers for Medicare and Medicaid Services includes, in addition to the face-to-face time of a patient visit (documented in the note above) non-face-to-face time: obtaining and reviewing outside history, ordering and reviewing medications, tests or procedures, care coordination (communications with other health care professionals or caregivers) and documentation in the medical record.

## 2019-11-26 ENCOUNTER — Inpatient Hospital Stay: Payer: Medicare Other | Admitting: Oncology

## 2019-11-26 ENCOUNTER — Inpatient Hospital Stay: Payer: Medicare Other

## 2019-11-26 ENCOUNTER — Other Ambulatory Visit: Payer: Self-pay | Admitting: Adult Health

## 2019-11-26 ENCOUNTER — Encounter: Payer: Self-pay | Admitting: *Deleted

## 2019-11-26 ENCOUNTER — Other Ambulatory Visit: Payer: Self-pay

## 2019-11-26 VITALS — BP 136/72 | HR 94 | Temp 98.5°F | Resp 18 | Ht 60.0 in | Wt 188.4 lb

## 2019-11-26 DIAGNOSIS — Z90710 Acquired absence of both cervix and uterus: Secondary | ICD-10-CM | POA: Diagnosis not present

## 2019-11-26 DIAGNOSIS — C773 Secondary and unspecified malignant neoplasm of axilla and upper limb lymph nodes: Secondary | ICD-10-CM | POA: Diagnosis not present

## 2019-11-26 DIAGNOSIS — C50512 Malignant neoplasm of lower-outer quadrant of left female breast: Secondary | ICD-10-CM

## 2019-11-26 DIAGNOSIS — Z5111 Encounter for antineoplastic chemotherapy: Secondary | ICD-10-CM | POA: Diagnosis not present

## 2019-11-26 DIAGNOSIS — Z171 Estrogen receptor negative status [ER-]: Secondary | ICD-10-CM

## 2019-11-26 DIAGNOSIS — Z1589 Genetic susceptibility to other disease: Secondary | ICD-10-CM | POA: Diagnosis not present

## 2019-11-26 DIAGNOSIS — Z791 Long term (current) use of non-steroidal anti-inflammatories (NSAID): Secondary | ICD-10-CM | POA: Diagnosis not present

## 2019-11-26 DIAGNOSIS — I700 Atherosclerosis of aorta: Secondary | ICD-10-CM

## 2019-11-26 DIAGNOSIS — Z95828 Presence of other vascular implants and grafts: Secondary | ICD-10-CM

## 2019-11-26 DIAGNOSIS — D696 Thrombocytopenia, unspecified: Secondary | ICD-10-CM | POA: Diagnosis not present

## 2019-11-26 DIAGNOSIS — Z800 Family history of malignant neoplasm of digestive organs: Secondary | ICD-10-CM | POA: Diagnosis not present

## 2019-11-26 DIAGNOSIS — Z803 Family history of malignant neoplasm of breast: Secondary | ICD-10-CM | POA: Diagnosis not present

## 2019-11-26 DIAGNOSIS — Z806 Family history of leukemia: Secondary | ICD-10-CM | POA: Diagnosis not present

## 2019-11-26 DIAGNOSIS — Z8051 Family history of malignant neoplasm of kidney: Secondary | ICD-10-CM | POA: Diagnosis not present

## 2019-11-26 DIAGNOSIS — J45909 Unspecified asthma, uncomplicated: Secondary | ICD-10-CM | POA: Diagnosis not present

## 2019-11-26 DIAGNOSIS — D701 Agranulocytosis secondary to cancer chemotherapy: Secondary | ICD-10-CM | POA: Diagnosis not present

## 2019-11-26 DIAGNOSIS — Z7982 Long term (current) use of aspirin: Secondary | ICD-10-CM | POA: Diagnosis not present

## 2019-11-26 DIAGNOSIS — Z79899 Other long term (current) drug therapy: Secondary | ICD-10-CM | POA: Diagnosis not present

## 2019-11-26 LAB — COMPREHENSIVE METABOLIC PANEL
ALT: 12 U/L (ref 0–44)
AST: 14 U/L — ABNORMAL LOW (ref 15–41)
Albumin: 3.5 g/dL (ref 3.5–5.0)
Alkaline Phosphatase: 111 U/L (ref 38–126)
Anion gap: 7 (ref 5–15)
BUN: 13 mg/dL (ref 8–23)
CO2: 25 mmol/L (ref 22–32)
Calcium: 8.8 mg/dL — ABNORMAL LOW (ref 8.9–10.3)
Chloride: 112 mmol/L — ABNORMAL HIGH (ref 98–111)
Creatinine, Ser: 0.74 mg/dL (ref 0.44–1.00)
GFR calc Af Amer: >60 mL/min (ref >60)
GFR calc non Af Amer: >60 mL/min (ref >60)
Glucose, Bld: 116 mg/dL — ABNORMAL HIGH (ref 70–99)
Potassium: 3.9 mmol/L (ref 3.5–5.1)
Sodium: 144 mmol/L (ref 135–145)
Total Bilirubin: 0.2 mg/dL — ABNORMAL LOW (ref 0.3–1.2)
Total Protein: 6.1 g/dL — ABNORMAL LOW (ref 6.5–8.1)

## 2019-11-26 LAB — CBC WITH DIFFERENTIAL/PLATELET
Abs Immature Granulocytes: 0.15 10*3/uL — ABNORMAL HIGH (ref 0.00–0.07)
Basophils Absolute: 0 10*3/uL (ref 0.0–0.1)
Basophils Relative: 1 %
Eosinophils Absolute: 0 10*3/uL (ref 0.0–0.5)
Eosinophils Relative: 1 %
HCT: 29.1 % — ABNORMAL LOW (ref 36.0–46.0)
Hemoglobin: 9.2 g/dL — ABNORMAL LOW (ref 12.0–15.0)
Immature Granulocytes: 4 %
Lymphocytes Relative: 13 %
Lymphs Abs: 0.5 10*3/uL — ABNORMAL LOW (ref 0.7–4.0)
MCH: 33.3 pg (ref 26.0–34.0)
MCHC: 31.6 g/dL (ref 30.0–36.0)
MCV: 105.4 fL — ABNORMAL HIGH (ref 80.0–100.0)
Monocytes Absolute: 0.6 10*3/uL (ref 0.1–1.0)
Monocytes Relative: 15 %
Neutro Abs: 2.6 10*3/uL (ref 1.7–7.7)
Neutrophils Relative %: 66 %
Platelets: 184 10*3/uL (ref 150–400)
RBC: 2.76 MIL/uL — ABNORMAL LOW (ref 3.87–5.11)
RDW: 17.9 % — ABNORMAL HIGH (ref 11.5–15.5)
WBC: 3.9 10*3/uL — ABNORMAL LOW (ref 4.0–10.5)
nRBC: 1.3 % — ABNORMAL HIGH (ref 0.0–0.2)

## 2019-11-26 MED ORDER — SODIUM CHLORIDE 0.9% FLUSH
10.0000 mL | INTRAVENOUS | Status: DC | PRN
  Administered 2019-11-26: 10 mL via INTRAVENOUS
  Filled 2019-11-26: qty 10

## 2019-11-26 MED ORDER — FAMOTIDINE IN NACL 20-0.9 MG/50ML-% IV SOLN
INTRAVENOUS | Status: AC
  Filled 2019-11-26: qty 50

## 2019-11-26 MED ORDER — SODIUM CHLORIDE 0.9 % IV SOLN
Freq: Once | INTRAVENOUS | Status: AC
  Filled 2019-11-26: qty 250

## 2019-11-26 MED ORDER — HEPARIN SOD (PORK) LOCK FLUSH 100 UNIT/ML IV SOLN
500.0000 [IU] | Freq: Once | INTRAVENOUS | Status: AC | PRN
  Administered 2019-11-26: 500 [IU]
  Filled 2019-11-26: qty 5

## 2019-11-26 MED ORDER — SODIUM CHLORIDE 0.9 % IV SOLN
182.8000 mg | Freq: Once | INTRAVENOUS | Status: AC
  Administered 2019-11-26: 180 mg via INTRAVENOUS
  Filled 2019-11-26: qty 18

## 2019-11-26 MED ORDER — DIPHENHYDRAMINE HCL 50 MG/ML IJ SOLN
INTRAMUSCULAR | Status: AC
  Filled 2019-11-26: qty 1

## 2019-11-26 MED ORDER — SODIUM CHLORIDE 0.9% FLUSH
10.0000 mL | INTRAVENOUS | Status: DC | PRN
  Administered 2019-11-26: 10 mL
  Filled 2019-11-26: qty 10

## 2019-11-26 MED ORDER — SODIUM CHLORIDE 0.9 % IV SOLN
80.0000 mg/m2 | Freq: Once | INTRAVENOUS | Status: AC
  Administered 2019-11-26: 150 mg via INTRAVENOUS
  Filled 2019-11-26: qty 25

## 2019-11-26 MED ORDER — DIPHENHYDRAMINE HCL 50 MG/ML IJ SOLN
50.0000 mg | Freq: Once | INTRAMUSCULAR | Status: AC
  Administered 2019-11-26: 50 mg via INTRAVENOUS

## 2019-11-26 MED ORDER — PALONOSETRON HCL INJECTION 0.25 MG/5ML
INTRAVENOUS | Status: AC
  Filled 2019-11-26: qty 5

## 2019-11-26 MED ORDER — SODIUM CHLORIDE 0.9 % IV SOLN
150.0000 mg | Freq: Once | INTRAVENOUS | Status: AC
  Administered 2019-11-26: 150 mg via INTRAVENOUS
  Filled 2019-11-26: qty 150

## 2019-11-26 MED ORDER — SODIUM CHLORIDE 0.9 % IV SOLN
10.0000 mg | Freq: Once | INTRAVENOUS | Status: AC
  Administered 2019-11-26: 10 mg via INTRAVENOUS
  Filled 2019-11-26: qty 10

## 2019-11-26 MED ORDER — FAMOTIDINE IN NACL 20-0.9 MG/50ML-% IV SOLN
20.0000 mg | Freq: Once | INTRAVENOUS | Status: AC
  Administered 2019-11-26: 20 mg via INTRAVENOUS

## 2019-11-26 MED ORDER — PALONOSETRON HCL INJECTION 0.25 MG/5ML
0.2500 mg | Freq: Once | INTRAVENOUS | Status: AC
  Administered 2019-11-26: 0.25 mg via INTRAVENOUS

## 2019-11-26 NOTE — Patient Instructions (Signed)

## 2019-11-26 NOTE — Progress Notes (Signed)
Pharmacist Chemotherapy Monitoring - Follow Up Assessment    I verify that I have reviewed each item in the below checklist:   Regimen for the patient is scheduled for the appropriate day and plan matches scheduled date.  Appropriate non-routine labs are ordered dependent on drug ordered.  If applicable, additional medications reviewed and ordered per protocol based on lifetime cumulative doses and/or treatment regimen.   Plan for follow-up and/or issues identified: No  I-vent associated with next due treatment: No  MD and/or nursing notified: No  Kraig Genis K 11/26/2019 4:00 PM

## 2019-11-26 NOTE — Patient Instructions (Signed)
[facility] Discharge Instructions for Patients Receiving Chemotherapy  Today you received the following chemotherapy agents Paclitaxel (TAXOL) & Carboplatin (PARAPLATIN).  To help prevent nausea and vomiting after your treatment, we encourage you to take your nausea medication as prescribed.  If you develop nausea and vomiting that is not controlled by your nausea medication, call the clinic.   BELOW ARE SYMPTOMS THAT SHOULD BE REPORTED IMMEDIATELY:  *FEVER GREATER THAN 100.5 F  *CHILLS WITH OR WITHOUT FEVER  NAUSEA AND VOMITING THAT IS NOT CONTROLLED WITH YOUR NAUSEA MEDICATION  *UNUSUAL SHORTNESS OF BREATH  *UNUSUAL BRUISING OR BLEEDING  TENDERNESS IN MOUTH AND THROAT WITH OR WITHOUT PRESENCE OF ULCERS  *URINARY PROBLEMS  *BOWEL PROBLEMS  UNUSUAL RASH Items with * indicate a potential emergency and should be followed up as soon as possible.  Feel free to call the clinic should you have any questions or concerns. The clinic phone number is [phone].  Please show the CHEMO ALERT CARD at check-in to the Emergency Department and triage nurse.   

## 2019-11-27 ENCOUNTER — Telehealth: Payer: Self-pay | Admitting: Oncology

## 2019-11-27 NOTE — Telephone Encounter (Signed)
Scheduled appts per 5/25 los. Pt confirmed appt date and time.

## 2019-11-28 ENCOUNTER — Other Ambulatory Visit: Payer: Self-pay

## 2019-11-28 ENCOUNTER — Inpatient Hospital Stay: Payer: Medicare Other

## 2019-11-28 VITALS — BP 135/60 | HR 89 | Temp 98.2°F | Resp 20

## 2019-11-28 DIAGNOSIS — Z8051 Family history of malignant neoplasm of kidney: Secondary | ICD-10-CM | POA: Diagnosis not present

## 2019-11-28 DIAGNOSIS — Z79899 Other long term (current) drug therapy: Secondary | ICD-10-CM | POA: Diagnosis not present

## 2019-11-28 DIAGNOSIS — C50512 Malignant neoplasm of lower-outer quadrant of left female breast: Secondary | ICD-10-CM

## 2019-11-28 DIAGNOSIS — Z7982 Long term (current) use of aspirin: Secondary | ICD-10-CM | POA: Diagnosis not present

## 2019-11-28 DIAGNOSIS — C773 Secondary and unspecified malignant neoplasm of axilla and upper limb lymph nodes: Secondary | ICD-10-CM | POA: Diagnosis not present

## 2019-11-28 DIAGNOSIS — Z171 Estrogen receptor negative status [ER-]: Secondary | ICD-10-CM | POA: Diagnosis not present

## 2019-11-28 DIAGNOSIS — Z5111 Encounter for antineoplastic chemotherapy: Secondary | ICD-10-CM | POA: Diagnosis not present

## 2019-11-28 DIAGNOSIS — Z803 Family history of malignant neoplasm of breast: Secondary | ICD-10-CM | POA: Diagnosis not present

## 2019-11-28 DIAGNOSIS — Z791 Long term (current) use of non-steroidal anti-inflammatories (NSAID): Secondary | ICD-10-CM | POA: Diagnosis not present

## 2019-11-28 DIAGNOSIS — Z90710 Acquired absence of both cervix and uterus: Secondary | ICD-10-CM | POA: Diagnosis not present

## 2019-11-28 DIAGNOSIS — Z1589 Genetic susceptibility to other disease: Secondary | ICD-10-CM | POA: Diagnosis not present

## 2019-11-28 DIAGNOSIS — J45909 Unspecified asthma, uncomplicated: Secondary | ICD-10-CM | POA: Diagnosis not present

## 2019-11-28 DIAGNOSIS — Z800 Family history of malignant neoplasm of digestive organs: Secondary | ICD-10-CM | POA: Diagnosis not present

## 2019-11-28 DIAGNOSIS — Z806 Family history of leukemia: Secondary | ICD-10-CM | POA: Diagnosis not present

## 2019-11-28 DIAGNOSIS — Z95828 Presence of other vascular implants and grafts: Secondary | ICD-10-CM

## 2019-11-28 DIAGNOSIS — D696 Thrombocytopenia, unspecified: Secondary | ICD-10-CM | POA: Diagnosis not present

## 2019-11-28 DIAGNOSIS — D701 Agranulocytosis secondary to cancer chemotherapy: Secondary | ICD-10-CM | POA: Diagnosis not present

## 2019-11-28 MED ORDER — FILGRASTIM-SNDZ 480 MCG/0.8ML IJ SOSY
PREFILLED_SYRINGE | INTRAMUSCULAR | Status: AC
  Filled 2019-11-28: qty 0.8

## 2019-11-28 MED ORDER — FILGRASTIM-SNDZ 480 MCG/0.8ML IJ SOSY
480.0000 ug | PREFILLED_SYRINGE | Freq: Once | INTRAMUSCULAR | Status: AC
  Administered 2019-11-28: 480 ug via SUBCUTANEOUS

## 2019-11-28 NOTE — Patient Instructions (Signed)

## 2019-11-29 ENCOUNTER — Inpatient Hospital Stay: Payer: Medicare Other

## 2019-11-29 ENCOUNTER — Other Ambulatory Visit: Payer: Self-pay

## 2019-11-29 VITALS — BP 128/72 | HR 78 | Temp 98.2°F | Resp 18

## 2019-11-29 DIAGNOSIS — J45909 Unspecified asthma, uncomplicated: Secondary | ICD-10-CM | POA: Diagnosis not present

## 2019-11-29 DIAGNOSIS — C773 Secondary and unspecified malignant neoplasm of axilla and upper limb lymph nodes: Secondary | ICD-10-CM | POA: Diagnosis not present

## 2019-11-29 DIAGNOSIS — Z79899 Other long term (current) drug therapy: Secondary | ICD-10-CM | POA: Diagnosis not present

## 2019-11-29 DIAGNOSIS — Z806 Family history of leukemia: Secondary | ICD-10-CM | POA: Diagnosis not present

## 2019-11-29 DIAGNOSIS — D696 Thrombocytopenia, unspecified: Secondary | ICD-10-CM | POA: Diagnosis not present

## 2019-11-29 DIAGNOSIS — Z803 Family history of malignant neoplasm of breast: Secondary | ICD-10-CM | POA: Diagnosis not present

## 2019-11-29 DIAGNOSIS — Z90710 Acquired absence of both cervix and uterus: Secondary | ICD-10-CM | POA: Diagnosis not present

## 2019-11-29 DIAGNOSIS — Z8051 Family history of malignant neoplasm of kidney: Secondary | ICD-10-CM | POA: Diagnosis not present

## 2019-11-29 DIAGNOSIS — Z171 Estrogen receptor negative status [ER-]: Secondary | ICD-10-CM | POA: Diagnosis not present

## 2019-11-29 DIAGNOSIS — Z1589 Genetic susceptibility to other disease: Secondary | ICD-10-CM | POA: Diagnosis not present

## 2019-11-29 DIAGNOSIS — Z95828 Presence of other vascular implants and grafts: Secondary | ICD-10-CM

## 2019-11-29 DIAGNOSIS — Z800 Family history of malignant neoplasm of digestive organs: Secondary | ICD-10-CM | POA: Diagnosis not present

## 2019-11-29 DIAGNOSIS — Z5111 Encounter for antineoplastic chemotherapy: Secondary | ICD-10-CM | POA: Diagnosis not present

## 2019-11-29 DIAGNOSIS — D701 Agranulocytosis secondary to cancer chemotherapy: Secondary | ICD-10-CM | POA: Diagnosis not present

## 2019-11-29 DIAGNOSIS — Z791 Long term (current) use of non-steroidal anti-inflammatories (NSAID): Secondary | ICD-10-CM | POA: Diagnosis not present

## 2019-11-29 DIAGNOSIS — C50512 Malignant neoplasm of lower-outer quadrant of left female breast: Secondary | ICD-10-CM | POA: Diagnosis not present

## 2019-11-29 DIAGNOSIS — Z7982 Long term (current) use of aspirin: Secondary | ICD-10-CM | POA: Diagnosis not present

## 2019-11-29 MED ORDER — FILGRASTIM-SNDZ 480 MCG/0.8ML IJ SOSY
PREFILLED_SYRINGE | INTRAMUSCULAR | Status: AC
  Filled 2019-11-29: qty 0.8

## 2019-11-29 MED ORDER — FILGRASTIM-SNDZ 480 MCG/0.8ML IJ SOSY
480.0000 ug | PREFILLED_SYRINGE | Freq: Once | INTRAMUSCULAR | Status: AC
  Administered 2019-11-29: 480 ug via SUBCUTANEOUS

## 2019-12-02 NOTE — Progress Notes (Signed)
[place]  [phone] [phone]     ID: [name] DOB: February 23, 1946  MR#: [id number]  XBD#:[id number]  Patient Care Team: Leeroy Cha, MD as PCP - General (Internal Medicine) Rockwell Germany, RN as Oncology Nurse Navigator Mauro Kaufmann, RN as Oncology Nurse Navigator Eppie Gibson, MD as Attending Physician (Radiation Oncology) Donnie Mesa, MD as Consulting Physician (General Surgery) Corinda Ammon, Virgie Dad, MD as Consulting Physician (Oncology) Melrose Nakayama, MD as Consulting Physician (Orthopedic Surgery) Chauncey Cruel, MD OTHER MD:  CHIEF COMPLAINT: triple negative breast cancer  CURRENT TREATMENT: Neoadjuvant chemotherapy; granix   INTERVAL HISTORY: Lekita returns today for follow up and treatment of her triple negative breast cancer.   She continues on weekly Paclitaxel and carboplatin.  Today she will receive her 9th of 12 planned doses. She is tolerating this well aside from low white cell count issues..  She denies any peripheral neuropathy.   She receives two doses of Granix after each treatment because of a week's delay after treatment #5 t due to neutropenia causing a delay in cycle 6.  This has allowed her to receive her remaining treatments on time   REVIEW OF SYSTEMS: Tashay feels fine on chemo day on the next day but after receiving the Granix she feels down for about 3 days.  She then picks up again.  She had a busy weekend, doing a lot of housework.  She has no peripheral neuropathy her port is working well.  She has no mouth sores.  In general her functional status is very stable.  A detailed review of systems was otherwise noncontributory    HISTORY OF CURRENT ILLNESS: From the original intake note:  Kearia Yin had routine screening mammography on 06/21/2019 showing a possible abnormality in the left breast. She underwent left diagnostic mammography with tomography and left breast  ultrasonography at [facility] on 07/01/2019 showing: breast density category C; either 2 adjacent masses or 1 complex dumbbell-shaped mass containing calcifications in the left breast at 6 o'clock, the largest of which measures 2.1 cm; chest wall invasion not excluded; 1-2 mm group of calcifications just anterior to the mass(es); single abnormal lymph node in the left axilla; vascular calcifications in anterior left breast.  Accordingly on 07/09/2019 she proceeded to biopsy of the left breast areas in question. The pathology from this procedure ([id number]) showed:  1. Left Breast, 5:30, posterior  - invasive ductal carcinoma with necrosis, grade 3  - Prognostic indicators significant for: estrogen receptor, 0% negative and progesterone receptor, 0% negative. Proliferation marker Ki67 at 60%. HER2 negative by immunohistochemistry (0). 2. Left Breast, 5:30, posterior  - invasive ductal carcinoma with necrosis, grade 3  - ductal carcinoma in situ, intermediate grade 3. Left Axilla, lymph node (1)  - metastatic carcinoma involving a lymph node  And additional biopsy of the anterior vascular calcifications in the left breast was performed on 07/10/2019. Pathology [id number]) showed: fibrocystic changes with calcifications; no malignancy identified.  This was felt to be concordant.  The patient's subsequent history is as detailed below.   PAST MEDICAL HISTORY: Past Medical History:  Diagnosis Date   Asthma    no problems now   Family history of basal cell carcinoma    Family history of breast cancer    Family history of colon cancer    Family history of kidney cancer    Family history of leukemia    Family history of peritoneal cancer    GERD (gastroesophageal reflux  disease)    H/O hiatal hernia    Hyperlipidemia    Hypothyroidism    IBS (irritable bowel syndrome)    lt breast ca dx'd 07/10/19    PAST SURGICAL HISTORY: Past Surgical History:  Procedure Laterality Date     ABDOMINAL HYSTERECTOMY     age 89   BREAST SURGERY     DILATION AND CURETTAGE OF UTERUS     2 miscarriages age 72 & 59   FLEXIBLE SIGMOIDOSCOPY N/A 10/22/2013   Procedure: FLEXIBLE SIGMOIDOSCOPY;  Surgeon: Garlan Fair, MD;  Location: [facility];  Service: Endoscopy;  Laterality: N/A;   KNEE ARTHROSCOPY Left    PORTACATH PLACEMENT Right 07/29/2019   Procedure: INSERTION PORT-A-CATH WITH ULTRASOUND;  Surgeon: Donnie Mesa, MD;  Location: [facility];  Service: General;  Laterality: Right;   TONSILLECTOMY     age 38    FAMILY HISTORY: Family History  Problem Relation Age of Onset   Breast cancer Maternal Aunt        dx. in her 65s   Cancer Mother 9       peritoneal cancer, death certificate lists ovarian cancer   Colon cancer Paternal Uncle        dx. late 72s or early 49s   Basal cell carcinoma Sister 61       a second diagnosed in her 70s   Kidney cancer Maternal Grandmother 71   Diabetes Paternal Grandmother    Heart Problems Paternal Grandmother    Leukemia Paternal Uncle        dx. early 32s   Patient's father is currently living at age 36 as of 08/11/19. Patient's mother died from ovarian cancer at age 66. She was diagnosed at age 44. The patient reports breast cancer in a maternal aunt, diagnosed in her 59's. The patient also noted colon cancer in a paternal uncle and kidney cancer in her maternal grandmother.  She has 1 sister.   GYNECOLOGIC HISTORY:  No LMP recorded. Patient has had a hysterectomy. Menarche: [age] years old Age at first live birth: 74 years old [place] P 2 LMP age 6 (with hysterectomy) Contraceptive: never used HRT used for 10-12 years  Hysterectomy? Yes, age 69 BSO? 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  Husband [name] is a retired Psychologist, prison and probation services. She lives at home with husband Butch. She has two children from a prior  marriage. Son [name], age 74, works as a Games developer in [place], [place]. Son [name], age 35, works as a Programme researcher, broadcasting/film/video in [place], [place]. The patient has 4 grandchildren and 4 great-grandchildren. She attends [employer].    ADVANCED DIRECTIVES: In the absence of any documentation to the contrary, the patient's spouse is their HCPOA.    HEALTH MAINTENANCE: Social History   Tobacco Use   Smoking status: Never Smoker   Smokeless tobacco: Never Used  Substance Use Topics   Alcohol use: No   Drug use: No     Colonoscopy: 09/2013, with CT virtual 11/2013  PAP: none on file, s/p hysterectomy  Bone density: 04/2001, -0.5   Allergies  Allergen Reactions   Sulfa Antibiotics Rash   Penicillins Hives    Current Outpatient Medications  Medication Sig Dispense Refill   aspirin EC 81 MG tablet Take 81 mg by mouth daily.     atorvastatin (LIPITOR) 20 MG tablet Take 20 mg by mouth daily.     Cholecalciferol (  VITAMIN D) 2000 UNITS tablet Take 2,000 Units by mouth daily.     co-enzyme Q-10 30 MG capsule Take 20 mg by mouth 3 (three) times daily.     esomeprazole (NEXIUM) 20 MG capsule Take 20 mg by mouth daily at 12 noon.     famotidine (PEPCID) 20 MG tablet Take 40 mg by mouth 2 (two) times daily.     FIBER PO Take 1 tablet by mouth daily.     fluconazole (DIFLUCAN) 200 MG tablet Take 1 tablet (200 mg total) by mouth daily. 30 tablet 0   HYDROcodone-acetaminophen (NORCO/VICODIN) 5-325 MG tablet Take 1 tablet by mouth every 6 (six) hours as needed for moderate pain. 15 tablet 0   levothyroxine (SYNTHROID) 50 MCG tablet Take 50 mcg by mouth daily.     lidocaine-prilocaine (EMLA) cream Apply 1 application topically as needed. 30 g 0   loratadine (CLARITIN) 10 MG tablet Take 1 tablet (10 mg total) by mouth daily. 60 tablet 1   LORazepam (ATIVAN) 0.5 MG tablet TAKE 1 TABLET(0.5 MG) BY MOUTH AT BEDTIME AS NEEDED FOR NAUSEA OR VOMITING 30 tablet 0   Multiple Vitamin  (MULTIVITAMIN WITH MINERALS) TABS tablet Take 1 tablet by mouth daily.     nabumetone (RELAFEN) 500 MG tablet Take 500 mg by mouth 2 (two) times daily as needed.     Omega-3 Fatty Acids (FISH OIL) 1200 MG CAPS Take 1,200 mg by mouth daily.     prochlorperazine (COMPAZINE) 10 MG tablet Take 1 tablet (10 mg total) by mouth every 6 (six) hours as needed (Nausea or vomiting). 30 tablet 1   valACYclovir (VALTREX) 500 MG tablet Take 1 tablet (500 mg total) by mouth 2 (two) times daily. 180 tablet 0   No current facility-administered medications for this visit.    OBJECTIVE: White woman who appears stated age 47:   12/03/19 0912  BP: 129/62  Pulse: 91  Resp: 18  Temp: 98.3 F (36.8 C)  SpO2: 98%     Body mass index is 36.35 kg/m.   Wt Readings from Last 3 Encounters:  12/03/19 186 lb 1.6 oz (84.4 kg)  11/26/19 188 lb 6.4 oz (85.5 kg)  11/19/19 187 lb 14.4 oz (85.2 kg)  ECOG FS:1 - Symptomatic but completely ambulatory  Sclerae unicteric, EOMs intact Wearing a mask No cervical or supraclavicular adenopathy Lungs no rales or rhonchi Heart regular rate and rhythm Abd soft, obese, nontender, positive bowel sounds MSK no focal spinal tenderness, no upper extremity lymphedema Neuro: nonfocal, well oriented, appropriate affect Breasts: Deferred   LAB RESULTS:  CMP     Component Value Date/Time   NA 144 11/26/2019 0900   K 3.9 11/26/2019 0900   CL 112 (H) 11/26/2019 0900   CO2 25 11/26/2019 0900   GLUCOSE 116 (H) 11/26/2019 0900   BUN 13 11/26/2019 0900   CREATININE 0.74 11/26/2019 0900   CREATININE 0.94 07/17/2019 0823   CALCIUM 8.8 (L) 11/26/2019 0900   PROT 6.1 (L) 11/26/2019 0900   ALBUMIN 3.5 11/26/2019 0900   AST 14 (L) 11/26/2019 0900   AST 21 07/17/2019 0823   ALT 12 11/26/2019 0900   ALT 18 07/17/2019 0823   ALKPHOS 111 11/26/2019 0900   BILITOT 0.2 (L) 11/26/2019 0900   BILITOT 0.4 07/17/2019 0823   GFRNONAA >60 11/26/2019 0900   GFRNONAA >60 07/17/2019  0823   GFRAA >60 11/26/2019 0900   GFRAA >60 07/17/2019 0823    No results found for: TOTALPROTELP, ALBUMINELP, A1GS,  A2GS, BETS, BETA2SER, GAMS, MSPIKE, SPEI  Lab Results  Component Value Date   WBC 2.8 (L) 12/03/2019   NEUTROABS 1.9 12/03/2019   HGB 9.6 (L) 12/03/2019   HCT 29.8 (L) 12/03/2019   MCV 107.2 (H) 12/03/2019   PLT 202 12/03/2019    No results found for: LABCA2  No components found for: [id number]  No results for input(s): INR in the last 168 hours.  No results found for: LABCA2  No results found for: MGN003  No results found for: BCW888  No results found for: BVQ945  No results found for: CA2729  No components found for: HGQUANT  No results found for: CEA1 / No results found for: CEA1   No results found for: AFPTUMOR  No results found for: CHROMOGRNA  No results found for: KPAFRELGTCHN, LAMBDASER, KAPLAMBRATIO (kappa/lambda light chains)  No results found for: HGBA, HGBA2QUANT, HGBFQUANT, HGBSQUAN (Hemoglobinopathy evaluation)   No results found for: LDH  No results found for: IRON, TIBC, IRONPCTSAT (Iron and TIBC)  No results found for: FERRITIN  Urinalysis No results found for: COLORURINE, APPEARANCEUR, LABSPEC, PHURINE, GLUCOSEU, HGBUR, BILIRUBINUR, KETONESUR, PROTEINUR, UROBILINOGEN, NITRITE, LEUKOCYTESUR   STUDIES: MR BREAST BILATERAL W WO CONTRAST INC CAD  Result Date: 11/18/2019 CLINICAL DATA:  Follow-up known left breast cancer. The patient has undergone neoadjuvant therapy. LABS:  None EXAM: BILATERAL BREAST MRI WITH AND WITHOUT CONTRAST TECHNIQUE: Multiplanar, multisequence MR images of both breasts were obtained prior to and following the intravenous administration of 10 ml of Gadavist Three-dimensional MR images were rendered by post-processing of the original MR data on an independent workstation. The three-dimensional MR images were interpreted, and findings are reported in the following complete MRI report for this study. Three  dimensional images were evaluated at the independent DynaCad workstation COMPARISON:  July 25, 2019 FINDINGS: Breast composition: c. Heterogeneous fibroglandular tissue. Background parenchymal enhancement: Mild Right breast: No mass or abnormal enhancement. Left breast: There has been significant interval improvement. The patient's malignancy was dumbbell-shaped with both an anterior and posterior portion. There is no enhancement in the region of the posterior portion of the known malignancy. There is peripheral enhancement around the anterior portion of the known malignancy measuring 1.5 by 1.2 by 1.8 cm in the AP, transverse, and craniocaudal dimensions. The remaining enhancement abuts the skin inferiorly with possible dimpling seen on coronal images. Lymph nodes: No abnormal appearing lymph nodes. Ancillary findings:  None. IMPRESSION: 1. No abnormalities identified in the right breast. 2. The patient's biopsied malignancy in the left breast was dumbbell shaped with anterior and posterior portions. There is no abnormal enhancement associated with the posterior portion. There is peripheral ring-like enhancement around the anterior portion measuring 1.5 x 1.2 x 1.8 cm, extending to the skin overlying the inferior aspect of the mass with slight skin dimpling suggested on coronal imaging. Skin involvement cannot be definitively determined on these images. 3. No adenopathy. RECOMMENDATION: Recommend continued surgical and oncologic follow up. BI-RADS CATEGORY  6: Known biopsy-proven malignancy. Electronically Signed   By: Dorise Bullion III M.D   On: 11/18/2019 16:06   NM PET Image Restag (PS) Skull Base To Thigh  Result Date: 11/21/2019 CLINICAL DATA:  Subsequent treatment strategy for breast cancer. EXAM: NUCLEAR MEDICINE PET SKULL BASE TO THIGH TECHNIQUE: 9.3 mCi F-18 FDG was injected intravenously. Full-ring PET imaging was performed from the skull base to thigh after the radiotracer. CT data was obtained  and used for attenuation correction and anatomic localization. Fasting blood glucose: 84 mg/dl COMPARISON:  None. FINDINGS: Mediastinal blood pool activity: SUV max 2.3 Liver activity: SUV max NA NECK: No hypermetabolic lymph nodes in the neck. Incidental CT findings: none CHEST: No hypermetabolic mediastinal or hilar nodes. No hypermetabolic axillary lymphadenopathy. No suspicious pulmonary nodules on the CT scan. 12 mm nodule inferior left breast shows low level FDG uptake with SUV max = 2.7. Incidental CT findings: Heart size upper normal. Right Port-A-Cath tip is positioned in the mid right atrium. Atherosclerotic calcification is noted in the wall of the thoracic aorta. ABDOMEN/PELVIS: No abnormal hypermetabolic activity within the liver, pancreas, adrenal glands, or spleen. No hypermetabolic lymph nodes in the abdomen or pelvis. Incidental CT findings: There is abdominal aortic atherosclerosis without aneurysm. SKELETON: No focal hypermetabolic activity to suggest skeletal metastasis. Incidental CT findings: No worrisome lytic or sclerotic osseous abnormality. IMPRESSION: 1. Inferior left breast nodule shows low level FDG uptake, compatible with primary neoplasm. 2. No evidence for hypermetabolic metastatic disease in the neck, chest, abdomen, or pelvis. 3.  Aortic Atherosclerois (ICD10-170.0) Electronically Signed   By: Misty Stanley M.D.   On: 11/21/2019 11:05     ELIGIBLE FOR AVAILABLE RESEARCH PROTOCOL: no  ASSESSMENT: 74 y.o. [place] woman status post left breast lower outer quadrant biopsy 07/09/2019 for a clinical T2 N1, stage IIIB invasive ductal carcinoma, grade 3, triple negative, with an MIB-1 of 60%  (a) chest CT scan and bone scan 07/31/2019 showed no evidence of metastatic disease  (b) MRI biopsy of 2 additional areas in the left breast (central, 07/10/2019, and at 8:30 o'clock on 07/26/2019) are both benign and concordant  (c) biopsy of 2 suspicious areas in the right breast on  07/26/2019 (9:00 and lower outer quadrant) were benign and concordant  (d) PET scan 11/21/2019 shows a 1.2 cm nodule in the left breast with an SUV max of 2.7, aortic calcification, but no lung liver or bone lesions, no evidence of metastases   (1) neoadjuvant chemotherapy consisting of cyclophosphamide and doxorubicin in dose dense fashion x4 started 07/30/2019, completed 09/10/2019, followed by weekly carboplatin and paclitaxel x12 started 09/24/2019  (a) cycle 6 delayed 2 weeks because of anemia and thrombocytopenia, neutropenia  (2) definitive surgery to follow with targeted axillary lymph node dissection  (3) adjuvant radiation to follow  (4) genetics testing 07/23/2019 through the STAT Breast cancer panel offered by [facility] found no deleterious mutations in ATM, BRCA1, BRCA2, CDH1, CHEK2, PALB2, PTEN, STK11 and TP53.  Additional testing through the Common Hereditary Cancers Panel offered by [facility] also found no deleterious mutations in IPC, ATM, AXIN2, BARD1, BMPR1A, BRCA1, BRCA2, BRIP1, CDH1, CDK4, CDKN2A (p14ARF), CDKN2A (p16INK4a), CHEK2, CTNNA1, DICER1, EPCAM (Deletion/duplication testing only), GREM1 (promoter region deletion/duplication testing only), KIT, MEN1, MLH1, MSH2, MSH3, MSH6, MUTYH, NBN, NF1, NHTL1, PALB2, PDGFRA, PMS2, POLD1, POLE, PTEN, RAD50, RAD51C, RAD51D, RNF43, SDHB, SDHC, SDHD, SMAD4, SMARCA4. STK11, TP53, TSC1, TSC2, and VHL.  The following genes were evaluated for sequence changes only: SDHA and HOXB13 c.251G>A variant only.   (a) 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    PLAN: Cloteal will proceed to her ninth of 12 planned cycles of paclitaxel today.  She has done very well with the treatments and has no peripheral neuropathy or other symptoms of concern at this point.  Her bone marrow of course is lagging.  She probably can get away with 2 Granix doses instead of 3--her ANC today is adequate even though her total white  cell count is less than 3.0.  The shots do make her feel "fluey" for a couple of days.  The alternative would be to give her a week off here and there but she really wants to get the treatments completed as scheduled and I do not blame her  I will see her with each subsequent cycle until she gets done with her treatments  Total encounter time 25 minutes.Sarajane Jews C. Traniyah Hallett, MD 12/03/19 9:28 AM Medical Oncology and Hematology [facility] [place], [place] [address] Tel. [phone]    Fax. [phone]   I, Wilburn Mylar, am acting as scribe for Dr. Virgie Dad. Adhya Cocco.  I, Lurline Del MD, have reviewed the above documentation for accuracy and completeness, and I agree with the above.   *Total Encounter Time as defined by the Centers for Medicare and Medicaid Services includes, in addition to the face-to-face time of a patient visit (documented in the note above) non-face-to-face time: obtaining and reviewing outside history, ordering and reviewing medications, tests or procedures, care coordination (communications with other health care professionals or caregivers) and documentation in the medical record.

## 2019-12-03 ENCOUNTER — Other Ambulatory Visit: Payer: Self-pay

## 2019-12-03 ENCOUNTER — Inpatient Hospital Stay (HOSPITAL_BASED_OUTPATIENT_CLINIC_OR_DEPARTMENT_OTHER): Payer: Medicare Other | Admitting: Oncology

## 2019-12-03 ENCOUNTER — Inpatient Hospital Stay: Payer: Medicare Other | Attending: Oncology

## 2019-12-03 ENCOUNTER — Inpatient Hospital Stay: Payer: Medicare Other

## 2019-12-03 VITALS — BP 129/62 | HR 91 | Temp 98.3°F | Resp 18 | Ht 60.0 in | Wt 186.1 lb

## 2019-12-03 DIAGNOSIS — Z833 Family history of diabetes mellitus: Secondary | ICD-10-CM | POA: Insufficient documentation

## 2019-12-03 DIAGNOSIS — Z5111 Encounter for antineoplastic chemotherapy: Secondary | ICD-10-CM | POA: Insufficient documentation

## 2019-12-03 DIAGNOSIS — Z171 Estrogen receptor negative status [ER-]: Secondary | ICD-10-CM | POA: Insufficient documentation

## 2019-12-03 DIAGNOSIS — Z5189 Encounter for other specified aftercare: Secondary | ICD-10-CM | POA: Insufficient documentation

## 2019-12-03 DIAGNOSIS — Z7982 Long term (current) use of aspirin: Secondary | ICD-10-CM | POA: Diagnosis not present

## 2019-12-03 DIAGNOSIS — Z800 Family history of malignant neoplasm of digestive organs: Secondary | ICD-10-CM | POA: Insufficient documentation

## 2019-12-03 DIAGNOSIS — C50512 Malignant neoplasm of lower-outer quadrant of left female breast: Secondary | ICD-10-CM | POA: Diagnosis not present

## 2019-12-03 DIAGNOSIS — Z8249 Family history of ischemic heart disease and other diseases of the circulatory system: Secondary | ICD-10-CM | POA: Diagnosis not present

## 2019-12-03 DIAGNOSIS — J45909 Unspecified asthma, uncomplicated: Secondary | ICD-10-CM | POA: Diagnosis not present

## 2019-12-03 DIAGNOSIS — Z806 Family history of leukemia: Secondary | ICD-10-CM | POA: Diagnosis not present

## 2019-12-03 DIAGNOSIS — Z79899 Other long term (current) drug therapy: Secondary | ICD-10-CM | POA: Insufficient documentation

## 2019-12-03 DIAGNOSIS — Z8041 Family history of malignant neoplasm of ovary: Secondary | ICD-10-CM | POA: Insufficient documentation

## 2019-12-03 DIAGNOSIS — Z803 Family history of malignant neoplasm of breast: Secondary | ICD-10-CM | POA: Insufficient documentation

## 2019-12-03 DIAGNOSIS — D701 Agranulocytosis secondary to cancer chemotherapy: Secondary | ICD-10-CM | POA: Insufficient documentation

## 2019-12-03 DIAGNOSIS — C773 Secondary and unspecified malignant neoplasm of axilla and upper limb lymph nodes: Secondary | ICD-10-CM | POA: Diagnosis not present

## 2019-12-03 DIAGNOSIS — E785 Hyperlipidemia, unspecified: Secondary | ICD-10-CM | POA: Insufficient documentation

## 2019-12-03 DIAGNOSIS — Z8051 Family history of malignant neoplasm of kidney: Secondary | ICD-10-CM | POA: Diagnosis not present

## 2019-12-03 DIAGNOSIS — E039 Hypothyroidism, unspecified: Secondary | ICD-10-CM | POA: Diagnosis not present

## 2019-12-03 DIAGNOSIS — K589 Irritable bowel syndrome without diarrhea: Secondary | ICD-10-CM | POA: Diagnosis not present

## 2019-12-03 LAB — CBC WITH DIFFERENTIAL/PLATELET
Abs Immature Granulocytes: 0.03 10*3/uL (ref 0.00–0.07)
Basophils Absolute: 0 10*3/uL (ref 0.0–0.1)
Basophils Relative: 1 %
Eosinophils Absolute: 0 10*3/uL (ref 0.0–0.5)
Eosinophils Relative: 0 %
HCT: 29.8 % — ABNORMAL LOW (ref 36.0–46.0)
Hemoglobin: 9.6 g/dL — ABNORMAL LOW (ref 12.0–15.0)
Immature Granulocytes: 1 %
Lymphocytes Relative: 14 %
Lymphs Abs: 0.4 10*3/uL — ABNORMAL LOW (ref 0.7–4.0)
MCH: 34.5 pg — ABNORMAL HIGH (ref 26.0–34.0)
MCHC: 32.2 g/dL (ref 30.0–36.0)
MCV: 107.2 fL — ABNORMAL HIGH (ref 80.0–100.0)
Monocytes Absolute: 0.5 10*3/uL (ref 0.1–1.0)
Monocytes Relative: 16 %
Neutro Abs: 1.9 10*3/uL (ref 1.7–7.7)
Neutrophils Relative %: 68 %
Platelets: 202 10*3/uL (ref 150–400)
RBC: 2.78 MIL/uL — ABNORMAL LOW (ref 3.87–5.11)
RDW: 17.5 % — ABNORMAL HIGH (ref 11.5–15.5)
WBC: 2.8 10*3/uL — ABNORMAL LOW (ref 4.0–10.5)
nRBC: 0.7 % — ABNORMAL HIGH (ref 0.0–0.2)

## 2019-12-03 LAB — COMPREHENSIVE METABOLIC PANEL
ALT: 14 U/L (ref 0–44)
AST: 17 U/L (ref 15–41)
Albumin: 3.6 g/dL (ref 3.5–5.0)
Alkaline Phosphatase: 99 U/L (ref 38–126)
Anion gap: 11 (ref 5–15)
BUN: 13 mg/dL (ref 8–23)
CO2: 22 mmol/L (ref 22–32)
Calcium: 9.2 mg/dL (ref 8.9–10.3)
Chloride: 110 mmol/L (ref 98–111)
Creatinine, Ser: 0.78 mg/dL (ref 0.44–1.00)
GFR calc Af Amer: >60 mL/min (ref >60)
GFR calc non Af Amer: >60 mL/min (ref >60)
Glucose, Bld: 115 mg/dL — ABNORMAL HIGH (ref 70–99)
Potassium: 4.1 mmol/L (ref 3.5–5.1)
Sodium: 143 mmol/L (ref 135–145)
Total Bilirubin: 0.3 mg/dL (ref 0.3–1.2)
Total Protein: 6.3 g/dL — ABNORMAL LOW (ref 6.5–8.1)

## 2019-12-03 MED ORDER — FAMOTIDINE IN NACL 20-0.9 MG/50ML-% IV SOLN
INTRAVENOUS | Status: AC
  Filled 2019-12-03: qty 50

## 2019-12-03 MED ORDER — FAMOTIDINE IN NACL 20-0.9 MG/50ML-% IV SOLN
20.0000 mg | Freq: Once | INTRAVENOUS | Status: AC
  Administered 2019-12-03: 20 mg via INTRAVENOUS

## 2019-12-03 MED ORDER — SODIUM CHLORIDE 0.9 % IV SOLN
Freq: Once | INTRAVENOUS | Status: AC
  Filled 2019-12-03: qty 250

## 2019-12-03 MED ORDER — DIPHENHYDRAMINE HCL 50 MG/ML IJ SOLN
INTRAMUSCULAR | Status: AC
  Filled 2019-12-03: qty 1

## 2019-12-03 MED ORDER — PALONOSETRON HCL INJECTION 0.25 MG/5ML
INTRAVENOUS | Status: AC
  Filled 2019-12-03: qty 5

## 2019-12-03 MED ORDER — DIPHENHYDRAMINE HCL 50 MG/ML IJ SOLN
50.0000 mg | Freq: Once | INTRAMUSCULAR | Status: AC
  Administered 2019-12-03: 50 mg via INTRAVENOUS

## 2019-12-03 MED ORDER — PALONOSETRON HCL INJECTION 0.25 MG/5ML
0.2500 mg | Freq: Once | INTRAVENOUS | Status: AC
  Administered 2019-12-03: 0.25 mg via INTRAVENOUS

## 2019-12-03 MED ORDER — SODIUM CHLORIDE 0.9 % IV SOLN
150.0000 mg | Freq: Once | INTRAVENOUS | Status: AC
  Administered 2019-12-03: 150 mg via INTRAVENOUS
  Filled 2019-12-03: qty 150

## 2019-12-03 MED ORDER — SODIUM CHLORIDE 0.9 % IV SOLN
182.8000 mg | Freq: Once | INTRAVENOUS | Status: AC
  Administered 2019-12-03: 180 mg via INTRAVENOUS
  Filled 2019-12-03: qty 18

## 2019-12-03 MED ORDER — SODIUM CHLORIDE 0.9 % IV SOLN
10.0000 mg | Freq: Once | INTRAVENOUS | Status: AC
  Administered 2019-12-03: 10 mg via INTRAVENOUS
  Filled 2019-12-03: qty 10

## 2019-12-03 MED ORDER — SODIUM CHLORIDE 0.9% FLUSH
10.0000 mL | INTRAVENOUS | Status: DC | PRN
  Administered 2019-12-03: 10 mL
  Filled 2019-12-03: qty 10

## 2019-12-03 MED ORDER — HEPARIN SOD (PORK) LOCK FLUSH 100 UNIT/ML IV SOLN
500.0000 [IU] | Freq: Once | INTRAVENOUS | Status: AC | PRN
  Administered 2019-12-03: 500 [IU]
  Filled 2019-12-03: qty 5

## 2019-12-03 MED ORDER — SODIUM CHLORIDE 0.9 % IV SOLN
80.0000 mg/m2 | Freq: Once | INTRAVENOUS | Status: AC
  Administered 2019-12-03: 150 mg via INTRAVENOUS
  Filled 2019-12-03: qty 25

## 2019-12-03 NOTE — Patient Instructions (Signed)

## 2019-12-03 NOTE — Patient Instructions (Signed)
[facility] Discharge Instructions for Patients Receiving Chemotherapy  Today you received the following chemotherapy agents Paclitaxel (TAXOL) & Carboplatin (PARAPLATIN).  To help prevent nausea and vomiting after your treatment, we encourage you to take your nausea medication as prescribed.  If you develop nausea and vomiting that is not controlled by your nausea medication, call the clinic.   BELOW ARE SYMPTOMS THAT SHOULD BE REPORTED IMMEDIATELY:  *FEVER GREATER THAN 100.5 F  *CHILLS WITH OR WITHOUT FEVER  NAUSEA AND VOMITING THAT IS NOT CONTROLLED WITH YOUR NAUSEA MEDICATION  *UNUSUAL SHORTNESS OF BREATH  *UNUSUAL BRUISING OR BLEEDING  TENDERNESS IN MOUTH AND THROAT WITH OR WITHOUT PRESENCE OF ULCERS  *URINARY PROBLEMS  *BOWEL PROBLEMS  UNUSUAL RASH Items with * indicate a potential emergency and should be followed up as soon as possible.  Feel free to call the clinic should you have any questions or concerns. The clinic phone number is [phone].  Please show the CHEMO ALERT CARD at check-in to the Emergency Department and triage nurse.   

## 2019-12-05 ENCOUNTER — Other Ambulatory Visit: Payer: Self-pay

## 2019-12-05 ENCOUNTER — Inpatient Hospital Stay: Payer: Medicare Other

## 2019-12-05 VITALS — BP 130/68 | HR 85 | Temp 98.6°F | Resp 18

## 2019-12-05 DIAGNOSIS — E785 Hyperlipidemia, unspecified: Secondary | ICD-10-CM | POA: Diagnosis not present

## 2019-12-05 DIAGNOSIS — Z171 Estrogen receptor negative status [ER-]: Secondary | ICD-10-CM | POA: Diagnosis not present

## 2019-12-05 DIAGNOSIS — Z806 Family history of leukemia: Secondary | ICD-10-CM | POA: Diagnosis not present

## 2019-12-05 DIAGNOSIS — Z5111 Encounter for antineoplastic chemotherapy: Secondary | ICD-10-CM | POA: Diagnosis not present

## 2019-12-05 DIAGNOSIS — E039 Hypothyroidism, unspecified: Secondary | ICD-10-CM | POA: Diagnosis not present

## 2019-12-05 DIAGNOSIS — Z79899 Other long term (current) drug therapy: Secondary | ICD-10-CM | POA: Diagnosis not present

## 2019-12-05 DIAGNOSIS — Z8051 Family history of malignant neoplasm of kidney: Secondary | ICD-10-CM | POA: Diagnosis not present

## 2019-12-05 DIAGNOSIS — Z5189 Encounter for other specified aftercare: Secondary | ICD-10-CM | POA: Diagnosis not present

## 2019-12-05 DIAGNOSIS — Z7982 Long term (current) use of aspirin: Secondary | ICD-10-CM | POA: Diagnosis not present

## 2019-12-05 DIAGNOSIS — Z803 Family history of malignant neoplasm of breast: Secondary | ICD-10-CM | POA: Diagnosis not present

## 2019-12-05 DIAGNOSIS — C773 Secondary and unspecified malignant neoplasm of axilla and upper limb lymph nodes: Secondary | ICD-10-CM | POA: Diagnosis not present

## 2019-12-05 DIAGNOSIS — Z95828 Presence of other vascular implants and grafts: Secondary | ICD-10-CM

## 2019-12-05 DIAGNOSIS — D701 Agranulocytosis secondary to cancer chemotherapy: Secondary | ICD-10-CM | POA: Diagnosis not present

## 2019-12-05 DIAGNOSIS — J45909 Unspecified asthma, uncomplicated: Secondary | ICD-10-CM | POA: Diagnosis not present

## 2019-12-05 DIAGNOSIS — Z800 Family history of malignant neoplasm of digestive organs: Secondary | ICD-10-CM | POA: Diagnosis not present

## 2019-12-05 DIAGNOSIS — C50512 Malignant neoplasm of lower-outer quadrant of left female breast: Secondary | ICD-10-CM | POA: Diagnosis not present

## 2019-12-05 DIAGNOSIS — K589 Irritable bowel syndrome without diarrhea: Secondary | ICD-10-CM | POA: Diagnosis not present

## 2019-12-05 MED ORDER — FILGRASTIM-SNDZ 480 MCG/0.8ML IJ SOSY
480.0000 ug | PREFILLED_SYRINGE | Freq: Once | INTRAMUSCULAR | Status: AC
  Administered 2019-12-05: 480 ug via SUBCUTANEOUS

## 2019-12-05 MED ORDER — FILGRASTIM-SNDZ 480 MCG/0.8ML IJ SOSY
PREFILLED_SYRINGE | INTRAMUSCULAR | Status: AC
  Filled 2019-12-05: qty 0.8

## 2019-12-05 NOTE — Patient Instructions (Signed)

## 2019-12-06 ENCOUNTER — Other Ambulatory Visit: Payer: Self-pay

## 2019-12-06 ENCOUNTER — Inpatient Hospital Stay: Payer: Medicare Other

## 2019-12-06 VITALS — BP 117/80 | HR 96 | Temp 98.2°F | Resp 20

## 2019-12-06 DIAGNOSIS — Z79899 Other long term (current) drug therapy: Secondary | ICD-10-CM | POA: Diagnosis not present

## 2019-12-06 DIAGNOSIS — Z7982 Long term (current) use of aspirin: Secondary | ICD-10-CM | POA: Diagnosis not present

## 2019-12-06 DIAGNOSIS — Z800 Family history of malignant neoplasm of digestive organs: Secondary | ICD-10-CM | POA: Diagnosis not present

## 2019-12-06 DIAGNOSIS — Z5111 Encounter for antineoplastic chemotherapy: Secondary | ICD-10-CM | POA: Diagnosis not present

## 2019-12-06 DIAGNOSIS — E785 Hyperlipidemia, unspecified: Secondary | ICD-10-CM | POA: Diagnosis not present

## 2019-12-06 DIAGNOSIS — E039 Hypothyroidism, unspecified: Secondary | ICD-10-CM | POA: Diagnosis not present

## 2019-12-06 DIAGNOSIS — Z803 Family history of malignant neoplasm of breast: Secondary | ICD-10-CM | POA: Diagnosis not present

## 2019-12-06 DIAGNOSIS — C773 Secondary and unspecified malignant neoplasm of axilla and upper limb lymph nodes: Secondary | ICD-10-CM | POA: Diagnosis not present

## 2019-12-06 DIAGNOSIS — Z5189 Encounter for other specified aftercare: Secondary | ICD-10-CM | POA: Diagnosis not present

## 2019-12-06 DIAGNOSIS — C50512 Malignant neoplasm of lower-outer quadrant of left female breast: Secondary | ICD-10-CM

## 2019-12-06 DIAGNOSIS — Z8051 Family history of malignant neoplasm of kidney: Secondary | ICD-10-CM | POA: Diagnosis not present

## 2019-12-06 DIAGNOSIS — D701 Agranulocytosis secondary to cancer chemotherapy: Secondary | ICD-10-CM | POA: Diagnosis not present

## 2019-12-06 DIAGNOSIS — Z171 Estrogen receptor negative status [ER-]: Secondary | ICD-10-CM | POA: Diagnosis not present

## 2019-12-06 DIAGNOSIS — J45909 Unspecified asthma, uncomplicated: Secondary | ICD-10-CM | POA: Diagnosis not present

## 2019-12-06 DIAGNOSIS — Z806 Family history of leukemia: Secondary | ICD-10-CM | POA: Diagnosis not present

## 2019-12-06 DIAGNOSIS — K589 Irritable bowel syndrome without diarrhea: Secondary | ICD-10-CM | POA: Diagnosis not present

## 2019-12-06 DIAGNOSIS — Z95828 Presence of other vascular implants and grafts: Secondary | ICD-10-CM

## 2019-12-06 MED ORDER — FILGRASTIM-SNDZ 480 MCG/0.8ML IJ SOSY
PREFILLED_SYRINGE | INTRAMUSCULAR | Status: AC
  Filled 2019-12-06: qty 0.8

## 2019-12-06 MED ORDER — FILGRASTIM-SNDZ 480 MCG/0.8ML IJ SOSY
480.0000 ug | PREFILLED_SYRINGE | Freq: Once | INTRAMUSCULAR | Status: AC
  Administered 2019-12-06: 480 ug via SUBCUTANEOUS

## 2019-12-06 NOTE — Patient Instructions (Signed)

## 2019-12-09 NOTE — Progress Notes (Signed)
[place]  [phone] [phone]     ID: Julie Thompson DOB: April 30, 1946  MR#: [id number]  JJK#:[id number]  Patient Care Team: Leeroy Cha, MD as PCP - General (Internal Medicine) Rockwell Germany, RN as Oncology Nurse Navigator Mauro Kaufmann, RN as Oncology Nurse Navigator Eppie Gibson, MD as Attending Physician (Radiation Oncology) Donnie Mesa, MD as Consulting Physician (General Surgery) Breindy Meadow, Virgie Dad, MD as Consulting Physician (Oncology) Melrose Nakayama, MD as Consulting Physician (Orthopedic Surgery) Chauncey Cruel, MD OTHER MD:  CHIEF COMPLAINT: triple negative breast cancer  CURRENT TREATMENT: Neoadjuvant chemotherapy; granix   INTERVAL HISTORY: [name] returns today for follow up and treatment of her triple negative breast cancer.   She continues on weekly Paclitaxel and carboplatin.  Today she will receive her 10th of 12 planned doses. She is tolerating this well aside from the low white cell count issues.    She has a little bit of numbness in the pinky of her right hand and she says she may be developing a little bit in the pinky of her left hand.  She has nothing else on the rest of either hand or either foot.  She receives two doses of Granix after each treatment because of a week's delay after treatment #5 t due to neutropenia causing a delay in cycle 6.  This has allowed her to receive her remaining treatments on time   REVIEW OF SYSTEMS: Julie Thompson has had no nausea, no vomiting, no pain, no weakness, no change in bowel habits, no fever, no rash and no bleeding.  Currently she is very concerned about her dad who of course is elderly, has cellulitis, pain, and is needing quite a bit of support.  Despite this she is able to do all her housework and takes few walks when she can to keep her self fit.    HISTORY OF CURRENT ILLNESS: From the original intake note:  Julie Thompson had routine screening  mammography on 06/21/2019 showing a possible abnormality in the left breast. She underwent left diagnostic mammography with tomography and left breast ultrasonography at [facility] on 07/01/2019 showing: breast density category C; either 2 adjacent masses or 1 complex dumbbell-shaped mass containing calcifications in the left breast at 6 o'clock, the largest of which measures 2.1 cm; chest wall invasion not excluded; 1-2 mm group of calcifications just anterior to the mass(es); single abnormal lymph node in the left axilla; vascular calcifications in anterior left breast.  Accordingly on 07/09/2019 she proceeded to biopsy of the left breast areas in question. The pathology from this procedure ([id number]) showed:  1. Left Breast, 5:30, posterior  - invasive ductal carcinoma with necrosis, grade 3  - Prognostic indicators significant for: estrogen receptor, 0% negative and progesterone receptor, 0% negative. Proliferation marker Ki67 at 60%. HER2 negative by immunohistochemistry (0). 2. Left Breast, 5:30, posterior  - invasive ductal carcinoma with necrosis, grade 3  - ductal carcinoma in situ, intermediate grade 3. Left Axilla, lymph node (1)  - metastatic carcinoma involving a lymph node  And additional biopsy of the anterior vascular calcifications in the left breast was performed on 07/10/2019. Pathology [id number]) showed: fibrocystic changes with calcifications; no malignancy identified.  This was felt to be concordant.  The patient's subsequent history is as detailed below.   PAST MEDICAL HISTORY: Past Medical History:  Diagnosis Date  . Asthma    no problems now  . Family history of basal cell carcinoma   . Family history of  breast cancer   . Family history of colon cancer   . Family history of kidney cancer   . Family history of leukemia   . Family history of peritoneal cancer   . GERD (gastroesophageal reflux disease)   . H/O hiatal hernia   . Hyperlipidemia   .  Hypothyroidism   . IBS (irritable bowel syndrome)   . lt breast ca dx'd 07/10/19    PAST SURGICAL HISTORY: Past Surgical History:  Procedure Laterality Date  . ABDOMINAL HYSTERECTOMY     age 74  . BREAST SURGERY    . DILATION AND CURETTAGE OF UTERUS     2 miscarriages age 74 & 25  . FLEXIBLE SIGMOIDOSCOPY N/A 10/22/2013   Procedure: FLEXIBLE SIGMOIDOSCOPY;  Surgeon: Garlan Fair, MD;  Location: [facility];  Service: Endoscopy;  Laterality: N/A;  . KNEE ARTHROSCOPY Left   . PORTACATH PLACEMENT Right 07/29/2019   Procedure: INSERTION PORT-A-CATH WITH ULTRASOUND;  Surgeon: Donnie Mesa, MD;  Location: [facility];  Service: General;  Laterality: Right;  . TONSILLECTOMY     age 74    FAMILY HISTORY: Family History  Problem Relation Age of Onset  . Breast cancer Maternal Aunt        dx. in her 38s  . Cancer Mother 104       peritoneal cancer, death certificate lists ovarian cancer  . Colon cancer Paternal Uncle        dx. late 25s or early 64s  . Basal cell carcinoma Sister 7       a second diagnosed in her 86s  . Kidney cancer Maternal Grandmother 53  . Diabetes Paternal Grandmother   . Heart Problems Paternal Grandmother   . Leukemia Paternal Uncle        dx. early 92s   Patient's father is currently living at age 74 as of 08-06-19. Patient's mother died from ovarian cancer at age 40. She was diagnosed at age 74. The patient reports breast cancer in a maternal aunt, diagnosed in her 68's. The patient also noted colon cancer in a paternal uncle and kidney cancer in her maternal grandmother.  She has 1 sister.   GYNECOLOGIC HISTORY:  No LMP recorded. Patient has had a hysterectomy. Menarche: 83.75 years old Age at first live birth: 74 years old [place] P 2 LMP age 32 (with hysterectomy) Contraceptive: never used HRT used for 10-12 years  Hysterectomy? Yes, age 68 BSO? 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  Husband Julie Thompson "Butch" Laurann Montana Sr. is a retired Psychologist, prison and probation services. She lives at home with husband Butch. She has two children from a prior marriage. Son [name], age 82, works as a Games developer in [place], [place]. Son [name], age 86, works as a Programme researcher, broadcasting/film/video in [place], [place]. The patient has 4 grandchildren and 4 great-grandchildren. She attends [employer].    ADVANCED DIRECTIVES: In the absence of any documentation to the contrary, the patient's spouse is their HCPOA.    HEALTH MAINTENANCE: Social History   Tobacco Use  . Smoking status: Never Smoker  . Smokeless tobacco: Never Used  Substance Use Topics  . Alcohol use: No  . Drug use: No     Colonoscopy: 09/2013, with CT virtual 11/2013  PAP: none on file, s/p hysterectomy  Bone density: 04/2001, -0.5   Allergies  Allergen Reactions  . Sulfa Antibiotics Rash  . Penicillins Hives    Current Outpatient Medications  Medication Sig Dispense Refill  . aspirin EC 81 MG tablet Take 81 mg by mouth daily.    Marland Kitchen atorvastatin (LIPITOR) 20 MG tablet Take 20 mg by mouth daily.    . Cholecalciferol (VITAMIN D) 2000 UNITS tablet Take 2,000 Units by mouth daily.    Marland Kitchen co-enzyme Q-10 30 MG capsule Take 20 mg by mouth 3 (three) times daily.    Marland Kitchen esomeprazole (NEXIUM) 20 MG capsule Take 20 mg by mouth daily at 12 noon.    . famotidine (PEPCID) 20 MG tablet Take 40 mg by mouth 2 (two) times daily.    Marland Kitchen FIBER PO Take 1 tablet by mouth daily.    . fluconazole (DIFLUCAN) 200 MG tablet Take 1 tablet (200 mg total) by mouth daily. 30 tablet 0  . HYDROcodone-acetaminophen (NORCO/VICODIN) 5-325 MG tablet Take 1 tablet by mouth every 6 (six) hours as needed for moderate pain. 15 tablet 0  . levothyroxine (SYNTHROID) 50 MCG tablet Take 50 mcg by mouth daily.    Marland Kitchen lidocaine-prilocaine (EMLA) cream Apply 1 application topically as needed. 30 g 0  . loratadine (CLARITIN) 10 MG tablet Take 1 tablet (10 mg total) by mouth daily.  60 tablet 1  . LORazepam (ATIVAN) 0.5 MG tablet TAKE 1 TABLET(0.5 MG) BY MOUTH AT BEDTIME AS NEEDED FOR NAUSEA OR VOMITING 30 tablet 0  . Multiple Vitamin (MULTIVITAMIN WITH MINERALS) TABS tablet Take 1 tablet by mouth daily.    . nabumetone (RELAFEN) 500 MG tablet Take 500 mg by mouth 2 (two) times daily as needed.    . Omega-3 Fatty Acids (FISH OIL) 1200 MG CAPS Take 1,200 mg by mouth daily.    . prochlorperazine (COMPAZINE) 10 MG tablet Take 1 tablet (10 mg total) by mouth every 6 (six) hours as needed (Nausea or vomiting). 30 tablet 1  . valACYclovir (VALTREX) 500 MG tablet Take 1 tablet (500 mg total) by mouth 2 (two) times daily. 180 tablet 0   No current facility-administered medications for this visit.    OBJECTIVE: White woman in no acute distress Vitals:   12/10/19 0918  BP: (!) 141/80  Pulse: 96  Resp: 18  Temp: 98.5 F (36.9 C)  SpO2: 99%     Body mass index is 36.54 kg/m.   Wt Readings from Last 3 Encounters:  12/10/19 187 lb 1.6 oz (84.9 kg)  12/03/19 186 lb 1.6 oz (84.4 kg)  11/26/19 188 lb 6.4 oz (85.5 kg)  ECOG FS:1 - Symptomatic but completely ambulatory  Sclerae unicteric, EOMs intact Wearing a mask No cervical or supraclavicular adenopathy Lungs no rales or rhonchi Heart regular rate and rhythm Abd soft, obese, nontender, positive bowel sounds MSK no focal spinal tenderness, no upper extremity lymphedema Neuro: nonfocal, well oriented, appropriate affect Breasts: Deferred   LAB RESULTS:  CMP     Component Value Date/Time   NA 143 12/03/2019 0900   K 4.1 12/03/2019 0900   CL 110 12/03/2019 0900   CO2 22 12/03/2019 0900   GLUCOSE 115 (H) 12/03/2019 0900   BUN 13 12/03/2019 0900   CREATININE 0.78 12/03/2019 0900   CREATININE 0.94 07/17/2019 0823   CALCIUM 9.2 12/03/2019 0900   PROT 6.3 (L) 12/03/2019 0900   ALBUMIN 3.6 12/03/2019 0900   AST 17 12/03/2019 0900   AST 21 07/17/2019 0823   ALT 14 12/03/2019 0900   ALT 18 07/17/2019 0823    ALKPHOS 99 12/03/2019 0900   BILITOT 0.3 12/03/2019 0900   BILITOT 0.4 07/17/2019 1610  GFRNONAA >60 12/03/2019 0900   GFRNONAA >60 07/17/2019 0823   GFRAA >60 12/03/2019 0900   GFRAA >60 07/17/2019 0823    No results found for: TOTALPROTELP, ALBUMINELP, A1GS, A2GS, BETS, BETA2SER, GAMS, MSPIKE, SPEI  Lab Results  Component Value Date   WBC 2.7 (L) 12/10/2019   NEUTROABS 1.8 12/10/2019   HGB 9.2 (L) 12/10/2019   HCT 28.7 (L) 12/10/2019   MCV 106.7 (H) 12/10/2019   PLT 153 12/10/2019    No results found for: LABCA2  No components found for: ALPFXT024  No results for input(s): INR in the last 168 hours.  No results found for: LABCA2  No results found for: OXB353  No results found for: GDJ242  No results found for: AST419  No results found for: CA2729  No components found for: HGQUANT  No results found for: CEA1 / No results found for: CEA1   No results found for: AFPTUMOR  No results found for: CHROMOGRNA  No results found for: KPAFRELGTCHN, LAMBDASER, KAPLAMBRATIO (kappa/lambda light chains)  No results found for: HGBA, HGBA2QUANT, HGBFQUANT, HGBSQUAN (Hemoglobinopathy evaluation)   No results found for: LDH  No results found for: IRON, TIBC, IRONPCTSAT (Iron and TIBC)  No results found for: FERRITIN  Urinalysis No results found for: COLORURINE, APPEARANCEUR, LABSPEC, PHURINE, GLUCOSEU, HGBUR, BILIRUBINUR, KETONESUR, PROTEINUR, UROBILINOGEN, NITRITE, LEUKOCYTESUR   STUDIES: MR BREAST BILATERAL W WO CONTRAST INC CAD  Result Date: 11/18/2019 CLINICAL DATA:  Follow-up known left breast cancer. The patient has undergone neoadjuvant therapy. LABS:  None EXAM: BILATERAL BREAST MRI WITH AND WITHOUT CONTRAST TECHNIQUE: Multiplanar, multisequence MR images of both breasts were obtained prior to and following the intravenous administration of 10 ml of Gadavist Three-dimensional MR images were rendered by post-processing of the original MR data on an independent  workstation. The three-dimensional MR images were interpreted, and findings are reported in the following complete MRI report for this study. Three dimensional images were evaluated at the independent DynaCad workstation COMPARISON:  July 25, 2019 FINDINGS: Breast composition: c. Heterogeneous fibroglandular tissue. Background parenchymal enhancement: Mild Right breast: No mass or abnormal enhancement. Left breast: There has been significant interval improvement. The patient's malignancy was dumbbell-shaped with both an anterior and posterior portion. There is no enhancement in the region of the posterior portion of the known malignancy. There is peripheral enhancement around the anterior portion of the known malignancy measuring 1.5 by 1.2 by 1.8 cm in the AP, transverse, and craniocaudal dimensions. The remaining enhancement abuts the skin inferiorly with possible dimpling seen on coronal images. Lymph nodes: No abnormal appearing lymph nodes. Ancillary findings:  None. IMPRESSION: 1. No abnormalities identified in the right breast. 2. The patient's biopsied malignancy in the left breast was dumbbell shaped with anterior and posterior portions. There is no abnormal enhancement associated with the posterior portion. There is peripheral ring-like enhancement around the anterior portion measuring 1.5 x 1.2 x 1.8 cm, extending to the skin overlying the inferior aspect of the mass with slight skin dimpling suggested on coronal imaging. Skin involvement cannot be definitively determined on these images. 3. No adenopathy. RECOMMENDATION: Recommend continued surgical and oncologic follow up. BI-RADS CATEGORY  6: Known biopsy-proven malignancy. Electronically Signed   By: Dorise Bullion III M.D   On: 11/18/2019 16:06   NM PET Image Restag (PS) Skull Base To Thigh  Result Date: 11/21/2019 CLINICAL DATA:  Subsequent treatment strategy for breast cancer. EXAM: NUCLEAR MEDICINE PET SKULL BASE TO THIGH TECHNIQUE: 9.3  mCi F-18 FDG was injected intravenously.  Full-ring PET imaging was performed from the skull base to thigh after the radiotracer. CT data was obtained and used for attenuation correction and anatomic localization. Fasting blood glucose: 84 mg/dl COMPARISON:  None. FINDINGS: Mediastinal blood pool activity: SUV max 2.3 Liver activity: SUV max NA NECK: No hypermetabolic lymph nodes in the neck. Incidental CT findings: none CHEST: No hypermetabolic mediastinal or hilar nodes. No hypermetabolic axillary lymphadenopathy. No suspicious pulmonary nodules on the CT scan. 12 mm nodule inferior left breast shows low level FDG uptake with SUV max = 2.7. Incidental CT findings: Heart size upper normal. Right Port-A-Cath tip is positioned in the mid right atrium. Atherosclerotic calcification is noted in the wall of the thoracic aorta. ABDOMEN/PELVIS: No abnormal hypermetabolic activity within the liver, pancreas, adrenal glands, or spleen. No hypermetabolic lymph nodes in the abdomen or pelvis. Incidental CT findings: There is abdominal aortic atherosclerosis without aneurysm. SKELETON: No focal hypermetabolic activity to suggest skeletal metastasis. Incidental CT findings: No worrisome lytic or sclerotic osseous abnormality. IMPRESSION: 1. Inferior left breast nodule shows low level FDG uptake, compatible with primary neoplasm. 2. No evidence for hypermetabolic metastatic disease in the neck, chest, abdomen, or pelvis. 3.  Aortic Atherosclerois (ICD10-170.0) Electronically Signed   By: Misty Stanley M.D.   On: 11/21/2019 11:05     ELIGIBLE FOR AVAILABLE RESEARCH PROTOCOL: no  ASSESSMENT: 74 y.o. [place] woman status post left breast lower outer quadrant biopsy 07/09/2019 for a clinical T2 N1, stage IIIB invasive ductal carcinoma, grade 3, triple negative, with an MIB-1 of 60%  (a) chest CT scan and bone scan 07/31/2019 showed no evidence of metastatic disease  (b) MRI biopsy of 2 additional areas in the left breast  (central, 07/10/2019, and at 8:30 o'clock on 07/26/2019) are both benign and concordant  (c) biopsy of 2 suspicious areas in the right breast on 07/26/2019 (9:00 and lower outer quadrant) were benign and concordant  (d) PET scan 11/21/2019 shows a 1.2 cm nodule in the left breast with an SUV max of 2.7, aortic calcification, but no lung liver or bone lesions, no evidence of metastases   (1) neoadjuvant chemotherapy consisting of cyclophosphamide and doxorubicin in dose dense fashion x4 started 07/30/2019, completed 09/10/2019, followed by weekly carboplatin and paclitaxel x12 started 09/24/2019  (a) cycle 6 delayed 2 weeks because of anemia and thrombocytopenia, neutropenia  (2) definitive surgery to follow with targeted axillary lymph node dissection  (3) adjuvant radiation to follow  (4) genetics testing 07/23/2019 through the STAT Breast cancer panel offered by [facility] found no deleterious mutations in ATM, BRCA1, BRCA2, CDH1, CHEK2, PALB2, PTEN, STK11 and TP53.  Additional testing through the Common Hereditary Cancers Panel offered by [facility] also found no deleterious mutations in IPC, ATM, AXIN2, BARD1, BMPR1A, BRCA1, BRCA2, BRIP1, CDH1, CDK4, CDKN2A (p14ARF), CDKN2A (p16INK4a), CHEK2, CTNNA1, DICER1, EPCAM (Deletion/duplication testing only), GREM1 (promoter region deletion/duplication testing only), KIT, MEN1, MLH1, MSH2, MSH3, MSH6, MUTYH, NBN, NF1, NHTL1, PALB2, PDGFRA, PMS2, POLD1, POLE, PTEN, RAD50, RAD51C, RAD51D, RNF43, SDHB, SDHC, SDHD, SMAD4, SMARCA4. STK11, TP53, TSC1, TSC2, and VHL.  The following genes were evaluated for sequence changes only: SDHA and HOXB13 c.251G>A variant only.   (a) 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    PLAN: Saydi will have her 10th of 12 planned doses of paclitaxel today.  I do not think what she is feeling in both pinkies is going to be directly related to the Taxol but it could be that she  has some carpal  tunnel symptoms and that the Taxol may worsen that.  We will certainly need to watch that carefully.  Her [place] today is adequate things to the Granix she has been receiving and she of course is scheduled to receive that again after this dose  She will see me again in 1 week.  She knows to call for any other issue that may develop before then  Total encounter time 20 minutes.Sarajane Jews C. Lexa Coronado, MD 12/10/19 9:31 AM Medical Oncology and Hematology [facility] [place], [place] [address] Tel. [phone]    Fax. [phone]   I, Wilburn Mylar, am acting as scribe for Dr. Virgie Dad. Lilburn Straw.  I, Lurline Del MD, have reviewed the above documentation for accuracy and completeness, and I agree with the above.   *Total Encounter Time as defined by the Centers for Medicare and Medicaid Services includes, in addition to the face-to-face time of a patient visit (documented in the note above) non-face-to-face time: obtaining and reviewing outside history, ordering and reviewing medications, tests or procedures, care coordination (communications with other health care professionals or caregivers) and documentation in the medical record.

## 2019-12-10 ENCOUNTER — Inpatient Hospital Stay (HOSPITAL_BASED_OUTPATIENT_CLINIC_OR_DEPARTMENT_OTHER): Payer: Medicare Other | Admitting: Oncology

## 2019-12-10 ENCOUNTER — Encounter: Payer: Self-pay | Admitting: *Deleted

## 2019-12-10 ENCOUNTER — Inpatient Hospital Stay: Payer: Medicare Other

## 2019-12-10 ENCOUNTER — Other Ambulatory Visit: Payer: Self-pay

## 2019-12-10 VITALS — BP 141/80 | HR 96 | Temp 98.5°F | Resp 18 | Ht 60.0 in | Wt 187.1 lb

## 2019-12-10 DIAGNOSIS — Z171 Estrogen receptor negative status [ER-]: Secondary | ICD-10-CM

## 2019-12-10 DIAGNOSIS — Z803 Family history of malignant neoplasm of breast: Secondary | ICD-10-CM | POA: Diagnosis not present

## 2019-12-10 DIAGNOSIS — Z5111 Encounter for antineoplastic chemotherapy: Secondary | ICD-10-CM | POA: Diagnosis not present

## 2019-12-10 DIAGNOSIS — Z806 Family history of leukemia: Secondary | ICD-10-CM | POA: Diagnosis not present

## 2019-12-10 DIAGNOSIS — Z8051 Family history of malignant neoplasm of kidney: Secondary | ICD-10-CM | POA: Diagnosis not present

## 2019-12-10 DIAGNOSIS — Z79899 Other long term (current) drug therapy: Secondary | ICD-10-CM | POA: Diagnosis not present

## 2019-12-10 DIAGNOSIS — Z95828 Presence of other vascular implants and grafts: Secondary | ICD-10-CM

## 2019-12-10 DIAGNOSIS — C50512 Malignant neoplasm of lower-outer quadrant of left female breast: Secondary | ICD-10-CM

## 2019-12-10 DIAGNOSIS — Z5189 Encounter for other specified aftercare: Secondary | ICD-10-CM | POA: Diagnosis not present

## 2019-12-10 DIAGNOSIS — J45909 Unspecified asthma, uncomplicated: Secondary | ICD-10-CM | POA: Diagnosis not present

## 2019-12-10 DIAGNOSIS — D701 Agranulocytosis secondary to cancer chemotherapy: Secondary | ICD-10-CM | POA: Diagnosis not present

## 2019-12-10 DIAGNOSIS — C773 Secondary and unspecified malignant neoplasm of axilla and upper limb lymph nodes: Secondary | ICD-10-CM | POA: Diagnosis not present

## 2019-12-10 DIAGNOSIS — E039 Hypothyroidism, unspecified: Secondary | ICD-10-CM | POA: Diagnosis not present

## 2019-12-10 DIAGNOSIS — Z800 Family history of malignant neoplasm of digestive organs: Secondary | ICD-10-CM | POA: Diagnosis not present

## 2019-12-10 DIAGNOSIS — Z7982 Long term (current) use of aspirin: Secondary | ICD-10-CM | POA: Diagnosis not present

## 2019-12-10 DIAGNOSIS — K589 Irritable bowel syndrome without diarrhea: Secondary | ICD-10-CM | POA: Diagnosis not present

## 2019-12-10 DIAGNOSIS — E785 Hyperlipidemia, unspecified: Secondary | ICD-10-CM | POA: Diagnosis not present

## 2019-12-10 LAB — COMPREHENSIVE METABOLIC PANEL
ALT: 12 U/L (ref 0–44)
AST: 15 U/L (ref 15–41)
Albumin: 3.5 g/dL (ref 3.5–5.0)
Alkaline Phosphatase: 100 U/L (ref 38–126)
Anion gap: 10 (ref 5–15)
BUN: 9 mg/dL (ref 8–23)
CO2: 22 mmol/L (ref 22–32)
Calcium: 9 mg/dL (ref 8.9–10.3)
Chloride: 112 mmol/L — ABNORMAL HIGH (ref 98–111)
Creatinine, Ser: 0.8 mg/dL (ref 0.44–1.00)
GFR calc Af Amer: >60 mL/min (ref >60)
GFR calc non Af Amer: >60 mL/min (ref >60)
Glucose, Bld: 107 mg/dL — ABNORMAL HIGH (ref 70–99)
Potassium: 3.8 mmol/L (ref 3.5–5.1)
Sodium: 144 mmol/L (ref 135–145)
Total Bilirubin: 0.3 mg/dL (ref 0.3–1.2)
Total Protein: 6 g/dL — ABNORMAL LOW (ref 6.5–8.1)

## 2019-12-10 LAB — CBC WITH DIFFERENTIAL/PLATELET
Abs Immature Granulocytes: 0.02 10*3/uL (ref 0.00–0.07)
Basophils Absolute: 0 10*3/uL (ref 0.0–0.1)
Basophils Relative: 0 %
Eosinophils Absolute: 0 10*3/uL (ref 0.0–0.5)
Eosinophils Relative: 1 %
HCT: 28.7 % — ABNORMAL LOW (ref 36.0–46.0)
Hemoglobin: 9.2 g/dL — ABNORMAL LOW (ref 12.0–15.0)
Immature Granulocytes: 1 %
Lymphocytes Relative: 15 %
Lymphs Abs: 0.4 10*3/uL — ABNORMAL LOW (ref 0.7–4.0)
MCH: 34.2 pg — ABNORMAL HIGH (ref 26.0–34.0)
MCHC: 32.1 g/dL (ref 30.0–36.0)
MCV: 106.7 fL — ABNORMAL HIGH (ref 80.0–100.0)
Monocytes Absolute: 0.5 10*3/uL (ref 0.1–1.0)
Monocytes Relative: 17 %
Neutro Abs: 1.8 10*3/uL (ref 1.7–7.7)
Neutrophils Relative %: 66 %
Platelets: 153 10*3/uL (ref 150–400)
RBC: 2.69 MIL/uL — ABNORMAL LOW (ref 3.87–5.11)
RDW: 17.9 % — ABNORMAL HIGH (ref 11.5–15.5)
WBC: 2.7 10*3/uL — ABNORMAL LOW (ref 4.0–10.5)
nRBC: 1.1 % — ABNORMAL HIGH (ref 0.0–0.2)

## 2019-12-10 MED ORDER — SODIUM CHLORIDE 0.9 % IV SOLN
182.8000 mg | Freq: Once | INTRAVENOUS | Status: AC
  Administered 2019-12-10: 180 mg via INTRAVENOUS
  Filled 2019-12-10: qty 18

## 2019-12-10 MED ORDER — SODIUM CHLORIDE 0.9 % IV SOLN
150.0000 mg | Freq: Once | INTRAVENOUS | Status: AC
  Administered 2019-12-10: 150 mg via INTRAVENOUS
  Filled 2019-12-10: qty 150

## 2019-12-10 MED ORDER — SODIUM CHLORIDE 0.9 % IV SOLN
Freq: Once | INTRAVENOUS | Status: AC
  Filled 2019-12-10: qty 250

## 2019-12-10 MED ORDER — SODIUM CHLORIDE 0.9 % IV SOLN
80.0000 mg/m2 | Freq: Once | INTRAVENOUS | Status: AC
  Administered 2019-12-10: 150 mg via INTRAVENOUS
  Filled 2019-12-10: qty 25

## 2019-12-10 MED ORDER — FAMOTIDINE IN NACL 20-0.9 MG/50ML-% IV SOLN
20.0000 mg | Freq: Once | INTRAVENOUS | Status: AC
  Administered 2019-12-10: 20 mg via INTRAVENOUS

## 2019-12-10 MED ORDER — HEPARIN SOD (PORK) LOCK FLUSH 100 UNIT/ML IV SOLN
500.0000 [IU] | Freq: Once | INTRAVENOUS | Status: AC | PRN
  Administered 2019-12-10: 500 [IU]
  Filled 2019-12-10: qty 5

## 2019-12-10 MED ORDER — SODIUM CHLORIDE 0.9 % IV SOLN
10.0000 mg | Freq: Once | INTRAVENOUS | Status: AC
  Administered 2019-12-10: 10 mg via INTRAVENOUS
  Filled 2019-12-10: qty 10

## 2019-12-10 MED ORDER — DIPHENHYDRAMINE HCL 50 MG/ML IJ SOLN
INTRAMUSCULAR | Status: AC
  Filled 2019-12-10: qty 1

## 2019-12-10 MED ORDER — FAMOTIDINE IN NACL 20-0.9 MG/50ML-% IV SOLN
INTRAVENOUS | Status: AC
  Filled 2019-12-10: qty 50

## 2019-12-10 MED ORDER — PALONOSETRON HCL INJECTION 0.25 MG/5ML
INTRAVENOUS | Status: AC
  Filled 2019-12-10: qty 5

## 2019-12-10 MED ORDER — PALONOSETRON HCL INJECTION 0.25 MG/5ML
0.2500 mg | Freq: Once | INTRAVENOUS | Status: AC
  Administered 2019-12-10: 0.25 mg via INTRAVENOUS

## 2019-12-10 MED ORDER — SODIUM CHLORIDE 0.9% FLUSH
10.0000 mL | INTRAVENOUS | Status: DC | PRN
  Administered 2019-12-10: 10 mL via INTRAVENOUS
  Filled 2019-12-10: qty 10

## 2019-12-10 MED ORDER — SODIUM CHLORIDE 0.9% FLUSH
10.0000 mL | INTRAVENOUS | Status: DC | PRN
  Administered 2019-12-10: 10 mL
  Filled 2019-12-10: qty 10

## 2019-12-10 MED ORDER — DIPHENHYDRAMINE HCL 50 MG/ML IJ SOLN
50.0000 mg | Freq: Once | INTRAMUSCULAR | Status: AC
  Administered 2019-12-10: 50 mg via INTRAVENOUS

## 2019-12-10 NOTE — Patient Instructions (Signed)
[facility] Discharge Instructions for Patients Receiving Chemotherapy  Today you received the following chemotherapy agents Paclitaxel (TAXOL) & Carboplatin (PARAPLATIN).  To help prevent nausea and vomiting after your treatment, we encourage you to take your nausea medication as prescribed.  If you develop nausea and vomiting that is not controlled by your nausea medication, call the clinic.   BELOW ARE SYMPTOMS THAT SHOULD BE REPORTED IMMEDIATELY:  *FEVER GREATER THAN 100.5 F  *CHILLS WITH OR WITHOUT FEVER  NAUSEA AND VOMITING THAT IS NOT CONTROLLED WITH YOUR NAUSEA MEDICATION  *UNUSUAL SHORTNESS OF BREATH  *UNUSUAL BRUISING OR BLEEDING  TENDERNESS IN MOUTH AND THROAT WITH OR WITHOUT PRESENCE OF ULCERS  *URINARY PROBLEMS  *BOWEL PROBLEMS  UNUSUAL RASH Items with * indicate a potential emergency and should be followed up as soon as possible.  Feel free to call the clinic should you have any questions or concerns. The clinic phone number is [phone].  Please show the CHEMO ALERT CARD at check-in to the Emergency Department and triage nurse.   

## 2019-12-11 ENCOUNTER — Telehealth: Payer: Self-pay | Admitting: Oncology

## 2019-12-11 NOTE — Telephone Encounter (Signed)
No 6/8 los. No changes made to pt's schedule.

## 2019-12-12 ENCOUNTER — Inpatient Hospital Stay: Payer: Medicare Other

## 2019-12-12 ENCOUNTER — Other Ambulatory Visit: Payer: Self-pay

## 2019-12-12 VITALS — BP 143/87 | HR 99 | Temp 98.2°F | Resp 18

## 2019-12-12 DIAGNOSIS — Z95828 Presence of other vascular implants and grafts: Secondary | ICD-10-CM

## 2019-12-12 DIAGNOSIS — K589 Irritable bowel syndrome without diarrhea: Secondary | ICD-10-CM | POA: Diagnosis not present

## 2019-12-12 DIAGNOSIS — Z800 Family history of malignant neoplasm of digestive organs: Secondary | ICD-10-CM | POA: Diagnosis not present

## 2019-12-12 DIAGNOSIS — Z79899 Other long term (current) drug therapy: Secondary | ICD-10-CM | POA: Diagnosis not present

## 2019-12-12 DIAGNOSIS — Z5189 Encounter for other specified aftercare: Secondary | ICD-10-CM | POA: Diagnosis not present

## 2019-12-12 DIAGNOSIS — Z7982 Long term (current) use of aspirin: Secondary | ICD-10-CM | POA: Diagnosis not present

## 2019-12-12 DIAGNOSIS — Z806 Family history of leukemia: Secondary | ICD-10-CM | POA: Diagnosis not present

## 2019-12-12 DIAGNOSIS — E785 Hyperlipidemia, unspecified: Secondary | ICD-10-CM | POA: Diagnosis not present

## 2019-12-12 DIAGNOSIS — D701 Agranulocytosis secondary to cancer chemotherapy: Secondary | ICD-10-CM | POA: Diagnosis not present

## 2019-12-12 DIAGNOSIS — Z8051 Family history of malignant neoplasm of kidney: Secondary | ICD-10-CM | POA: Diagnosis not present

## 2019-12-12 DIAGNOSIS — E039 Hypothyroidism, unspecified: Secondary | ICD-10-CM | POA: Diagnosis not present

## 2019-12-12 DIAGNOSIS — C773 Secondary and unspecified malignant neoplasm of axilla and upper limb lymph nodes: Secondary | ICD-10-CM | POA: Diagnosis not present

## 2019-12-12 DIAGNOSIS — Z171 Estrogen receptor negative status [ER-]: Secondary | ICD-10-CM | POA: Diagnosis not present

## 2019-12-12 DIAGNOSIS — C50512 Malignant neoplasm of lower-outer quadrant of left female breast: Secondary | ICD-10-CM

## 2019-12-12 DIAGNOSIS — Z5111 Encounter for antineoplastic chemotherapy: Secondary | ICD-10-CM | POA: Diagnosis not present

## 2019-12-12 DIAGNOSIS — J45909 Unspecified asthma, uncomplicated: Secondary | ICD-10-CM | POA: Diagnosis not present

## 2019-12-12 DIAGNOSIS — Z803 Family history of malignant neoplasm of breast: Secondary | ICD-10-CM | POA: Diagnosis not present

## 2019-12-12 MED ORDER — FILGRASTIM-SNDZ 480 MCG/0.8ML IJ SOSY
PREFILLED_SYRINGE | INTRAMUSCULAR | Status: AC
  Filled 2019-12-12: qty 0.8

## 2019-12-12 MED ORDER — FILGRASTIM-SNDZ 480 MCG/0.8ML IJ SOSY
480.0000 ug | PREFILLED_SYRINGE | Freq: Once | INTRAMUSCULAR | Status: AC
  Administered 2019-12-12: 480 ug via SUBCUTANEOUS

## 2019-12-13 ENCOUNTER — Other Ambulatory Visit: Payer: Self-pay

## 2019-12-13 ENCOUNTER — Inpatient Hospital Stay: Payer: Medicare Other

## 2019-12-13 VITALS — BP 138/72 | HR 78 | Temp 98.2°F | Resp 18

## 2019-12-13 DIAGNOSIS — E039 Hypothyroidism, unspecified: Secondary | ICD-10-CM | POA: Diagnosis not present

## 2019-12-13 DIAGNOSIS — C50512 Malignant neoplasm of lower-outer quadrant of left female breast: Secondary | ICD-10-CM

## 2019-12-13 DIAGNOSIS — Z5111 Encounter for antineoplastic chemotherapy: Secondary | ICD-10-CM | POA: Diagnosis not present

## 2019-12-13 DIAGNOSIS — Z806 Family history of leukemia: Secondary | ICD-10-CM | POA: Diagnosis not present

## 2019-12-13 DIAGNOSIS — J45909 Unspecified asthma, uncomplicated: Secondary | ICD-10-CM | POA: Diagnosis not present

## 2019-12-13 DIAGNOSIS — Z5189 Encounter for other specified aftercare: Secondary | ICD-10-CM | POA: Diagnosis not present

## 2019-12-13 DIAGNOSIS — Z800 Family history of malignant neoplasm of digestive organs: Secondary | ICD-10-CM | POA: Diagnosis not present

## 2019-12-13 DIAGNOSIS — E785 Hyperlipidemia, unspecified: Secondary | ICD-10-CM | POA: Diagnosis not present

## 2019-12-13 DIAGNOSIS — Z803 Family history of malignant neoplasm of breast: Secondary | ICD-10-CM | POA: Diagnosis not present

## 2019-12-13 DIAGNOSIS — C773 Secondary and unspecified malignant neoplasm of axilla and upper limb lymph nodes: Secondary | ICD-10-CM | POA: Diagnosis not present

## 2019-12-13 DIAGNOSIS — Z79899 Other long term (current) drug therapy: Secondary | ICD-10-CM | POA: Diagnosis not present

## 2019-12-13 DIAGNOSIS — Z95828 Presence of other vascular implants and grafts: Secondary | ICD-10-CM

## 2019-12-13 DIAGNOSIS — Z7982 Long term (current) use of aspirin: Secondary | ICD-10-CM | POA: Diagnosis not present

## 2019-12-13 DIAGNOSIS — Z171 Estrogen receptor negative status [ER-]: Secondary | ICD-10-CM | POA: Diagnosis not present

## 2019-12-13 DIAGNOSIS — D701 Agranulocytosis secondary to cancer chemotherapy: Secondary | ICD-10-CM | POA: Diagnosis not present

## 2019-12-13 DIAGNOSIS — Z8051 Family history of malignant neoplasm of kidney: Secondary | ICD-10-CM | POA: Diagnosis not present

## 2019-12-13 DIAGNOSIS — K589 Irritable bowel syndrome without diarrhea: Secondary | ICD-10-CM | POA: Diagnosis not present

## 2019-12-13 MED ORDER — FILGRASTIM-SNDZ 480 MCG/0.8ML IJ SOSY
480.0000 ug | PREFILLED_SYRINGE | Freq: Once | INTRAMUSCULAR | Status: AC
  Administered 2019-12-13: 480 ug via SUBCUTANEOUS

## 2019-12-13 NOTE — Patient Instructions (Signed)

## 2019-12-16 DIAGNOSIS — M1712 Unilateral primary osteoarthritis, left knee: Secondary | ICD-10-CM | POA: Diagnosis not present

## 2019-12-16 DIAGNOSIS — M1711 Unilateral primary osteoarthritis, right knee: Secondary | ICD-10-CM | POA: Diagnosis not present
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
## 2019-12-17 ENCOUNTER — Inpatient Hospital Stay (HOSPITAL_BASED_OUTPATIENT_CLINIC_OR_DEPARTMENT_OTHER): Payer: Medicare Other | Admitting: Oncology

## 2019-12-17 ENCOUNTER — Inpatient Hospital Stay: Payer: Medicare Other

## 2019-12-17 ENCOUNTER — Other Ambulatory Visit: Payer: Self-pay

## 2019-12-17 ENCOUNTER — Encounter: Payer: Self-pay | Admitting: *Deleted

## 2019-12-17 VITALS — HR 96

## 2019-12-17 VITALS — BP 147/69 | HR 120 | Temp 98.3°F | Resp 18 | Ht 60.0 in | Wt 185.4 lb

## 2019-12-17 DIAGNOSIS — C50512 Malignant neoplasm of lower-outer quadrant of left female breast: Secondary | ICD-10-CM

## 2019-12-17 DIAGNOSIS — Z5111 Encounter for antineoplastic chemotherapy: Secondary | ICD-10-CM | POA: Diagnosis not present

## 2019-12-17 DIAGNOSIS — J45909 Unspecified asthma, uncomplicated: Secondary | ICD-10-CM | POA: Diagnosis not present

## 2019-12-17 DIAGNOSIS — Z800 Family history of malignant neoplasm of digestive organs: Secondary | ICD-10-CM | POA: Diagnosis not present

## 2019-12-17 DIAGNOSIS — Z171 Estrogen receptor negative status [ER-]: Secondary | ICD-10-CM | POA: Diagnosis not present

## 2019-12-17 DIAGNOSIS — Z79899 Other long term (current) drug therapy: Secondary | ICD-10-CM | POA: Diagnosis not present

## 2019-12-17 DIAGNOSIS — Z7982 Long term (current) use of aspirin: Secondary | ICD-10-CM | POA: Diagnosis not present

## 2019-12-17 DIAGNOSIS — Z8051 Family history of malignant neoplasm of kidney: Secondary | ICD-10-CM | POA: Diagnosis not present

## 2019-12-17 DIAGNOSIS — E785 Hyperlipidemia, unspecified: Secondary | ICD-10-CM | POA: Diagnosis not present

## 2019-12-17 DIAGNOSIS — Z5189 Encounter for other specified aftercare: Secondary | ICD-10-CM | POA: Diagnosis not present

## 2019-12-17 DIAGNOSIS — K589 Irritable bowel syndrome without diarrhea: Secondary | ICD-10-CM | POA: Diagnosis not present

## 2019-12-17 DIAGNOSIS — Z803 Family history of malignant neoplasm of breast: Secondary | ICD-10-CM | POA: Diagnosis not present

## 2019-12-17 DIAGNOSIS — D701 Agranulocytosis secondary to cancer chemotherapy: Secondary | ICD-10-CM | POA: Diagnosis not present

## 2019-12-17 DIAGNOSIS — C773 Secondary and unspecified malignant neoplasm of axilla and upper limb lymph nodes: Secondary | ICD-10-CM | POA: Diagnosis not present

## 2019-12-17 DIAGNOSIS — Z806 Family history of leukemia: Secondary | ICD-10-CM | POA: Diagnosis not present

## 2019-12-17 DIAGNOSIS — E039 Hypothyroidism, unspecified: Secondary | ICD-10-CM | POA: Diagnosis not present

## 2019-12-17 DIAGNOSIS — Z95828 Presence of other vascular implants and grafts: Secondary | ICD-10-CM

## 2019-12-17 LAB — CBC WITH DIFFERENTIAL/PLATELET
Abs Immature Granulocytes: 0.01 10*3/uL (ref 0.00–0.07)
Basophils Absolute: 0 10*3/uL (ref 0.0–0.1)
Basophils Relative: 0 %
Eosinophils Absolute: 0 10*3/uL (ref 0.0–0.5)
Eosinophils Relative: 0 %
HCT: 29.2 % — ABNORMAL LOW (ref 36.0–46.0)
Hemoglobin: 9.6 g/dL — ABNORMAL LOW (ref 12.0–15.0)
Immature Granulocytes: 0 %
Lymphocytes Relative: 14 %
Lymphs Abs: 0.3 10*3/uL — ABNORMAL LOW (ref 0.7–4.0)
MCH: 35 pg — ABNORMAL HIGH (ref 26.0–34.0)
MCHC: 32.9 g/dL (ref 30.0–36.0)
MCV: 106.6 fL — ABNORMAL HIGH (ref 80.0–100.0)
Monocytes Absolute: 0.3 10*3/uL (ref 0.1–1.0)
Monocytes Relative: 10 %
Neutro Abs: 1.8 10*3/uL (ref 1.7–7.7)
Neutrophils Relative %: 76 %
Platelets: 121 10*3/uL — ABNORMAL LOW (ref 150–400)
RBC: 2.74 MIL/uL — ABNORMAL LOW (ref 3.87–5.11)
RDW: 17.1 % — ABNORMAL HIGH (ref 11.5–15.5)
WBC: 2.4 10*3/uL — ABNORMAL LOW (ref 4.0–10.5)
nRBC: 0 % (ref 0.0–0.2)

## 2019-12-17 LAB — COMPREHENSIVE METABOLIC PANEL
ALT: 13 U/L (ref 0–44)
AST: 13 U/L — ABNORMAL LOW (ref 15–41)
Albumin: 3.9 g/dL (ref 3.5–5.0)
Alkaline Phosphatase: 99 U/L (ref 38–126)
Anion gap: 13 (ref 5–15)
BUN: 18 mg/dL (ref 8–23)
CO2: 19 mmol/L — ABNORMAL LOW (ref 22–32)
Calcium: 9.5 mg/dL (ref 8.9–10.3)
Chloride: 109 mmol/L (ref 98–111)
Creatinine, Ser: 0.79 mg/dL (ref 0.44–1.00)
GFR calc Af Amer: >60 mL/min (ref >60)
GFR calc non Af Amer: >60 mL/min (ref >60)
Glucose, Bld: 152 mg/dL — ABNORMAL HIGH (ref 70–99)
Potassium: 3.9 mmol/L (ref 3.5–5.1)
Sodium: 141 mmol/L (ref 135–145)
Total Bilirubin: 0.5 mg/dL (ref 0.3–1.2)
Total Protein: 6.6 g/dL (ref 6.5–8.1)

## 2019-12-17 MED ORDER — SODIUM CHLORIDE 0.9% FLUSH
10.0000 mL | INTRAVENOUS | Status: DC | PRN
  Administered 2019-12-17: 10 mL
  Filled 2019-12-17: qty 10

## 2019-12-17 MED ORDER — DIPHENHYDRAMINE HCL 50 MG/ML IJ SOLN
INTRAMUSCULAR | Status: AC
  Filled 2019-12-17: qty 1

## 2019-12-17 MED ORDER — PALONOSETRON HCL INJECTION 0.25 MG/5ML
INTRAVENOUS | Status: AC
  Filled 2019-12-17: qty 5

## 2019-12-17 MED ORDER — SODIUM CHLORIDE 0.9 % IV SOLN
80.0000 mg/m2 | Freq: Once | INTRAVENOUS | Status: AC
  Administered 2019-12-17: 150 mg via INTRAVENOUS
  Filled 2019-12-17: qty 25

## 2019-12-17 MED ORDER — SODIUM CHLORIDE 0.9 % IV SOLN
Freq: Once | INTRAVENOUS | Status: AC
  Filled 2019-12-17: qty 250

## 2019-12-17 MED ORDER — FAMOTIDINE IN NACL 20-0.9 MG/50ML-% IV SOLN
INTRAVENOUS | Status: AC
  Filled 2019-12-17: qty 50

## 2019-12-17 MED ORDER — PALONOSETRON HCL INJECTION 0.25 MG/5ML
0.2500 mg | Freq: Once | INTRAVENOUS | Status: AC
  Administered 2019-12-17: 0.25 mg via INTRAVENOUS

## 2019-12-17 MED ORDER — DIPHENHYDRAMINE HCL 50 MG/ML IJ SOLN
50.0000 mg | Freq: Once | INTRAMUSCULAR | Status: AC
  Administered 2019-12-17: 50 mg via INTRAVENOUS

## 2019-12-17 MED ORDER — HEPARIN SOD (PORK) LOCK FLUSH 100 UNIT/ML IV SOLN
500.0000 [IU] | Freq: Once | INTRAVENOUS | Status: AC | PRN
  Administered 2019-12-17: 500 [IU]
  Filled 2019-12-17: qty 5

## 2019-12-17 MED ORDER — SODIUM CHLORIDE 0.9 % IV SOLN
150.0000 mg | Freq: Once | INTRAVENOUS | Status: AC
  Administered 2019-12-17: 150 mg via INTRAVENOUS
  Filled 2019-12-17: qty 150

## 2019-12-17 MED ORDER — SODIUM CHLORIDE 0.9 % IV SOLN
182.8000 mg | Freq: Once | INTRAVENOUS | Status: AC
  Administered 2019-12-17: 180 mg via INTRAVENOUS
  Filled 2019-12-17: qty 18

## 2019-12-17 MED ORDER — SODIUM CHLORIDE 0.9 % IV SOLN
10.0000 mg | Freq: Once | INTRAVENOUS | Status: AC
  Administered 2019-12-17: 10 mg via INTRAVENOUS
  Filled 2019-12-17: qty 10

## 2019-12-17 MED ORDER — SODIUM CHLORIDE 0.9% FLUSH
10.0000 mL | INTRAVENOUS | Status: DC | PRN
  Administered 2019-12-17: 10 mL via INTRAVENOUS
  Filled 2019-12-17: qty 10

## 2019-12-17 MED ORDER — FAMOTIDINE IN NACL 20-0.9 MG/50ML-% IV SOLN
20.0000 mg | Freq: Once | INTRAVENOUS | Status: AC
  Administered 2019-12-17: 20 mg via INTRAVENOUS

## 2019-12-17 NOTE — Patient Instructions (Signed)
[facility] Discharge Instructions for Patients Receiving Chemotherapy  Today you received the following chemotherapy agents Paclitaxel (TAXOL) & Carboplatin (PARAPLATIN).  To help prevent nausea and vomiting after your treatment, we encourage you to take your nausea medication as prescribed.  If you develop nausea and vomiting that is not controlled by your nausea medication, call the clinic.   BELOW ARE SYMPTOMS THAT SHOULD BE REPORTED IMMEDIATELY:  *FEVER GREATER THAN 100.5 F  *CHILLS WITH OR WITHOUT FEVER  NAUSEA AND VOMITING THAT IS NOT CONTROLLED WITH YOUR NAUSEA MEDICATION  *UNUSUAL SHORTNESS OF BREATH  *UNUSUAL BRUISING OR BLEEDING  TENDERNESS IN MOUTH AND THROAT WITH OR WITHOUT PRESENCE OF ULCERS  *URINARY PROBLEMS  *BOWEL PROBLEMS  UNUSUAL RASH Items with * indicate a potential emergency and should be followed up as soon as possible.  Feel free to call the clinic should you have any questions or concerns. The clinic phone number is [phone].  Please show the CHEMO ALERT CARD at check-in to the Emergency Department and triage nurse.   

## 2019-12-18 ENCOUNTER — Telehealth: Payer: Self-pay | Admitting: Oncology

## 2019-12-18 NOTE — Telephone Encounter (Signed)
No 5/15 los. No changes made to pt's schedule.

## 2019-12-19 ENCOUNTER — Other Ambulatory Visit: Payer: Self-pay

## 2019-12-19 ENCOUNTER — Inpatient Hospital Stay: Payer: Medicare Other

## 2019-12-19 VITALS — BP 152/79 | HR 82 | Temp 98.2°F | Resp 18

## 2019-12-19 DIAGNOSIS — Z806 Family history of leukemia: Secondary | ICD-10-CM | POA: Diagnosis not present

## 2019-12-19 DIAGNOSIS — J45909 Unspecified asthma, uncomplicated: Secondary | ICD-10-CM | POA: Diagnosis not present

## 2019-12-19 DIAGNOSIS — C773 Secondary and unspecified malignant neoplasm of axilla and upper limb lymph nodes: Secondary | ICD-10-CM | POA: Diagnosis not present

## 2019-12-19 DIAGNOSIS — Z5111 Encounter for antineoplastic chemotherapy: Secondary | ICD-10-CM | POA: Diagnosis not present

## 2019-12-19 DIAGNOSIS — Z95828 Presence of other vascular implants and grafts: Secondary | ICD-10-CM

## 2019-12-19 DIAGNOSIS — Z800 Family history of malignant neoplasm of digestive organs: Secondary | ICD-10-CM | POA: Diagnosis not present

## 2019-12-19 DIAGNOSIS — K589 Irritable bowel syndrome without diarrhea: Secondary | ICD-10-CM | POA: Diagnosis not present

## 2019-12-19 DIAGNOSIS — C50512 Malignant neoplasm of lower-outer quadrant of left female breast: Secondary | ICD-10-CM | POA: Diagnosis not present

## 2019-12-19 DIAGNOSIS — Z8051 Family history of malignant neoplasm of kidney: Secondary | ICD-10-CM | POA: Diagnosis not present

## 2019-12-19 DIAGNOSIS — E039 Hypothyroidism, unspecified: Secondary | ICD-10-CM | POA: Diagnosis not present

## 2019-12-19 DIAGNOSIS — Z171 Estrogen receptor negative status [ER-]: Secondary | ICD-10-CM | POA: Diagnosis not present

## 2019-12-19 DIAGNOSIS — E785 Hyperlipidemia, unspecified: Secondary | ICD-10-CM | POA: Diagnosis not present

## 2019-12-19 DIAGNOSIS — D701 Agranulocytosis secondary to cancer chemotherapy: Secondary | ICD-10-CM | POA: Diagnosis not present

## 2019-12-19 DIAGNOSIS — Z7982 Long term (current) use of aspirin: Secondary | ICD-10-CM | POA: Diagnosis not present

## 2019-12-19 DIAGNOSIS — Z79899 Other long term (current) drug therapy: Secondary | ICD-10-CM | POA: Diagnosis not present

## 2019-12-19 DIAGNOSIS — Z5189 Encounter for other specified aftercare: Secondary | ICD-10-CM | POA: Diagnosis not present

## 2019-12-19 DIAGNOSIS — Z803 Family history of malignant neoplasm of breast: Secondary | ICD-10-CM | POA: Diagnosis not present

## 2019-12-19 MED ORDER — FILGRASTIM-SNDZ 480 MCG/0.8ML IJ SOSY
PREFILLED_SYRINGE | INTRAMUSCULAR | Status: AC
  Filled 2019-12-19: qty 0.8

## 2019-12-19 MED ORDER — FILGRASTIM-SNDZ 480 MCG/0.8ML IJ SOSY
480.0000 ug | PREFILLED_SYRINGE | Freq: Once | INTRAMUSCULAR | Status: AC
  Administered 2019-12-19: 480 ug via SUBCUTANEOUS

## 2019-12-20 ENCOUNTER — Other Ambulatory Visit: Payer: Self-pay

## 2019-12-20 ENCOUNTER — Inpatient Hospital Stay: Payer: Medicare Other

## 2019-12-20 VITALS — BP 132/65 | HR 99 | Temp 98.0°F | Resp 18

## 2019-12-20 DIAGNOSIS — K589 Irritable bowel syndrome without diarrhea: Secondary | ICD-10-CM | POA: Diagnosis not present

## 2019-12-20 DIAGNOSIS — C773 Secondary and unspecified malignant neoplasm of axilla and upper limb lymph nodes: Secondary | ICD-10-CM | POA: Diagnosis not present

## 2019-12-20 DIAGNOSIS — Z806 Family history of leukemia: Secondary | ICD-10-CM | POA: Diagnosis not present

## 2019-12-20 DIAGNOSIS — Z79899 Other long term (current) drug therapy: Secondary | ICD-10-CM | POA: Diagnosis not present

## 2019-12-20 DIAGNOSIS — Z8051 Family history of malignant neoplasm of kidney: Secondary | ICD-10-CM | POA: Diagnosis not present

## 2019-12-20 DIAGNOSIS — Z171 Estrogen receptor negative status [ER-]: Secondary | ICD-10-CM | POA: Diagnosis not present

## 2019-12-20 DIAGNOSIS — C50912 Malignant neoplasm of unspecified site of left female breast: Secondary | ICD-10-CM | POA: Diagnosis not present

## 2019-12-20 DIAGNOSIS — Z803 Family history of malignant neoplasm of breast: Secondary | ICD-10-CM | POA: Diagnosis not present

## 2019-12-20 DIAGNOSIS — C50512 Malignant neoplasm of lower-outer quadrant of left female breast: Secondary | ICD-10-CM

## 2019-12-20 DIAGNOSIS — E785 Hyperlipidemia, unspecified: Secondary | ICD-10-CM | POA: Diagnosis not present

## 2019-12-20 DIAGNOSIS — Z5111 Encounter for antineoplastic chemotherapy: Secondary | ICD-10-CM | POA: Diagnosis not present

## 2019-12-20 DIAGNOSIS — Z7982 Long term (current) use of aspirin: Secondary | ICD-10-CM | POA: Diagnosis not present

## 2019-12-20 DIAGNOSIS — Z800 Family history of malignant neoplasm of digestive organs: Secondary | ICD-10-CM | POA: Diagnosis not present

## 2019-12-20 DIAGNOSIS — D701 Agranulocytosis secondary to cancer chemotherapy: Secondary | ICD-10-CM | POA: Diagnosis not present

## 2019-12-20 DIAGNOSIS — E039 Hypothyroidism, unspecified: Secondary | ICD-10-CM | POA: Diagnosis not present

## 2019-12-20 DIAGNOSIS — Z95828 Presence of other vascular implants and grafts: Secondary | ICD-10-CM

## 2019-12-20 DIAGNOSIS — Z5189 Encounter for other specified aftercare: Secondary | ICD-10-CM | POA: Diagnosis not present

## 2019-12-20 DIAGNOSIS — J45909 Unspecified asthma, uncomplicated: Secondary | ICD-10-CM | POA: Diagnosis not present

## 2019-12-20 MED ORDER — FILGRASTIM-SNDZ 480 MCG/0.8ML IJ SOSY
480.0000 ug | PREFILLED_SYRINGE | Freq: Once | INTRAMUSCULAR | Status: AC
  Administered 2019-12-20: 480 ug via SUBCUTANEOUS

## 2019-12-20 MED ORDER — FILGRASTIM-SNDZ 480 MCG/0.8ML IJ SOSY
PREFILLED_SYRINGE | INTRAMUSCULAR | Status: AC
  Filled 2019-12-20: qty 0.8

## 2019-12-21 ENCOUNTER — Ambulatory Visit: Payer: Self-pay | Admitting: Surgery

## 2019-12-21 DIAGNOSIS — C50912 Malignant neoplasm of unspecified site of left female breast: Secondary | ICD-10-CM

## 2019-12-21 NOTE — H&P (Signed)
History of Present Illness Julie Thompson. Julie Brule MD; 12/21/2019 12:23 PM) The patient is a 74 year old female who presents with breast cancer.PCP - Carlena Sax Reason for evaluation - Breast Cancer [facility] [place]- Isidore Moos  This is a 74 year old female who presents after a recent screening mammogram that discovered a mass in the left breast. She underwent further work-up that revealed a bilobed, dumbell-shaped mass with calcifications with total diameter about 3.7 cm. She also had a single abnormal lymph node in the left axilla. 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 The lymph node was biopsied and revealed metastatic disease. She was seen in the [facility]  On 07/29/19, she underwent ultrasound-guided port placement. The port has been functioning well. She is receiving her neoadjuvant chemotherapy. She is having minimal side effects with the chemotherapy. She has one more cycle of chemo. She is still able to perform her activities of daily living and to help take care of her elderly father. She thinks that she feels like the tumor is getting softer. She seems to be in good spirits.  Follow-up MRI was performed on 11/15/19  1. No abnormalities identified in the right breast. 2. The patient's biopsied malignancy in the left breast was dumbbell shaped with anterior and posterior portions. There is no abnormal enhancement associated with the posterior portion. There is peripheral ring-like enhancement around the anterior portion measuring 1.5 x 1.2 x 1.8 cm, extending to the skin overlying the inferior aspect of the mass with slight skin dimpling suggested on coronal imaging. Skin involvement cannot be definitively determined on these images. 3. No adenopathy.  RECOMMENDATION: Recommend continued surgical and oncologic follow up.    Allergies Darden Palmer, [place]; 12/20/2019 9:50 AM) Sulfa Antibiotics Rash. Penicillins  Hives. Allergies Reconciled  Medication History Darden Palmer, [place]; 12/20/2019 9:53 AM) Fish Oil Extra Strength (1200MG  Capsule, Oral) Active. Lidocaine-Prilocaine (2.5-2.5% Cream, External) Active. LORazepam (0.5MG  Tablet, Oral) Active. Aspirin (81MG  Tablet DR, Oral) Active. Multiple Vitamin (1 (one) Oral) Active. Atorvastatin Calcium (20MG  Tablet, Oral) Active. Levothyroxine Sodium (50MCG Tablet, Oral) Active. Nabumetone (500MG  Tablet, Oral) Active.    Vitals Lattie Haw [place] RMA; 12/20/2019 9:53 AM) 12/20/2019 9:53 AM Weight: 183.5 lb Height: 60in Body Surface Area: 1.8 m Body Mass Index: 35.84 kg/m  Temp.: 78F  Pulse: 123 (Regular)  BP: 142/72(Sitting, Left Arm, Standard)        Physical Exam Rodman Key K. Bodin Gorka MD; 12/21/2019 12:23 PM)  The physical exam findings are as follows: Note:WDWN in NAD Eyes: Pupils equal, round; sclera anicteric HENT: Oral mucosa moist; good dentition Neck: No masses palpated, no thyromegaly Lungs: CTA bilaterally; normal respiratory effort Breasts: No further mass palpated in the left lower breast. No other palpable masses appreciated in the breasts or axillae bilaterally. CV: Regular rate and rhythm; no murmurs; extremities well-perfused with no edema Abd: +bowel sounds, soft, non-tender, no palpable organomegaly; no palpable hernias Skin: Warm, dry; no sign of jaundice Psychiatric - alert and oriented x 4; calm mood and affect    Assessment & Plan Rodman Key K. Sorrel Cassetta MD; 12/21/2019 12:16 PM)  INVASIVE DUCTAL CARCINOMA OF LEFT BREAST, STAGE 3 (C50.912)  Current Plans Schedule for Surgery - Left radioactive seed localized lumpectomy/ targeted axillary lymph node dissection. The surgical procedure has been discussed with the patient. Potential risks, benefits, alternative treatments, and expected outcomes have been explained. All of the patient's questions at this time have been answered. The likelihood of reaching  the patient's treatment goal is good. The patient understand the proposed surgical procedure and wishes to proceed.  Julie Thompson. Georgette Dover, MD, [facility]  General/ Trauma Surgery   12/21/2019 12:24 PM

## 2019-12-21 NOTE — H&P (View-Only) (Signed)
History of Present Illness Imogene Burn. Philmore Lepore MD; 12/21/2019 12:23 PM) The patient is a 74 year old female who presents with breast cancer.PCP - Carlena Sax Reason for evaluation - Breast Cancer [facility]- Isidore Moos  This is a 74 year old female who presents after a recent screening mammogram that discovered a mass in the left breast. She underwent further work-up that revealed a bilobed, dumbell-shaped mass with calcifications with total diameter about 3.7 cm. She also had a single abnormal lymph node in the left axilla. 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 The lymph node was biopsied and revealed metastatic disease. She was seen in the [facility]  On 07/29/19, she underwent ultrasound-guided port placement. The port has been functioning well. She is receiving her neoadjuvant chemotherapy. She is having minimal side effects with the chemotherapy. She has one more cycle of chemo. She is still able to perform her activities of daily living and to help take care of her elderly father. She thinks that she feels like the tumor is getting softer. She seems to be in good spirits.  Follow-up MRI was performed on 11/15/19  1. No abnormalities identified in the right breast. 2. The patient's biopsied malignancy in the left breast was dumbbell shaped with anterior and posterior portions. There is no abnormal enhancement associated with the posterior portion. There is peripheral ring-like enhancement around the anterior portion measuring 1.5 x 1.2 x 1.8 cm, extending to the skin overlying the inferior aspect of the mass with slight skin dimpling suggested on coronal imaging. Skin involvement cannot be definitively determined on these images. 3. No adenopathy.  RECOMMENDATION: Recommend continued surgical and oncologic follow up.    Allergies Darden Palmer, [place]; 12/20/2019 9:50 AM) Sulfa Antibiotics Rash. Penicillins  Hives. Allergies Reconciled  Medication History Darden Palmer, [place]; 12/20/2019 9:53 AM) Fish Oil Extra Strength (1200MG  Capsule, Oral) Active. Lidocaine-Prilocaine (2.5-2.5% Cream, External) Active. LORazepam (0.5MG  Tablet, Oral) Active. Aspirin (81MG  Tablet DR, Oral) Active. Multiple Vitamin (1 (one) Oral) Active. Atorvastatin Calcium (20MG  Tablet, Oral) Active. Levothyroxine Sodium (50MCG Tablet, Oral) Active. Nabumetone (500MG  Tablet, Oral) Active.    Vitals Lattie Haw Tidioute RMA; 12/20/2019 9:53 AM) 12/20/2019 9:53 AM Weight: 183.5 lb Height: 60in Body Surface Area: 1.8 m Body Mass Index: 35.84 kg/m  Temp.: 26F  Pulse: 123 (Regular)  BP: 142/72(Sitting, Left Arm, Standard)        Physical Exam Rodman Key K. Kenzleigh Sedam MD; 12/21/2019 12:23 PM)  The physical exam findings are as follows: Note:WDWN in NAD Eyes: Pupils equal, round; sclera anicteric HENT: Oral mucosa moist; good dentition Neck: No masses palpated, no thyromegaly Lungs: CTA bilaterally; normal respiratory effort Breasts: No further mass palpated in the left lower breast. No other palpable masses appreciated in the breasts or axillae bilaterally. CV: Regular rate and rhythm; no murmurs; extremities well-perfused with no edema Abd: +bowel sounds, soft, non-tender, no palpable organomegaly; no palpable hernias Skin: Warm, dry; no sign of jaundice Psychiatric - alert and oriented x 4; calm mood and affect    Assessment & Plan Rodman Key K. 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 The surgical procedure has been discussed with the patient. Potential risks, benefits, alternative treatments, and expected outcomes have been explained. All of the patient's questions at this time have been answered. The likelihood of reaching  the patient's treatment goal is good. The patient understand the proposed surgical procedure and wishes to proceed.  Imogene Burn. Georgette Dover, MD, [facility] Surgery  General/ Trauma Surgery   12/21/2019 12:24 PM

## 2019-12-23 MED FILL — Fosaprepitant Dimeglumine For IV Infusion 150 MG (Base Eq): INTRAVENOUS | Qty: 5 | Status: AC

## 2019-12-23 MED FILL — Dexamethasone Sodium Phosphate Inj 100 MG/10ML: INTRAMUSCULAR | Qty: 1 | Status: AC

## 2019-12-23 NOTE — Progress Notes (Signed)
[place]  [phone] [phone]     ID: [name] DOB: [birth year]  MR#: [id number]  IHK#:[id number]  Patient Care Team: Leeroy Cha, MD as PCP - General (Internal Medicine) Rockwell Germany, RN as Oncology Nurse Navigator Mauro Kaufmann, RN as Oncology Nurse Navigator Eppie Gibson, MD as Attending Physician (Radiation Oncology) Donnie Mesa, MD as Consulting Physician (General Surgery) Tresten Pantoja, Virgie Dad, MD as Consulting Physician (Oncology) Melrose Nakayama, MD as Consulting Physician (Orthopedic Surgery) Chauncey Cruel, MD OTHER MD:  CHIEF COMPLAINT: triple negative breast cancer  CURRENT TREATMENT: Definitive surgery pending   INTERVAL HISTORY: Haven returns today for follow up and treatment of her triple negative breast cancer.   She continues on weekly Paclitaxel and carboplatin.  Today she was scheduled for her final dose.  However her counts are not going to allow [place] to treat her today and we are simply going to cancel this dose.  REVIEW OF SYSTEMS: Gypsy had some problems with nausea.  She wonders if that really was related to anxiety related to her dad's situation however he is doing much better, something she is grateful for.  She has not had any intercurrent fever rash or bleeding.  She still tired but doing her housework.  She is having regular bowel movements.  She has had no peripheral neuropathy problems.  A detailed review of systems today was otherwise stable    HISTORY OF CURRENT ILLNESS: From the original intake note:  Jackson Fetters had routine screening mammography on 06/21/2019 showing a possible abnormality in the left breast. She underwent left diagnostic mammography with tomography and left breast ultrasonography at [facility] on 07/01/2019 showing: breast density category C; either 2 adjacent masses or 1 complex dumbbell-shaped mass containing calcifications in the left breast at 6  o'clock, the largest of which measures 2.1 cm; chest wall invasion not excluded; 1-2 mm group of calcifications just anterior to the mass(es); single abnormal lymph node in the left axilla; vascular calcifications in anterior left breast.  Accordingly on 07/09/2019 she proceeded to biopsy of the left breast areas in question. The pathology from this procedure ([id number]) showed:  1. Left Breast, 5:30, posterior  - invasive ductal carcinoma with necrosis, grade 3  - Prognostic indicators significant for: estrogen receptor, 0% negative and progesterone receptor, 0% negative. Proliferation marker Ki67 at 60%. HER2 negative by immunohistochemistry (0). 2. Left Breast, 5:30, posterior  - invasive ductal carcinoma with necrosis, grade 3  - ductal carcinoma in situ, intermediate grade 3. Left Axilla, lymph node (1)  - metastatic carcinoma involving a lymph node  And additional biopsy of the anterior vascular calcifications in the left breast was performed on 07/10/2019. Pathology [id number]) showed: fibrocystic changes with calcifications; no malignancy identified.  This was felt to be concordant.  The patient's subsequent history is as detailed below.   PAST MEDICAL HISTORY: Past Medical History:  Diagnosis Date  . Asthma    no problems now  . Family history of basal cell carcinoma   . Family history of breast cancer   . Family history of colon cancer   . Family history of kidney cancer   . Family history of leukemia   . Family history of peritoneal cancer   . GERD (gastroesophageal reflux disease)   . H/O hiatal hernia   . Hyperlipidemia   . Hypothyroidism   . IBS (irritable bowel syndrome)   . lt breast ca dx'd 07/10/19    PAST SURGICAL HISTORY: Past  Surgical History:  Procedure Laterality Date  . ABDOMINAL HYSTERECTOMY     age 44  . BREAST SURGERY    . DILATION AND CURETTAGE OF UTERUS     2 miscarriages age 79 & 47  . FLEXIBLE SIGMOIDOSCOPY N/A 10/22/2013   Procedure: FLEXIBLE  SIGMOIDOSCOPY;  Surgeon: Garlan Fair, MD;  Location: [facility];  Service: Endoscopy;  Laterality: N/A;  . KNEE ARTHROSCOPY Left   . PORTACATH PLACEMENT Right 07/29/2019   Procedure: INSERTION PORT-A-CATH WITH ULTRASOUND;  Surgeon: Donnie Mesa, MD;  Location: [facility];  Service: General;  Laterality: Right;  . TONSILLECTOMY     age 46    FAMILY HISTORY: Family History  Problem Relation Age of Onset  . Breast cancer Maternal Aunt        dx. in her 65s  . Cancer Mother 61       peritoneal cancer, death certificate lists ovarian cancer  . Colon cancer Paternal Uncle        dx. late 36s or early 104s  . Basal cell carcinoma Sister 89       a second diagnosed in her 75s  . Kidney cancer Maternal Grandmother 40  . Diabetes Paternal Grandmother   . Heart Problems Paternal Grandmother   . Leukemia Paternal Uncle        dx. early 80s   Patient's father is currently living at age 81 as of August 26, 2019. Patient's mother died from ovarian cancer at age 8. She was diagnosed at age 53. The patient reports breast cancer in a maternal aunt, diagnosed in her 35's. The patient also noted colon cancer in a paternal uncle and kidney cancer in her maternal grandmother.  She has 1 sister.   GYNECOLOGIC HISTORY:  No LMP recorded. Patient has had a hysterectomy. Menarche: 31.74 years old Age at first live birth: 74 years old [place] P 2 LMP age 16 (with hysterectomy) Contraceptive: never used HRT used for 10-12 years  Hysterectomy? Yes, age 88 BSO? yes   SOCIAL HISTORY: (updated 08-26-19)  Iv[place]e retired from working as an Manufacturing engineer.  Husband [name] is a retired Psychologist, prison and probation services. She lives at home with husband Butch. She has two children from a prior marriage. Son [name], age 52, works as a Games developer in [place], [place]. Son [name], age 7, works as a Programme researcher, broadcasting/film/video in [place], [place]. The patient has 4 grandchildren and 4 great-grandchildren. She  attends [employer].    ADVANCED DIRECTIVES: In the absence of any documentation to the contrary, the patient's spouse is their HCPOA.    HEALTH MAINTENANCE: Social History   Tobacco Use  . Smoking status: Never Smoker  . Smokeless tobacco: Never Used  Substance Use Topics  . Alcohol use: No  . Drug use: No     Colonoscopy: 09/2013, with CT virtual 11/2013  PAP: none on file, s/p hysterectomy  Bone density: 04/2001, -0.5   Allergies  Allergen Reactions  . Sulfa Antibiotics Rash  . Penicillins Hives    Current Outpatient Medications  Medication Sig Dispense Refill  . aspirin EC 81 MG tablet Take 81 mg by mouth daily.    Marland Kitchen atorvastatin (LIPITOR) 20 MG tablet Take 20 mg by mouth daily.    . Cholecalciferol (VITAMIN D) 2000 UNITS tablet Take 2,000 Units by mouth daily.    Marland Kitchen co-enzyme Q-10 30 MG capsule Take 20 mg by mouth 3 (three) times daily.    Marland Kitchen esomeprazole (NEXIUM) 20 MG capsule  Take 20 mg by mouth daily at 12 noon.    . famotidine (PEPCID) 20 MG tablet Take 40 mg by mouth 2 (two) times daily.    Marland Kitchen FIBER PO Take 1 tablet by mouth daily.    . fluconazole (DIFLUCAN) 200 MG tablet Take 1 tablet (200 mg total) by mouth daily. 30 tablet 0  . levothyroxine (SYNTHROID) 50 MCG tablet Take 50 mcg by mouth daily.    Marland Kitchen lidocaine-prilocaine (EMLA) cream Apply 1 application topically as needed. 30 g 0  . loratadine (CLARITIN) 10 MG tablet Take 1 tablet (10 mg total) by mouth daily. 60 tablet 1  . LORazepam (ATIVAN) 0.5 MG tablet TAKE 1 TABLET(0.5 MG) BY MOUTH AT BEDTIME AS NEEDED FOR NAUSEA OR VOMITING 30 tablet 0  . Multiple Vitamin (MULTIVITAMIN WITH MINERALS) TABS tablet Take 1 tablet by mouth daily.    . nabumetone (RELAFEN) 500 MG tablet Take 500 mg by mouth 2 (two) times daily as needed.    . Omega-3 Fatty Acids (FISH OIL) 1200 MG CAPS Take 1,200 mg by mouth daily.    . prochlorperazine (COMPAZINE) 10 MG tablet Take 1 tablet (10 mg total) by mouth every 6 (six)  hours as needed (Nausea or vomiting). 30 tablet 1  . valACYclovir (VALTREX) 500 MG tablet Take 1 tablet (500 mg total) by mouth 2 (two) times daily. 180 tablet 0   No current facility-administered medications for this visit.    OBJECTIVE: White woman in no acute distress Vitals:   12/24/19 0823  BP: 122/90  Pulse: (!) 105  Resp: 18  Temp: 97.7 F (36.5 C)  SpO2: 100%     Body mass index is 36.19 kg/m.   Wt Readings from Last 3 Encounters:  12/24/19 185 lb 4.8 oz (84.1 kg)  12/17/19 185 lb 6.4 oz (84.1 kg)  12/10/19 187 lb 1.6 oz (84.9 kg)  ECOG FS:1 - Symptomatic but completely ambulatory  Sclerae unicteric, EOMs intact Wearing a mask No cervical or supraclavicular adenopathy Lungs no rales or rhonchi Heart regular rate and rhythm Abd soft, nontender, positive bowel sounds MSK no focal spinal tenderness, no upper extremity lymphedema Neuro: nonfocal, well oriented, appropriate affect Breasts: Deferred  LAB RESULTS:  CMP     Component Value Date/Time   NA 143 12/24/2019 0810   K 3.9 12/24/2019 0810   CL 110 12/24/2019 0810   CO2 23 12/24/2019 0810   GLUCOSE 119 (H) 12/24/2019 0810   BUN 12 12/24/2019 0810   CREATININE 0.76 12/24/2019 0810   CREATININE 0.94 07/17/2019 0823   CALCIUM 9.0 12/24/2019 0810   PROT 6.0 (L) 12/24/2019 0810   ALBUMIN 3.7 12/24/2019 0810   AST 13 (L) 12/24/2019 0810   AST 21 07/17/2019 0823   ALT 13 12/24/2019 0810   ALT 18 07/17/2019 0823   ALKPHOS 90 12/24/2019 0810   BILITOT 0.4 12/24/2019 0810   BILITOT 0.4 07/17/2019 0823   GFRNONAA >60 12/24/2019 0810   GFRNONAA >60 07/17/2019 0823   GFRAA >60 12/24/2019 0810   GFRAA >60 07/17/2019 0823    No results found for: TOTALPROTELP, ALBUMINELP, A1GS, A2GS, BETS, BETA2SER, GAMS, MSPIKE, SPEI  Lab Results  Component Value Date   WBC 1.5 (L) 12/24/2019   NEUTROABS 0.8 (L) 12/24/2019   HGB 8.7 (L) 12/24/2019   HCT 26.6 (L) 12/24/2019   MCV 107.3 (H) 12/24/2019   PLT 70 (L)  12/24/2019    No results found for: LABCA2  No components found for: UGQBVQ945  No results  for input(s): INR in the last 168 hours.  No results found for: LABCA2  No results found for: DGU440  No results found for: HKV425  No results found for: ZDG387  No results found for: CA2729  No components found for: HGQUANT  No results found for: CEA1 / No results found for: CEA1   No results found for: AFPTUMOR  No results found for: CHROMOGRNA  No results found for: KPAFRELGTCHN, LAMBDASER, KAPLAMBRATIO (kappa/lambda light chains)  No results found for: HGBA, HGBA2QUANT, HGBFQUANT, HGBSQUAN (Hemoglobinopathy evaluation)   No results found for: LDH  No results found for: IRON, TIBC, IRONPCTSAT (Iron and TIBC)  No results found for: FERRITIN  Urinalysis No results found for: COLORURINE, APPEARANCEUR, LABSPEC, PHURINE, GLUCOSEU, HGBUR, BILIRUBINUR, KETONESUR, PROTEINUR, UROBILINOGEN, NITRITE, LEUKOCYTESUR   STUDIES: No results found.   ELIGIBLE FOR AVAILABLE RESEARCH PROTOCOL: no  ASSESSMENT: 74 y.o. [place] woman status post left breast lower outer quadrant biopsy 07/09/2019 for a clinical T2 N1, stage IIIB invasive ductal carcinoma, grade 3, triple negative, with an MIB-1 of 60%  (a) chest CT scan and bone scan 07/31/2019 showed no evidence of metastatic disease  (b) MRI biopsy of 2 additional areas in the left breast (central, 07/10/2019, and at 8:30 o'clock on 07/26/2019) are both benign and concordant  (c) biopsy of 2 suspicious areas in the right breast on 07/26/2019 (9:00 and lower outer quadrant) were benign and concordant  (d) PET scan 11/21/2019 shows a 1.2 cm nodule in the left breast with an SUV max of 2.7, aortic calcification, but no lung liver or bone lesions, no evidence of metastases   (1) neoadjuvant chemotherapy consisting of cyclophosphamide and doxorubicin in dose dense fashion x4 started 07/30/2019, completed 09/10/2019, followed by weekly  carboplatin and paclitaxel x12 started 09/24/2019  (a) cycle 6 carboplatin/paclitaxel delayed 2 weeks because of neutropenia: granix added  (b) cycle 12 of carboplatin/paclitaxel omitted secondary to persistent low counts  (2) definitive surgery to follow with targeted axillary lymph node dissection  (3) adjuvant radiation to follow  (4) genetics testing 07/23/2019 through the STAT Breast cancer panel offered by [facility] found no deleterious mutations in ATM, BRCA1, BRCA2, CDH1, CHEK2, PALB2, PTEN, STK11 and TP53.  Additional testing through the Common Hereditary Cancers Panel offered by [facility] also found no deleterious mutations in IPC, ATM, AXIN2, BARD1, BMPR1A, BRCA1, BRCA2, BRIP1, CDH1, CDK4, CDKN2A (p14ARF), CDKN2A (p16INK4a), CHEK2, CTNNA1, DICER1, EPCAM (Deletion/duplication testing only), GREM1 (promoter region deletion/duplication testing only), KIT, MEN1, MLH1, MSH2, MSH3, MSH6, MUTYH, NBN, NF1, NHTL1, PALB2, PDGFRA, PMS2, POLD1, POLE, PTEN, RAD50, RAD51C, RAD51D, RNF43, SDHB, SDHC, SDHD, SMAD4, SMARCA4. STK11, TP53, TSC1, TSC2, and VHL.  The following genes were evaluated for sequence changes only: SDHA and HOXB13 c.251G>A variant only.   (a) 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   (5) considering F6433 study   PLAN: Vendela's counts will not allow [place] to treat her today.  At this point we are simply going to omit this final 12th dose of paclitaxel and carboplatin.  We are not going to give her Granix the next 2days, since that seems to have been causing her some symptoms.  Her counts certainly should improve over the next 2 weeks, which is about when she will be set up for surgery.  The plan there is for breast conservation with targeted axillary dissection.  Dr. Georgette Dover is looking for a date for her.  I thought Caitland might qualify for S14 18 but it turns  out the study has been closed and she will not be enrolled.  Accordingly it is appropriate  to remove her port when she has her definitive surgery.  I am adding lab work only on July 6 to make sure her counts are adequate for her surgery  Otherwise she will return to see me July 23 and we will consider capecitabine adjuvantly as well as radiation at that time  Total encounter time 30 minutes.Sarajane Jews C. Ricki Clack, MD 12/25/19 10:22 PM Medical Oncology and Hematology [facility] [place], [place] [address] Tel. [phone]    Fax. [phone]   I, Wilburn Mylar, am acting as scribe for Dr. Virgie Dad. Jettson Crable.  I, Lurline Del MD, have reviewed the above documentation for accuracy and completeness, and I agree with the above.   *Total Encounter Time as defined by the Centers for Medicare and Medicaid Services includes, in addition to the face-to-face time of a patient visit (documented in the note above) non-face-to-face time: obtaining and reviewing outside history, ordering and reviewing medications, tests or procedures, care coordination (communications with other health care professionals or caregivers) and documentation in the medical record.

## 2019-12-24 ENCOUNTER — Other Ambulatory Visit: Payer: Self-pay | Admitting: Surgery

## 2019-12-24 ENCOUNTER — Inpatient Hospital Stay: Payer: Medicare Other

## 2019-12-24 ENCOUNTER — Telehealth: Payer: Self-pay | Admitting: Oncology

## 2019-12-24 ENCOUNTER — Other Ambulatory Visit: Payer: Self-pay

## 2019-12-24 ENCOUNTER — Inpatient Hospital Stay (HOSPITAL_BASED_OUTPATIENT_CLINIC_OR_DEPARTMENT_OTHER): Payer: Medicare Other | Admitting: Oncology

## 2019-12-24 ENCOUNTER — Encounter: Payer: Self-pay | Admitting: *Deleted

## 2019-12-24 VITALS — BP 122/90 | HR 105 | Temp 97.7°F | Resp 18 | Ht 60.0 in | Wt 185.3 lb

## 2019-12-24 DIAGNOSIS — D701 Agranulocytosis secondary to cancer chemotherapy: Secondary | ICD-10-CM | POA: Diagnosis not present

## 2019-12-24 DIAGNOSIS — C50512 Malignant neoplasm of lower-outer quadrant of left female breast: Secondary | ICD-10-CM

## 2019-12-24 DIAGNOSIS — Z79899 Other long term (current) drug therapy: Secondary | ICD-10-CM | POA: Diagnosis not present

## 2019-12-24 DIAGNOSIS — Z171 Estrogen receptor negative status [ER-]: Secondary | ICD-10-CM | POA: Diagnosis not present

## 2019-12-24 DIAGNOSIS — E039 Hypothyroidism, unspecified: Secondary | ICD-10-CM | POA: Diagnosis not present

## 2019-12-24 DIAGNOSIS — C50912 Malignant neoplasm of unspecified site of left female breast: Secondary | ICD-10-CM

## 2019-12-24 DIAGNOSIS — Z800 Family history of malignant neoplasm of digestive organs: Secondary | ICD-10-CM | POA: Diagnosis not present

## 2019-12-24 DIAGNOSIS — Z5111 Encounter for antineoplastic chemotherapy: Secondary | ICD-10-CM | POA: Diagnosis not present

## 2019-12-24 DIAGNOSIS — Z8051 Family history of malignant neoplasm of kidney: Secondary | ICD-10-CM | POA: Diagnosis not present

## 2019-12-24 DIAGNOSIS — Z7982 Long term (current) use of aspirin: Secondary | ICD-10-CM | POA: Diagnosis not present

## 2019-12-24 DIAGNOSIS — Z5189 Encounter for other specified aftercare: Secondary | ICD-10-CM | POA: Diagnosis not present

## 2019-12-24 DIAGNOSIS — Z806 Family history of leukemia: Secondary | ICD-10-CM | POA: Diagnosis not present

## 2019-12-24 DIAGNOSIS — K589 Irritable bowel syndrome without diarrhea: Secondary | ICD-10-CM | POA: Diagnosis not present

## 2019-12-24 DIAGNOSIS — Z803 Family history of malignant neoplasm of breast: Secondary | ICD-10-CM | POA: Diagnosis not present

## 2019-12-24 DIAGNOSIS — C773 Secondary and unspecified malignant neoplasm of axilla and upper limb lymph nodes: Secondary | ICD-10-CM | POA: Diagnosis not present

## 2019-12-24 DIAGNOSIS — J45909 Unspecified asthma, uncomplicated: Secondary | ICD-10-CM | POA: Diagnosis not present

## 2019-12-24 DIAGNOSIS — Z95828 Presence of other vascular implants and grafts: Secondary | ICD-10-CM

## 2019-12-24 DIAGNOSIS — E785 Hyperlipidemia, unspecified: Secondary | ICD-10-CM | POA: Diagnosis not present

## 2019-12-24 LAB — COMPREHENSIVE METABOLIC PANEL
ALT: 13 U/L (ref 0–44)
AST: 13 U/L — ABNORMAL LOW (ref 15–41)
Albumin: 3.7 g/dL (ref 3.5–5.0)
Alkaline Phosphatase: 90 U/L (ref 38–126)
Anion gap: 10 (ref 5–15)
BUN: 12 mg/dL (ref 8–23)
CO2: 23 mmol/L (ref 22–32)
Calcium: 9 mg/dL (ref 8.9–10.3)
Chloride: 110 mmol/L (ref 98–111)
Creatinine, Ser: 0.76 mg/dL (ref 0.44–1.00)
GFR calc Af Amer: >60 mL/min (ref >60)
GFR calc non Af Amer: >60 mL/min (ref >60)
Glucose, Bld: 119 mg/dL — ABNORMAL HIGH (ref 70–99)
Potassium: 3.9 mmol/L (ref 3.5–5.1)
Sodium: 143 mmol/L (ref 135–145)
Total Bilirubin: 0.4 mg/dL (ref 0.3–1.2)
Total Protein: 6 g/dL — ABNORMAL LOW (ref 6.5–8.1)

## 2019-12-24 LAB — CBC WITH DIFFERENTIAL/PLATELET
Abs Immature Granulocytes: 0.01 10*3/uL (ref 0.00–0.07)
Basophils Absolute: 0 10*3/uL (ref 0.0–0.1)
Basophils Relative: 1 %
Eosinophils Absolute: 0 10*3/uL (ref 0.0–0.5)
Eosinophils Relative: 1 %
HCT: 26.6 % — ABNORMAL LOW (ref 36.0–46.0)
Hemoglobin: 8.7 g/dL — ABNORMAL LOW (ref 12.0–15.0)
Immature Granulocytes: 1 %
Lymphocytes Relative: 25 %
Lymphs Abs: 0.4 10*3/uL — ABNORMAL LOW (ref 0.7–4.0)
MCH: 35.1 pg — ABNORMAL HIGH (ref 26.0–34.0)
MCHC: 32.7 g/dL (ref 30.0–36.0)
MCV: 107.3 fL — ABNORMAL HIGH (ref 80.0–100.0)
Monocytes Absolute: 0.3 10*3/uL (ref 0.1–1.0)
Monocytes Relative: 19 %
Neutro Abs: 0.8 10*3/uL — ABNORMAL LOW (ref 1.7–7.7)
Neutrophils Relative %: 53 %
Platelets: 70 10*3/uL — ABNORMAL LOW (ref 150–400)
RBC: 2.48 MIL/uL — ABNORMAL LOW (ref 3.87–5.11)
RDW: 17.1 % — ABNORMAL HIGH (ref 11.5–15.5)
WBC: 1.5 10*3/uL — ABNORMAL LOW (ref 4.0–10.5)
nRBC: 1.4 % — ABNORMAL HIGH (ref 0.0–0.2)

## 2019-12-24 MED ORDER — SODIUM CHLORIDE 0.9% FLUSH
10.0000 mL | Freq: Once | INTRAVENOUS | Status: AC
  Administered 2019-12-24: 10 mL
  Filled 2019-12-24: qty 10

## 2019-12-24 NOTE — Telephone Encounter (Signed)
Scheduled appts 6/22 los. Gave pt a print out of AVS.

## 2019-12-25 ENCOUNTER — Encounter: Payer: Self-pay | Admitting: *Deleted

## 2019-12-25 DIAGNOSIS — C50512 Malignant neoplasm of lower-outer quadrant of left female breast: Secondary | ICD-10-CM

## 2019-12-26 ENCOUNTER — Ambulatory Visit: Payer: Self-pay | Admitting: Surgery

## 2020-01-06 DIAGNOSIS — M170 Bilateral primary osteoarthritis of knee: Secondary | ICD-10-CM | POA: Diagnosis not present

## 2020-01-06 DIAGNOSIS — E039 Hypothyroidism, unspecified: Secondary | ICD-10-CM | POA: Diagnosis not present

## 2020-01-06 DIAGNOSIS — E7800 Pure hypercholesterolemia, unspecified: Secondary | ICD-10-CM | POA: Diagnosis not present

## 2020-01-07 ENCOUNTER — Inpatient Hospital Stay: Payer: Medicare Other

## 2020-01-07 ENCOUNTER — Other Ambulatory Visit: Payer: Self-pay

## 2020-01-07 ENCOUNTER — Inpatient Hospital Stay: Payer: Medicare Other | Attending: Oncology

## 2020-01-07 DIAGNOSIS — Z794 Long term (current) use of insulin: Secondary | ICD-10-CM | POA: Insufficient documentation

## 2020-01-07 DIAGNOSIS — C50512 Malignant neoplasm of lower-outer quadrant of left female breast: Secondary | ICD-10-CM | POA: Insufficient documentation

## 2020-01-07 DIAGNOSIS — Z806 Family history of leukemia: Secondary | ICD-10-CM | POA: Insufficient documentation

## 2020-01-07 DIAGNOSIS — E109 Type 1 diabetes mellitus without complications: Secondary | ICD-10-CM | POA: Insufficient documentation

## 2020-01-07 DIAGNOSIS — Z803 Family history of malignant neoplasm of breast: Secondary | ICD-10-CM | POA: Insufficient documentation

## 2020-01-07 DIAGNOSIS — J45909 Unspecified asthma, uncomplicated: Secondary | ICD-10-CM | POA: Diagnosis not present

## 2020-01-07 DIAGNOSIS — Z8349 Family history of other endocrine, nutritional and metabolic diseases: Secondary | ICD-10-CM | POA: Diagnosis not present

## 2020-01-07 DIAGNOSIS — Z171 Estrogen receptor negative status [ER-]: Secondary | ICD-10-CM | POA: Diagnosis not present

## 2020-01-07 DIAGNOSIS — Z7982 Long term (current) use of aspirin: Secondary | ICD-10-CM | POA: Diagnosis not present

## 2020-01-07 DIAGNOSIS — Z90710 Acquired absence of both cervix and uterus: Secondary | ICD-10-CM | POA: Diagnosis not present

## 2020-01-07 DIAGNOSIS — Z79899 Other long term (current) drug therapy: Secondary | ICD-10-CM | POA: Insufficient documentation

## 2020-01-07 DIAGNOSIS — Z833 Family history of diabetes mellitus: Secondary | ICD-10-CM | POA: Insufficient documentation

## 2020-01-07 DIAGNOSIS — Z9221 Personal history of antineoplastic chemotherapy: Secondary | ICD-10-CM | POA: Diagnosis not present

## 2020-01-07 DIAGNOSIS — C773 Secondary and unspecified malignant neoplasm of axilla and upper limb lymph nodes: Secondary | ICD-10-CM | POA: Insufficient documentation

## 2020-01-07 DIAGNOSIS — Z8051 Family history of malignant neoplasm of kidney: Secondary | ICD-10-CM | POA: Diagnosis not present

## 2020-01-07 DIAGNOSIS — Z8049 Family history of malignant neoplasm of other genital organs: Secondary | ICD-10-CM | POA: Diagnosis not present

## 2020-01-07 DIAGNOSIS — Z800 Family history of malignant neoplasm of digestive organs: Secondary | ICD-10-CM | POA: Insufficient documentation

## 2020-01-07 LAB — CBC WITH DIFFERENTIAL/PLATELET
Abs Immature Granulocytes: 0 10*3/uL (ref 0.00–0.07)
Basophils Absolute: 0 10*3/uL (ref 0.0–0.1)
Basophils Relative: 1 %
Eosinophils Absolute: 0 10*3/uL (ref 0.0–0.5)
Eosinophils Relative: 2 %
HCT: 29.9 % — ABNORMAL LOW (ref 36.0–46.0)
Hemoglobin: 9.4 g/dL — ABNORMAL LOW (ref 12.0–15.0)
Immature Granulocytes: 0 %
Lymphocytes Relative: 22 %
Lymphs Abs: 0.4 10*3/uL — ABNORMAL LOW (ref 0.7–4.0)
MCH: 34.6 pg — ABNORMAL HIGH (ref 26.0–34.0)
MCHC: 31.4 g/dL (ref 30.0–36.0)
MCV: 109.9 fL — ABNORMAL HIGH (ref 80.0–100.0)
Monocytes Absolute: 0.4 10*3/uL (ref 0.1–1.0)
Monocytes Relative: 18 %
Neutro Abs: 1.2 10*3/uL — ABNORMAL LOW (ref 1.7–7.7)
Neutrophils Relative %: 57 %
Platelets: 173 10*3/uL (ref 150–400)
RBC: 2.72 MIL/uL — ABNORMAL LOW (ref 3.87–5.11)
RDW: 17.9 % — ABNORMAL HIGH (ref 11.5–15.5)
WBC: 2 10*3/uL — ABNORMAL LOW (ref 4.0–10.5)
nRBC: 0 % (ref 0.0–0.2)

## 2020-01-07 LAB — COMPREHENSIVE METABOLIC PANEL
ALT: 12 U/L (ref 0–44)
AST: 16 U/L (ref 15–41)
Albumin: 3.6 g/dL (ref 3.5–5.0)
Alkaline Phosphatase: 86 U/L (ref 38–126)
Anion gap: 8 (ref 5–15)
BUN: 18 mg/dL (ref 8–23)
CO2: 24 mmol/L (ref 22–32)
Calcium: 9 mg/dL (ref 8.9–10.3)
Chloride: 111 mmol/L (ref 98–111)
Creatinine, Ser: 0.77 mg/dL (ref 0.44–1.00)
GFR calc Af Amer: >60 mL/min (ref >60)
GFR calc non Af Amer: >60 mL/min (ref >60)
Glucose, Bld: 92 mg/dL (ref 70–99)
Potassium: 4.2 mmol/L (ref 3.5–5.1)
Sodium: 143 mmol/L (ref 135–145)
Total Bilirubin: 0.3 mg/dL (ref 0.3–1.2)
Total Protein: 6 g/dL — ABNORMAL LOW (ref 6.5–8.1)

## 2020-01-08 NOTE — Progress Notes (Signed)
Your procedure is scheduled on Thursday July 15.  Report to [facility] Main Entrance "A" at 07:00 A.M., and check in at the Admitting office.  Call this number if you have problems the morning of surgery: [phone]  Call [phone] if you have any questions prior to your surgery date Monday-Friday 8am-4pm   Remember: Do not eat after midnight the night before your surgery  You may drink clear liquids until 06:00 A.M. the morning of your surgery.   Clear liquids allowed are: Water, Non-Citrus Juices (without pulp), Carbonated Beverages, Clear Tea, Black Coffee Only, and Gatorade   Take these medicines the morning of surgery with A SIP OF WATER:  atorvastatin (LIPITOR)  loratadine (CLARITIN)  IF NEEDED: acetaminophen (TYLENOL)  famotidine (PEPCID)  prochlorperazine (COMPAZINE)   Follow your surgeon's instructions on when to stop Aspirin.  If no instructions were given by your surgeon then you will need to call the office to get those instructions.    As of today, STOP taking any Aspirin containing products, Aleve, Naproxen, Ibuprofen, Motrin, Advil, Goody's, BC's, all herbal medications, fish oil, and all vitamins.    The Morning of Surgery  Do not wear jewelry, make-up or nail polish.  Do not wear lotions, powders, or perfumes, or deodorant  Do not shave 48 hours prior to surgery.   Do not bring valuables to the hospital.  [facility] is not responsible for any belongings or valuables.  If you are a smoker, DO NOT Smoke 24 hours prior to surgery  If you wear a CPAP at night please bring your mask the morning of surgery   Remember that you must have someone to transport you home after your surgery, and remain with you for 24 hours if you are discharged the same day.   Please bring cases for contacts, glasses, hearing aids, dentures or bridgework because it cannot be worn into surgery.    Leave your suitcase in the car.  After surgery it may be brought to your  room.  For patients admitted to the hospital, discharge time will be determined by your treatment team.  Patients discharged the day of surgery will not be allowed to drive home.    Special instructions:   - Preparing For Surgery  Before surgery, you can play an important role. Because skin is not sterile, your skin needs to be as free of germs as possible. You can reduce the number of germs on your skin by washing with CHG (chlorahexidine gluconate) Soap before surgery.  CHG is an antiseptic cleaner which kills germs and bonds with the skin to continue killing germs even after washing.    Oral Hygiene is also important to reduce your risk of infection.  Remember - BRUSH YOUR TEETH THE MORNING OF SURGERY WITH YOUR REGULAR TOOTHPASTE  Please do not use if you have an allergy to CHG or antibacterial soaps. If your skin becomes reddened/irritated stop using the CHG.  Do not shave (including legs and underarms) for at least 48 hours prior to first CHG shower. It is OK to shave your face.  Please follow these instructions carefully.   1. Shower the NIGHT BEFORE SURGERY and the MORNING OF SURGERY with CHG Soap.   2. If you chose to wash your hair and body, wash as usual with your normal shampoo and body-wash/soap.  3. Rinse your hair and body thoroughly to remove the shampoo and soap.  4. Apply CHG directly to the skin (ONLY FROM THE NECK DOWN)  and wash gently with a scrungie or a clean washcloth.   5. Do not use on open wounds or open sores. Avoid contact with your eyes, ears, mouth and genitals (private parts). Wash Face and genitals (private parts)  with your normal soap.   6. Wash thoroughly, paying special attention to the area where your surgery will be performed.  7. Thoroughly rinse your body with warm water from the neck down.  8. DO NOT shower/wash with your normal soap after using and rinsing off the CHG Soap.  9. Pat yourself dry with a CLEAN TOWEL.  10. Wear  CLEAN PAJAMAS to bed the night before surgery  11. Place CLEAN SHEETS on your bed the night of your first shower and DO NOT SLEEP WITH PETS.  12. Wear comfortable clothes the morning of surgery.     Day of Surgery:  Please shower the morning of surgery with the CHG soap Do not apply any deodorants/lotions. Please wear clean clothes to the hospital/surgery center.   Remember to brush your teeth WITH YOUR REGULAR TOOTHPASTE.   Please read over the following fact sheets that you were given.

## 2020-01-09 ENCOUNTER — Encounter (HOSPITAL_COMMUNITY): Payer: Self-pay

## 2020-01-09 ENCOUNTER — Other Ambulatory Visit: Payer: Self-pay

## 2020-01-09 ENCOUNTER — Encounter (HOSPITAL_COMMUNITY)
Admission: RE | Admit: 2020-01-09 | Discharge: 2020-01-09 | Disposition: A | Discharge status: Home / Self Care | Payer: Medicare Other | Source: Ambulatory Visit | Attending: Surgery | Admitting: Surgery

## 2020-01-09 ENCOUNTER — Encounter: Payer: Self-pay | Admitting: *Deleted

## 2020-01-09 DIAGNOSIS — Z01818 Encounter for other preprocedural examination: Secondary | ICD-10-CM | POA: Diagnosis not present

## 2020-01-09 NOTE — Progress Notes (Addendum)
PCP - Dr. Shelbie Proctor Cardiologist - Denies  Chest x-ray - N/I EKG - N/I Stress Test - Denies  ECHO - 07/25/19 Cardiac Cath - Denies  Sleep Study - Denies  DM - Denies  Aspirin Instructions: Stopped 01/08/20 Will call Dr. Georgette Dover for clarification on if this was the right time to stop.  Also patient will ask about pre op order for Fleets enema night prior to surgery.  ERAS Protcol -Instruction given  COVID TEST- 01/13/20  Anesthesia review: Yes WBC 2.0 / Hgb 9.4  Patient denies shortness of breath, fever, cough and chest pain at PAT appointment   All instructions explained to the patient, with a verbal understanding of the material. Patient agrees to go over the instructions while at home for a better understanding. Patient also instructed to self quarantine after being tested for COVID-19. The opportunity to ask questions was provided.

## 2020-01-10 NOTE — Progress Notes (Signed)
Anesthesia Chart Review:  Follows with oncologist 12/24/19 for left breast CA. She has been on weekly Paclitaxel and carboplatin however her last dose was held due to decreased blood counts. Preop labs notable for WBC 2.0 which is up from 1.5 on 12/24/19 and Hgb 9.4 which is up from 8.7 on 12/24/19. Remainder of labs unremarkable.   Echo for chemo monitoring Jan 2021 showed normal LVEF, grade 1dd, normal valves.   TTE 07/25/19: 1. Left ventricular ejection fraction, by visual estimation, is 60 to  65%. The left ventricle has normal function. There is mildly increased  left ventricular hypertrophy.  2. Left ventricular diastolic parameters are consistent with Grade I  diastolic dysfunction (impaired relaxation).  3. The left ventricle has no regional wall motion abnormalities.  4. Global right ventricle has normal systolic function.The right  ventricular size is normal. No increase in right ventricular wall  thickness.  5. Left atrial size was normal.  6. Right atrial size was normal.  7. The mitral valve is normal in structure. No evidence of mitral valve  regurgitation. No evidence of mitral stenosis.  8. The tricuspid valve is normal in structure.  9. The tricuspid valve is normal in structure. Tricuspid valve  regurgitation is not demonstrated.  10. The aortic valve is normal in structure. Aortic valve regurgitation is  trivial. No evidence of aortic valve sclerosis or stenosis.  11. The pulmonic valve was normal in structure. Pulmonic valve  regurgitation is not visualized.  12. The inferior vena cava is normal in size with greater than 50%  respiratory variability, suggesting right atrial pressure of 3 mmHg.  13. The average left ventricular global longitudinal strain is -14.8 %.    Wynonia Musty [facility] Stay [facility]/Anesthesiology Phone [phone] 01/10/2020 3:26 PM

## 2020-01-10 NOTE — Anesthesia Preprocedure Evaluation (Addendum)
Anesthesia Evaluation  Patient identified by MRN, date of birth, ID band Patient awake    Reviewed: Allergy & Precautions, NPO status , Patient's Chart, lab work & pertinent test results  History of Anesthesia Complications Negative for: history of anesthetic complications  Airway Mallampati: III  TM Distance: >3 FB Neck ROM: Full    Dental no notable dental hx.    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  Reproductive/Obstetrics negative OB ROS                            Anesthesia Physical Anesthesia Plan  ASA: III  Anesthesia Plan: General   Post-op Pain Management: GA combined w/ Regional for post-op pain   Induction: Intravenous  PONV Risk Score and Plan: 3 and Treatment may vary due to age or medical condition, Ondansetron and Dexamethasone  Airway Management Planned: LMA  Additional Equipment: None  Intra-op Plan:   Post-operative Plan: Extubation in OR  Informed Consent: I have reviewed the patients History and Physical, chart, labs and discussed the procedure including the risks, benefits and alternatives for the proposed anesthesia with the patient or authorized representative who has indicated his/her understanding and acceptance.     Dental advisory given  Plan Discussed with: CRNA  Anesthesia Plan Comments: (PAT note by Karoline Caldwell, PA-C: Follows with oncologist 12/24/19 for left breast  CA. She has been on weekly Paclitaxel and carboplatin however her last dose was held due to decreased blood counts. Preop labs notable for WBC 2.0 which is up from 1.5 on 12/24/19 and Hgb 9.4 which is up from 8.7 on 12/24/19. Remainder of labs unremarkable.   Echo for chemo monitoring Jan 2021 showed normal LVEF, grade 1dd, normal valves.   TTE 07/25/19: 1. Left ventricular ejection fraction, by visual estimation, is 60 to  65%. The left ventricle has normal function. There is mildly increased  left ventricular hypertrophy.  2. Left ventricular diastolic parameters are consistent with Grade I  diastolic dysfunction (impaired relaxation).  3. The left ventricle has no regional wall motion abnormalities.  4. Global right ventricle has normal systolic function.The right  ventricular size is normal. No increase in right ventricular wall  thickness.  5. Left atrial size was normal.  6. Right atrial size was normal.  7. The mitral valve is normal in structure. No evidence of mitral valve  regurgitation. No evidence of mitral stenosis.  8. The tricuspid valve is normal in structure.  9. The tricuspid valve is normal in structure. Tricuspid valve  regurgitation is not demonstrated.  10. The aortic valve is normal in structure. Aortic valve regurgitation is  trivial. No evidence of aortic valve sclerosis or stenosis.  11. The pulmonic valve was normal in structure. Pulmonic valve  regurgitation is not visualized.  12. The inferior vena cava is normal in size with greater than 50%  respiratory variability, suggesting right atrial pressure of 3 mmHg.  13. The average left ventricular global  longitudinal strain is -14.8 %. )      Anesthesia Quick Evaluation

## 2020-01-13 ENCOUNTER — Other Ambulatory Visit (HOSPITAL_COMMUNITY)
Admission: RE | Admit: 2020-01-13 | Discharge: 2020-01-13 | Disposition: A | Discharge status: Home / Self Care | Payer: Medicare Other | Source: Ambulatory Visit | Attending: Surgery | Admitting: Surgery

## 2020-01-13 DIAGNOSIS — Z20822 Contact with and (suspected) exposure to covid-19: Secondary | ICD-10-CM | POA: Diagnosis not present

## 2020-01-13 DIAGNOSIS — Z01812 Encounter for preprocedural laboratory examination: Secondary | ICD-10-CM | POA: Insufficient documentation

## 2020-01-13 LAB — SARS CORONAVIRUS 2 (TAT 6-24 HRS): SARS Coronavirus 2: NEGATIVE

## 2020-01-15 ENCOUNTER — Ambulatory Visit
Admission: RE | Admit: 2020-01-15 | Discharge: 2020-01-15 | Disposition: A | Discharge status: Home / Self Care | Payer: Medicare Other | Source: Ambulatory Visit | Attending: Surgery | Admitting: Surgery

## 2020-01-15 ENCOUNTER — Other Ambulatory Visit: Payer: Self-pay

## 2020-01-15 ENCOUNTER — Other Ambulatory Visit: Payer: Self-pay | Admitting: Surgery

## 2020-01-15 DIAGNOSIS — C50912 Malignant neoplasm of unspecified site of left female breast: Secondary | ICD-10-CM

## 2020-01-15 DIAGNOSIS — C773 Secondary and unspecified malignant neoplasm of axilla and upper limb lymph nodes: Secondary | ICD-10-CM | POA: Diagnosis not present

## 2020-01-16 ENCOUNTER — Encounter (HOSPITAL_COMMUNITY)
Admission: RE | Disposition: A | Discharge status: Home / Self Care | Payer: Self-pay | Source: Home / Self Care | Attending: Surgery

## 2020-01-16 ENCOUNTER — Ambulatory Visit
Admission: RE | Admit: 2020-01-16 | Discharge: 2020-01-16 | Disposition: A | Discharge status: Home / Self Care | Payer: Medicare Other | Source: Ambulatory Visit | Attending: Surgery | Admitting: Surgery

## 2020-01-16 ENCOUNTER — Ambulatory Visit (HOSPITAL_COMMUNITY)
Admission: RE | Admit: 2020-01-16 | Discharge: 2020-01-16 | Disposition: A | Discharge status: Home / Self Care | Payer: Medicare Other | Attending: Surgery | Admitting: Surgery

## 2020-01-16 ENCOUNTER — Ambulatory Visit (HOSPITAL_COMMUNITY): Payer: Medicare Other | Admitting: Anesthesiology

## 2020-01-16 ENCOUNTER — Encounter (HOSPITAL_COMMUNITY): Payer: Self-pay | Admitting: Surgery

## 2020-01-16 ENCOUNTER — Other Ambulatory Visit: Payer: Self-pay

## 2020-01-16 ENCOUNTER — Ambulatory Visit (HOSPITAL_COMMUNITY): Payer: Medicare Other | Admitting: Physician Assistant

## 2020-01-16 DIAGNOSIS — C50912 Malignant neoplasm of unspecified site of left female breast: Secondary | ICD-10-CM

## 2020-01-16 DIAGNOSIS — Z9221 Personal history of antineoplastic chemotherapy: Secondary | ICD-10-CM | POA: Diagnosis not present

## 2020-01-16 DIAGNOSIS — Z79899 Other long term (current) drug therapy: Secondary | ICD-10-CM | POA: Diagnosis not present

## 2020-01-16 DIAGNOSIS — K449 Diaphragmatic hernia without obstruction or gangrene: Secondary | ICD-10-CM | POA: Insufficient documentation

## 2020-01-16 DIAGNOSIS — Z171 Estrogen receptor negative status [ER-]: Secondary | ICD-10-CM | POA: Insufficient documentation

## 2020-01-16 DIAGNOSIS — M1990 Unspecified osteoarthritis, unspecified site: Secondary | ICD-10-CM | POA: Diagnosis not present

## 2020-01-16 DIAGNOSIS — Z7982 Long term (current) use of aspirin: Secondary | ICD-10-CM | POA: Insufficient documentation

## 2020-01-16 DIAGNOSIS — C50512 Malignant neoplasm of lower-outer quadrant of left female breast: Secondary | ICD-10-CM | POA: Insufficient documentation

## 2020-01-16 DIAGNOSIS — K219 Gastro-esophageal reflux disease without esophagitis: Secondary | ICD-10-CM | POA: Diagnosis not present

## 2020-01-16 DIAGNOSIS — G8918 Other acute postprocedural pain: Secondary | ICD-10-CM | POA: Diagnosis not present

## 2020-01-16 DIAGNOSIS — C773 Secondary and unspecified malignant neoplasm of axilla and upper limb lymph nodes: Secondary | ICD-10-CM | POA: Insufficient documentation

## 2020-01-16 DIAGNOSIS — E039 Hypothyroidism, unspecified: Secondary | ICD-10-CM | POA: Insufficient documentation

## 2020-01-16 DIAGNOSIS — N6092 Unspecified benign mammary dysplasia of left breast: Secondary | ICD-10-CM | POA: Diagnosis not present

## 2020-01-16 DIAGNOSIS — Z882 Allergy status to sulfonamides status: Secondary | ICD-10-CM | POA: Insufficient documentation

## 2020-01-16 DIAGNOSIS — D0512 Intraductal carcinoma in situ of left breast: Secondary | ICD-10-CM | POA: Diagnosis not present

## 2020-01-16 DIAGNOSIS — N6012 Diffuse cystic mastopathy of left breast: Secondary | ICD-10-CM | POA: Diagnosis not present

## 2020-01-16 DIAGNOSIS — Z880 Allergy status to penicillin: Secondary | ICD-10-CM | POA: Insufficient documentation

## 2020-01-16 DIAGNOSIS — J45909 Unspecified asthma, uncomplicated: Secondary | ICD-10-CM | POA: Diagnosis not present

## 2020-01-16 HISTORY — PX: BREAST LUMPECTOMY: SHX2

## 2020-01-16 HISTORY — PX: PORT-A-CATH REMOVAL: SHX5289

## 2020-01-16 HISTORY — PX: BREAST LUMPECTOMY WITH RADIOACTIVE SEED AND AXILLARY LYMPH NODE DISSECTION: SHX6656

## 2020-01-16 SURGERY — BREAST LUMPECTOMY WITH RADIOACTIVE SEED AND AXILLARY LYMPH NODE DISSECTION
Anesthesia: General | Site: Chest

## 2020-01-16 MED ORDER — LACTATED RINGERS IV SOLN: INTRAVENOUS | Status: DC

## 2020-01-16 MED ORDER — MIDAZOLAM HCL 2 MG/2ML IJ SOLN
INTRAMUSCULAR | Status: AC
  Filled 2020-01-16: qty 2

## 2020-01-16 MED ORDER — DEXAMETHASONE SODIUM PHOSPHATE 10 MG/ML IJ SOLN
INTRAMUSCULAR | Status: DC | PRN
  Administered 2020-01-16: 4 mg via INTRAVENOUS

## 2020-01-16 MED ORDER — CHLORHEXIDINE GLUCONATE 0.12 % MT SOLN
OROMUCOSAL | Status: AC
  Administered 2020-01-16: 15 mL via OROMUCOSAL
  Filled 2020-01-16: qty 15

## 2020-01-16 MED ORDER — PROPOFOL 10 MG/ML IV BOLUS
INTRAVENOUS | Status: DC | PRN
  Administered 2020-01-16: 150 mg via INTRAVENOUS

## 2020-01-16 MED ORDER — PROPOFOL 10 MG/ML IV BOLUS
INTRAVENOUS | Status: AC
  Filled 2020-01-16: qty 20

## 2020-01-16 MED ORDER — CHLORHEXIDINE GLUCONATE CLOTH 2 % EX PADS: 6.0000 | MEDICATED_PAD | Freq: Once | CUTANEOUS | Status: DC

## 2020-01-16 MED ORDER — FENTANYL CITRATE (PF) 250 MCG/5ML IJ SOLN
INTRAMUSCULAR | Status: AC
  Filled 2020-01-16: qty 5

## 2020-01-16 MED ORDER — ACETAMINOPHEN 500 MG PO TABS
1000.0000 mg | ORAL_TABLET | Freq: Once | ORAL | Status: AC
  Administered 2020-01-16: 1000 mg via ORAL
  Filled 2020-01-16: qty 2

## 2020-01-16 MED ORDER — FENTANYL CITRATE (PF) 100 MCG/2ML IJ SOLN
INTRAMUSCULAR | Status: AC
  Administered 2020-01-16: 50 ug via INTRAVENOUS
  Filled 2020-01-16: qty 2

## 2020-01-16 MED ORDER — FENTANYL CITRATE (PF) 100 MCG/2ML IJ SOLN: 50.0000 ug | Freq: Once | INTRAMUSCULAR | Status: AC

## 2020-01-16 MED ORDER — FENTANYL CITRATE (PF) 100 MCG/2ML IJ SOLN
INTRAMUSCULAR | Status: DC | PRN
  Administered 2020-01-16 (×2): 50 ug via INTRAVENOUS

## 2020-01-16 MED ORDER — PROMETHAZINE HCL 25 MG/ML IJ SOLN: 6.2500 mg | INTRAMUSCULAR | Status: DC | PRN

## 2020-01-16 MED ORDER — CHLORHEXIDINE GLUCONATE 0.12 % MT SOLN: 15.0000 mL | Freq: Once | OROMUCOSAL | Status: AC

## 2020-01-16 MED ORDER — FENTANYL CITRATE (PF) 100 MCG/2ML IJ SOLN: 25.0000 ug | INTRAMUSCULAR | Status: DC | PRN

## 2020-01-16 MED ORDER — OXYCODONE HCL 5 MG PO TABS: 5.0000 mg | ORAL_TABLET | Freq: Once | ORAL | Status: DC | PRN

## 2020-01-16 MED ORDER — ORAL CARE MOUTH RINSE: 15.0000 mL | Freq: Once | OROMUCOSAL | Status: AC

## 2020-01-16 MED ORDER — HYDROCODONE-ACETAMINOPHEN 5-325 MG PO TABS
1.0000 | ORAL_TABLET | Freq: Four times a day (QID) | ORAL | 0 refills | Status: DC | PRN
Start: 2020-01-16 — End: 2020-01-24

## 2020-01-16 MED ORDER — CEFAZOLIN SODIUM-DEXTROSE 2-4 GM/100ML-% IV SOLN
INTRAVENOUS | Status: AC
  Filled 2020-01-16: qty 100

## 2020-01-16 MED ORDER — EPHEDRINE SULFATE-NACL 50-0.9 MG/10ML-% IV SOSY
PREFILLED_SYRINGE | INTRAVENOUS | Status: DC | PRN
  Administered 2020-01-16 (×2): 10 mg via INTRAVENOUS
  Administered 2020-01-16: 5 mg via INTRAVENOUS
  Administered 2020-01-16: 10 mg via INTRAVENOUS

## 2020-01-16 MED ORDER — DEXMEDETOMIDINE (PRECEDEX) IN NS 20 MCG/5ML (4 MCG/ML) IV SYRINGE
PREFILLED_SYRINGE | INTRAVENOUS | Status: AC
  Filled 2020-01-16: qty 5

## 2020-01-16 MED ORDER — ONDANSETRON HCL 4 MG/2ML IJ SOLN
INTRAMUSCULAR | Status: DC | PRN
  Administered 2020-01-16: 4 mg via INTRAVENOUS

## 2020-01-16 MED ORDER — 0.9 % SODIUM CHLORIDE (POUR BTL) OPTIME
TOPICAL | Status: DC | PRN
  Administered 2020-01-16: 1000 mL

## 2020-01-16 MED ORDER — BUPIVACAINE HCL (PF) 0.25 % IJ SOLN
INTRAMUSCULAR | Status: DC | PRN
  Administered 2020-01-16: 23 mL

## 2020-01-16 MED ORDER — CEFAZOLIN SODIUM-DEXTROSE 2-4 GM/100ML-% IV SOLN
2.0000 g | INTRAVENOUS | Status: AC
  Administered 2020-01-16: 2 g via INTRAVENOUS

## 2020-01-16 MED ORDER — BUPIVACAINE-EPINEPHRINE (PF) 0.5% -1:200000 IJ SOLN
INTRAMUSCULAR | Status: DC | PRN
  Administered 2020-01-16: 20 mL via PERINEURAL

## 2020-01-16 MED ORDER — OXYCODONE HCL 5 MG/5ML PO SOLN: 5.0000 mg | Freq: Once | ORAL | Status: DC | PRN

## 2020-01-16 MED ORDER — LIDOCAINE 2% (20 MG/ML) 5 ML SYRINGE
INTRAMUSCULAR | Status: DC | PRN
  Administered 2020-01-16: 80 mg via INTRAVENOUS

## 2020-01-16 MED ORDER — BUPIVACAINE HCL (PF) 0.25 % IJ SOLN
INTRAMUSCULAR | Status: AC
  Filled 2020-01-16: qty 30

## 2020-01-16 MED ORDER — BUPIVACAINE LIPOSOME 1.3 % IJ SUSP
INTRAMUSCULAR | Status: DC | PRN
Start: 2020-01-16 — End: 2020-01-16
  Administered 2020-01-16: 10 mL via PERINEURAL

## 2020-01-16 MED ORDER — GABAPENTIN 300 MG PO CAPS
300.0000 mg | ORAL_CAPSULE | ORAL | Status: AC
  Administered 2020-01-16: 300 mg via ORAL
  Filled 2020-01-16: qty 1

## 2020-01-16 SURGICAL SUPPLY — 56 items
APPLIER CLIP 9.375 MED OPEN (MISCELLANEOUS) ×6
BENZOIN TINCTURE PRP APPL 2/3 (GAUZE/BANDAGES/DRESSINGS) ×6 IMPLANT
BINDER BREAST LRG (GAUZE/BANDAGES/DRESSINGS) IMPLANT
BINDER BREAST XLRG (GAUZE/BANDAGES/DRESSINGS) IMPLANT
BLADE 15 SAFETY STRL DISP (BLADE) ×3 IMPLANT
CANISTER SUCT 3000ML PPV (MISCELLANEOUS) ×3 IMPLANT
CHLORAPREP W/TINT 26 (MISCELLANEOUS) ×3 IMPLANT
CLIP APPLIE 9.375 MED OPEN (MISCELLANEOUS) ×4 IMPLANT
CLSR STERI-STRIP ANTIMIC 1/2X4 (GAUZE/BANDAGES/DRESSINGS) ×6 IMPLANT
CNTNR URN SCR LID CUP LEK RST (MISCELLANEOUS) ×2 IMPLANT
CONT SPEC 4OZ STRL OR WHT (MISCELLANEOUS) ×3
COVER PROBE W GEL 5X96 (DRAPES) ×3 IMPLANT
COVER SURGICAL LIGHT HANDLE (MISCELLANEOUS) ×6 IMPLANT
COVER WAND RF STERILE (DRAPES) ×6 IMPLANT
DECANTER SPIKE VIAL GLASS SM (MISCELLANEOUS) ×3 IMPLANT
DEVICE DUBIN SPECIMEN MAMMOGRA (MISCELLANEOUS) ×3 IMPLANT
DRAPE CHEST BREAST 15X10 FENES (DRAPES) ×3 IMPLANT
DRSG TEGADERM 2-3/8X2-3/4 SM (GAUZE/BANDAGES/DRESSINGS) ×3 IMPLANT
DRSG TEGADERM 4X4.5 CHG (GAUZE/BANDAGES/DRESSINGS) ×3 IMPLANT
DRSG TEGADERM 4X4.75 (GAUZE/BANDAGES/DRESSINGS) ×3 IMPLANT
ELECT BLADE 4.0 EZ CLEAN MEGAD (MISCELLANEOUS) ×3
ELECT CAUTERY BLADE 6.4 (BLADE) ×3 IMPLANT
ELECT REM PT RETURN 9FT ADLT (ELECTROSURGICAL) ×6
ELECTRODE BLDE 4.0 EZ CLN MEGD (MISCELLANEOUS) ×2 IMPLANT
ELECTRODE REM PT RTRN 9FT ADLT (ELECTROSURGICAL) ×4 IMPLANT
GAUZE SPONGE 2X2 8PLY STRL LF (GAUZE/BANDAGES/DRESSINGS) ×4 IMPLANT
GLOVE BIO SURGEON STRL SZ 6 (GLOVE) ×3 IMPLANT
GLOVE BIO SURGEON STRL SZ7 (GLOVE) ×9 IMPLANT
GLOVE BIOGEL PI IND STRL 7.5 (GLOVE) ×4 IMPLANT
GLOVE BIOGEL PI INDICATOR 7.5 (GLOVE) ×2
GOWN STRL REUS W/ TWL LRG LVL3 (GOWN DISPOSABLE) ×8 IMPLANT
GOWN STRL REUS W/TWL LRG LVL3 (GOWN DISPOSABLE) ×12
KIT BASIN OR (CUSTOM PROCEDURE TRAY) ×6 IMPLANT
KIT MARKER MARGIN INK (KITS) IMPLANT
KIT TURNOVER KIT B (KITS) ×3 IMPLANT
LIGHT WAVEGUIDE WIDE FLAT (MISCELLANEOUS) IMPLANT
NEEDLE 18GX1X1/2 (RX/OR ONLY) (NEEDLE) ×3 IMPLANT
NEEDLE FILTER BLUNT 18X 1/2SAF (NEEDLE) ×1
NEEDLE FILTER BLUNT 18X1 1/2 (NEEDLE) ×2 IMPLANT
NEEDLE HYPO 25GX1X1/2 BEV (NEEDLE) ×12 IMPLANT
NS IRRIG 1000ML POUR BTL (IV SOLUTION) ×3 IMPLANT
PACK GENERAL/GYN (CUSTOM PROCEDURE TRAY) ×6 IMPLANT
PAD ARMBOARD 7.5X6 YLW CONV (MISCELLANEOUS) ×6 IMPLANT
PENCIL SMOKE EVACUATOR (MISCELLANEOUS) ×3 IMPLANT
SPONGE GAUZE 2X2 8PLY STRL LF (GAUZE/BANDAGES/DRESSINGS) ×3 IMPLANT
SPONGE GAUZE 2X2 STER 10/PKG (GAUZE/BANDAGES/DRESSINGS) ×2
SPONGE LAP 4X18 RFD (DISPOSABLE) ×6 IMPLANT
STRIP CLOSURE SKIN 1/2X4 (GAUZE/BANDAGES/DRESSINGS) ×6 IMPLANT
SUT MNCRL AB 4-0 PS2 18 (SUTURE) ×3 IMPLANT
SUT MON AB 4-0 PC3 18 (SUTURE) ×3 IMPLANT
SUT VIC AB 3-0 SH 27 (SUTURE) ×12
SUT VIC AB 3-0 SH 27X BRD (SUTURE) ×6 IMPLANT
SUT VIC AB 3-0 SH 27XBRD (SUTURE) ×2 IMPLANT
SYR CONTROL 10ML LL (SYRINGE) ×3 IMPLANT
TOWEL GREEN STERILE (TOWEL DISPOSABLE) ×6 IMPLANT
TOWEL GREEN STERILE FF (TOWEL DISPOSABLE) ×6 IMPLANT

## 2020-01-16 NOTE — Interval H&P Note (Signed)
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  The various methods of treatment have been discussed with the patient and family. After consideration of risks, benefits and other options for treatment, the patient has consented to  Procedure(s) with comments: LEFT BREAST LUMPECTOMY WITH RADIOACTIVE SEED AND TARGETED AXILLARY LYMPH NODE DISSECTION (Left) - LMA REMOVAL PORT-A-CATH (N/A) as a surgical intervention.  The patient's history has been reviewed, patient examined, no change in status, stable for surgery.  I have reviewed the patient's chart and labs.  Questions were answered to the patient's satisfaction.     Julie Thompson

## 2020-01-16 NOTE — Anesthesia Postprocedure Evaluation (Signed)
Anesthesia Post Note  Patient: Julie Thompson  Procedure(s) Performed: LEFT BREAST LUMPECTOMY WITH RADIOACTIVE SEED AND TARGETED AXILLARY LYMPH NODE DISSECTION (Left Breast) REMOVAL PORT-A-CATH (N/A Chest)     Patient location during evaluation: PACU Anesthesia Type: General Level of consciousness: awake and alert and oriented Pain management: pain level controlled Vital Signs Assessment: post-procedure vital signs reviewed and stable Respiratory status: spontaneous breathing, nonlabored ventilation and respiratory function stable Cardiovascular status: blood pressure returned to baseline Postop Assessment: no apparent nausea or vomiting Anesthetic complications: no   No complications documented.  Last Vitals:  Vitals:   01/16/20 1056 01/16/20 1111  BP: (!) 148/69 138/76  Pulse: 90 89  Resp: 14 14  Temp:  (!) 36.3 C  SpO2: 93% 93%    Last Pain:  Vitals:   01/16/20 1111  TempSrc:   PainSc: 0-No pain                 Brennan Bailey

## 2020-01-16 NOTE — Anesthesia Procedure Notes (Signed)
Anesthesia Regional Block: Pectoralis block   Pre-Anesthetic Checklist: ,, timeout performed, Correct Patient, Correct Site, Correct Laterality, Correct Procedure, Correct Position, site marked, Risks and benefits discussed, pre-op evaluation,  At surgeon's request and post-op pain management  Laterality: Left  Prep: Maximum Sterile Barrier Precautions used, chloraprep       Needles:  Injection technique: Single-shot  Needle Type: Echogenic Stimulator Needle     Needle Length: 9cm  Needle Gauge: 22     Additional Needles:   Procedures:,,,, ultrasound used (permanent image in chart),,,,  Narrative:  Start time: 01/16/2020 8:40 AM End time: 01/16/2020 8:43 AM Injection made incrementally with aspirations every 5 mL.  Performed by: Personally  Anesthesiologist: Brennan Bailey, MD  Additional Notes: Risks, benefits, and alternative discussed. Patient gave consent for procedure. Patient prepped and draped in sterile fashion. Sedation administered, patient remains easily responsive to voice. Relevant anatomy identified with ultrasound guidance. Local anesthetic given in 5cc increments with no signs or symptoms of intravascular injection. No pain or paraesthesias with injection. Patient monitored throughout procedure with signs of LAST or immediate complications. Tolerated well. Ultrasound image placed in chart.  Tawny Asal, MD

## 2020-01-16 NOTE — Discharge Instructions (Signed)
[facility] °Office Phone Number [phone] ° °BREAST BIOPSY/ PARTIAL MASTECTOMY: POST OP INSTRUCTIONS ° °Always review your discharge instruction sheet given to you by the facility where your surgery was performed. ° °IF YOU HAVE DISABILITY OR FAMILY LEAVE FORMS, YOU MUST BRING THEM TO THE OFFICE FOR PROCESSING.  DO NOT GIVE THEM TO YOUR DOCTOR. ° °1. A prescription for pain medication may be given to you upon discharge.  Take your pain medication as prescribed, if needed.  If narcotic pain medicine is not needed, then you may take acetaminophen (Tylenol) or ibuprofen (Advil) as needed. °2. Take your usually prescribed medications unless otherwise directed °3. If you need a refill on your pain medication, please contact your pharmacy.  They will contact our office to request authorization.  Prescriptions will not be filled after 5pm or on week-ends. °4. You should eat very light the first 24 hours after surgery, such as soup, crackers, pudding, etc.  Resume your normal diet the day after surgery. °5. Most patients will experience some swelling and bruising in the breast.  Ice packs and a good support bra will help.  Swelling and bruising can take several days to resolve.  °6. It is common to experience some constipation if taking pain medication after surgery.  Increasing fluid intake and taking a stool softener will usually help or prevent this problem from occurring.  A mild laxative (Milk of Magnesia or Miralax) should be taken according to package directions if there are no bowel movements after 48 hours. °7. Unless discharge instructions indicate otherwise, you may remove your bandages 24-48 hours after surgery, and you may shower at that time.  You may have steri-strips (small skin tapes) in place directly over the incision.  These strips should be left on the skin for 7-10 days.  If your surgeon used skin glue on the incision, you may shower in 24 hours.  The glue will flake off over the  next 2-3 weeks.  Any sutures or staples will be removed at the office during your follow-up visit. °8. ACTIVITIES:  You may resume regular daily activities (gradually increasing) beginning the next day.  Wearing a good support bra or sports bra minimizes pain and swelling.  You may have sexual intercourse when it is comfortable. °a. You may drive when you no longer are taking prescription pain medication, you can comfortably wear a seatbelt, and you can safely maneuver your car and apply brakes. °b. RETURN TO WORK:  ______________________________________________________________________________________ °9. You should see your doctor in the office for a follow-up appointment approximately two weeks after your surgery.  Your doctor’s nurse will typically make your follow-up appointment when she calls you with your pathology report.  Expect your pathology report 2-3 business days after your surgery.  You may call to check if you do not hear from us after three days. °10. OTHER INSTRUCTIONS: _______________________________________________________________________________________________ _____________________________________________________________________________________________________________________________________ °_____________________________________________________________________________________________________________________________________ °_____________________________________________________________________________________________________________________________________ ° °WHEN TO CALL YOUR DOCTOR: °1. Fever over 101.0 °2. Nausea and/or vomiting. °3. Extreme swelling or bruising. °4. Continued bleeding from incision. °5. Increased pain, redness, or drainage from the incision. ° °The clinic staff is available to answer your questions during regular business hours.  Please don’t hesitate to call and ask to speak to one of the nurses for clinical concerns.  If you have a medical emergency, go to the nearest  emergency room or call 911.  A surgeon from [facility] is always on call at the hospital. ° °For further questions, please visit [email or web address]  °

## 2020-01-16 NOTE — Anesthesia Procedure Notes (Signed)
Procedure Name: LMA Insertion Date/Time: 01/16/2020 9:00 AM Performed by: Leonor Liv, CRNA Pre-anesthesia Checklist: Patient identified, Emergency Drugs available, Suction available and Patient being monitored Patient Re-evaluated:Patient Re-evaluated prior to induction Oxygen Delivery Method: Circle System Utilized Preoxygenation: Pre-oxygenation with 100% oxygen Induction Type: IV induction Ventilation: Mask ventilation without difficulty LMA: LMA inserted LMA Size: 4.0 Number of attempts: 1 Airway Equipment and Method: Bite block Placement Confirmation: positive ETCO2 Tube secured with: Tape Dental Injury: Teeth and Oropharynx as per pre-operative assessment

## 2020-01-16 NOTE — Op Note (Signed)
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 Anesthesia:  GEN - LMA Indications: This is a 74 year old female who presented in January with a 4 cm left breast cancer with metastases to the axillary lymph nodes.  This cancer is triple negative.  She received neoadjuvant chemotherapy.  She has had significant improvement in the appearance of her cancer on MRI.  The metastatic lymph node now appears normal.  Her pre-chemotherapy MRI showed additional lesions in the right breast as well as in the lateral left breast.  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  The follow-up MRI showed no enhancement in either one of these areas.  I made the decision not to add more procedures to her already lengthy procedure, so we will not excise these 2 areas.  These areas will be followed.  Description of procedure: The patient is brought to the operating room placed in supine position on the operating room table. After an adequate level of general anesthesia was obtained, her chestwas prepped with ChloraPrep and draped in sterile fashion. A timeout was taken to ensure the proper patient and proper procedure. We interrogated the left breast with the neoprobe. We made a circumareolar incision around the lower side of the nipple after infiltrating with 0.25% Marcaine. Dissection was carried down in the breast tissue with cautery. We used the neoprobe to guide [place] towards the radioactive seed. We excised an area of tissue around the radioactive seed 2.5 cm in diameter.  We continued our dissection past the radioactive seed almost to the chest wall because of the previous posterior portion of the dumbbell-shaped cancer. The specimen was removed and was oriented with a paint kit.  Specimen mammogram showed the radioactive seed as well as several biopsy clips within the specimen. This was sent for pathologic examination. There is no residual radioactivity within the biopsy cavity. We inspected carefully for hemostasis. The wound was thoroughly irrigated.   We then turned our attention to the left axilla.  There is an area of activity in the anterior axilla that was detected with the neoprobe.  I made an incision over this area after infiltrating with local anesthetic.  We dissected down to the axilla.  We identified an area of activity.  I excised a cluster of lymph nodes in the axilla.  The radioactive seed was removed separately from the surface of the lymph node dissection specimen.  No other palpable nodes are noted in this area.  There is no residual radioactivity.  We inspected for hemostasis.  The wound was irrigated.  Both wounds were closed with a deep layer of 3-0 Vicryl and a subcuticular layer of 4-0 Monocryl.   We then turned our attention to the right chest wall.  I infiltrated the area over the port with local anesthetic.  A transverse incision was made.  We dissected down to the port with cautery.  The 2 stay sutures were removed.  The port was removed and we held pressure for several minutes for hemostasis.  I excised part of the capsule.  The wound was closed with a deep layer of 3-0 Vicryl in a subcuticular layer 4-0 Monocryl.  Benzoin Steri-Strips were applied to all of the incisions. The patient was then extubated and brought to the recovery room in stable condition. All sponge, instrument, and needle counts are correct.  Imogene Burn. Georgette Dover, MD, [facility]  General/ Trauma Surgery  01/16/2020 10:31 AM

## 2020-01-16 NOTE — Transfer of Care (Signed)
Immediate Anesthesia Transfer of Care Note  Patient: Julie Thompson  Procedure(s) Performed: LEFT BREAST LUMPECTOMY WITH RADIOACTIVE SEED AND TARGETED AXILLARY LYMPH NODE DISSECTION (Left Breast) REMOVAL PORT-A-CATH (N/A Chest)  Patient Location: PACU  Anesthesia Type:General  Level of Consciousness: awake, alert  and oriented  Airway & Oxygen Therapy: Patient Spontanous Breathing and Patient connected to nasal cannula oxygen  Post-op Assessment: Report given to RN, Post -op Vital signs reviewed and stable and Patient moving all extremities  Post vital signs: Reviewed and stable  Last Vitals:  Vitals Value Taken Time  BP 148/73 01/16/20 1044  Temp 36.4 C 01/16/20 1041  Pulse 89 01/16/20 1047  Resp 16 01/16/20 1047  SpO2 90 % 01/16/20 1047  Vitals shown include unvalidated device data.  Last Pain:  Vitals:   01/16/20 1041  TempSrc:   PainSc: 0-No pain         Complications: No complications documented.

## 2020-01-16 NOTE — Addendum Note (Signed)
Addendum  created 01/16/20 1335 by Leonor Liv, CRNA   Charge Capture section accepted

## 2020-01-17 ENCOUNTER — Encounter (HOSPITAL_COMMUNITY): Payer: Self-pay | Admitting: Surgery

## 2020-01-20 ENCOUNTER — Encounter: Payer: Self-pay | Admitting: *Deleted

## 2020-01-22 LAB — SURGICAL PATHOLOGY

## 2020-01-24 ENCOUNTER — Inpatient Hospital Stay: Payer: Medicare Other

## 2020-01-24 ENCOUNTER — Other Ambulatory Visit: Payer: Self-pay

## 2020-01-24 ENCOUNTER — Inpatient Hospital Stay (HOSPITAL_BASED_OUTPATIENT_CLINIC_OR_DEPARTMENT_OTHER): Payer: Medicare Other | Admitting: Oncology

## 2020-01-24 ENCOUNTER — Telehealth: Payer: Self-pay | Admitting: Oncology

## 2020-01-24 VITALS — BP 119/76 | HR 77 | Temp 98.3°F | Resp 16 | Ht 60.0 in | Wt 186.1 lb

## 2020-01-24 DIAGNOSIS — Z833 Family history of diabetes mellitus: Secondary | ICD-10-CM | POA: Diagnosis not present

## 2020-01-24 DIAGNOSIS — C773 Secondary and unspecified malignant neoplasm of axilla and upper limb lymph nodes: Secondary | ICD-10-CM | POA: Diagnosis not present

## 2020-01-24 DIAGNOSIS — Z806 Family history of leukemia: Secondary | ICD-10-CM | POA: Diagnosis not present

## 2020-01-24 DIAGNOSIS — Z79899 Other long term (current) drug therapy: Secondary | ICD-10-CM | POA: Diagnosis not present

## 2020-01-24 DIAGNOSIS — C50512 Malignant neoplasm of lower-outer quadrant of left female breast: Secondary | ICD-10-CM

## 2020-01-24 DIAGNOSIS — Z9221 Personal history of antineoplastic chemotherapy: Secondary | ICD-10-CM | POA: Diagnosis not present

## 2020-01-24 DIAGNOSIS — Z8049 Family history of malignant neoplasm of other genital organs: Secondary | ICD-10-CM | POA: Diagnosis not present

## 2020-01-24 DIAGNOSIS — Z794 Long term (current) use of insulin: Secondary | ICD-10-CM | POA: Diagnosis not present

## 2020-01-24 DIAGNOSIS — E109 Type 1 diabetes mellitus without complications: Secondary | ICD-10-CM | POA: Diagnosis not present

## 2020-01-24 DIAGNOSIS — J45909 Unspecified asthma, uncomplicated: Secondary | ICD-10-CM | POA: Diagnosis not present

## 2020-01-24 DIAGNOSIS — Z800 Family history of malignant neoplasm of digestive organs: Secondary | ICD-10-CM | POA: Diagnosis not present

## 2020-01-24 DIAGNOSIS — Z803 Family history of malignant neoplasm of breast: Secondary | ICD-10-CM | POA: Diagnosis not present

## 2020-01-24 DIAGNOSIS — Z171 Estrogen receptor negative status [ER-]: Secondary | ICD-10-CM

## 2020-01-24 DIAGNOSIS — Z8051 Family history of malignant neoplasm of kidney: Secondary | ICD-10-CM | POA: Diagnosis not present

## 2020-01-24 DIAGNOSIS — Z7982 Long term (current) use of aspirin: Secondary | ICD-10-CM | POA: Diagnosis not present

## 2020-01-24 DIAGNOSIS — Z90710 Acquired absence of both cervix and uterus: Secondary | ICD-10-CM | POA: Diagnosis not present

## 2020-01-24 LAB — CBC WITH DIFFERENTIAL/PLATELET
Abs Immature Granulocytes: 0.01 10*3/uL (ref 0.00–0.07)
Basophils Absolute: 0 10*3/uL (ref 0.0–0.1)
Basophils Relative: 1 %
Eosinophils Absolute: 0.1 10*3/uL (ref 0.0–0.5)
Eosinophils Relative: 4 %
HCT: 34.5 % — ABNORMAL LOW (ref 36.0–46.0)
Hemoglobin: 10.8 g/dL — ABNORMAL LOW (ref 12.0–15.0)
Immature Granulocytes: 0 %
Lymphocytes Relative: 19 %
Lymphs Abs: 0.6 10*3/uL — ABNORMAL LOW (ref 0.7–4.0)
MCH: 34.4 pg — ABNORMAL HIGH (ref 26.0–34.0)
MCHC: 31.3 g/dL (ref 30.0–36.0)
MCV: 109.9 fL — ABNORMAL HIGH (ref 80.0–100.0)
Monocytes Absolute: 0.4 10*3/uL (ref 0.1–1.0)
Monocytes Relative: 11 %
Neutro Abs: 2 10*3/uL (ref 1.7–7.7)
Neutrophils Relative %: 65 %
Platelets: 173 10*3/uL (ref 150–400)
RBC: 3.14 MIL/uL — ABNORMAL LOW (ref 3.87–5.11)
RDW: 14.8 % (ref 11.5–15.5)
WBC: 3.1 10*3/uL — ABNORMAL LOW (ref 4.0–10.5)
nRBC: 0 % (ref 0.0–0.2)

## 2020-01-24 LAB — COMPREHENSIVE METABOLIC PANEL
ALT: 11 U/L (ref 0–44)
AST: 17 U/L (ref 15–41)
Albumin: 3.7 g/dL (ref 3.5–5.0)
Alkaline Phosphatase: 83 U/L (ref 38–126)
Anion gap: 13 (ref 5–15)
BUN: 14 mg/dL (ref 8–23)
CO2: 26 mmol/L (ref 22–32)
Calcium: 9.7 mg/dL (ref 8.9–10.3)
Chloride: 113 mmol/L — ABNORMAL HIGH (ref 98–111)
Creatinine, Ser: 0.8 mg/dL (ref 0.44–1.00)
GFR calc Af Amer: >60 mL/min (ref >60)
GFR calc non Af Amer: >60 mL/min (ref >60)
Glucose, Bld: 101 mg/dL — ABNORMAL HIGH (ref 70–99)
Potassium: 4.7 mmol/L (ref 3.5–5.1)
Sodium: 152 mmol/L — ABNORMAL HIGH (ref 135–145)
Total Bilirubin: 0.5 mg/dL (ref 0.3–1.2)
Total Protein: 6.3 g/dL — ABNORMAL LOW (ref 6.5–8.1)

## 2020-01-24 MED ORDER — LIDOCAINE-PRILOCAINE 2.5-2.5 % EX CREA
1.0000 | TOPICAL_CREAM | CUTANEOUS | 0 refills | Status: DC | PRN
Start: 2020-01-24 — End: 2021-06-14

## 2020-01-24 NOTE — Telephone Encounter (Signed)
Scheduled appts per 7/23 los. Pt confirmed appt dates and times.

## 2020-01-24 NOTE — Progress Notes (Signed)
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   Since her last visit, she underwent left lumpectomy on 01/16/2020 under Dr. Georgette Dover. Pathology from the procedure ([id number]) showed: invasive ductal carcinoma with calcifications and fibrosis, grade 3, 1.2 cm.  The biopsied lymph node showed metastatic carcinoma, 0.7 cm, with focal extranodal extension.  The tumor is HER-2 positive on retesting!  Unfortunately her port was removed at the time of definitive surgery   REVIEW OF SYSTEMS: Julie Thompson did very well with her surgery, with minimal pain, which has resolved, no bleeding, no fever.  She is already doing her arm exercises.  She is getting "back to normal" very quickly.  A detailed review of systems today was otherwise noncontributory    HISTORY OF CURRENT ILLNESS: From the original intake note:  Julie Thompson had routine screening mammography on 06/21/2019 showing a possible abnormality in the left breast. She underwent left diagnostic mammography with tomography and left breast ultrasonography at [facility] on 07/01/2019 showing: breast density category C; either 2 adjacent masses or 1 complex dumbbell-shaped mass containing  calcifications in the left breast at 6 o'clock, the largest of which measures 2.1 cm; chest wall invasion not excluded; 1-2 mm group of calcifications just anterior to the mass(es); single abnormal lymph node in the left axilla; vascular calcifications in anterior left breast.  Accordingly on 07/09/2019 she proceeded to biopsy of the left breast areas in question. The pathology from this procedure ([id number]) showed:  1. Left Breast, 5:30, posterior  - invasive ductal carcinoma with necrosis, grade 3  - Prognostic indicators significant for: estrogen receptor, 0% negative and progesterone receptor, 0% negative. Proliferation marker Ki67 at 60%. HER2 negative by immunohistochemistry (0). 2. Left Breast, 5:30, posterior  - invasive ductal carcinoma with necrosis, grade 3  - ductal carcinoma in situ, intermediate grade 3. Left Axilla, lymph node (1)  - metastatic carcinoma involving a lymph node  And additional biopsy of the anterior vascular calcifications in the left breast was performed on 07/10/2019. Pathology [id number]) showed: fibrocystic changes with calcifications; no malignancy identified.  This was felt to be concordant.  The patient's subsequent history is as detailed below.   PAST MEDICAL HISTORY: Past Medical History:  Diagnosis Date  . Arthritis 2012  . Asthma    no problems now  . Family history of basal cell carcinoma   . Family history of breast cancer   . Family history of colon cancer   . Family history of kidney cancer   . Family history of leukemia   . Family history of peritoneal cancer   . GERD (gastroesophageal reflux disease)   . H/O hiatal hernia   . Hyperlipidemia   . Hypothyroidism   . IBS (irritable bowel syndrome)   . lt breast ca dx'd 07/10/19    PAST SURGICAL HISTORY: Past Surgical History:  Procedure Laterality Date  . ABDOMINAL HYSTERECTOMY     age 47  . BREAST LUMPECTOMY WITH RADIOACTIVE SEED AND AXILLARY LYMPH NODE DISSECTION Left 01/16/2020    Procedure: LEFT BREAST LUMPECTOMY WITH RADIOACTIVE SEED AND TARGETED AXILLARY LYMPH NODE DISSECTION;  Surgeon: Manus Rudd, MD;  Location: [facility] OR;  Service: General;  Laterality: Left;  LMA  . BREAST SURGERY    . DILATION AND CURETTAGE OF UTERUS     2 miscarriages age 66 & 51  . FLEXIBLE SIGMOIDOSCOPY N/A 10/22/2013   Procedure: FLEXIBLE SIGMOIDOSCOPY;  Surgeon: Charolett Bumpers, MD;  Location: [facility];  Service: Endoscopy;  Laterality: N/A;  . KNEE ARTHROSCOPY Left   . PORT-A-CATH REMOVAL N/A 01/16/2020   Procedure: REMOVAL PORT-A-CATH;  Surgeon: Manus Rudd, MD;  Location: [facility] OR;  Service: General;  Laterality: N/A;  . PORTACATH PLACEMENT Right 07/29/2019   Procedure: INSERTION PORT-A-CATH WITH ULTRASOUND;  Surgeon: Manus Rudd, MD;  Location: [facility];  Service: General;  Laterality: Right;  . TONSILLECTOMY     age 36    FAMILY HISTORY: Family History  Problem Relation Age of Onset  . Breast cancer Maternal Aunt        dx. in her 73s  . Cancer Mother 72       peritoneal cancer, death certificate lists ovarian cancer  . Colon cancer Paternal Uncle        dx. late 56s or early 38s  . Basal cell carcinoma Sister 39       a second diagnosed in her 40s  . Kidney cancer Maternal Grandmother 24  . Diabetes Paternal Grandmother   . Heart Problems Paternal Grandmother   . Leukemia Paternal Uncle        dx. early 9s   Patient's father is currently living at age 67 as of August 01, 2019. Patient's mother died from ovarian cancer at age 40. She was diagnosed at age 2. The patient reports breast cancer in a maternal aunt, diagnosed in her 35's. The patient also noted colon cancer in a paternal uncle and kidney cancer in her maternal grandmother.  She has 1 sister.   GYNECOLOGIC HISTORY:  No LMP recorded. Patient has had a hysterectomy. Menarche: [age] years old Age at first live birth: 74 years old GX P 2 LMP age 27 (with hysterectomy) Contraceptive: never used HRT  used for 10-12 years  Hysterectomy? Yes, age 50 BSO? 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  Husband Julie Noa "Butch" Valentina Lucks Sr. is a retired Public librarian. She lives at home with husband Butch. She has two children from a prior marriage. Son Julie Thompson, age 28, works as a Music therapist in [place], [place]. Son Julie Thompson, age 87, works as a Production assistant, radio in [place], [place]. The patient has 4 grandchildren and 4 great-grandchildren. She attends [employer].    ADVANCED DIRECTIVES: In the absence of any documentation to the contrary, the patient's spouse is their HCPOA.    HEALTH MAINTENANCE: Social History   Tobacco Use  . Smoking status: Never Smoker  . Smokeless tobacco: Never Used  Vaping Use  . Vaping Use: Never used  Substance Use Topics  . Alcohol use: Never  . Drug use: No     Colonoscopy: 09/2013, with CT virtual 11/2013  PAP: none on file, s/p hysterectomy  Bone density: 04/2001, -0.5   Allergies  Allergen Reactions  . Sulfa Antibiotics Rash  . Penicillins Hives    Current  Outpatient Medications  Medication Sig Dispense Refill  . acetaminophen (TYLENOL) 650 MG CR tablet Take 650 mg by mouth every 8 (eight) hours as needed for pain.    Marland Kitchen aspirin EC 81 MG tablet Take 81 mg by mouth daily.    Marland Kitchen atorvastatin (LIPITOR) 20 MG tablet Take 20 mg by mouth daily.    . Cholecalciferol (VITAMIN D) 2000 UNITS tablet Take 2,000 Units by mouth daily.    Marland Kitchen co-enzyme Q-10 30 MG capsule Take 30 mg by mouth 3 (three) times daily.     Marland Kitchen esomeprazole (NEXIUM) 20 MG capsule Take 20 mg by mouth daily at 12 noon.    . famotidine (PEPCID) 20 MG tablet Take 20 mg by mouth daily as needed for heartburn or indigestion.     Marland Kitchen FIBER PO Take 1 tablet by mouth daily.    Marland Kitchen HYDROcodone-acetaminophen (NORCO/VICODIN) 5-325 MG tablet Take 1 tablet by mouth every 6 (six) hours as needed for moderate pain. 15 tablet 0  . levothyroxine  (SYNTHROID) 50 MCG tablet Take 50 mcg by mouth daily before breakfast.     . loratadine (CLARITIN) 10 MG tablet Take 1 tablet (10 mg total) by mouth daily. 60 tablet 1  . Multiple Vitamin (MULTIVITAMIN WITH MINERALS) TABS tablet Take 1 tablet by mouth daily.    . Omega-3 Fatty Acids (FISH OIL) 1200 MG CAPS Take 1,200 mg by mouth daily.    . prochlorperazine (COMPAZINE) 10 MG tablet Take 1 tablet (10 mg total) by mouth every 6 (six) hours as needed (Nausea or vomiting). 30 tablet 1   No current facility-administered medications for this visit.    OBJECTIVE: White woman who appears stated age 20:   01/24/20 0909  BP: 119/76  Pulse: 77  Resp: 16  Temp: 98.3 F (36.8 C)  SpO2: 100%     Body mass index is 36.35 kg/m.   Wt Readings from Last 3 Encounters:  01/24/20 186 lb 1.6 oz (84.4 kg)  01/16/20 185 lb (83.9 kg)  01/09/20 187 lb 11.2 oz (85.1 kg)  ECOG FS:1 - Symptomatic but completely ambulatory  Sclerae unicteric, EOMs intact Wearing a mask No cervical or supraclavicular adenopathy Lungs no rales or rhonchi Heart regular rate and rhythm Abd soft, nontender, positive bowel sounds MSK no focal spinal tenderness, no upper extremity lymphedema Neuro: nonfocal, well oriented, appropriate affect Breasts: The right breast is unremarkable.  The left breast is status post recent surgery.  The cosmetic result is excellent.  There is no dehiscence erythema or swelling.  Both axillae are benign.    LAB RESULTS:  CMP     Component Value Date/Time   NA 143 01/07/2020 1010   K 4.2 01/07/2020 1010   CL 111 01/07/2020 1010   CO2 24 01/07/2020 1010   GLUCOSE 92 01/07/2020 1010   BUN 18 01/07/2020 1010   CREATININE 0.77 01/07/2020 1010   CREATININE 0.94 07/17/2019 0823   CALCIUM 9.0 01/07/2020 1010   PROT 6.0 (L) 01/07/2020 1010   ALBUMIN 3.6 01/07/2020 1010   AST 16 01/07/2020 1010   AST 21 07/17/2019 0823   ALT 12 01/07/2020 1010   ALT 18 07/17/2019 0823   ALKPHOS 86  01/07/2020 1010   BILITOT 0.3 01/07/2020 1010   BILITOT 0.4 07/17/2019 0823   GFRNONAA >60 01/07/2020 1010   GFRNONAA >60 07/17/2019 0823   GFRAA >60 01/07/2020 1010   GFRAA >60 07/17/2019 0823    No results found for: TOTALPROTELP, ALBUMINELP, A1GS, A2GS, BETS, BETA2SER,  GAMS, MSPIKE, SPEI  Lab Results  Component Value Date   WBC 3.1 (L) 01/24/2020   NEUTROABS 2.0 01/24/2020   HGB 10.8 (L) 01/24/2020   HCT 34.5 (L) 01/24/2020   MCV 109.9 (H) 01/24/2020   PLT 173 01/24/2020    No results found for: LABCA2  No components found for: [id number]  No results for input(s): INR in the last 168 hours.  No results found for: LABCA2  No results found for: MHD622  No results found for: WLN989  No results found for: QJJ941  No results found for: CA2729  No components found for: HGQUANT  No results found for: CEA1 / No results found for: CEA1   No results found for: AFPTUMOR  No results found for: CHROMOGRNA  No results found for: KPAFRELGTCHN, LAMBDASER, KAPLAMBRATIO (kappa/lambda light chains)  No results found for: HGBA, HGBA2QUANT, HGBFQUANT, HGBSQUAN (Hemoglobinopathy evaluation)   No results found for: LDH  No results found for: IRON, TIBC, IRONPCTSAT (Iron and TIBC)  No results found for: FERRITIN  Urinalysis No results found for: COLORURINE, APPEARANCEUR, LABSPEC, PHURINE, GLUCOSEU, HGBUR, BILIRUBINUR, KETONESUR, PROTEINUR, UROBILINOGEN, NITRITE, LEUKOCYTESUR   STUDIES: MM Breast Surgical Specimen  Result Date: 01/16/2020 CLINICAL DATA:  Evaluate surgical specimen following excision of LEFT axillary lymph node metastasis. EXAM: SPECIMEN RADIOGRAPH OF THE LEFT AXILLA COMPARISON:  Previous exam(s). FINDINGS: Status post excision of the LEFT axilla. The Q biopsy marker clip is present and marked for pathology. The radioactive seed is present within a separate container and completely intact. IMPRESSION: Specimen radiograph of the LEFT axilla. Electronically  Signed   By: Margarette Canada M.D.   On: 01/16/2020 11:03   MM Breast Surgical Specimen  Result Date: 01/16/2020 CLINICAL DATA:  Left lumpectomy for left breast cancer. EXAM: SPECIMEN RADIOGRAPH OF THE LEFT BREAST COMPARISON:  Previous exam(s). FINDINGS: Status post excision of the left breast. The radioactive seed, coil shaped biopsy marker clip, ribbon shaped biopsy marker clip, 2 X shaped biopsy marker clips and multiple calcifications are present, completely intact, and were marked for pathology. The previously placed barbell shaped biopsy marker clip at the location of a previous biopsy demonstrating ADH is not in the specimen. This was discussed with Dr. Georgette Dover in the operating room. IMPRESSION: Specimen radiograph of the left breast. Electronically Signed   By: Claudie Revering M.D.   On: 01/16/2020 10:41   MM LT RADIOACTIVE SEED LOC MAMMO GUIDE  Result Date: 01/15/2020 CLINICAL DATA:  74 year old patient with 2 adjacent sites of known malignancy in the inferior central left breast with 2 biopsy clips, ribbon anteriorly and coil posteriorly separated by approximately 1.9 cm. In discussion with Dr. Gershon Crane today, a single radioactive seed localizing the more anterior aspect of the known malignancy is requested, given appearances of the residual disease on recent breast MRI. Please also note that an ultrasound-guided localization of a biopsy-proven left axillary nodal metastasis is performed today and dictated separately. EXAM: MAMMOGRAPHIC GUIDED RADIOACTIVE SEED LOCALIZATION OF THE LEFT BREAST COMPARISON:  Previous exam(s). FINDINGS: Patient presents for radioactive seed localization prior to lumpectomy. I met with the patient and we discussed the procedure of seed localization including benefits and alternatives. We discussed the high likelihood of a successful procedure. We discussed the risks of the procedure including infection, bleeding, tissue injury and further surgery. We discussed the low dose of  radioactivity involved in the procedure. Informed, written consent was given. The usual time-out protocol was performed immediately prior to the procedure. Using mammographic guidance, sterile technique, 1%  lidocaine and an I-125 radioactive seed, residual microcalcifications in the inferior left breast at the site of known malignancy were localized using a lateral approach. The radioactive seed is posterior to the ribbon shaped biopsy clip by approximately 6 mm. The known malignancy continues posterior to the radioactive seed as well. The seed in the expected location and were marked for Dr. Gershon Crane. Follow-up survey of the patient confirms presence of the radioactive seed. Order number of I-125 seed:  [id number]. Total activity: 0.247 mCi reference Date: 28 Nov 2019 The patient tolerated the procedure well and was released from the [facility]. She was given instructions regarding seed removal. IMPRESSION: Radioactive seed localization left breast. No apparent complications. Electronically Signed   By: Curlene Dolphin M.D.   On: 01/15/2020 16:15   [place] LT RADIOACTIVE SEED LOC  Result Date: 01/15/2020 CLINICAL DATA:  74 year old patient with left breast cancer and a known axillary nodal metastasis following ultrasound-guided core needle biopsy of a suspicious left axillary lymph node. Patient has completed neoadjuvant chemotherapy. She presents for radioactive seed localization of the biopsied lymph node and associated biopsy clip. EXAM: ULTRASOUND GUIDED RADIOACTIVE SEED LOCALIZATION OF THE LEFT AXILLA COMPARISON:  Previous exam(s). FINDINGS: Patient presents for radioactive seed localization prior to [place]. I met with the patient and we discussed the procedure of seed localization including benefits and alternatives. We discussed the high likelihood of a successful procedure. We discussed the risks of the procedure including infection, bleeding, tissue injury and further  surgery. We discussed the low dose of radioactivity involved in the procedure. Informed, written consent was given. The usual time-out protocol was performed immediately prior to the procedure. Using ultrasound guidance, sterile technique, 1% lidocaine and an I-125 radioactive seed, echogenic biopsy clip with associated artifact and surrounding hypoechogenicity in the left axilla was localized using a medial approach. The follow-up mammogram images confirm the seed in the expected location and were marked for Dr. Georgette Dover. Follow-up survey of the patient confirms presence of the radioactive seed. Order number of I-125 seed:  [id number]. Total activity: 0.246 mCi reference Date: 27 December 2019 The patient tolerated the procedure well and was released from the [place]. She was given instructions regarding seed removal. IMPRESSION: Radioactive seed localization left axillary lymph node containing a biopsy clip. No apparent complications. Electronically Signed   By: Curlene Dolphin M.D.   On: 01/15/2020 16:10     ELIGIBLE FOR AVAILABLE RESEARCH PROTOCOL: no  ASSESSMENT: 74 y.o. [place] woman status post left breast lower outer quadrant biopsy 07/09/2019 for a clinical T2 N1, stage IIIB invasive ductal carcinoma, grade 3, triple negative, with an MIB-1 of 60%  (a) chest CT scan and bone scan 07/31/2019 showed no evidence of metastatic disease  (b) MRI biopsy of 2 additional areas in the left breast (central, 07/10/2019, and at 8:30 o'clock on 07/26/2019) are both benign and concordant  (c) biopsy of 2 suspicious areas in the right breast on 07/26/2019 (9:00 and lower outer quadrant) were benign and concordant  (d) PET scan 11/21/2019 shows a 1.2 cm nodule in the left breast with an SUV max of 2.7, aortic calcification, but no lung liver or bone lesions, no evidence of metastases   (1) neoadjuvant chemotherapy consisting of cyclophosphamide and doxorubicin in dose dense fashion x4 started 07/30/2019,  completed 09/10/2019, followed by weekly carboplatin and paclitaxel x12 started 09/24/2019  (a) cycle 6 carboplatin/paclitaxel delayed 2 weeks because of neutropenia: granix added  (b) cycle 12 of carboplatin/paclitaxel  omitted secondary to persistent low counts  (2) left lumpectomy with targeted axillary lymph node dissection 01/16/2020 showed a ypTIc ypN1 invasive ductal carcinoma, grade 3  (a) a single axillary lymph node was removed  (b) repeat prognostic panel on the surgical specimen showed the tumor to be estrogen and progesterone receptor negative, MIB-110%, and HER-2 positive with a signals ratio of 2.97 and a copy number per cell 4.90.  (3) adjuvant radiation to follow  (4) genetics testing 07/23/2019 through the STAT Breast cancer panel offered by [facility] found no deleterious mutations in ATM, BRCA1, BRCA2, CDH1, CHEK2, PALB2, PTEN, STK11 and TP53.  Additional testing through the Common Hereditary Cancers Panel offered by [facility] also found no deleterious mutations in IPC, ATM, AXIN2, BARD1, BMPR1A, BRCA1, BRCA2, BRIP1, CDH1, CDK4, CDKN2A (p14ARF), CDKN2A (p16INK4a), CHEK2, CTNNA1, DICER1, EPCAM (Deletion/duplication testing only), GREM1 (promoter region deletion/duplication testing only), KIT, MEN1, MLH1, MSH2, MSH3, MSH6, MUTYH, NBN, NF1, NHTL1, PALB2, PDGFRA, PMS2, POLD1, POLE, PTEN, RAD50, RAD51C, RAD51D, RNF43, SDHB, SDHC, SDHD, SMAD4, SMARCA4. STK11, TP53, TSC1, TSC2, and VHL.  The following genes were evaluated for sequence changes only: SDHA and HOXB13 c.251G>A variant only.   (a) 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   (5) T-DM1/ [place] to start 02/18/2020, to be repeated Q3W x 14  (a) echo   PLAN: Tolu[place] did remarkably well with her surgery and has a terrific cosmetic result.  It is of concern that she had residual disease but the good side of that is that the residual disease is clearly HER-2 positive.  That gives [place] a new  target.  Unfortunately we had removed the port.  She understands that in the vast majority of cases we do not do any more chemo after the chemotherapy she received.  With this new information however she will need anti-HER-2 immunotherapy, more specifically T-DM1, and she would much prefer to receive that through a port then through a peripheral vein.  I have alerted Dr. Georgette Dover regarding the need to have the port replaced sometime before she starts her new treatments 02/18/2020.  We discussed the possible toxicities side effects and complications of T-DM1 including the possibility of weakening the heart muscle, irritation of the liver, nausea and fatigue.  Hair thinning or hair loss and low counts are not generally issues as they were with chemotherapy.  If she has difficulty tolerating T-DM1 we will default to Herceptin plus or minus Perjeta.  The plan is for 1 year of treatment  Cashe knows the goal of treatment in her case is cure.  She is very motivated to proceed as planned.  There is no need to postpone her radiation since a T-DM1 can be given concurrently  Total encounter time 30 minutes.Julie Jews C. Emilio Baylock, MD 01/24/20 9:27 AM Medical Oncology and Hematology [facility] [place],  [address] Tel. [phone]    Fax. [phone]   I, Wilburn Mylar, am acting as scribe for Dr. Virgie Thompson. Julie Thompson.  I, Lurline Del MD, have reviewed the above documentation for accuracy and completeness, and I agree with the above.   *Total Encounter Time as defined by the Centers for Medicare and Medicaid Services includes, in addition to the face-to-face time of a patient visit (documented in the note above) non-face-to-face time: obtaining and reviewing outside history, ordering and reviewing medications, tests or procedures, care coordination (communications with other health care professionals or caregivers) and documentation in the  medical record.

## 2020-01-27 ENCOUNTER — Encounter: Payer: Self-pay | Admitting: *Deleted

## 2020-02-03 ENCOUNTER — Ambulatory Visit: Payer: Self-pay | Admitting: Surgery

## 2020-02-03 NOTE — H&P (View-Only) (Signed)
  History of Present Illness (Nupur Hohman K. Tamikia Chowning MD; 02/03/2020 2:13 PM) The patient is a 74 year old female who presents with breast cancer. This is a 74 year old female who presented in January 2021 after a recent screening mammogram that discovered a mass in the left breast. She underwent further work-up that revealed a bilobed, dumbell-shaped mass with calcifications with total diameter about 3.7 cm. She also had a single abnormal lymph node in the left axilla. 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 The lymph node was biopsied and revealed metastatic disease. She was seen in the [facility]  On 07/29/19, she underwent ultrasound-guided port placement. The port has been functioning well. She completed her chemotherapy and underwent surgery on 01/16/20. She had left lumpectomy with targeted dissection. Path showed IDC 1.2 cm with clear margins. The lymph node was positive with focal extranodal extension. 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 We are planning to replace her port for Herceptin.  She is doing well after surgery with minimal discomfort.   Allergies (Lisa Caldwell, RMA; 02/03/2020 8:49 AM) Sulfa Antibiotics Rash. Penicillins Hives. Allergies Reconciled  Medication History (Lisa Caldwell, RMA; 02/03/2020 8:51 AM) Aspirin (81MG Tablet DR, Oral) Active. Co-Enzyme Q-10 (30MG Capsule, Oral) Active. Esomeprazole Magnesium (20MG Packet, Oral) Active. Famotidine (20MG Tablet, Oral) Active. Fish Oil Extra Strength (1200MG Capsule, Oral) Active. Tylenol (Oral) Specific strength unknown - Active.    Vitals (Lisa Caldwell RMA; 02/03/2020 8:51 AM) 02/03/2020 8:51 AM Weight: 187.5 lb Height: 60in Body Surface Area: 1.82 m Body Mass Index: 36.62 kg/m  Temp.: 98F  Pulse: 90 (Regular)  P.OX: 99% (Room air) BP: 120/74(Sitting, Left Arm, Standard)        Physical Exam  (Capri Veals K. Jaysten Essner MD; 02/03/2020 2:14 PM)  The physical exam findings are as follows: Note:Well-developed well-nourished in no apparent distress Left axillary incision is well-healed no sign of infection Left circumareolar incision is well-healed sign of infection.    Assessment & Plan (Dawon Troop K. 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   Call us sooner as needed.  Note:We will plan her port insertion under ultrasound guidance next week. The surgical procedure has been discussed with the patient. Potential risks, benefits, alternative treatments, and expected outcomes have been explained. All of the patient's questions at this time have been answered. The likelihood of reaching the patient's treatment goal is good. The patient understand the proposed surgical procedure and wishes to proceed.  Qunicy Higinbotham K. Dieter Hane, MD, FACS [facility]  General/ Trauma Surgery   02/03/2020 2:15 PM  

## 2020-02-03 NOTE — H&P (Signed)
  History of Present Illness Julie Thompson. Jillianne Gamino MD; 02/03/2020 2:13 PM) The patient is a 74 year old female who presents with breast cancer. This is a 74 year old female who presented in January 2021 after a recent screening mammogram that discovered a mass in the left breast. She underwent further work-up that revealed a bilobed, dumbell-shaped mass with calcifications with total diameter about 3.7 cm. She also had a single abnormal lymph node in the left axilla. 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 The lymph node was biopsied and revealed metastatic disease. She was seen in the [facility]  On 07/29/19, she underwent ultrasound-guided port placement. The port has been functioning well. She completed her chemotherapy and underwent surgery on 01/16/20. She had left lumpectomy with targeted dissection. Path showed IDC 1.2 cm with clear margins. The lymph node was positive with focal extranodal extension. 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 We are planning to replace her port for Herceptin.  She is doing well after surgery with minimal discomfort.   Allergies Darden Palmer, [place]; 02/03/2020 8:49 AM) Sulfa Antibiotics Rash. Penicillins Hives. Allergies Reconciled  Medication History Darden Palmer, [place]; 02/03/2020 8:51 AM) Aspirin (81MG Tablet DR, Oral) Active. Co-Enzyme Q-10 (30MG Capsule, Oral) Active. Esomeprazole Magnesium (20MG Packet, Oral) Active. Famotidine (20MG Tablet, Oral) Active. Fish Oil Extra Strength (1200MG Capsule, Oral) Active. Tylenol (Oral) Specific strength unknown - Active.    Vitals Lattie Haw [place] RMA; 02/03/2020 8:51 AM) 02/03/2020 8:51 AM Weight: 187.5 lb Height: 60in Body Surface Area: 1.82 m Body Mass Index: 36.62 kg/m  Temp.: 5F  Pulse: 90 (Regular)  P.OX: 99% (Room air) BP: 120/74(Sitting, Left Arm, Standard)        Physical Exam  Rodman Key K. Kamar Callender MD; 02/03/2020 2:14 PM)  The physical exam findings are as follows: Note:Well-developed well-nourished in no apparent distress Left axillary incision is well-healed no sign of infection Left circumareolar incision is well-healed sign of infection.    Assessment & Plan Julie Thompson. 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   Call us sooner as needed.  Note:We will plan her port insertion under ultrasound guidance next week. The surgical procedure has been discussed with the patient. Potential risks, benefits, alternative treatments, and expected outcomes have been explained. All of the patient's questions at this time have been answered. The likelihood of reaching the patient's treatment goal is good. The patient understand the proposed surgical procedure and wishes to proceed.  Julie Thompson. Georgette Dover, MD, [facility] Surgery  General/ Trauma Surgery   02/03/2020 2:15 PM

## 2020-02-04 ENCOUNTER — Other Ambulatory Visit: Payer: Self-pay | Admitting: Oncology

## 2020-02-06 ENCOUNTER — Other Ambulatory Visit: Payer: Self-pay

## 2020-02-06 ENCOUNTER — Other Ambulatory Visit: Payer: Self-pay | Admitting: Oncology

## 2020-02-06 NOTE — Progress Notes (Signed)
Location of Breast Cancer: Malignant neoplasm of lower-outer quadrant of LEFT breast, estrogen receptor negative   Histology per Pathology Report:  01/16/2020 FINAL MICROSCOPIC DIAGNOSIS:  A. BREAST, LEFT, LUMPECTOMY:  - Invasive ductal carcinoma with calcifications and fibrosis, 1.2 cm.  - Invasive carcinoma focally less than 0.1 cm from the junction of the  inferior and lateral margins.  - Fibrocystic changes with calcifications and usual ductal hyperplasia.  - Biopsy site and biopsy clip.  B. LYMPH NODE, LEFT AXILLARY, BIOPSY:  - Metastatic carcinoma in 1 lymph node (0/1).  - Metastatic nodule is 0.7 cm.  - Focal extranodal extension.  C. RADIOACTIVE SEED, LEFT AXILLARY LYMPH NODE, REMOVAL:  - Radioactive seed processed as per protocol.  - Gross examination only.  Receptor Status: ER(negative), PR (negative), Her2-neu (positive), Ki-67(10%)  Did patient present with symptoms (if so, please note symptoms) or was this found on screening mammography?:  Routine screening mammography on 06/21/2019 showing a possible abnormality in the left breast. She underwent left diagnostic mammography with tomography and left breast ultrasonography at [facility] on 07/01/2019 showing: breast density category C; either 2 adjacent masses or 1 complex dumbbell-shaped mass containing calcifications in the left breast at 6 o'clock, the largest of which measures 2.1 cm; chest wall invasion not excluded; 1-2 mm group of calcifications just anterior to the mass(es); single abnormal lymph node in the left axilla; vascular calcifications in anterior left breast  Past/Anticipated interventions by surgeon, if any: 01/16/2020 Dr. Donnie Mesa Left heart radioactive seed localized lumpectomy/left axillary targeted lymph node dissection  Past/Anticipated interventions by medical oncology, if any:  Under care of Dr. Sarajane Jews Magrinat 01/24/2020 (1) neoadjuvant chemotherapy consisting of cyclophosphamide and  doxorubicin in dose dense fashion x4 started 07/30/2019, completed 09/10/2019, followed by weekly carboplatin and paclitaxel x12 started 09/24/2019             (a) cycle 6 carboplatin/paclitaxel delayed 2 weeks because of neutropenia: granix added             (b) cycle 12 of carboplatin/paclitaxel omitted secondary to persistent low counts (2) left lumpectomy with targeted axillary lymph node dissection 01/16/2020 (3) adjuvant radiation to follow (4) genetics testing 07/23/2019 through the STAT Breast cancer panel offered by [facility] found no deleterious mutations              (a) 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  (5) T-DM1/ Kadcyla to start 02/18/2020, to be repeated Q3W x 14             (a) echo There is no need to postpone her radiation since a T-DM1 can be given concurrently  Lymphedema issues, if any:  None  Pain issues, if any:  Patient denies   SAFETY ISSUES:  Prior radiation? No  Pacemaker/ICD? No  Possible current pregnancy? No-abdominal hysterectomy age 68  Is the patient on methotrexate? No  Current Complaints / other details:  Nothing of note

## 2020-02-07 ENCOUNTER — Encounter: Payer: Self-pay | Admitting: Radiation Oncology

## 2020-02-07 ENCOUNTER — Ambulatory Visit
Admission: RE | Admit: 2020-02-07 | Discharge: 2020-02-07 | Disposition: A | Discharge status: Home / Self Care | Payer: Medicare Other | Source: Ambulatory Visit | Attending: Radiation Oncology | Admitting: Radiation Oncology

## 2020-02-07 VITALS — BP 138/76 | HR 88 | Temp 97.9°F | Resp 18 | Ht 60.0 in | Wt 187.4 lb

## 2020-02-07 DIAGNOSIS — C773 Secondary and unspecified malignant neoplasm of axilla and upper limb lymph nodes: Secondary | ICD-10-CM | POA: Insufficient documentation

## 2020-02-07 DIAGNOSIS — Z9889 Other specified postprocedural states: Secondary | ICD-10-CM | POA: Diagnosis not present

## 2020-02-07 DIAGNOSIS — Z79899 Other long term (current) drug therapy: Secondary | ICD-10-CM | POA: Diagnosis not present

## 2020-02-07 DIAGNOSIS — C50512 Malignant neoplasm of lower-outer quadrant of left female breast: Secondary | ICD-10-CM | POA: Insufficient documentation

## 2020-02-07 DIAGNOSIS — Z7982 Long term (current) use of aspirin: Secondary | ICD-10-CM | POA: Diagnosis not present

## 2020-02-07 DIAGNOSIS — Z510 Encounter for antineoplastic radiation therapy: Secondary | ICD-10-CM | POA: Diagnosis not present

## 2020-02-07 DIAGNOSIS — Z171 Estrogen receptor negative status [ER-]: Secondary | ICD-10-CM | POA: Insufficient documentation

## 2020-02-09 ENCOUNTER — Encounter: Payer: Self-pay | Admitting: Radiation Oncology

## 2020-02-09 NOTE — Progress Notes (Signed)
Radiation Oncology         [phone] ________________________________  Name: Amrutha Avera MRN: [id number]  Date: 02/07/2020  DOB: 09-Apr-1946  Follow-Up Visit Note  Outpatient  CC: Leeroy Cha, MD  Magrinat, Virgie Dad, MD  Diagnosis:      ICD-10-CM   1. Malignant neoplasm of lower-outer quadrant of left breast of female, estrogen receptor negative ([place])  C50.512    Z17.1     Cancer Staging Malignant neoplasm of lower-outer quadrant of left breast of female, estrogen receptor negative ([place]) Staging form: Breast, AJCC 8th Edition - Clinical stage from 07/17/2019: Stage IIIB (cT2, cN1, cM0, G3, ER-, PR-, HER2-) - Unsigned - Pathologic stage from 01/16/2020: No Stage Recommended (ypT1c, pN1a, cM0, G3, ER-, PR-, HER2+) - Signed by Gardenia Phlegm, NP on 02/05/2020   CHIEF COMPLAINT: Here to discuss management of left breast cancer  Narrative:  The patient returns for followup; on 01/16/2020 she underwent breast conserving surgery. This was following systemic therapy. Pathology was as follows: FINAL MICROSCOPIC DIAGNOSIS:  A. BREAST, LEFT, LUMPECTOMY:  - Invasive ductal carcinoma with calcifications and fibrosis, 1.2 cm.  - Invasive carcinoma focally less than 0.1 cm from the junction of the  inferior and lateral margins.  - Fibrocystic changes with calcifications and usual ductal hyperplasia.  - Biopsy site and biopsy clip.  B. LYMPH NODE, LEFT AXILLARY, BIOPSY:  - Metastatic carcinoma in 1 lymph node   - Metastatic nodule is 0.7 cm.  - Focal extranodal extension.  C. RADIOACTIVE SEED, LEFT AXILLARY LYMPH NODE, REMOVAL:  - Radioactive seed processed as per protocol.  - Gross examination only.  Receptor Status: ER(negative), PR (negative), Her2-neu (positive), Ki-67(10%)   Past/Anticipated interventions by medical oncology, if any:  Under care of Dr. Lurline Del   (1) neoadjuvant chemotherapy consisting of cyclophosphamide and doxorubicin in dose  dense fashion x4 started 07/30/2019, completed 09/10/2019, followed by weekly carboplatin and paclitaxel x12 started 09/24/2019             (a) cycle 6 carboplatin/paclitaxel delayed 2 weeks because of neutropenia: granix added             (b) cycle 12 of carboplatin/paclitaxel omitted secondary to persistent low counts (2) left lumpectomy with targeted axillary lymph node dissection 01/16/2020 (3) adjuvant radiation to follow (4) genetics testing 07/23/2019 through the STAT Breast cancer panel offered by [facility] found no deleterious mutations              (a) 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  (5) T-DM1/ Kadcyla to start 02/18/2020, to be repeated Q3W x 14             (a) echo There is no need to postpone her radiation since a T-DM1 can be given concurrently  Lymphedema issues, if any:  None  Pain issues, if any:  Patient denies   SAFETY ISSUES:  Prior radiation? No  Pacemaker/ICD? No  Possible current pregnancy? No-abdominal hysterectomy age 74  Is the patient on methotrexate? No  Current Complaints / other details:  Of note, she did not demonstrate any internal mammary adenopathy on pre-treatment MR or CT imaging.         ALLERGIES:  is allergic to sulfa antibiotics and penicillins.  Meds: Current Outpatient Medications  Medication Sig Dispense Refill  . acetaminophen (TYLENOL) 650 MG CR tablet Take 650 mg by mouth every 8 (eight) hours as needed for pain.    Marland Kitchen aspirin EC  81 MG tablet Take 81 mg by mouth daily.    Marland Kitchen atorvastatin (LIPITOR) 20 MG tablet Take 20 mg by mouth daily.    . Cholecalciferol (VITAMIN D) 2000 UNITS tablet Take 2,000 Units by mouth daily.    Marland Kitchen co-enzyme Q-10 30 MG capsule Take 30 mg by mouth 3 (three) times daily.     Marland Kitchen esomeprazole (NEXIUM) 20 MG capsule Take 20 mg by mouth daily at 12 noon.    . famotidine (PEPCID) 20 MG tablet Take 20 mg by mouth daily as needed for heartburn or indigestion.     Marland Kitchen  FIBER PO Take 1 tablet by mouth daily.    Marland Kitchen levothyroxine (SYNTHROID) 50 MCG tablet Take 50 mcg by mouth daily before breakfast.     . loratadine (CLARITIN) 10 MG tablet Take 1 tablet (10 mg total) by mouth daily. 60 tablet 1  . Multiple Vitamin (MULTIVITAMIN WITH MINERALS) TABS tablet Take 1 tablet by mouth daily.    . Omega-3 Fatty Acids (FISH OIL) 1200 MG CAPS Take 1,200 mg by mouth daily.    Marland Kitchen lidocaine-prilocaine (EMLA) cream Apply 1 application topically as needed. (Patient not taking: Reported on 02/07/2020) 30 g 0   No current facility-administered medications for this encounter.    Physical Findings:  height is 5' (1.524 m) and weight is 187 lb 6 oz (85 kg). Her oral temperature is 97.9 F (36.6 C). Her blood pressure is 138/76 and her pulse is 88. Her respiration is 18 and oxygen saturation is 98%. .     General: Alert and oriented, in no acute distress Psychiatric: Judgment and insight are intact. Affect is appropriate. Breast exam reveals excellent healing at surgical incision sites, left breast/axilla  Lab Findings: Lab Results  Component Value Date   WBC 3.1 (L) 01/24/2020   HGB 10.8 (L) 01/24/2020   HCT 34.5 (L) 01/24/2020   MCV 109.9 (H) 01/24/2020   PLT 173 01/24/2020      Radiographic Findings: MM Breast Surgical Specimen  Result Date: 01/16/2020 CLINICAL DATA:  Evaluate surgical specimen following excision of LEFT axillary lymph node metastasis. EXAM: SPECIMEN RADIOGRAPH OF THE LEFT AXILLA COMPARISON:  Previous exam(s). FINDINGS: Status post excision of the LEFT axilla. The Q biopsy marker clip is present and marked for pathology. The radioactive seed is present within a separate container and completely intact. IMPRESSION: Specimen radiograph of the LEFT axilla. Electronically Signed   By: Margarette Canada M.D.   On: 01/16/2020 11:03   MM Breast Surgical Specimen  Result Date: 01/16/2020 CLINICAL DATA:  Left lumpectomy for left breast cancer. EXAM: SPECIMEN RADIOGRAPH  OF THE LEFT BREAST COMPARISON:  Previous exam(s). FINDINGS: Status post excision of the left breast. The radioactive seed, coil shaped biopsy marker clip, ribbon shaped biopsy marker clip, 2 X shaped biopsy marker clips and multiple calcifications are present, completely intact, and were marked for pathology. The previously placed barbell shaped biopsy marker clip at the location of a previous biopsy demonstrating ADH is not in the specimen. This was discussed with Dr. Georgette Dover in the operating room. IMPRESSION: Specimen radiograph of the left breast. Electronically Signed   By: Claudie Revering M.D.   On: 01/16/2020 10:41   MM LT RADIOACTIVE SEED LOC MAMMO GUIDE  Result Date: 01/15/2020 CLINICAL DATA:  74 year old patient with 2 adjacent sites of known malignancy in the inferior central left breast with 2 biopsy clips, ribbon anteriorly and coil posteriorly separated by approximately 1.9 cm. In discussion with Dr. Gershon Crane today, a  single radioactive seed localizing the more anterior aspect of the known malignancy is requested, given appearances of the residual disease on recent breast MRI. Please also note that an ultrasound-guided localization of a biopsy-proven left axillary nodal metastasis is performed today and dictated separately. EXAM: MAMMOGRAPHIC GUIDED RADIOACTIVE SEED LOCALIZATION OF THE LEFT BREAST COMPARISON:  Previous exam(s). FINDINGS: Patient presents for radioactive seed localization prior to lumpectomy. I met with the patient and we discussed the procedure of seed localization including benefits and alternatives. We discussed the high likelihood of a successful procedure. We discussed the risks of the procedure including infection, bleeding, tissue injury and further surgery. We discussed the low dose of radioactivity involved in the procedure. Informed, written consent was given. The usual time-out protocol was performed immediately prior to the procedure. Using mammographic guidance, sterile  technique, 1% lidocaine and an I-125 radioactive seed, residual microcalcifications in the inferior left breast at the site of known malignancy were localized using a lateral approach. The radioactive seed is posterior to the ribbon shaped biopsy clip by approximately 6 mm. The known malignancy continues posterior to the radioactive seed as well. The seed in the expected location and were marked for Dr. Gershon Crane. Follow-up survey of the patient confirms presence of the radioactive seed. Order number of I-125 seed:  [id number]. Total activity: 0.247 mCi reference Date: 28 Nov 2019 The patient tolerated the procedure well and was released from the [facility]. She was given instructions regarding seed removal. IMPRESSION: Radioactive seed localization left breast. No apparent complications. Electronically Signed   By: Curlene Dolphin M.D.   On: 01/15/2020 16:15   [place] LT RADIOACTIVE SEED LOC  Result Date: 01/15/2020 CLINICAL DATA:  74 year old patient with left breast cancer and a known axillary nodal metastasis following ultrasound-guided core needle biopsy of a suspicious left axillary lymph node. Patient has completed neoadjuvant chemotherapy. She presents for radioactive seed localization of the biopsied lymph node and associated biopsy clip. EXAM: ULTRASOUND GUIDED RADIOACTIVE SEED LOCALIZATION OF THE LEFT AXILLA COMPARISON:  Previous exam(s). FINDINGS: Patient presents for radioactive seed localization prior to [place]. I met with the patient and we discussed the procedure of seed localization including benefits and alternatives. We discussed the high likelihood of a successful procedure. We discussed the risks of the procedure including infection, bleeding, tissue injury and further surgery. We discussed the low dose of radioactivity involved in the procedure. Informed, written consent was given. The usual time-out protocol was performed immediately prior to the procedure.  Using ultrasound guidance, sterile technique, 1% lidocaine and an I-125 radioactive seed, echogenic biopsy clip with associated artifact and surrounding hypoechogenicity in the left axilla was localized using a medial approach. The follow-up mammogram images confirm the seed in the expected location and were marked for Dr. Georgette Dover. Follow-up survey of the patient confirms presence of the radioactive seed. Order number of I-125 seed:  [id number]. Total activity: 0.246 mCi reference Date: 27 December 2019 The patient tolerated the procedure well and was released from the [facility]. She was given instructions regarding seed removal. IMPRESSION: Radioactive seed localization left axillary lymph node containing a biopsy clip. No apparent complications. Electronically Signed   By: Curlene Dolphin M.D.   On: 01/15/2020 16:10    Impression/Plan: We discussed adjuvant radiotherapy today.  I recommend 6 weeks of radiotherapy to the left breast and regional nodes in order to reduce risk of locoregional recurrence by 2/3.  I reviewed the logistics, benefits, risks, and potential side effects  of this treatment in detail. Risks may include but not necessary be limited to acute and late injury tissue in the radiation fields such as skin irritation (change in color/pigmentation, itching, dryness, pain, peeling). She may experience fatigue. There is possible risk of long term cosmetic changes or scar tissue. Other possible risks include lymphedema, musculoskeletal changes, rib fragility. Because of the techniques we will use, there is a favorably low risk for lung toxicity, cardiac toxicity, brachial plexopathy, or induction of a second malignancy, late chronic non-healing soft tissue wound.    The patient asked good questions which I answered to her satisfaction. She is enthusiastic about proceeding with treatment. A consent form has been signed and placed in her chart.  Will proceed with CT simulation today.  On date of  service, in total, I spent 30 minutes on this encounter. Patient was seen in person.  _____________________________________   Eppie Gibson, MD

## 2020-02-10 ENCOUNTER — Other Ambulatory Visit (HOSPITAL_COMMUNITY)
Admission: RE | Admit: 2020-02-10 | Discharge: 2020-02-10 | Disposition: A | Discharge status: Home / Self Care | Payer: Medicare Other | Source: Ambulatory Visit | Attending: Surgery | Admitting: Surgery

## 2020-02-10 ENCOUNTER — Encounter: Payer: Self-pay | Admitting: Physical Therapy

## 2020-02-10 ENCOUNTER — Other Ambulatory Visit: Payer: Self-pay

## 2020-02-10 ENCOUNTER — Ambulatory Visit: Payer: Medicare Other | Attending: Surgery | Admitting: Physical Therapy

## 2020-02-10 ENCOUNTER — Encounter: Payer: Self-pay | Admitting: *Deleted

## 2020-02-10 DIAGNOSIS — Z483 Aftercare following surgery for neoplasm: Secondary | ICD-10-CM | POA: Diagnosis not present

## 2020-02-10 DIAGNOSIS — Z20822 Contact with and (suspected) exposure to covid-19: Secondary | ICD-10-CM | POA: Insufficient documentation

## 2020-02-10 DIAGNOSIS — Z01812 Encounter for preprocedural laboratory examination: Secondary | ICD-10-CM | POA: Diagnosis not present

## 2020-02-10 DIAGNOSIS — Z171 Estrogen receptor negative status [ER-]: Secondary | ICD-10-CM | POA: Diagnosis not present

## 2020-02-10 DIAGNOSIS — Z510 Encounter for antineoplastic radiation therapy: Secondary | ICD-10-CM | POA: Diagnosis not present

## 2020-02-10 DIAGNOSIS — R293 Abnormal posture: Secondary | ICD-10-CM | POA: Diagnosis not present

## 2020-02-10 DIAGNOSIS — C50512 Malignant neoplasm of lower-outer quadrant of left female breast: Secondary | ICD-10-CM | POA: Diagnosis not present

## 2020-02-10 LAB — SARS CORONAVIRUS 2 (TAT 6-24 HRS): SARS Coronavirus 2: NEGATIVE

## 2020-02-10 NOTE — Patient Instructions (Signed)
            [facility]         [address], [place] [address]         [phone]         Annia Friendly, PT, CLT   After Breast Cancer Class It is recommended you attend the ABC class to be educated on lymphedema risk reduction. This class is free of charge and lasts for 1 hour. It is a 1-time class. You are scheduled for October 4th. We will send you a link to join the meeting.   Scar massage Begin gentle massage with coconut oil on both incisions - just a few minutes each. You will need to avoid doing this before going to radiation - they don't want any creams or lotions on your skin.   Compression garment You would benefit from wearing a sports bra for a few weeks to decrease swelling in left breast.   Home exercise Program Continue exercises for shoulder range of motion through the end of radiation. AND WALK MORE!   Follow up PT: No PT needed right now. Call me if something changes.

## 2020-02-10 NOTE — Therapy (Signed)
[place], [place], [address] Phone: [phone]   Fax:  [phone]  Physical Therapy Treatment  Patient Details  Name: Julie Thompson MRN: [id number] Date of Birth: [birth year] Referring Provider (PT): Dr. Donnie Mesa   Encounter Date: 02/10/2020   PT End of Session - 02/10/20 1129    Visit Number 2    Number of Visits 2    PT Start Time 1056    PT Stop Time 1125    PT Time Calculation (min) 29 min    Activity Tolerance Patient tolerated treatment well    Behavior During Therapy [facility] for tasks assessed/performed           Past Medical History:  Diagnosis Date  . Arthritis 2012  . Asthma    no problems now  . Family history of basal cell carcinoma   . Family history of breast cancer   . Family history of colon cancer   . Family history of kidney cancer   . Family history of leukemia   . Family history of peritoneal cancer   . GERD (gastroesophageal reflux disease)   . H/O hiatal hernia   . Hyperlipidemia   . Hypothyroidism   . IBS (irritable bowel syndrome)   . lt breast ca dx'd 07/10/19    Past Surgical History:  Procedure Laterality Date  . ABDOMINAL HYSTERECTOMY     age 56  . BREAST LUMPECTOMY WITH RADIOACTIVE SEED AND AXILLARY LYMPH NODE DISSECTION Left 01/16/2020   Procedure: LEFT BREAST LUMPECTOMY WITH RADIOACTIVE SEED AND TARGETED AXILLARY LYMPH NODE DISSECTION;  Surgeon: Donnie Mesa, MD;  Location: [facility];  Service: General;  Laterality: Left;  LMA  . BREAST SURGERY    . DILATION AND CURETTAGE OF UTERUS     2 miscarriages age 18 & 25  . FLEXIBLE SIGMOIDOSCOPY N/A 10/22/2013   Procedure: FLEXIBLE SIGMOIDOSCOPY;  Surgeon: Garlan Fair, MD;  Location: [facility];  Service: Endoscopy;  Laterality: N/A;  . KNEE ARTHROSCOPY Left   . PORT-A-CATH REMOVAL N/A 01/16/2020   Procedure: REMOVAL PORT-A-CATH;  Surgeon: Donnie Mesa, MD;  Location: [facility];  Service: General;   Laterality: N/A;  . PORTACATH PLACEMENT Right 07/29/2019   Procedure: INSERTION PORT-A-CATH WITH ULTRASOUND;  Surgeon: Donnie Mesa, MD;  Location: [facility];  Service: General;  Laterality: Right;  . TONSILLECTOMY     age 55    There were no vitals filed for this visit.   Subjective Assessment - 02/10/20 1058    Subjective Patient underwent left lumpectomy and sentinel node biopsy (1/1 positive) on 01/16/2020. Final biopsy was HER2 positive. She undewent neoadjuvant chemotherapy for 5 months prior to surgery. She will have her port replaced on 02/13/2020 for Herceptin.    Pertinent History Patient was diagnosed on 06/21/2019 with left grade III invasive ductal carcinoma breast cancer. It was triple negative with a Ki67 of 60% but final path after chemo was HER2 positive. Patient underwent left lumpectomy and sentinel node biopsy (1/1 positive) on 01/16/2020. Final biopsy was HER2 positive.    Patient Stated Goals See if my arm is ok    Currently in Pain? No/denies              [facility] PT Assessment - 02/10/20 0001      Assessment   Medical Diagnosis s/p left lumpectomy and SLNB    Referring Provider (PT) Dr. Donnie Mesa    Onset Date/Surgical Date 01/16/20    Hand Dominance Right  Prior Therapy Baseline assessment      Precautions   Precautions Other (comment)    Precaution Comments recent surgery      Restrictions   Weight Bearing Restrictions No      Balance Screen   Has the patient fallen in the past 6 months No    Has the patient had a decrease in activity level because of a fear of falling?  No    Is the patient reluctant to leave their home because of a fear of falling?  No      Home Environment   Living Environment Private residence    Living Arrangements Spouse/significant other;Parent   Husband and 72 y.o. father   Available Help at Discharge Family      Prior Function   Level of [place] Retired    Leisure She  walks 5 days per week for ~ 25 min/day      Cognition   Overall Cognitive Status Within Functional Limits for tasks assessed      Observation/Other Assessments   Observations Left breast and axillary incisions appear to be well healed and are slightly firm but no redness noted. Mild edema present left inferior breast.      Posture/Postural Control   Posture/Postural Control Postural limitations    Postural Limitations Rounded Shoulders;Forward head      ROM / Strength   AROM / PROM / Strength AROM      AROM   AROM Assessment Site Shoulder    Right/Left Shoulder Left    Left Shoulder Extension 55 Degrees    Left Shoulder Flexion 131 Degrees    Left Shoulder ABduction 134 Degrees    Left Shoulder Internal Rotation 60 Degrees    Left Shoulder External Rotation 64 Degrees      Strength   Overall Strength Within functional limits for tasks performed             LYMPHEDEMA/ONCOLOGY QUESTIONNAIRE - 02/10/20 0001      Type   Cancer Type Left breast cancer      Surgeries   Lumpectomy Date 01/16/20    Sentinel Lymph Node Biopsy Date 01/16/20    Number Lymph Nodes Removed 1      Treatment   Active Chemotherapy Treatment No    Past Chemotherapy Treatment Yes    Date 12/17/19   07/30/2019-12/17/2019   Active Radiation Treatment No    Past Radiation Treatment No    Current Hormone Treatment No    Past Hormone Therapy No      What other symptoms do you have   Are you Having Heaviness or Tightness No    Are you having Pain No    Are you having pitting edema No    Is it Hard or Difficult finding clothes that fit No    Do you have infections No    Is there Decreased scar mobility No    Stemmer Sign No      Lymphedema Assessments   Lymphedema Assessments Upper extremities      Right Upper Extremity Lymphedema   10 cm Proximal to Olecranon Process 35.8 cm    Olecranon Process 26.8 cm    10 cm Proximal to Ulnar Styloid Process 24 cm    Just Proximal to Ulnar Styloid  Process 16.9 cm    Across Hand at [employer] 16.9 cm    At [place] of 2nd Digit 5.8 cm      Left Upper Extremity  Lymphedema   10 cm Proximal to Olecranon Process 37.8 cm    Olecranon Process 25.6 cm    10 cm Proximal to Ulnar Styloid Process 22.9 cm    Just Proximal to Ulnar Styloid Process 17.1 cm    Across Hand at [employer] 16.4 cm    At [place] of 2nd Digit 5.5 cm              Quick Dash - 02/10/20 0001    Open a tight or new jar No difficulty    Do heavy household chores (wash walls, wash floors) No difficulty    Carry a shopping bag or briefcase No difficulty    Wash your back No difficulty    Use a knife to cut food No difficulty    Recreational activities in which you take some force or impact through your arm, shoulder, or hand (golf, hammering, tennis) No difficulty    During the past week, to what extent has your arm, shoulder or hand problem interfered with your normal social activities with family, friends, neighbors, or groups? Not at all    During the past week, to what extent has your arm, shoulder or hand problem limited your work or other regular daily activities Not at all    Arm, shoulder, or hand pain. None    Tingling (pins and needles) in your arm, shoulder, or hand None    Difficulty Sleeping No difficulty    DASH Score 0 %                          PT Education - 02/10/20 1124    Education Details Follow up care    Person(s) Educated Patient    Methods Explanation;Handout    Comprehension Verbalized understanding               PT Long Term Goals - 02/10/20 1133      PT LONG TERM GOAL #1   Title Patient will demonstrate she has regained shoulder ROM and function post operatively compared to baselines.    Time 8    Period Weeks    Status Achieved                 Plan - 02/10/20 1129    Clinical Impression Statement Patient was seen today for a f/u visit s/p left lumpectomy and SLNB. She is doing very well with  her shoulder ROM and function returning to baseline. She has some mild edema present in her left inferior breast but otherwise has no deficits. Unfortunately her final pathology showed HER2 positive breast cancer so she will have her port replaced 02/13/2020 and begin Herceptin and then radiation next week. She was encouraged to walk more minutes and more days/week and also to attend the After Breast Cancer class. Otherwise she has PT needs at this time.    PT Treatment/Interventions ADLs/Self Care Home Management;Therapeutic exercise;Patient/family education    PT Next Visit Plan D/C    PT Home Exercise Plan Post op shoulder ROM HEP    Consulted and Agree with Plan of Care Patient           Patient will benefit from skilled therapeutic intervention in order to improve the following deficits and impairments:  Postural dysfunction, Decreased range of motion, Decreased knowledge of precautions, Impaired UE functional use, Pain  Visit Diagnosis: Malignant neoplasm of lower-outer quadrant of left breast of female, estrogen receptor negative (HCC)  Abnormal posture  Aftercare following surgery for neoplasm     Problem List Patient Active Problem List   Diagnosis Date Noted  . Port-A-Cath in place 10/15/2019  . Aortic calcification ([place]) 08/06/2019  . Genetic testing 07/23/2019  . Family history of peritoneal cancer   . Family history of breast cancer   . Family history of kidney cancer   . Family history of colon cancer   . Family history of leukemia   . Family history of basal cell carcinoma   . Malignant neoplasm of lower-outer quadrant of left breast of female, estrogen receptor negative ([place]) 07/11/2019   PHYSICAL THERAPY DISCHARGE SUMMARY  Visits from Start of Care: 2  Current functional level related to goals / functional outcomes: Goals met. See above   Remaining deficits: Mild edema present left inferior breast.   Education / Equipment: HEP and lymphedema education    Plan: Patient agrees to discharge.  Patient goals were met. Patient is being discharged due to meeting the stated rehab goals.  ?????         Annia Friendly, [place] 02/10/20 11:36 AM  [place], [place], [address] Phone: [phone]   Fax:  [phone]  Name: Julie Thompson MRN: [id number] Date of Birth: Dec 15, 1945

## 2020-02-10 NOTE — Progress Notes (Signed)
Surgical soap given with instructions, pt verbalized understanding.  

## 2020-02-11 NOTE — Progress Notes (Signed)
CARDIO-ONCOLOGY CLINIC CONSULT NOTE  Referring Physician: Dr. Antoine Primas, MD as PCP - General (Internal Medicine)  HPI:  [name] is 74 y.o. female with left breast cancer referred by Dr. Jana Thompson for enrollment into the Cardio-Oncology program.  Has h/o hyperlipidemia, HTN and hypothyroidism. No known cardiac disease   Oncology history:   (1) status post left breast lower outer quadrant biopsy 07/09/2019 for a clinical T2 N1, stage IIIB invasive ductal carcinoma, grade 3, triple negative, with an MIB-1 of 60%             (a) chest CT scan and bone scan 07/31/2019 showed no evidence of metastatic disease             (b) PET scan 11/21/2019 shows a 1.2 cm nodule in the left breast with an SUV max of 2.7, aortic calcification, but no lung liver or bone lesions, no evidence of metastases  (2) treated with neoadjuvant chemotherapy consisting of cyclophosphamide and doxorubicin in dose dense fashion x4 started 07/30/2019, completed 09/10/2019, followed by weekly carboplatin and paclitaxel x12 started 09/24/2019             (a) cycle 6 carboplatin/paclitaxel delayed 2 weeks because of neutropenia: granix added             (b) cycle 12 of carboplatin/paclitaxel omitted secondary to persistent low counts  (3) left lumpectomy with targeted axillary lymph node dissection 01/16/2020 showed a ypTIc ypN1 invasive ductal carcinoma, grade 3             (a) a single axillary lymph node was removed             (b) repeat prognostic panel on the surgical specimen showed the tumor to be estrogen and progesterone receptor negative, MIB-110%, and HER-2 positive with a signals ratio of 2.97 and a copy number per cell 4.90.  (4) s/p XRT  (5) T-DM1/ [place] to start 02/18/2020, to be repeated Q3W x 14 weeks             Feels great. No CP. Occasional SOB with activity when she is running around. Minimal LE edema.   Echo today 02/12/20 EF 60-65% Grade I DD mild AoV thickening GLS  -22.7%Personally reviewed   ECHO: 1/21 (pre-chemo):  EF 60-65% Grade I DD Reviewed personally.    Review of Systems: [y] = yes, [ ]  = no   General: Weight gain [ ] ; Weight loss [ ] ; Anorexia [ ] ; Fatigue [ ] ; Fever [ ] ; Chills [ ] ; Weakness [ ]   Cardiac: Chest pain/pressure [ ] ; Resting SOB [ ] ; Exertional SOB Blue.Reese ]; Orthopnea [ ] ; Pedal Edema [ ] ; Palpitations [ ] ; Syncope [ ] ; Presyncope [ ] ; Paroxysmal nocturnal dyspnea[ ]   Pulmonary: Cough [ ] ; Wheezing[ ] ; Hemoptysis[ ] ; Sputum [ ] ; Snoring [ ]   GI: Vomiting[ ] ; Dysphagia[ ] ; Melena[ ] ; Hematochezia [ ] ; Heartburn[ ] ; Abdominal pain [ y]; Constipation [ ] ; Diarrhea [ ] ; BRBPR [ ]   GU: Hematuria[ ] ; Dysuria [ ] ; Nocturia[ ]   Vascular: Pain in legs with walking [ ] ; Pain in feet with lying flat [ ] ; Non-healing sores [ ] ; Stroke [ ] ; TIA [ ] ; Slurred speech [ ] ;  Neuro: Headaches[ ] ; Vertigo[ ] ; Seizures[ ] ; Paresthesias[ ] ;Blurred vision [ ] ; Diplopia [ ] ; Vision changes [ ]   Ortho/Skin: Arthritis [ y]; Joint pain [ y]; Muscle pain [ ] ; Joint swelling [ ] ; Back Pain [ ] ; Rash [ ]   Psych: Depression[ ] ; Anxiety[ ]   Heme: Bleeding problems [ ] ; Clotting disorders [ ] ; Anemia [ ]   Endocrine: Diabetes [ ] ; Thyroid dysfunction[ ]    Past Medical History:  Diagnosis Date  . Arthritis 2012  . Asthma    no problems now  . Family history of basal cell carcinoma   . Family history of breast cancer   . Family history of colon cancer   . Family history of kidney cancer   . Family history of leukemia   . Family history of peritoneal cancer   . GERD (gastroesophageal reflux disease)   . H/O hiatal hernia   . Hyperlipidemia   . Hypothyroidism   . IBS (irritable bowel syndrome)   . lt breast ca dx'd 07/10/19    Current Outpatient Medications  Medication Sig Dispense Refill  . acetaminophen (TYLENOL) 650 MG CR tablet Take 650 mg by mouth every 8 (eight) hours as needed for pain.    Marland Kitchen aspirin EC 81 MG tablet Take 81 mg by mouth daily.    Marland Kitchen  atorvastatin (LIPITOR) 20 MG tablet Take 20 mg by mouth daily.    . calcium citrate-vitamin D (CITRACAL+D) 315-200 MG-UNIT tablet Take 1 tablet by mouth 2 (two) times daily.    . Cholecalciferol (VITAMIN D) 2000 UNITS tablet Take 2,000 Units by mouth daily.    Marland Kitchen co-enzyme Q-10 30 MG capsule Take 30 mg by mouth 3 (three) times daily.     Marland Kitchen esomeprazole (NEXIUM) 20 MG capsule Take 20 mg by mouth daily at 12 noon.    . famotidine (PEPCID) 20 MG tablet Take 20 mg by mouth daily as needed for heartburn or indigestion.     Marland Kitchen FIBER PO Take 1 tablet by mouth daily.    Marland Kitchen levothyroxine (SYNTHROID) 50 MCG tablet Take 50 mcg by mouth daily before breakfast.     . lidocaine-prilocaine (EMLA) cream Apply 1 application topically as needed. 30 g 0  . loratadine (CLARITIN) 10 MG tablet Take 1 tablet (10 mg total) by mouth daily. 60 tablet 1  . Multiple Vitamin (MULTIVITAMIN WITH MINERALS) TABS tablet Take 1 tablet by mouth daily.    . nabumetone (RELAFEN) 500 MG tablet Take 500 mg by mouth 2 (two) times daily.    . Omega-3 Fatty Acids (FISH OIL) 1200 MG CAPS Take 1,200 mg by mouth daily.     No current facility-administered medications for this encounter.    Allergies  Allergen Reactions  . Sulfa Antibiotics Rash  . Penicillins Hives      Social History   Socioeconomic History  . Marital status: Married    Spouse name: Not on file  . Number of children: Not on file  . Years of education: Not on file  . Highest education level: Not on file  Occupational History  . Not on file  Tobacco Use  . Smoking status: Never Smoker  . Smokeless tobacco: Never Used  Vaping Use  . Vaping Use: Never used  Substance and Sexual Activity  . Alcohol use: Never  . Drug use: No  . Sexual activity: Not Currently    Birth control/protection: Surgical  Other Topics Concern  . Not on file  Social History Narrative  . Not on file   Social Determinants of Health   Financial Resource Strain:   . Difficulty of  Paying Living Expenses:   Food Insecurity:   . Worried About Charity fundraiser in the Last Year:   . [place] in the Last Year:  Transportation Needs:   . Film/video editor (Medical):   Marland Kitchen Lack of Transportation (Non-Medical):   Physical Activity:   . Days of Exercise per Week:   . Minutes of Exercise per Session:   Stress:   . Feeling of Stress :   Social Connections:   . Frequency of Communication with Friends and Family:   . Frequency of Social Gatherings with Friends and Family:   . Attends Religious Services:   . Active Member of Clubs or Organizations:   . Attends Archivist Meetings:   Marland Kitchen Marital Status:   Intimate Partner Violence:   . Fear of Current or Ex-Partner:   . Emotionally Abused:   Marland Kitchen Physically Abused:   . Sexually Abused:       Family History  Problem Relation Age of Onset  . Breast cancer Maternal Aunt        dx. in her 26s  . Cancer Mother 28       peritoneal cancer, death certificate lists ovarian cancer  . Colon cancer Paternal Uncle        dx. late 59s or early 54s  . Basal cell carcinoma Sister 59       a second diagnosed in her 49s  . Kidney cancer Maternal Grandmother 82  . Diabetes Paternal Grandmother   . Heart Problems Paternal Grandmother   . Leukemia Paternal Uncle        dx. early 86s    Vitals:   02/12/20 0953  BP: (!) 144/94  Pulse: 87  SpO2: 96%  Weight: 85 kg (187 lb 6.4 oz)    PHYSICAL EXAM: General:  Well appearing. No respiratory difficulty HEENT: normal. Alopecia Neck: supple. no JVD. Carotids 2+ bilat; no bruits. No lymphadenopathy or thryomegaly appreciated. Cor: PMI nondisplaced. Regular rate & rhythm. No rubs, gallops or murmurs. Lungs: clear Abdomen: soft, nontender, nondistended. No hepatosplenomegaly. No bruits or masses. Good bowel sounds. Extremities: no cyanosis, clubbing, rash, trace edema Neuro: alert & oriented x 3, cranial nerves grossly intact. moves all 4 extremities w/o  difficulty. Affect pleasant.  ECG: NSR 82 Normal Personally reviewed     ASSESSMENT & PLAN: 1. Left Breast Cancer - initial biopsy triple negative - treated 1/21 with 4 doses of doxorubicin-based regimen - s/p left lumpectomy 7/21 with positive margins which were HER-2+ - s/p XRT - pending TDM-1 based therpy - echo today (02/13/20)  EF 60-65% Grade I DD mild AoV thickening GLS -22.7%Personally reviewed - Explained incidence of Herceptin cardiotoxicity in setting of previous doxorubicin therapy (up to 30%) and role of Cardio-oncology clinic at length. Echo images reviewed personally. All parameters stable. Reviewed signs and symptoms of HF to look for. Continue Herceptin. Follow-up with echo in 3 months.  2. HTN - BP a bit elevated today. Will follow can start anti-HTN regimen as needed - BP at cancer center has been running 138-158 in general .  - will start low dose spironolactone. Follow K closely   Glori Bickers, MD  10:26 AM

## 2020-02-12 ENCOUNTER — Ambulatory Visit (HOSPITAL_BASED_OUTPATIENT_CLINIC_OR_DEPARTMENT_OTHER): Payer: Medicare Other

## 2020-02-12 ENCOUNTER — Encounter (HOSPITAL_COMMUNITY): Payer: Self-pay | Admitting: Internal Medicine

## 2020-02-12 ENCOUNTER — Other Ambulatory Visit: Payer: Self-pay

## 2020-02-12 ENCOUNTER — Ambulatory Visit (HOSPITAL_COMMUNITY)
Admission: RE | Admit: 2020-02-12 | Discharge: 2020-02-12 | Disposition: A | Discharge status: Home / Self Care | Payer: Medicare Other | Source: Ambulatory Visit | Attending: Internal Medicine | Admitting: Internal Medicine

## 2020-02-12 VITALS — BP 144/94 | HR 87 | Wt 187.4 lb

## 2020-02-12 DIAGNOSIS — Z803 Family history of malignant neoplasm of breast: Secondary | ICD-10-CM | POA: Insufficient documentation

## 2020-02-12 DIAGNOSIS — Z791 Long term (current) use of non-steroidal anti-inflammatories (NSAID): Secondary | ICD-10-CM | POA: Diagnosis not present

## 2020-02-12 DIAGNOSIS — J45909 Unspecified asthma, uncomplicated: Secondary | ICD-10-CM | POA: Insufficient documentation

## 2020-02-12 DIAGNOSIS — E785 Hyperlipidemia, unspecified: Secondary | ICD-10-CM | POA: Insufficient documentation

## 2020-02-12 DIAGNOSIS — C50512 Malignant neoplasm of lower-outer quadrant of left female breast: Secondary | ICD-10-CM | POA: Diagnosis not present

## 2020-02-12 DIAGNOSIS — M1990 Unspecified osteoarthritis, unspecified site: Secondary | ICD-10-CM | POA: Insufficient documentation

## 2020-02-12 DIAGNOSIS — I10 Essential (primary) hypertension: Secondary | ICD-10-CM | POA: Diagnosis not present

## 2020-02-12 DIAGNOSIS — I110 Hypertensive heart disease with heart failure: Secondary | ICD-10-CM | POA: Diagnosis not present

## 2020-02-12 DIAGNOSIS — Z7982 Long term (current) use of aspirin: Secondary | ICD-10-CM | POA: Insufficient documentation

## 2020-02-12 DIAGNOSIS — Z79899 Other long term (current) drug therapy: Secondary | ICD-10-CM | POA: Diagnosis not present

## 2020-02-12 DIAGNOSIS — Z882 Allergy status to sulfonamides status: Secondary | ICD-10-CM | POA: Diagnosis not present

## 2020-02-12 DIAGNOSIS — E039 Hypothyroidism, unspecified: Secondary | ICD-10-CM | POA: Insufficient documentation

## 2020-02-12 DIAGNOSIS — Z171 Estrogen receptor negative status [ER-]: Secondary | ICD-10-CM

## 2020-02-12 DIAGNOSIS — Z79890 Hormone replacement therapy (postmenopausal): Secondary | ICD-10-CM | POA: Diagnosis not present

## 2020-02-12 DIAGNOSIS — Z8349 Family history of other endocrine, nutritional and metabolic diseases: Secondary | ICD-10-CM | POA: Diagnosis not present

## 2020-02-12 DIAGNOSIS — Z5181 Encounter for therapeutic drug level monitoring: Secondary | ICD-10-CM | POA: Insufficient documentation

## 2020-02-12 DIAGNOSIS — Z880 Allergy status to penicillin: Secondary | ICD-10-CM | POA: Insufficient documentation

## 2020-02-12 DIAGNOSIS — I509 Heart failure, unspecified: Secondary | ICD-10-CM | POA: Insufficient documentation

## 2020-02-12 DIAGNOSIS — C50912 Malignant neoplasm of unspecified site of left female breast: Secondary | ICD-10-CM | POA: Diagnosis not present

## 2020-02-12 DIAGNOSIS — I351 Nonrheumatic aortic (valve) insufficiency: Secondary | ICD-10-CM | POA: Insufficient documentation

## 2020-02-12 LAB — ECHOCARDIOGRAM COMPLETE
Area-P 1/2: 3.65 cm2
P 1/2 time: 508 msec
S' Lateral: 3.1 cm

## 2020-02-12 MED ORDER — FLEET ENEMA 7-19 GM/118ML RE ENEM
1.0000 | ENEMA | Freq: Once | RECTAL | Status: DC
  Filled 2020-02-12: qty 1

## 2020-02-12 MED ORDER — SPIRONOLACTONE 25 MG PO TABS
12.5000 mg | ORAL_TABLET | Freq: Every day | ORAL | 3 refills | Status: AC
Start: 2020-02-12 — End: ?

## 2020-02-12 NOTE — Addendum Note (Signed)
Encounter addended by: Scarlette Calico, RN on: 02/12/2020 10:34 AM  Actions taken: Order list changed, Clinical Note Signed

## 2020-02-12 NOTE — Addendum Note (Signed)
Encounter addended by: Scarlette Calico, RN on: 02/12/2020 10:41 AM  Actions taken: Order list changed, Diagnosis association updated

## 2020-02-12 NOTE — Patient Instructions (Signed)
Start Spironolactone 12.5 mg (1/2 tab) Daily  Labs next week at the [facility] physician recommends that you schedule a follow-up appointment in: 3 months with echocardiogram  If you have any questions or concerns before your next appointment please send [place] a message through [place] or call our office at [phone].    TO LEAVE A MESSAGE FOR THE NURSE SELECT OPTION 2, PLEASE LEAVE A MESSAGE INCLUDING:  YOUR NAME  DATE OF BIRTH  CALL BACK NUMBER  REASON FOR CALL**this is important as we prioritize the call backs  YOU WILL RECEIVE A CALL BACK THE SAME DAY AS LONG AS YOU CALL BEFORE 4:00 PM

## 2020-02-13 ENCOUNTER — Ambulatory Visit (HOSPITAL_BASED_OUTPATIENT_CLINIC_OR_DEPARTMENT_OTHER)
Admission: RE | Admit: 2020-02-13 | Discharge: 2020-02-13 | Disposition: A | Discharge status: Home / Self Care | Payer: Medicare Other | Attending: Surgery | Admitting: Surgery

## 2020-02-13 ENCOUNTER — Ambulatory Visit (HOSPITAL_BASED_OUTPATIENT_CLINIC_OR_DEPARTMENT_OTHER): Payer: Medicare Other | Admitting: Anesthesiology

## 2020-02-13 ENCOUNTER — Ambulatory Visit (HOSPITAL_COMMUNITY): Payer: Medicare Other

## 2020-02-13 ENCOUNTER — Encounter (HOSPITAL_BASED_OUTPATIENT_CLINIC_OR_DEPARTMENT_OTHER)
Admission: RE | Disposition: A | Discharge status: Home / Self Care | Payer: Self-pay | Source: Home / Self Care | Attending: Surgery

## 2020-02-13 ENCOUNTER — Other Ambulatory Visit: Payer: Self-pay

## 2020-02-13 ENCOUNTER — Encounter (HOSPITAL_BASED_OUTPATIENT_CLINIC_OR_DEPARTMENT_OTHER): Payer: Self-pay | Admitting: Surgery

## 2020-02-13 DIAGNOSIS — Z880 Allergy status to penicillin: Secondary | ICD-10-CM | POA: Insufficient documentation

## 2020-02-13 DIAGNOSIS — Z419 Encounter for procedure for purposes other than remedying health state, unspecified: Secondary | ICD-10-CM

## 2020-02-13 DIAGNOSIS — Z452 Encounter for adjustment and management of vascular access device: Secondary | ICD-10-CM | POA: Diagnosis not present

## 2020-02-13 DIAGNOSIS — K219 Gastro-esophageal reflux disease without esophagitis: Secondary | ICD-10-CM | POA: Diagnosis not present

## 2020-02-13 DIAGNOSIS — Z171 Estrogen receptor negative status [ER-]: Secondary | ICD-10-CM | POA: Insufficient documentation

## 2020-02-13 DIAGNOSIS — C50912 Malignant neoplasm of unspecified site of left female breast: Secondary | ICD-10-CM | POA: Insufficient documentation

## 2020-02-13 DIAGNOSIS — Z882 Allergy status to sulfonamides status: Secondary | ICD-10-CM | POA: Diagnosis not present

## 2020-02-13 DIAGNOSIS — C773 Secondary and unspecified malignant neoplasm of axilla and upper limb lymph nodes: Secondary | ICD-10-CM | POA: Diagnosis not present

## 2020-02-13 DIAGNOSIS — J45909 Unspecified asthma, uncomplicated: Secondary | ICD-10-CM | POA: Diagnosis not present

## 2020-02-13 HISTORY — PX: PORTACATH PLACEMENT: SHX2246

## 2020-02-13 SURGERY — INSERTION, TUNNELED CENTRAL VENOUS DEVICE, WITH PORT
Anesthesia: General | Site: Chest

## 2020-02-13 MED ORDER — EPHEDRINE 5 MG/ML INJ
INTRAVENOUS | Status: AC
  Filled 2020-02-13: qty 10

## 2020-02-13 MED ORDER — MIDAZOLAM HCL 2 MG/2ML IJ SOLN
INTRAMUSCULAR | Status: AC
  Filled 2020-02-13: qty 2

## 2020-02-13 MED ORDER — PROMETHAZINE HCL 25 MG/ML IJ SOLN: 6.2500 mg | INTRAMUSCULAR | Status: DC | PRN

## 2020-02-13 MED ORDER — PROPOFOL 10 MG/ML IV BOLUS
INTRAVENOUS | Status: AC
  Filled 2020-02-13: qty 20

## 2020-02-13 MED ORDER — HEPARIN (PORCINE) IN NACL 1000-0.9 UT/500ML-% IV SOLN
INTRAVENOUS | Status: AC
  Filled 2020-02-13: qty 500

## 2020-02-13 MED ORDER — MIDAZOLAM HCL 5 MG/5ML IJ SOLN
INTRAMUSCULAR | Status: DC | PRN
  Administered 2020-02-13: 1 mg via INTRAVENOUS

## 2020-02-13 MED ORDER — LACTATED RINGERS IV SOLN: INTRAVENOUS | Status: DC

## 2020-02-13 MED ORDER — HEPARIN (PORCINE) IN NACL 2-0.9 UNITS/ML
INTRAMUSCULAR | Status: AC | PRN
  Administered 2020-02-13: 1 via INTRAVENOUS

## 2020-02-13 MED ORDER — FENTANYL CITRATE (PF) 100 MCG/2ML IJ SOLN
INTRAMUSCULAR | Status: DC | PRN
  Administered 2020-02-13: 50 ug via INTRAVENOUS
  Administered 2020-02-13 (×2): 25 ug via INTRAVENOUS

## 2020-02-13 MED ORDER — OXYCODONE HCL 5 MG PO TABS: 5.0000 mg | ORAL_TABLET | Freq: Once | ORAL | Status: DC | PRN

## 2020-02-13 MED ORDER — EPHEDRINE SULFATE 50 MG/ML IJ SOLN
INTRAMUSCULAR | Status: DC | PRN
  Administered 2020-02-13: 5 mg via INTRAVENOUS
  Administered 2020-02-13: 10 mg via INTRAVENOUS

## 2020-02-13 MED ORDER — CHLORHEXIDINE GLUCONATE CLOTH 2 % EX PADS: 6.0000 | MEDICATED_PAD | Freq: Once | CUTANEOUS | Status: DC

## 2020-02-13 MED ORDER — HYDROMORPHONE HCL 1 MG/ML IJ SOLN: 0.2500 mg | INTRAMUSCULAR | Status: DC | PRN

## 2020-02-13 MED ORDER — FENTANYL CITRATE (PF) 100 MCG/2ML IJ SOLN
INTRAMUSCULAR | Status: AC
  Filled 2020-02-13: qty 2

## 2020-02-13 MED ORDER — PROPOFOL 10 MG/ML IV BOLUS
INTRAVENOUS | Status: DC | PRN
  Administered 2020-02-13: 100 mg via INTRAVENOUS

## 2020-02-13 MED ORDER — LIDOCAINE 2% (20 MG/ML) 5 ML SYRINGE
INTRAMUSCULAR | Status: AC
  Filled 2020-02-13: qty 5

## 2020-02-13 MED ORDER — ONDANSETRON HCL 4 MG/2ML IJ SOLN
INTRAMUSCULAR | Status: AC
  Filled 2020-02-13: qty 2

## 2020-02-13 MED ORDER — OXYCODONE HCL 5 MG/5ML PO SOLN: 5.0000 mg | Freq: Once | ORAL | Status: DC | PRN

## 2020-02-13 MED ORDER — LIDOCAINE HCL (CARDIAC) PF 100 MG/5ML IV SOSY
PREFILLED_SYRINGE | INTRAVENOUS | Status: DC | PRN
Start: 2020-02-13 — End: 2020-02-13
  Administered 2020-02-13: 50 mg via INTRATRACHEAL

## 2020-02-13 MED ORDER — BUPIVACAINE HCL (PF) 0.25 % IJ SOLN
INTRAMUSCULAR | Status: DC | PRN
  Administered 2020-02-13: 10 mL

## 2020-02-13 MED ORDER — ONDANSETRON HCL 4 MG/2ML IJ SOLN
INTRAMUSCULAR | Status: DC | PRN
  Administered 2020-02-13: 4 mg via INTRAVENOUS

## 2020-02-13 MED ORDER — HEPARIN SOD (PORK) LOCK FLUSH 100 UNIT/ML IV SOLN
INTRAVENOUS | Status: AC
  Filled 2020-02-13: qty 5

## 2020-02-13 MED ORDER — AMISULPRIDE (ANTIEMETIC) 5 MG/2ML IV SOLN: 10.0000 mg | Freq: Once | INTRAVENOUS | Status: DC | PRN

## 2020-02-13 MED ORDER — CEFAZOLIN SODIUM-DEXTROSE 2-4 GM/100ML-% IV SOLN
2.0000 g | INTRAVENOUS | Status: AC
  Administered 2020-02-13: 2 g via INTRAVENOUS

## 2020-02-13 MED ORDER — HEPARIN SOD (PORK) LOCK FLUSH 100 UNIT/ML IV SOLN
INTRAVENOUS | Status: DC | PRN
  Administered 2020-02-13: 400 [IU] via INTRAVENOUS

## 2020-02-13 MED ORDER — CEFAZOLIN SODIUM-DEXTROSE 2-4 GM/100ML-% IV SOLN
INTRAVENOUS | Status: AC
  Filled 2020-02-13: qty 100

## 2020-02-13 MED ORDER — DEXAMETHASONE SODIUM PHOSPHATE 10 MG/ML IJ SOLN
INTRAMUSCULAR | Status: DC | PRN
  Administered 2020-02-13: 4 mg via INTRAVENOUS

## 2020-02-13 SURGICAL SUPPLY — 52 items
APL PRP STRL LF DISP 70% ISPRP (MISCELLANEOUS) ×1
APL SKNCLS STERI-STRIP NONHPOA (GAUZE/BANDAGES/DRESSINGS) ×1
BAG DECANTER FOR FLEXI CONT (MISCELLANEOUS) ×2 IMPLANT
BENZOIN TINCTURE PRP APPL 2/3 (GAUZE/BANDAGES/DRESSINGS) ×2 IMPLANT
BLADE SURG 11 STRL SS (BLADE) ×2 IMPLANT
BLADE SURG 15 STRL LF DISP TIS (BLADE) ×1 IMPLANT
BLADE SURG 15 STRL SS (BLADE) ×2
CANISTER SUCT 1200ML W/VALVE (MISCELLANEOUS) IMPLANT
CHLORAPREP W/TINT 26 (MISCELLANEOUS) ×2 IMPLANT
CLEANER CAUTERY TIP 5X5 PAD (MISCELLANEOUS) IMPLANT
COVER BACK TABLE 60X90IN (DRAPES) ×2 IMPLANT
COVER MAYO STAND STRL (DRAPES) ×2 IMPLANT
COVER PROBE 5X48 (MISCELLANEOUS) ×2
COVER WAND RF STERILE (DRAPES) IMPLANT
DECANTER SPIKE VIAL GLASS SM (MISCELLANEOUS) IMPLANT
DRAPE C-ARM 42X72 X-RAY (DRAPES) ×2 IMPLANT
DRAPE LAPAROTOMY TRNSV 102X78 (DRAPES) ×2 IMPLANT
DRAPE UTILITY XL STRL (DRAPES) ×2 IMPLANT
DRSG TEGADERM 4X4.75 (GAUZE/BANDAGES/DRESSINGS) ×2 IMPLANT
ELECT REM PT RETURN 9FT ADLT (ELECTROSURGICAL) ×2
ELECTRODE REM PT RTRN 9FT ADLT (ELECTROSURGICAL) ×1 IMPLANT
GAUZE SPONGE 4X4 12PLY STRL LF (GAUZE/BANDAGES/DRESSINGS) IMPLANT
GLOVE BIO SURGEON STRL SZ 6.5 (GLOVE) ×2 IMPLANT
GLOVE BIO SURGEON STRL SZ7 (GLOVE) ×2 IMPLANT
GLOVE BIOGEL PI IND STRL 7.0 (GLOVE) ×1 IMPLANT
GLOVE BIOGEL PI IND STRL 7.5 (GLOVE) ×1 IMPLANT
GLOVE BIOGEL PI INDICATOR 7.0 (GLOVE) ×1
GLOVE BIOGEL PI INDICATOR 7.5 (GLOVE) ×1
GOWN STRL REUS W/ TWL LRG LVL3 (GOWN DISPOSABLE) ×1 IMPLANT
GOWN STRL REUS W/TWL LRG LVL3 (GOWN DISPOSABLE) ×2
IV KIT MINILOC 20X1 SAFETY (NEEDLE) IMPLANT
KIT CVR 48X5XPRB PLUP LF (MISCELLANEOUS) ×1 IMPLANT
KIT PORT POWER 8FR ISP CVUE (Port) ×2 IMPLANT
NDL SAFETY ECLIPSE 18X1.5 (NEEDLE) IMPLANT
NEEDLE HYPO 18GX1.5 SHARP (NEEDLE)
NEEDLE HYPO 25X1 1.5 SAFETY (NEEDLE) ×2 IMPLANT
NEEDLE SPNL 22GX3.5 QUINCKE BK (NEEDLE) IMPLANT
PACK BASIN DAY SURGERY FS (CUSTOM PROCEDURE TRAY) ×2 IMPLANT
PAD CLEANER CAUTERY TIP 5X5 (MISCELLANEOUS)
PENCIL SMOKE EVACUATOR (MISCELLANEOUS) ×2 IMPLANT
SLEEVE SCD COMPRESS KNEE MED (MISCELLANEOUS) ×2 IMPLANT
SPONGE GAUZE 2X2 8PLY STRL LF (GAUZE/BANDAGES/DRESSINGS) ×2 IMPLANT
STRIP CLOSURE SKIN 1/2X4 (GAUZE/BANDAGES/DRESSINGS) ×2 IMPLANT
SUT MON AB 4-0 PC3 18 (SUTURE) ×2 IMPLANT
SUT PROLENE 2 0 CT2 30 (SUTURE) ×2 IMPLANT
SUT VIC AB 3-0 SH 27 (SUTURE) ×2
SUT VIC AB 3-0 SH 27X BRD (SUTURE) ×1 IMPLANT
SYR 5ML LUER SLIP (SYRINGE) ×2 IMPLANT
SYR CONTROL 10ML LL (SYRINGE) ×2 IMPLANT
TOWEL GREEN STERILE FF (TOWEL DISPOSABLE) ×2 IMPLANT
TUBE CONNECTING 20X1/4 (TUBING) IMPLANT
YANKAUER SUCT BULB TIP NO VENT (SUCTIONS) IMPLANT

## 2020-02-13 NOTE — Anesthesia Preprocedure Evaluation (Addendum)
Anesthesia Evaluation  Patient identified by MRN, date of birth, ID band Patient awake    Reviewed: Allergy & Precautions, NPO status , Patient's Chart, lab work & pertinent test results  History of Anesthesia Complications Negative for: history of anesthetic complications  Airway Mallampati: III  TM Distance: >3 FB Neck ROM: Full    Dental no notable dental hx.    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  Reproductive/Obstetrics negative OB ROS                             Anesthesia Physical  Anesthesia Plan  ASA: III  Anesthesia Plan: General   Post-op Pain Management:    Induction: Intravenous  PONV Risk Score and Plan: 3 and Treatment may vary due to age or medical condition, Ondansetron, Dexamethasone and Midazolam  Airway Management Planned: LMA  Additional Equipment: None  Intra-op Plan:   Post-operative Plan: Extubation in OR  Informed Consent: I have reviewed the patients History and Physical, chart, labs and discussed the procedure including the risks, benefits and alternatives for the proposed anesthesia with the patient or authorized representative who has indicated his/her understanding and acceptance.     Dental advisory given  Plan Discussed with: CRNA  Anesthesia Plan Comments: (PAT note by Karoline Caldwell, PA-C: Follows with oncologist 12/24/19 for left breast CA. She has been on weekly  Paclitaxel and carboplatin however her last dose was held due to decreased blood counts. Preop labs notable for WBC 2.0 which is up from 1.5 on 12/24/19 and Hgb 9.4 which is up from 8.7 on 12/24/19. Remainder of labs unremarkable.   Echo for chemo monitoring Jan 2021 showed normal LVEF, grade 1dd, normal valves.   TTE 07/25/19: 1. Left ventricular ejection fraction, by visual estimation, is 60 to  65%. The left ventricle has normal function. There is mildly increased  left ventricular hypertrophy.  2. Left ventricular diastolic parameters are consistent with Grade I  diastolic dysfunction (impaired relaxation).  3. The left ventricle has no regional wall motion abnormalities.  4. Global right ventricle has normal systolic function.The right  ventricular size is normal. No increase in right ventricular wall  thickness.  5. Left atrial size was normal.  6. Right atrial size was normal.  7. The mitral valve is normal in structure. No evidence of mitral valve  regurgitation. No evidence of mitral stenosis.  8. The tricuspid valve is normal in structure.  9. The tricuspid valve is normal in structure. Tricuspid valve  regurgitation is not demonstrated.  10. The aortic valve is normal in structure. Aortic valve regurgitation is  trivial. No evidence of aortic valve sclerosis or stenosis.  11. The pulmonic valve was normal in structure. Pulmonic valve  regurgitation is not visualized.  12. The inferior vena cava is normal in size with greater than 50%  respiratory variability, suggesting right atrial pressure of 3 mmHg.  13. The average left ventricular global longitudinal strain is -  14.8 %. )        Anesthesia Quick Evaluation

## 2020-02-13 NOTE — Interval H&P Note (Signed)
History and Physical Interval Note:  02/13/2020 11:29 AM  Julie Thompson  has presented today for surgery, with the diagnosis of BREAST CANCER WITH AXILLARY METASTASES.  The various methods of treatment have been discussed with the patient and family. After consideration of risks, benefits and other options for treatment, the patient has consented to  Procedure(s): INSERTION PORT-A-CATH WITH ULTRASOUND GUIDANCE (N/A) as a surgical intervention.  The patient's history has been reviewed, patient examined, no change in status, stable for surgery.  I have reviewed the patient's chart and labs.  Questions were answered to the patient's satisfaction.     [name]

## 2020-02-13 NOTE — Transfer of Care (Signed)
Immediate Anesthesia Transfer of Care Note  Patient: Julie Thompson  Procedure(s) Performed: INSERTION PORT-A-CATH WITH ULTRASOUND GUIDANCE (N/A Chest)  Patient Location: PACU  Anesthesia Type:General  Level of Consciousness: drowsy, patient cooperative and responds to stimulation  Airway & Oxygen Therapy: Patient Spontanous Breathing and Patient connected to face mask oxygen  Post-op Assessment: Report given to RN and Post -op Vital signs reviewed and stable  Post vital signs: Reviewed and stable  Last Vitals:  Vitals Value Taken Time  BP 144/83 02/13/20 1350  Temp    Pulse 84 02/13/20 1351  Resp 12 02/13/20 1351  SpO2 100 % 02/13/20 1351  Vitals shown include unvalidated device data.  Last Pain:  Vitals:   02/13/20 1108  TempSrc: Oral  PainSc: 0-No pain      Patients Stated Pain Goal: 3 (63/14/97 0263)  Complications: No complications documented.

## 2020-02-13 NOTE — Progress Notes (Signed)
BP up to 174/73, recycleed then 170/62. Reported to Dr. Sabra Heck. Said unless no pain, it is ok. No new order received.

## 2020-02-13 NOTE — Anesthesia Postprocedure Evaluation (Signed)
Anesthesia Post Note  Patient: Julie Thompson  Procedure(s) Performed: INSERTION PORT-A-CATH WITH ULTRASOUND GUIDANCE (N/A Chest)     Patient location during evaluation: PACU Anesthesia Type: General Level of consciousness: awake and alert Pain management: pain level controlled Vital Signs Assessment: post-procedure vital signs reviewed and stable Respiratory status: spontaneous breathing, nonlabored ventilation and respiratory function stable Cardiovascular status: blood pressure returned to baseline and stable Postop Assessment: no apparent nausea or vomiting Anesthetic complications: no   No complications documented.  Last Vitals:  Vitals:   02/13/20 1422 02/13/20 1452  BP:  (!) 162/103  Pulse: 80 84  Resp: 16 16  Temp:  36.7 C  SpO2: 92% 96%    Last Pain:  Vitals:   02/13/20 1452  TempSrc:   PainSc: 0-No pain                 Lynda Rainwater

## 2020-02-13 NOTE — Anesthesia Procedure Notes (Signed)
Procedure Name: LMA Insertion Date/Time: 02/13/2020 12:59 PM Performed by: Glory Buff, CRNA Pre-anesthesia Checklist: Patient identified, Emergency Drugs available, Suction available and Patient being monitored Patient Re-evaluated:Patient Re-evaluated prior to induction Oxygen Delivery Method: Circle system utilized Preoxygenation: Pre-oxygenation with 100% oxygen Induction Type: IV induction LMA: LMA inserted LMA Size: 4.0 Number of attempts: 1 Placement Confirmation: positive ETCO2 Tube secured with: Tape Dental Injury: Teeth and Oropharynx as per pre-operative assessment

## 2020-02-13 NOTE — Op Note (Signed)
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 She underwent further work-up that revealed a bilobed, dumbell-shaped mass with calcifications with total diameter about 3.7 cm. She also had a single abnormal lymph node in the left axilla. 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 The lymph node was biopsied and revealed metastatic disease. She was seen in the [facility]  On 07/29/19, she underwent ultrasound-guided port placement. The port has been functioning well. She completed her chemotherapy and underwent surgery on 01/16/20. She had left lumpectomy with targeted dissection. Path showed IDC 1.2 cm with clear margins. The lymph node was positive with focal extranodal extension. 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 We are planning to replace her port for Herceptin.    Description of procedure: The patient is brought to the operating room and placed supine position operating table with her arms down by her sides.  After adequate level of general anesthesia was obtained a small roll was placed behind her shoulders.  Her head was tilted towards the left.  We prepped the skin of the chest and the right side of her neck with ChloraPrep and draped in sterile fashion.  A timeout was taken to ensure the proper patient and proper procedure.  I interrogated the right side of her neck with the ultrasound.  I identified the internal jugular vein which was easily compressible as well as the adjacent carotid artery which was not compressible.  Using direct ultrasound  guidance, I cannulated the internal jugular vein with an 18-gauge needle.  A guidewire was passed through the needle into the superior vena cava.  This was confirmed with ultrasound as well as fluoroscopy.  The needle was removed.  I then opened the old incision below the right clavicle.  We recreated the subcutaneous pocket below the right clavicle.  We made a subcutaneous tunnel between the pocket and the initial insertion site.  An 8 French [place] port was then assembled and brought onto the field.  We tunneled this from the subcutaneous pocket up to the neck.  We then estimated the length of the catheter using fluoroscopy.  This was cut to the appropriate length to keep the tip of the catheter near the cavoatrial junction.  The dilator and breakaway sheath were then advanced over the wire under direct fluoroscopic guidance.  The wire and dilator were removed.  The catheter was advanced through the breakaway sheath which was then removed.  Fluoroscopy confirmed that there were no kinks along the course of the catheter.  We were able to easily aspirate blood and were easily able to flush the catheter.  The catheter was secured with 2-0 Prolene sutures.  The subcutaneous pocket was closed with 3-0 Vicryl 4-0 Monocryl.  The insertion site on the side of the neck was closed with 4-0 Monocryl.  Benzoin Steri-Strips were applied.  An occlusive dressing was placed.  Chest x-ray is pending.  Patient was extubated and brought to the recovery room in stable condition.  All sponge, instrument, and needle counts are correct.   Imogene Burn. Georgette Dover, MD, [facility] Surgery  General/ Trauma Surgery  02/13/2020 1:48 PM

## 2020-02-13 NOTE — Discharge Instructions (Addendum)

## 2020-02-14 ENCOUNTER — Encounter (HOSPITAL_BASED_OUTPATIENT_CLINIC_OR_DEPARTMENT_OTHER): Payer: Self-pay | Admitting: Surgery

## 2020-02-17 ENCOUNTER — Ambulatory Visit
Admission: RE | Admit: 2020-02-17 | Discharge: 2020-02-17 | Disposition: A | Discharge status: Home / Self Care | Payer: Medicare Other | Source: Ambulatory Visit | Attending: Radiation Oncology | Admitting: Radiation Oncology

## 2020-02-17 ENCOUNTER — Other Ambulatory Visit: Payer: Self-pay

## 2020-02-17 DIAGNOSIS — Z510 Encounter for antineoplastic radiation therapy: Secondary | ICD-10-CM | POA: Diagnosis not present

## 2020-02-17 DIAGNOSIS — C50512 Malignant neoplasm of lower-outer quadrant of left female breast: Secondary | ICD-10-CM

## 2020-02-17 MED ORDER — ALRA NON-METALLIC DEODORANT (RAD-ONC)
1.0000 "application " | Freq: Once | TOPICAL | Status: AC
  Administered 2020-02-17: 1 via TOPICAL

## 2020-02-17 MED ORDER — RADIAPLEXRX EX GEL: Freq: Once | CUTANEOUS | Status: AC

## 2020-02-17 NOTE — Progress Notes (Signed)

## 2020-02-17 NOTE — Progress Notes (Signed)
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 returns for f/u and treatment as scheduled. She had port placed last week and it is a little sore. Also had repeat echo last week.  She has recovered well from lumpectomy, she started radiation yesterday, right breast is a little pink.  She is using cream.  She has normal energy and appetite, she has resumed walking, feet feel tight like a band across it and occasionally "like they are asleep, this is worse at night and improved over the last few days.  No fever, chills, cough, chest pain, dyspnea, N/V/C/D, bleeding.    MEDICAL HISTORY:  Past Medical History:  Diagnosis Date  . Arthritis 2012  . Asthma    no problems now  . Family history of basal cell carcinoma   . Family history of breast cancer   . Family history of colon cancer   . Family history of kidney cancer   . Family history of leukemia   . Family history of peritoneal cancer   . GERD (gastroesophageal reflux disease)   . H/O hiatal hernia   . Hyperlipidemia   . Hypothyroidism   . IBS (irritable bowel syndrome)   . lt breast  ca dx'd 07/10/19    SURGICAL HISTORY: Past Surgical History:  Procedure Laterality Date  . ABDOMINAL HYSTERECTOMY     age 56  . BREAST LUMPECTOMY WITH RADIOACTIVE SEED AND AXILLARY LYMPH NODE DISSECTION Left 01/16/2020   Procedure: LEFT BREAST LUMPECTOMY WITH RADIOACTIVE SEED AND TARGETED AXILLARY LYMPH NODE DISSECTION;  Surgeon: Donnie Mesa, MD;  Location: [facility];  Service: General;  Laterality: Left;  LMA  . BREAST SURGERY    . DILATION AND CURETTAGE OF UTERUS     2 miscarriages age 21 & 30  . FLEXIBLE SIGMOIDOSCOPY N/A 10/22/2013   Procedure: FLEXIBLE SIGMOIDOSCOPY;  Surgeon: Garlan Fair, MD;  Location: [facility];  Service: Endoscopy;  Laterality: N/A;  . KNEE ARTHROSCOPY Left   . PORT-A-CATH REMOVAL N/A 01/16/2020   Procedure: REMOVAL PORT-A-CATH;  Surgeon: Donnie Mesa, MD;  Location: [place];  Service: General;  Laterality: N/A;  . PORTACATH PLACEMENT Right 07/29/2019   Procedure: INSERTION PORT-A-CATH WITH ULTRASOUND;  Surgeon: Donnie Mesa, MD;  Location: [place];  Service: General;  Laterality: Right;  . PORTACATH PLACEMENT N/A 02/13/2020   Procedure: INSERTION PORT-A-CATH WITH ULTRASOUND GUIDANCE;  Surgeon: Donnie Mesa, MD;  Location: [place];  Service: General;  Laterality: N/A;  . TONSILLECTOMY     age 56    I have reviewed the social history and family history with the patient and they are unchanged from previous note.  ALLERGIES:  is allergic to penicillins and sulfa antibiotics.  MEDICATIONS:  Current  Outpatient Medications  Medication Sig Dispense Refill  . acetaminophen (TYLENOL) 650 MG CR tablet Take 650 mg by mouth every 8 (eight) hours as needed for pain.    Marland Kitchen aspirin EC 81 MG tablet Take 81 mg by mouth daily.    Marland Kitchen atorvastatin (LIPITOR) 20 MG tablet Take 20 mg by mouth daily.    . calcium citrate-vitamin D (CITRACAL+D) 315-200 MG-UNIT tablet Take 1 tablet by mouth 2 (two) times daily.    . Cholecalciferol (VITAMIN D)  2000 UNITS tablet Take 2,000 Units by mouth daily.    Marland Kitchen co-enzyme Q-10 30 MG capsule Take 30 mg by mouth 3 (three) times daily.     Marland Kitchen esomeprazole (NEXIUM) 20 MG capsule Take 20 mg by mouth daily at 12 noon.    . famotidine (PEPCID) 20 MG tablet Take 20 mg by mouth daily as needed for heartburn or indigestion.     Marland Kitchen FIBER PO Take 1 tablet by mouth daily.    Marland Kitchen levothyroxine (SYNTHROID) 50 MCG tablet Take 50 mcg by mouth daily before breakfast.     . lidocaine-prilocaine (EMLA) cream Apply 1 application topically as needed. 30 g 0  . loratadine (CLARITIN) 10 MG tablet Take 1 tablet (10 mg total) by mouth daily. 60 tablet 1  . Multiple Vitamin (MULTIVITAMIN WITH MINERALS) TABS tablet Take 1 tablet by mouth daily.    . nabumetone (RELAFEN) 500 MG tablet Take 500 mg by mouth 2 (two) times daily.    . Omega-3 Fatty Acids (FISH OIL) 1200 MG CAPS Take 1,200 mg by mouth daily.    Marland Kitchen spironolactone (ALDACTONE) 25 MG tablet Take 0.5 tablets (12.5 mg total) by mouth daily. 15 tablet 3   No current facility-administered medications for this visit.   Facility-Administered Medications Ordered in Other Visits  Medication Dose Route Frequency Provider Last Rate Last Admin  . ado-trastuzumab emtansine (KADCYLA) 300 mg in sodium chloride 0.9 % 250 mL chemo infusion  3.6 mg/kg (Treatment Plan Recorded) Intravenous Once Magrinat, Virgie Dad, MD      . heparin lock flush 100 unit/mL  500 Units Intracatheter Once PRN Magrinat, Virgie Dad, MD      . sodium chloride flush (NS) 0.9 % injection 10 mL  10 mL Intracatheter PRN Magrinat, Virgie Dad, MD        PHYSICAL EXAMINATION: ECOG PERFORMANCE STATUS: 0 - Asymptomatic  Vitals:   02/18/20 1034  BP: 130/76  Pulse: 89  Resp: 17  Temp: 98.3 F (36.8 C)  SpO2: 98%   Filed Weights   02/18/20 1034  Weight: 185 lb 1.6 oz (84 kg)    GENERAL:alert, no distress and comfortable SKIN: No rash to exposed skin EYES: sclera clear NECK: Without mass LUNGS: clear with  normal breathing effort HEART: regular rate & rhythm, no lower extremity edema NEURO: alert & oriented x 3 with fluent speech PAC without erythema Breast: S/p left lumpectomy, incisions completely healed.  There is nipple inversion without discharge.  Mild erythema to left central breast, no warmth.  LABORATORY DATA:  I have reviewed the data as listed CBC Latest Ref Rng & Units 02/18/2020 01/24/2020 01/07/2020  WBC 4.0 - 10.5 K/uL 3.9(L) 3.1(L) 2.0(L)  Hemoglobin 12.0 - 15.0 g/dL 11.4(L) 10.8(L) 9.4(L)  Hematocrit 36 - 46 % 35.5(L) 34.5(L) 29.9(L)  Platelets 150 - 400 K/uL 175 173 173     CMP Latest Ref Rng & Units 02/18/2020 01/24/2020 01/07/2020  Glucose 70 - 99 mg/dL 95 101(H) 92  BUN 8 - 23  mg/dL 14 14 18   Creatinine 0.44 - 1.00 mg/dL 0.80 0.80 0.77  Sodium 135 - 145 mmol/L 142 152(H) 143  Potassium 3.5 - 5.1 mmol/L 4.3 4.7 4.2  Chloride 98 - 111 mmol/L 109 113(H) 111  CO2 22 - 32 mmol/L 25 26 24   Calcium 8.9 - 10.3 mg/dL 10.0 9.7 9.0  Total Protein 6.5 - 8.1 g/dL 6.4(L) 6.3(L) 6.0(L)  Total Bilirubin 0.3 - 1.2 mg/dL 0.5 0.5 0.3  Alkaline Phos 38 - 126 U/L 85 83 86  AST 15 - 41 U/L 14(L) 17 16  ALT 0 - 44 U/L 9 11 12       RADIOGRAPHIC STUDIES: I have personally reviewed the radiological images as listed and agreed with the findings in the report. No results found.   ASSESSMENT & PLAN: 74 y.o. woman   1.  Malignant neoplasm of the lower outer quadrant of left breast  -Diagnosed 07/09/2019, invasive ductal carcinoma, grade 3, triple negative with MIB-1 of 60%, cT2N1, clinical stage IIIB -Genetic test showed no pathogenic mutations, VUS in MSH6 gene called c.680G>A.  -Staging work-up CT/bone scan on 07/31/2019 showed no evidence of metastatic disease -MRI biopsy of 2 additional areas in the left breast (central, 07/10/2019, and at 8:30 o'clock on 07/26/2019) are both benign and concordant -biopsy of 2 suspicious areas in the right breast on 07/26/2019 (9:00 and lower outer  quadrant) were benign and concordant -She underwent PET scan on 11/21/2019 which showed a 1.2 cm nodule in the left breast with an SUV of 2.7, aortic calcification, no lung liver or bone lesions, overall no evidence of metastasis -Completed neoadjuvant chemotherapy with AC every 2 2 weeks x 4 followed by weekly carboplatin and paclitaxel from 07/30/2019 to 12/17/2019, (last cycle of carbo/Taxol held for persistent neutropenia) -She underwent definitive lumpectomy with ALND and port removal on 01/16/2020 per Dr. Georgette Dover, path showed a [id number] invasive ductal carcinoma, grade 3; prognostic panel showed ER/PR negative, MIB-1 of 10%, and HER-2 equivocal by IHC and positive by FISH  -Repeat echo 02/12/2020 showed EF 60-65%, stable from baseline echo in 07/2019 -PAC replaced by Dr. Georgette Dover on 02/13/2020 -Adjuvant radiation per Dr. Isidore Moos began on 02/17/2020 -The plan is to begin adjuvant T-DM1/Kadcyla every 3 weeks x14 cycles (1 year), starting 02/18/2020  Disposition: Ms. Bourassa appears stable.  She has recovered well from left lumpectomy.  She tolerated Port-A-Cath placement on 02/13/2020 and began adjuvant radiation on 02/17/2020, s/p 2 treatments.  The left breast has mild erythema, no significant warmth.  It is unusual to have skin changes with 2 doses but her skin may be more sensitive. She is using topicals per rad onc. I encouraged her to monitor the breast for significant erythema, warmth, new fever, chills or other changes which may indicate cellulitis secondary to recent surgery.  The CBC and CMP from today are adequate to proceed with cycle 1 Kadcyla as planned.  She will return for follow-up and cycle 2 in 3 weeks.  All questions were answered. The patient knows to call the clinic with any problems, questions or concerns. No barriers to learning were detected.     Alla Feeling, NP 02/18/20

## 2020-02-18 ENCOUNTER — Inpatient Hospital Stay (HOSPITAL_BASED_OUTPATIENT_CLINIC_OR_DEPARTMENT_OTHER): Payer: Medicare Other | Admitting: Nurse Practitioner

## 2020-02-18 ENCOUNTER — Inpatient Hospital Stay: Payer: Medicare Other | Attending: Oncology

## 2020-02-18 ENCOUNTER — Ambulatory Visit
Admission: RE | Admit: 2020-02-18 | Discharge: 2020-02-18 | Disposition: A | Discharge status: Home / Self Care | Payer: Medicare Other | Source: Ambulatory Visit | Attending: Radiation Oncology | Admitting: Radiation Oncology

## 2020-02-18 ENCOUNTER — Other Ambulatory Visit: Payer: Self-pay

## 2020-02-18 ENCOUNTER — Encounter: Payer: Self-pay | Admitting: Nurse Practitioner

## 2020-02-18 ENCOUNTER — Inpatient Hospital Stay: Payer: Medicare Other

## 2020-02-18 VITALS — BP 130/76 | HR 89 | Temp 98.3°F | Resp 17 | Ht 60.0 in | Wt 185.1 lb

## 2020-02-18 VITALS — BP 151/70 | HR 68 | Temp 98.5°F | Resp 18

## 2020-02-18 DIAGNOSIS — Z7982 Long term (current) use of aspirin: Secondary | ICD-10-CM | POA: Insufficient documentation

## 2020-02-18 DIAGNOSIS — Z8051 Family history of malignant neoplasm of kidney: Secondary | ICD-10-CM | POA: Diagnosis not present

## 2020-02-18 DIAGNOSIS — Z800 Family history of malignant neoplasm of digestive organs: Secondary | ICD-10-CM | POA: Insufficient documentation

## 2020-02-18 DIAGNOSIS — Z171 Estrogen receptor negative status [ER-]: Secondary | ICD-10-CM

## 2020-02-18 DIAGNOSIS — Z510 Encounter for antineoplastic radiation therapy: Secondary | ICD-10-CM | POA: Diagnosis not present

## 2020-02-18 DIAGNOSIS — C50512 Malignant neoplasm of lower-outer quadrant of left female breast: Secondary | ICD-10-CM | POA: Diagnosis not present

## 2020-02-18 DIAGNOSIS — Z95828 Presence of other vascular implants and grafts: Secondary | ICD-10-CM

## 2020-02-18 DIAGNOSIS — C773 Secondary and unspecified malignant neoplasm of axilla and upper limb lymph nodes: Secondary | ICD-10-CM | POA: Insufficient documentation

## 2020-02-18 DIAGNOSIS — Z8049 Family history of malignant neoplasm of other genital organs: Secondary | ICD-10-CM | POA: Insufficient documentation

## 2020-02-18 DIAGNOSIS — J45909 Unspecified asthma, uncomplicated: Secondary | ICD-10-CM | POA: Diagnosis not present

## 2020-02-18 DIAGNOSIS — Z803 Family history of malignant neoplasm of breast: Secondary | ICD-10-CM | POA: Insufficient documentation

## 2020-02-18 DIAGNOSIS — Z90710 Acquired absence of both cervix and uterus: Secondary | ICD-10-CM | POA: Diagnosis not present

## 2020-02-18 DIAGNOSIS — Z79899 Other long term (current) drug therapy: Secondary | ICD-10-CM | POA: Insufficient documentation

## 2020-02-18 DIAGNOSIS — Z9221 Personal history of antineoplastic chemotherapy: Secondary | ICD-10-CM | POA: Diagnosis not present

## 2020-02-18 LAB — CBC WITH DIFFERENTIAL/PLATELET
Abs Immature Granulocytes: 0 10*3/uL (ref 0.00–0.07)
Basophils Absolute: 0 10*3/uL (ref 0.0–0.1)
Basophils Relative: 1 %
Eosinophils Absolute: 0.2 10*3/uL (ref 0.0–0.5)
Eosinophils Relative: 4 %
HCT: 35.5 % — ABNORMAL LOW (ref 36.0–46.0)
Hemoglobin: 11.4 g/dL — ABNORMAL LOW (ref 12.0–15.0)
Immature Granulocytes: 0 %
Lymphocytes Relative: 15 %
Lymphs Abs: 0.6 10*3/uL — ABNORMAL LOW (ref 0.7–4.0)
MCH: 33.4 pg (ref 26.0–34.0)
MCHC: 32.1 g/dL (ref 30.0–36.0)
MCV: 104.1 fL — ABNORMAL HIGH (ref 80.0–100.0)
Monocytes Absolute: 0.4 10*3/uL (ref 0.1–1.0)
Monocytes Relative: 10 %
Neutro Abs: 2.7 10*3/uL (ref 1.7–7.7)
Neutrophils Relative %: 70 %
Platelets: 175 10*3/uL (ref 150–400)
RBC: 3.41 MIL/uL — ABNORMAL LOW (ref 3.87–5.11)
RDW: 13.2 % (ref 11.5–15.5)
WBC: 3.9 10*3/uL — ABNORMAL LOW (ref 4.0–10.5)
nRBC: 0 % (ref 0.0–0.2)

## 2020-02-18 LAB — COMPREHENSIVE METABOLIC PANEL
ALT: 9 U/L (ref 0–44)
AST: 14 U/L — ABNORMAL LOW (ref 15–41)
Albumin: 3.7 g/dL (ref 3.5–5.0)
Alkaline Phosphatase: 85 U/L (ref 38–126)
Anion gap: 8 (ref 5–15)
BUN: 14 mg/dL (ref 8–23)
CO2: 25 mmol/L (ref 22–32)
Calcium: 10 mg/dL (ref 8.9–10.3)
Chloride: 109 mmol/L (ref 98–111)
Creatinine, Ser: 0.8 mg/dL (ref 0.44–1.00)
GFR calc Af Amer: >60 mL/min (ref >60)
GFR calc non Af Amer: >60 mL/min (ref >60)
Glucose, Bld: 95 mg/dL (ref 70–99)
Potassium: 4.3 mmol/L (ref 3.5–5.1)
Sodium: 142 mmol/L (ref 135–145)
Total Bilirubin: 0.5 mg/dL (ref 0.3–1.2)
Total Protein: 6.4 g/dL — ABNORMAL LOW (ref 6.5–8.1)

## 2020-02-18 MED ORDER — DIPHENHYDRAMINE HCL 25 MG PO CAPS
25.0000 mg | ORAL_CAPSULE | Freq: Once | ORAL | Status: AC
  Administered 2020-02-18: 25 mg via ORAL

## 2020-02-18 MED ORDER — HEPARIN SOD (PORK) LOCK FLUSH 100 UNIT/ML IV SOLN
500.0000 [IU] | Freq: Once | INTRAVENOUS | Status: AC | PRN
  Administered 2020-02-18: 500 [IU]
  Filled 2020-02-18: qty 5

## 2020-02-18 MED ORDER — SODIUM CHLORIDE 0.9% FLUSH
10.0000 mL | Freq: Once | INTRAVENOUS | Status: AC
  Administered 2020-02-18: 10 mL
  Filled 2020-02-18: qty 10

## 2020-02-18 MED ORDER — SODIUM CHLORIDE 0.9 % IV SOLN
Freq: Once | INTRAVENOUS | Status: AC
  Filled 2020-02-18: qty 250

## 2020-02-18 MED ORDER — ACETAMINOPHEN 325 MG PO TABS
ORAL_TABLET | ORAL | Status: AC
  Filled 2020-02-18: qty 2

## 2020-02-18 MED ORDER — ACETAMINOPHEN 325 MG PO TABS
650.0000 mg | ORAL_TABLET | Freq: Once | ORAL | Status: AC
  Administered 2020-02-18: 650 mg via ORAL

## 2020-02-18 MED ORDER — SODIUM CHLORIDE 0.9% FLUSH
10.0000 mL | INTRAVENOUS | Status: DC | PRN
  Administered 2020-02-18: 10 mL
  Filled 2020-02-18: qty 10

## 2020-02-18 MED ORDER — DIPHENHYDRAMINE HCL 25 MG PO CAPS
ORAL_CAPSULE | ORAL | Status: AC
  Filled 2020-02-18: qty 1

## 2020-02-18 MED ORDER — SODIUM CHLORIDE 0.9 % IV SOLN
3.6000 mg/kg | Freq: Once | INTRAVENOUS | Status: AC
  Administered 2020-02-18: 300 mg via INTRAVENOUS
  Filled 2020-02-18: qty 15

## 2020-02-18 NOTE — Patient Instructions (Signed)
[place] Discharge Instructions for Patients Receiving Chemotherapy  Today you received the following chemotherapy agents ADO-Trastuzumab emtansine [facility]).  To help prevent nausea and vomiting after your treatment, we encourage you to take your nausea medication as prescribed.   If you develop nausea and vomiting that is not controlled by your nausea medication, call the clinic.   BELOW ARE SYMPTOMS THAT SHOULD BE REPORTED IMMEDIATELY:  *FEVER GREATER THAN 100.5 F  *CHILLS WITH OR WITHOUT FEVER  NAUSEA AND VOMITING THAT IS NOT CONTROLLED WITH YOUR NAUSEA MEDICATION  *UNUSUAL SHORTNESS OF BREATH  *UNUSUAL BRUISING OR BLEEDING  TENDERNESS IN MOUTH AND THROAT WITH OR WITHOUT PRESENCE OF ULCERS  *URINARY PROBLEMS  *BOWEL PROBLEMS  UNUSUAL RASH Items with * indicate a potential emergency and should be followed up as soon as possible.  Feel free to call the clinic should you have any questions or concerns. The clinic phone number is [phone].  Please show the [facility] at check-in to the Emergency Department and triage nurse.  Ado-Trastuzumab Emtansine for injection What is this medicine? ADO-TRASTUZUMAB EMTANSINE (ADD oh traz TOO zuh mab em TAN zine) is a monoclonal antibody combined with chemotherapy. It is used to treat breast cancer. This medicine may be used for other purposes; ask your health care provider or pharmacist if you have questions. COMMON BRAND NAME(S): Kadcyla What should I tell my health care provider before I take this medicine? They need to know if you have any of these conditions:  heart disease  heart failure  infection (especially a virus infection such as chickenpox, cold sores, or herpes)  liver disease  lung or breathing disease, like asthma  tingling of the fingers or toes, or other nerve disorder  an unusual or allergic reaction to ado-trastuzumab emtansine, other medications, foods, dyes, or  preservatives  pregnant or trying to get pregnant  breast-feeding How should I use this medicine? This medicine is for infusion into a vein. It is given by a health care professional in a hospital or clinic setting. Talk to your pediatrician regarding the use of this medicine in children. Special care may be needed. Overdosage: If you think you have taken too much of this medicine contact a poison control center or emergency room at once. NOTE: This medicine is only for you. Do not share this medicine with others. What if I miss a dose? It is important not to miss your dose. Call your doctor or health care professional if you are unable to keep an appointment. What may interact with this medicine? This medicine may also interact with the following medications:  atazanavir  boceprevir  clarithromycin  delavirdine  indinavir  dalfopristin; quinupristin  isoniazid, INH  itraconazole  ketoconazole  nefazodone  nelfinavir  ritonavir  telaprevir  telithromycin  tipranavir  voriconazole This list may not describe all possible interactions. Give your health care provider a list of all the medicines, herbs, non-prescription drugs, or dietary supplements you use. Also tell them if you smoke, drink alcohol, or use illegal drugs. Some items may interact with your medicine. What should I watch for while using this medicine? Visit your doctor for checks on your progress. This drug may make you feel generally unwell. This is not uncommon, as chemotherapy can affect healthy cells as well as cancer cells. Report any side effects. Continue your course of treatment even though you feel ill unless your doctor tells you to stop. You may need blood work done while you are taking  this medicine. Call your doctor or health care professional for advice if you get a fever, chills or sore throat, or other symptoms of a cold or flu. Do not treat yourself. This drug decreases your body's ability  to fight infections. Try to avoid being around people who are sick. Be careful brushing and flossing your teeth or using a toothpick because you may get an infection or bleed more easily. If you have any dental work done, tell your dentist you are receiving this medicine. Avoid taking products that contain aspirin, acetaminophen, ibuprofen, naproxen, or ketoprofen unless instructed by your doctor. These medicines may hide a fever. Do not become pregnant while taking this medicine or for 7 months after stopping it, men with female partners should use contraception during treatment and for 4 months after the last dose. Women should inform their doctor if they wish to become pregnant or think they might be pregnant. There is a potential for serious side effects to an unborn child. Do not breast-feed an infant while taking this medicine or for 7 months after the last dose. Men who have a partner who is pregnant or who is capable of becoming pregnant should use a condom during sexual activity while taking this medicine and for 4 months after stopping it. Men should inform their doctors if they wish to father a child. This medicine may lower sperm counts. Talk to your health care professional or pharmacist for more information. What side effects may I notice from receiving this medicine? Side effects that you should report to your doctor or health care professional as soon as possible:  allergic reactions like skin rash, itching or hives, swelling of the face, lips, or tongue  breathing problems  chest pain or palpitations  fever or chills, sore throat  general ill feeling or flu-like symptoms  light-colored stools  nausea, vomiting  pain, tingling, numbness in the hands or feet  signs and symptoms of bleeding such as bloody or black, tarry stools; red or dark-brown urine; spitting up blood or brown material that looks like coffee grounds; red spots on the skin; unusual bruising or bleeding from  the eye, gums, or nose  swelling of the legs or ankles  yellowing of the eyes or skin Side effects that usually do not require medical attention (report to your doctor or health care professional if they continue or are bothersome):  changes in taste  constipation  dizziness  headache  joint pain  muscle pain  trouble sleeping  unusually weak or tired This list may not describe all possible side effects. Call your doctor for medical advice about side effects. You may report side effects to FDA at 1-800-FDA-1088. Where should I keep my medicine? This drug is given in a hospital or clinic and will not be stored at home. NOTE: This sheet is a summary. It may not cover all possible information. If you have questions about this medicine, talk to your doctor, pharmacist, or health care provider.  2020 Elsevier/Gold Standard (2017-11-17 10:03:15)

## 2020-02-19 ENCOUNTER — Other Ambulatory Visit: Payer: Self-pay

## 2020-02-19 ENCOUNTER — Telehealth: Payer: Self-pay | Admitting: *Deleted

## 2020-02-19 ENCOUNTER — Telehealth: Payer: Self-pay | Admitting: Nurse Practitioner

## 2020-02-19 ENCOUNTER — Ambulatory Visit
Admission: RE | Admit: 2020-02-19 | Discharge: 2020-02-19 | Disposition: A | Discharge status: Home / Self Care | Payer: Medicare Other | Source: Ambulatory Visit | Attending: Radiation Oncology | Admitting: Radiation Oncology

## 2020-02-19 DIAGNOSIS — Z510 Encounter for antineoplastic radiation therapy: Secondary | ICD-10-CM | POA: Diagnosis not present

## 2020-02-19 DIAGNOSIS — C50512 Malignant neoplasm of lower-outer quadrant of left female breast: Secondary | ICD-10-CM | POA: Diagnosis not present

## 2020-02-19 NOTE — Telephone Encounter (Signed)
Scheduled per 8/17 los. Pt is aware of appts r/s from 9/7 to 9/8.

## 2020-02-20 ENCOUNTER — Ambulatory Visit
Admission: RE | Admit: 2020-02-20 | Discharge: 2020-02-20 | Disposition: A | Discharge status: Home / Self Care | Payer: Medicare Other | Source: Ambulatory Visit | Attending: Radiation Oncology | Admitting: Radiation Oncology

## 2020-02-20 ENCOUNTER — Other Ambulatory Visit: Payer: Self-pay

## 2020-02-20 DIAGNOSIS — Z510 Encounter for antineoplastic radiation therapy: Secondary | ICD-10-CM | POA: Diagnosis not present

## 2020-02-20 DIAGNOSIS — M170 Bilateral primary osteoarthritis of knee: Secondary | ICD-10-CM | POA: Diagnosis not present

## 2020-02-20 DIAGNOSIS — E039 Hypothyroidism, unspecified: Secondary | ICD-10-CM | POA: Diagnosis not present

## 2020-02-20 DIAGNOSIS — E7800 Pure hypercholesterolemia, unspecified: Secondary | ICD-10-CM | POA: Diagnosis not present

## 2020-02-20 DIAGNOSIS — C50512 Malignant neoplasm of lower-outer quadrant of left female breast: Secondary | ICD-10-CM | POA: Diagnosis not present

## 2020-02-21 ENCOUNTER — Other Ambulatory Visit: Payer: Self-pay

## 2020-02-21 ENCOUNTER — Ambulatory Visit
Admission: RE | Admit: 2020-02-21 | Discharge: 2020-02-21 | Disposition: A | Discharge status: Home / Self Care | Payer: Medicare Other | Source: Ambulatory Visit | Attending: Radiation Oncology | Admitting: Radiation Oncology

## 2020-02-21 DIAGNOSIS — Z510 Encounter for antineoplastic radiation therapy: Secondary | ICD-10-CM | POA: Diagnosis not present

## 2020-02-21 DIAGNOSIS — C50512 Malignant neoplasm of lower-outer quadrant of left female breast: Secondary | ICD-10-CM | POA: Diagnosis not present

## 2020-02-24 ENCOUNTER — Other Ambulatory Visit: Payer: Self-pay

## 2020-02-24 ENCOUNTER — Ambulatory Visit
Admission: RE | Admit: 2020-02-24 | Discharge: 2020-02-24 | Disposition: A | Discharge status: Home / Self Care | Payer: Medicare Other | Source: Ambulatory Visit | Attending: Radiation Oncology | Admitting: Radiation Oncology

## 2020-02-24 DIAGNOSIS — C50512 Malignant neoplasm of lower-outer quadrant of left female breast: Secondary | ICD-10-CM | POA: Diagnosis not present

## 2020-02-24 DIAGNOSIS — Z510 Encounter for antineoplastic radiation therapy: Secondary | ICD-10-CM | POA: Diagnosis not present

## 2020-02-25 ENCOUNTER — Ambulatory Visit
Admission: RE | Admit: 2020-02-25 | Discharge: 2020-02-25 | Disposition: A | Discharge status: Home / Self Care | Payer: Medicare Other | Source: Ambulatory Visit | Attending: Radiation Oncology | Admitting: Radiation Oncology

## 2020-02-25 ENCOUNTER — Other Ambulatory Visit: Payer: Self-pay

## 2020-02-25 DIAGNOSIS — Z510 Encounter for antineoplastic radiation therapy: Secondary | ICD-10-CM | POA: Diagnosis not present

## 2020-02-25 DIAGNOSIS — C50512 Malignant neoplasm of lower-outer quadrant of left female breast: Secondary | ICD-10-CM | POA: Diagnosis not present

## 2020-02-26 ENCOUNTER — Other Ambulatory Visit: Payer: Self-pay

## 2020-02-26 ENCOUNTER — Ambulatory Visit
Admission: RE | Admit: 2020-02-26 | Discharge: 2020-02-26 | Disposition: A | Discharge status: Home / Self Care | Payer: Medicare Other | Source: Ambulatory Visit | Attending: Radiation Oncology | Admitting: Radiation Oncology

## 2020-02-26 DIAGNOSIS — Z510 Encounter for antineoplastic radiation therapy: Secondary | ICD-10-CM | POA: Diagnosis not present

## 2020-02-26 DIAGNOSIS — C50512 Malignant neoplasm of lower-outer quadrant of left female breast: Secondary | ICD-10-CM | POA: Diagnosis not present

## 2020-02-27 ENCOUNTER — Ambulatory Visit
Admission: RE | Admit: 2020-02-27 | Discharge: 2020-02-27 | Disposition: A | Discharge status: Home / Self Care | Payer: Medicare Other | Source: Ambulatory Visit | Attending: Radiation Oncology | Admitting: Radiation Oncology

## 2020-02-27 ENCOUNTER — Other Ambulatory Visit: Payer: Self-pay

## 2020-02-27 DIAGNOSIS — Z510 Encounter for antineoplastic radiation therapy: Secondary | ICD-10-CM | POA: Diagnosis not present

## 2020-02-27 DIAGNOSIS — C50512 Malignant neoplasm of lower-outer quadrant of left female breast: Secondary | ICD-10-CM | POA: Diagnosis not present

## 2020-02-28 ENCOUNTER — Ambulatory Visit
Admission: RE | Admit: 2020-02-28 | Discharge: 2020-02-28 | Disposition: A | Discharge status: Home / Self Care | Payer: Medicare Other | Source: Ambulatory Visit | Attending: Radiation Oncology | Admitting: Radiation Oncology

## 2020-02-28 ENCOUNTER — Other Ambulatory Visit: Payer: Self-pay

## 2020-02-28 DIAGNOSIS — C50512 Malignant neoplasm of lower-outer quadrant of left female breast: Secondary | ICD-10-CM | POA: Diagnosis not present

## 2020-02-28 DIAGNOSIS — Z510 Encounter for antineoplastic radiation therapy: Secondary | ICD-10-CM | POA: Diagnosis not present

## 2020-03-02 ENCOUNTER — Ambulatory Visit
Admission: RE | Admit: 2020-03-02 | Discharge: 2020-03-02 | Disposition: A | Discharge status: Home / Self Care | Payer: Medicare Other | Source: Ambulatory Visit | Attending: Radiation Oncology | Admitting: Radiation Oncology

## 2020-03-02 ENCOUNTER — Telehealth: Payer: Self-pay | Admitting: Adult Health

## 2020-03-02 ENCOUNTER — Other Ambulatory Visit: Payer: Self-pay

## 2020-03-02 DIAGNOSIS — C50512 Malignant neoplasm of lower-outer quadrant of left female breast: Secondary | ICD-10-CM | POA: Diagnosis not present

## 2020-03-02 DIAGNOSIS — Z510 Encounter for antineoplastic radiation therapy: Secondary | ICD-10-CM | POA: Diagnosis not present

## 2020-03-02 NOTE — Telephone Encounter (Signed)
Cancelled appt with LC per LC's handwritten instructions. Moved lab and port flush appt closer to radiation treatment to avoid a long wait for pt. Pt confirmed new arrival time on 9/8.

## 2020-03-03 ENCOUNTER — Ambulatory Visit
Admission: RE | Admit: 2020-03-03 | Discharge: 2020-03-03 | Disposition: A | Discharge status: Home / Self Care | Payer: Medicare Other | Source: Ambulatory Visit | Attending: Radiation Oncology | Admitting: Radiation Oncology

## 2020-03-03 ENCOUNTER — Other Ambulatory Visit: Payer: Self-pay

## 2020-03-03 DIAGNOSIS — C50512 Malignant neoplasm of lower-outer quadrant of left female breast: Secondary | ICD-10-CM | POA: Diagnosis not present

## 2020-03-03 DIAGNOSIS — Z510 Encounter for antineoplastic radiation therapy: Secondary | ICD-10-CM | POA: Diagnosis not present

## 2020-03-04 ENCOUNTER — Other Ambulatory Visit: Payer: Self-pay

## 2020-03-04 ENCOUNTER — Ambulatory Visit
Admission: RE | Admit: 2020-03-04 | Discharge: 2020-03-04 | Disposition: A | Discharge status: Home / Self Care | Payer: Medicare Other | Source: Ambulatory Visit | Attending: Radiation Oncology | Admitting: Radiation Oncology

## 2020-03-04 DIAGNOSIS — C50512 Malignant neoplasm of lower-outer quadrant of left female breast: Secondary | ICD-10-CM | POA: Insufficient documentation

## 2020-03-04 DIAGNOSIS — Z510 Encounter for antineoplastic radiation therapy: Secondary | ICD-10-CM | POA: Insufficient documentation

## 2020-03-04 DIAGNOSIS — Z171 Estrogen receptor negative status [ER-]: Secondary | ICD-10-CM | POA: Diagnosis not present

## 2020-03-05 ENCOUNTER — Ambulatory Visit
Admission: RE | Admit: 2020-03-05 | Discharge: 2020-03-05 | Disposition: A | Discharge status: Home / Self Care | Payer: Medicare Other | Source: Ambulatory Visit | Attending: Radiation Oncology | Admitting: Radiation Oncology

## 2020-03-05 ENCOUNTER — Other Ambulatory Visit: Payer: Self-pay

## 2020-03-05 DIAGNOSIS — C50512 Malignant neoplasm of lower-outer quadrant of left female breast: Secondary | ICD-10-CM | POA: Diagnosis not present

## 2020-03-05 DIAGNOSIS — Z171 Estrogen receptor negative status [ER-]: Secondary | ICD-10-CM | POA: Diagnosis not present

## 2020-03-05 DIAGNOSIS — Z510 Encounter for antineoplastic radiation therapy: Secondary | ICD-10-CM | POA: Diagnosis not present

## 2020-03-06 ENCOUNTER — Other Ambulatory Visit: Payer: Self-pay

## 2020-03-06 ENCOUNTER — Ambulatory Visit
Admission: RE | Admit: 2020-03-06 | Discharge: 2020-03-06 | Disposition: A | Discharge status: Home / Self Care | Payer: Medicare Other | Source: Ambulatory Visit | Attending: Radiation Oncology | Admitting: Radiation Oncology

## 2020-03-06 DIAGNOSIS — C50512 Malignant neoplasm of lower-outer quadrant of left female breast: Secondary | ICD-10-CM | POA: Diagnosis not present

## 2020-03-06 DIAGNOSIS — Z171 Estrogen receptor negative status [ER-]: Secondary | ICD-10-CM | POA: Diagnosis not present

## 2020-03-06 DIAGNOSIS — Z510 Encounter for antineoplastic radiation therapy: Secondary | ICD-10-CM | POA: Diagnosis not present

## 2020-03-10 ENCOUNTER — Ambulatory Visit
Admission: RE | Admit: 2020-03-10 | Discharge: 2020-03-10 | Disposition: A | Discharge status: Home / Self Care | Payer: Medicare Other | Source: Ambulatory Visit | Attending: Radiation Oncology | Admitting: Radiation Oncology

## 2020-03-10 ENCOUNTER — Encounter: Payer: Self-pay | Admitting: *Deleted

## 2020-03-10 ENCOUNTER — Ambulatory Visit: Payer: Medicare Other

## 2020-03-10 ENCOUNTER — Other Ambulatory Visit: Payer: Medicare Other

## 2020-03-10 ENCOUNTER — Other Ambulatory Visit: Payer: Self-pay

## 2020-03-10 DIAGNOSIS — Z171 Estrogen receptor negative status [ER-]: Secondary | ICD-10-CM | POA: Diagnosis not present

## 2020-03-10 DIAGNOSIS — Z510 Encounter for antineoplastic radiation therapy: Secondary | ICD-10-CM | POA: Diagnosis not present

## 2020-03-10 DIAGNOSIS — C50512 Malignant neoplasm of lower-outer quadrant of left female breast: Secondary | ICD-10-CM | POA: Diagnosis not present

## 2020-03-11 ENCOUNTER — Other Ambulatory Visit: Payer: Self-pay

## 2020-03-11 ENCOUNTER — Other Ambulatory Visit: Payer: Medicare Other

## 2020-03-11 ENCOUNTER — Ambulatory Visit: Payer: Medicare Other | Admitting: Adult Health

## 2020-03-11 ENCOUNTER — Ambulatory Visit
Admission: RE | Admit: 2020-03-11 | Discharge: 2020-03-11 | Disposition: A | Discharge status: Home / Self Care | Payer: Medicare Other | Source: Ambulatory Visit | Attending: Radiation Oncology | Admitting: Radiation Oncology

## 2020-03-11 ENCOUNTER — Other Ambulatory Visit: Payer: Self-pay | Admitting: Hematology and Oncology

## 2020-03-11 ENCOUNTER — Inpatient Hospital Stay: Payer: Medicare Other

## 2020-03-11 ENCOUNTER — Inpatient Hospital Stay: Payer: Medicare Other | Attending: Oncology

## 2020-03-11 VITALS — BP 143/64 | HR 78 | Temp 98.6°F | Resp 18 | Wt 183.0 lb

## 2020-03-11 DIAGNOSIS — C50512 Malignant neoplasm of lower-outer quadrant of left female breast: Secondary | ICD-10-CM

## 2020-03-11 DIAGNOSIS — Z5112 Encounter for antineoplastic immunotherapy: Secondary | ICD-10-CM | POA: Diagnosis not present

## 2020-03-11 DIAGNOSIS — Z171 Estrogen receptor negative status [ER-]: Secondary | ICD-10-CM | POA: Diagnosis not present

## 2020-03-11 DIAGNOSIS — C773 Secondary and unspecified malignant neoplasm of axilla and upper limb lymph nodes: Secondary | ICD-10-CM | POA: Insufficient documentation

## 2020-03-11 DIAGNOSIS — Z510 Encounter for antineoplastic radiation therapy: Secondary | ICD-10-CM | POA: Diagnosis not present

## 2020-03-11 LAB — CBC WITH DIFFERENTIAL/PLATELET
Abs Immature Granulocytes: 0 10*3/uL (ref 0.00–0.07)
Basophils Absolute: 0 10*3/uL (ref 0.0–0.1)
Basophils Relative: 1 %
Eosinophils Absolute: 0.2 10*3/uL (ref 0.0–0.5)
Eosinophils Relative: 6 %
HCT: 35.5 % — ABNORMAL LOW (ref 36.0–46.0)
Hemoglobin: 11.5 g/dL — ABNORMAL LOW (ref 12.0–15.0)
Immature Granulocytes: 0 %
Lymphocytes Relative: 14 %
Lymphs Abs: 0.5 10*3/uL — ABNORMAL LOW (ref 0.7–4.0)
MCH: 32.5 pg (ref 26.0–34.0)
MCHC: 32.4 g/dL (ref 30.0–36.0)
MCV: 100.3 fL — ABNORMAL HIGH (ref 80.0–100.0)
Monocytes Absolute: 0.4 10*3/uL (ref 0.1–1.0)
Monocytes Relative: 13 %
Neutro Abs: 2.2 10*3/uL (ref 1.7–7.7)
Neutrophils Relative %: 66 %
Platelets: 177 10*3/uL (ref 150–400)
RBC: 3.54 MIL/uL — ABNORMAL LOW (ref 3.87–5.11)
RDW: 13.3 % (ref 11.5–15.5)
WBC: 3.3 10*3/uL — ABNORMAL LOW (ref 4.0–10.5)
nRBC: 0 % (ref 0.0–0.2)

## 2020-03-11 LAB — COMPREHENSIVE METABOLIC PANEL
ALT: 14 U/L (ref 0–44)
AST: 19 U/L (ref 15–41)
Albumin: 3.6 g/dL (ref 3.5–5.0)
Alkaline Phosphatase: 90 U/L (ref 38–126)
Anion gap: 8 (ref 5–15)
BUN: 15 mg/dL (ref 8–23)
CO2: 25 mmol/L (ref 22–32)
Calcium: 9.3 mg/dL (ref 8.9–10.3)
Chloride: 108 mmol/L (ref 98–111)
Creatinine, Ser: 0.8 mg/dL (ref 0.44–1.00)
GFR calc Af Amer: >60 mL/min (ref >60)
GFR calc non Af Amer: >60 mL/min (ref >60)
Glucose, Bld: 98 mg/dL (ref 70–99)
Potassium: 3.9 mmol/L (ref 3.5–5.1)
Sodium: 141 mmol/L (ref 135–145)
Total Bilirubin: 0.5 mg/dL (ref 0.3–1.2)
Total Protein: 6.6 g/dL (ref 6.5–8.1)

## 2020-03-11 MED ORDER — SODIUM CHLORIDE 0.9 % IV SOLN
Freq: Once | INTRAVENOUS | Status: AC
  Filled 2020-03-11: qty 250

## 2020-03-11 MED ORDER — HEPARIN SOD (PORK) LOCK FLUSH 100 UNIT/ML IV SOLN
500.0000 [IU] | Freq: Once | INTRAVENOUS | Status: AC | PRN
  Administered 2020-03-11: 500 [IU]
  Filled 2020-03-11: qty 5

## 2020-03-11 MED ORDER — DIPHENHYDRAMINE HCL 25 MG PO CAPS
ORAL_CAPSULE | ORAL | Status: AC
  Filled 2020-03-11: qty 1

## 2020-03-11 MED ORDER — SODIUM CHLORIDE 0.9 % IV SOLN
3.6000 mg/kg | Freq: Once | INTRAVENOUS | Status: AC
  Administered 2020-03-11: 300 mg via INTRAVENOUS
  Filled 2020-03-11: qty 15

## 2020-03-11 MED ORDER — DIPHENHYDRAMINE HCL 25 MG PO CAPS
25.0000 mg | ORAL_CAPSULE | Freq: Once | ORAL | Status: AC
  Administered 2020-03-11: 25 mg via ORAL

## 2020-03-11 MED ORDER — ACETAMINOPHEN 325 MG PO TABS
ORAL_TABLET | ORAL | Status: AC
  Filled 2020-03-11: qty 2

## 2020-03-11 MED ORDER — SODIUM CHLORIDE 0.9% FLUSH
10.0000 mL | INTRAVENOUS | Status: DC | PRN
  Administered 2020-03-11: 10 mL
  Filled 2020-03-11: qty 10

## 2020-03-11 MED ORDER — ACETAMINOPHEN 325 MG PO TABS
650.0000 mg | ORAL_TABLET | Freq: Once | ORAL | Status: AC
  Administered 2020-03-11: 650 mg via ORAL

## 2020-03-11 NOTE — Patient Instructions (Signed)
[facility] Discharge Instructions for Patients Receiving Chemotherapy  Today you received the following chemotherapy agents: trastuzumab emtansine (kadcyla).  To help prevent nausea and vomiting after your treatment, we encourage you to take your nausea medication as directed.   If you develop nausea and vomiting that is not controlled by your nausea medication, call the clinic.   BELOW ARE SYMPTOMS THAT SHOULD BE REPORTED IMMEDIATELY:  *FEVER GREATER THAN 100.5 F  *CHILLS WITH OR WITHOUT FEVER  NAUSEA AND VOMITING THAT IS NOT CONTROLLED WITH YOUR NAUSEA MEDICATION  *UNUSUAL SHORTNESS OF BREATH  *UNUSUAL BRUISING OR BLEEDING  TENDERNESS IN MOUTH AND THROAT WITH OR WITHOUT PRESENCE OF ULCERS  *URINARY PROBLEMS  *BOWEL PROBLEMS  UNUSUAL RASH Items with * indicate a potential emergency and should be followed up as soon as possible.  Feel free to call the clinic should you have any questions or concerns. The clinic phone number is [phone].  Please show the CHEMO ALERT CARD at check-in to the Emergency Department and triage nurse.   

## 2020-03-12 ENCOUNTER — Ambulatory Visit
Admission: RE | Admit: 2020-03-12 | Discharge: 2020-03-12 | Disposition: A | Discharge status: Home / Self Care | Payer: Medicare Other | Source: Ambulatory Visit | Attending: Radiation Oncology | Admitting: Radiation Oncology

## 2020-03-12 ENCOUNTER — Other Ambulatory Visit: Payer: Self-pay

## 2020-03-12 DIAGNOSIS — Z171 Estrogen receptor negative status [ER-]: Secondary | ICD-10-CM | POA: Diagnosis not present

## 2020-03-12 DIAGNOSIS — Z510 Encounter for antineoplastic radiation therapy: Secondary | ICD-10-CM | POA: Diagnosis not present

## 2020-03-12 DIAGNOSIS — C50512 Malignant neoplasm of lower-outer quadrant of left female breast: Secondary | ICD-10-CM | POA: Diagnosis not present

## 2020-03-13 ENCOUNTER — Ambulatory Visit
Admission: RE | Admit: 2020-03-13 | Discharge: 2020-03-13 | Disposition: A | Discharge status: Home / Self Care | Payer: Medicare Other | Source: Ambulatory Visit | Attending: Radiation Oncology | Admitting: Radiation Oncology

## 2020-03-13 ENCOUNTER — Other Ambulatory Visit: Payer: Self-pay

## 2020-03-13 DIAGNOSIS — Z510 Encounter for antineoplastic radiation therapy: Secondary | ICD-10-CM | POA: Diagnosis not present

## 2020-03-13 DIAGNOSIS — C50512 Malignant neoplasm of lower-outer quadrant of left female breast: Secondary | ICD-10-CM | POA: Diagnosis not present

## 2020-03-13 DIAGNOSIS — Z171 Estrogen receptor negative status [ER-]: Secondary | ICD-10-CM | POA: Diagnosis not present

## 2020-03-16 ENCOUNTER — Ambulatory Visit
Admission: RE | Admit: 2020-03-16 | Discharge: 2020-03-16 | Disposition: A | Discharge status: Home / Self Care | Payer: Medicare Other | Source: Ambulatory Visit | Attending: Radiation Oncology | Admitting: Radiation Oncology

## 2020-03-16 ENCOUNTER — Ambulatory Visit: Payer: Medicare Other | Admitting: Radiation Oncology

## 2020-03-16 ENCOUNTER — Other Ambulatory Visit: Payer: Self-pay

## 2020-03-16 DIAGNOSIS — C50512 Malignant neoplasm of lower-outer quadrant of left female breast: Secondary | ICD-10-CM | POA: Diagnosis not present

## 2020-03-16 DIAGNOSIS — Z171 Estrogen receptor negative status [ER-]: Secondary | ICD-10-CM | POA: Diagnosis not present

## 2020-03-16 DIAGNOSIS — Z510 Encounter for antineoplastic radiation therapy: Secondary | ICD-10-CM | POA: Diagnosis not present

## 2020-03-16 MED ORDER — RADIAPLEXRX EX GEL: Freq: Once | CUTANEOUS | Status: AC

## 2020-03-17 ENCOUNTER — Other Ambulatory Visit: Payer: Self-pay

## 2020-03-17 ENCOUNTER — Ambulatory Visit
Admission: RE | Admit: 2020-03-17 | Discharge: 2020-03-17 | Disposition: A | Discharge status: Home / Self Care | Payer: Medicare Other | Source: Ambulatory Visit | Attending: Radiation Oncology | Admitting: Radiation Oncology

## 2020-03-17 DIAGNOSIS — Z510 Encounter for antineoplastic radiation therapy: Secondary | ICD-10-CM | POA: Diagnosis not present

## 2020-03-17 DIAGNOSIS — C50512 Malignant neoplasm of lower-outer quadrant of left female breast: Secondary | ICD-10-CM | POA: Diagnosis not present

## 2020-03-17 DIAGNOSIS — Z171 Estrogen receptor negative status [ER-]: Secondary | ICD-10-CM | POA: Diagnosis not present

## 2020-03-18 ENCOUNTER — Other Ambulatory Visit: Payer: Self-pay

## 2020-03-18 ENCOUNTER — Ambulatory Visit
Admission: RE | Admit: 2020-03-18 | Discharge: 2020-03-18 | Disposition: A | Discharge status: Home / Self Care | Payer: Medicare Other | Source: Ambulatory Visit | Attending: Radiation Oncology | Admitting: Radiation Oncology

## 2020-03-18 DIAGNOSIS — Z510 Encounter for antineoplastic radiation therapy: Secondary | ICD-10-CM | POA: Diagnosis not present

## 2020-03-18 DIAGNOSIS — C50512 Malignant neoplasm of lower-outer quadrant of left female breast: Secondary | ICD-10-CM | POA: Diagnosis not present

## 2020-03-18 DIAGNOSIS — Z171 Estrogen receptor negative status [ER-]: Secondary | ICD-10-CM | POA: Diagnosis not present

## 2020-03-19 ENCOUNTER — Ambulatory Visit
Admission: RE | Admit: 2020-03-19 | Discharge: 2020-03-19 | Disposition: A | Discharge status: Home / Self Care | Payer: Medicare Other | Source: Ambulatory Visit | Attending: Radiation Oncology | Admitting: Radiation Oncology

## 2020-03-19 ENCOUNTER — Other Ambulatory Visit: Payer: Self-pay

## 2020-03-19 DIAGNOSIS — Z171 Estrogen receptor negative status [ER-]: Secondary | ICD-10-CM | POA: Diagnosis not present

## 2020-03-19 DIAGNOSIS — C50512 Malignant neoplasm of lower-outer quadrant of left female breast: Secondary | ICD-10-CM | POA: Diagnosis not present

## 2020-03-19 DIAGNOSIS — Z510 Encounter for antineoplastic radiation therapy: Secondary | ICD-10-CM | POA: Diagnosis not present

## 2020-03-20 ENCOUNTER — Other Ambulatory Visit: Payer: Self-pay

## 2020-03-20 ENCOUNTER — Ambulatory Visit
Admission: RE | Admit: 2020-03-20 | Discharge: 2020-03-20 | Disposition: A | Discharge status: Home / Self Care | Payer: Medicare Other | Source: Ambulatory Visit | Attending: Radiation Oncology | Admitting: Radiation Oncology

## 2020-03-20 DIAGNOSIS — C50512 Malignant neoplasm of lower-outer quadrant of left female breast: Secondary | ICD-10-CM | POA: Diagnosis not present

## 2020-03-20 DIAGNOSIS — Z510 Encounter for antineoplastic radiation therapy: Secondary | ICD-10-CM | POA: Diagnosis not present

## 2020-03-20 DIAGNOSIS — Z171 Estrogen receptor negative status [ER-]: Secondary | ICD-10-CM | POA: Diagnosis not present

## 2020-03-23 ENCOUNTER — Other Ambulatory Visit: Payer: Self-pay

## 2020-03-23 ENCOUNTER — Ambulatory Visit
Admission: RE | Admit: 2020-03-23 | Discharge: 2020-03-23 | Disposition: A | Discharge status: Home / Self Care | Payer: Medicare Other | Source: Ambulatory Visit | Attending: Radiation Oncology | Admitting: Radiation Oncology

## 2020-03-23 DIAGNOSIS — C50512 Malignant neoplasm of lower-outer quadrant of left female breast: Secondary | ICD-10-CM | POA: Diagnosis not present

## 2020-03-23 DIAGNOSIS — Z171 Estrogen receptor negative status [ER-]: Secondary | ICD-10-CM | POA: Diagnosis not present

## 2020-03-23 DIAGNOSIS — Z510 Encounter for antineoplastic radiation therapy: Secondary | ICD-10-CM | POA: Diagnosis not present

## 2020-03-24 ENCOUNTER — Ambulatory Visit
Admission: RE | Admit: 2020-03-24 | Discharge: 2020-03-24 | Disposition: A | Discharge status: Home / Self Care | Payer: Medicare Other | Source: Ambulatory Visit | Attending: Radiation Oncology | Admitting: Radiation Oncology

## 2020-03-24 ENCOUNTER — Other Ambulatory Visit: Payer: Self-pay

## 2020-03-24 DIAGNOSIS — Z171 Estrogen receptor negative status [ER-]: Secondary | ICD-10-CM | POA: Diagnosis not present

## 2020-03-24 DIAGNOSIS — C50512 Malignant neoplasm of lower-outer quadrant of left female breast: Secondary | ICD-10-CM | POA: Diagnosis not present

## 2020-03-24 DIAGNOSIS — Z510 Encounter for antineoplastic radiation therapy: Secondary | ICD-10-CM | POA: Diagnosis not present

## 2020-03-25 ENCOUNTER — Other Ambulatory Visit: Payer: Self-pay

## 2020-03-25 ENCOUNTER — Ambulatory Visit
Admission: RE | Admit: 2020-03-25 | Discharge: 2020-03-25 | Disposition: A | Discharge status: Home / Self Care | Payer: Medicare Other | Source: Ambulatory Visit | Attending: Radiation Oncology | Admitting: Radiation Oncology

## 2020-03-25 DIAGNOSIS — Z510 Encounter for antineoplastic radiation therapy: Secondary | ICD-10-CM | POA: Diagnosis not present

## 2020-03-25 DIAGNOSIS — C50512 Malignant neoplasm of lower-outer quadrant of left female breast: Secondary | ICD-10-CM | POA: Diagnosis not present

## 2020-03-25 DIAGNOSIS — Z171 Estrogen receptor negative status [ER-]: Secondary | ICD-10-CM | POA: Diagnosis not present

## 2020-03-26 ENCOUNTER — Ambulatory Visit: Payer: Medicare Other

## 2020-03-27 ENCOUNTER — Ambulatory Visit: Payer: Medicare Other

## 2020-03-30 ENCOUNTER — Other Ambulatory Visit: Payer: Self-pay

## 2020-03-30 ENCOUNTER — Inpatient Hospital Stay: Payer: Medicare Other

## 2020-03-30 ENCOUNTER — Ambulatory Visit
Admission: RE | Admit: 2020-03-30 | Discharge: 2020-03-30 | Disposition: A | Discharge status: Home / Self Care | Payer: Medicare Other | Source: Ambulatory Visit | Attending: Radiation Oncology | Admitting: Radiation Oncology

## 2020-03-30 ENCOUNTER — Encounter: Payer: Self-pay | Admitting: *Deleted

## 2020-03-30 ENCOUNTER — Other Ambulatory Visit: Payer: Self-pay | Admitting: Oncology

## 2020-03-30 ENCOUNTER — Inpatient Hospital Stay (HOSPITAL_BASED_OUTPATIENT_CLINIC_OR_DEPARTMENT_OTHER): Payer: Medicare Other | Admitting: Medical

## 2020-03-30 VITALS — BP 138/61 | HR 75 | Resp 18

## 2020-03-30 VITALS — BP 182/82 | HR 95 | Temp 98.1°F | Resp 17 | Ht 60.0 in | Wt 184.3 lb

## 2020-03-30 DIAGNOSIS — Z510 Encounter for antineoplastic radiation therapy: Secondary | ICD-10-CM | POA: Diagnosis not present

## 2020-03-30 DIAGNOSIS — Z171 Estrogen receptor negative status [ER-]: Secondary | ICD-10-CM

## 2020-03-30 DIAGNOSIS — C50512 Malignant neoplasm of lower-outer quadrant of left female breast: Secondary | ICD-10-CM | POA: Diagnosis not present

## 2020-03-30 DIAGNOSIS — Z5112 Encounter for antineoplastic immunotherapy: Secondary | ICD-10-CM | POA: Diagnosis not present

## 2020-03-30 DIAGNOSIS — C773 Secondary and unspecified malignant neoplasm of axilla and upper limb lymph nodes: Secondary | ICD-10-CM | POA: Diagnosis not present

## 2020-03-30 LAB — CBC WITH DIFFERENTIAL/PLATELET
Abs Immature Granulocytes: 0.02 10*3/uL (ref 0.00–0.07)
Basophils Absolute: 0 10*3/uL (ref 0.0–0.1)
Basophils Relative: 0 %
Eosinophils Absolute: 0.3 10*3/uL (ref 0.0–0.5)
Eosinophils Relative: 7 %
HCT: 33 % — ABNORMAL LOW (ref 36.0–46.0)
Hemoglobin: 10.6 g/dL — ABNORMAL LOW (ref 12.0–15.0)
Immature Granulocytes: 0 %
Lymphocytes Relative: 5 %
Lymphs Abs: 0.3 10*3/uL — ABNORMAL LOW (ref 0.7–4.0)
MCH: 31.2 pg (ref 26.0–34.0)
MCHC: 32.1 g/dL (ref 30.0–36.0)
MCV: 97.1 fL (ref 80.0–100.0)
Monocytes Absolute: 0.6 10*3/uL (ref 0.1–1.0)
Monocytes Relative: 11 %
Neutro Abs: 3.8 10*3/uL (ref 1.7–7.7)
Neutrophils Relative %: 77 %
Platelets: 178 10*3/uL (ref 150–400)
RBC: 3.4 MIL/uL — ABNORMAL LOW (ref 3.87–5.11)
RDW: 13.8 % (ref 11.5–15.5)
WBC: 5 10*3/uL (ref 4.0–10.5)
nRBC: 0 % (ref 0.0–0.2)

## 2020-03-30 LAB — COMPREHENSIVE METABOLIC PANEL
ALT: 12 U/L (ref 0–44)
AST: 18 U/L (ref 15–41)
Albumin: 3.2 g/dL — ABNORMAL LOW (ref 3.5–5.0)
Alkaline Phosphatase: 97 U/L (ref 38–126)
Anion gap: 6 (ref 5–15)
BUN: 18 mg/dL (ref 8–23)
CO2: 28 mmol/L (ref 22–32)
Calcium: 9.2 mg/dL (ref 8.9–10.3)
Chloride: 109 mmol/L (ref 98–111)
Creatinine, Ser: 0.79 mg/dL (ref 0.44–1.00)
GFR calc Af Amer: >60 mL/min (ref >60)
GFR calc non Af Amer: >60 mL/min (ref >60)
Glucose, Bld: 120 mg/dL — ABNORMAL HIGH (ref 70–99)
Potassium: 3.7 mmol/L (ref 3.5–5.1)
Sodium: 143 mmol/L (ref 135–145)
Total Bilirubin: 0.4 mg/dL (ref 0.3–1.2)
Total Protein: 6.6 g/dL (ref 6.5–8.1)

## 2020-03-30 MED ORDER — SODIUM CHLORIDE 0.9% FLUSH
10.0000 mL | INTRAVENOUS | Status: DC | PRN
  Administered 2020-03-30: 10 mL
  Filled 2020-03-30: qty 10

## 2020-03-30 MED ORDER — DIPHENHYDRAMINE HCL 25 MG PO CAPS
25.0000 mg | ORAL_CAPSULE | Freq: Once | ORAL | Status: AC
  Administered 2020-03-30: 25 mg via ORAL

## 2020-03-30 MED ORDER — ACETAMINOPHEN 325 MG PO TABS
ORAL_TABLET | ORAL | Status: AC
  Filled 2020-03-30: qty 2

## 2020-03-30 MED ORDER — ACETAMINOPHEN 325 MG PO TABS
650.0000 mg | ORAL_TABLET | Freq: Once | ORAL | Status: AC
  Administered 2020-03-30: 650 mg via ORAL

## 2020-03-30 MED ORDER — DIPHENHYDRAMINE HCL 25 MG PO CAPS
ORAL_CAPSULE | ORAL | Status: AC
  Filled 2020-03-30: qty 1

## 2020-03-30 MED ORDER — SODIUM CHLORIDE 0.9 % IV SOLN
3.6000 mg/kg | Freq: Once | INTRAVENOUS | Status: AC
  Administered 2020-03-30: 300 mg via INTRAVENOUS
  Filled 2020-03-30: qty 15

## 2020-03-30 MED ORDER — HEPARIN SOD (PORK) LOCK FLUSH 100 UNIT/ML IV SOLN
500.0000 [IU] | Freq: Once | INTRAVENOUS | Status: AC | PRN
  Administered 2020-03-30: 500 [IU]
  Filled 2020-03-30: qty 5

## 2020-03-30 MED ORDER — SODIUM CHLORIDE 0.9 % IV SOLN
Freq: Once | INTRAVENOUS | Status: AC
  Filled 2020-03-30: qty 250

## 2020-03-30 NOTE — Progress Notes (Signed)
[facility] Progress Note   Julie Thompson [id number] 1945-10-06 74 y.o.  Julie Thompson is managed by Dr. Lurline Del  Actively treated with chemotherapy/immunotherapy/hormonal therapy: yes  Current therapy: Kadcyla with concurrent radiation therapy  Last treated: 03/11/2020 (cycle #2, day #1)  Next scheduled appointment with provider: 04/21/2020  Assessment: Plan:    Malignant neoplasm of lower-outer quadrant of left breast of female, estrogen receptor negative ([place])   ER negative malignant neoplasm of the left breast: Julie Thompson presents to the clinic today for consideration of cycle 3, day 1 of Kadcyla.  She continues on radiation therapy and has completed 28/30 fractions thus far.  She is scheduled to be seen in follow-up by Dr. Jana Hakim on 04/21/2020.  Please see After Visit Summary for patient specific instructions.  Future Appointments  Date Time Provider [place]  04/21/2020  9:45 AM [facility] LAB [facility] None  04/21/2020 10:00 AM [facility] [place] FLUSH [facility] None  04/21/2020 11:00 AM [facility] INFUSION [facility] None  05/06/2020 11:00 AM Eppie Gibson, MD [facility] None  05/12/2020  7:45 AM [facility] LAB [facility] None  05/12/2020  8:00 AM [facility] [place] FLUSH [facility] None  05/12/2020  8:30 AM Magrinat, Virgie Dad, MD [facility] None  05/12/2020  9:30 AM [facility] INFUSION [facility] None  05/14/2020 10:00 AM [facility] ECHO OP 1 [facility]  05/14/2020 11:00 AM Bensimhon, Shaune Pascal, MD [facility] None  06/02/2020 12:45 PM [facility] LAB [facility] None  06/02/2020  1:00 PM [facility] [place] FLUSH [facility] None  06/02/2020  1:30 PM Magrinat, Virgie Dad, MD [facility] None  06/02/2020  2:30 PM [facility] INFUSION [facility] None  06/23/2020 11:15 AM [facility] LAB [facility] None  06/23/2020 11:30 AM [facility] [place] FLUSH [facility] None  06/23/2020 12:00 PM Magrinat, Virgie Dad, MD [facility] None  06/23/2020  1:15 PM  [facility] INFUSION [facility] None    No orders of the defined types were placed in this encounter.      Subjective:   Patient ID:  Julie Thompson is a 74 y.o. (DOB [birth year]) female.  Chief Complaint: No chief complaint on file.   HPI Julie Thompson  is a 74 y.o. 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  She is managed by Dr. Jana Hakim and presents to the clinic today for consideration of cycle 3, day 1 of Kadcyla.  She continues on radiation therapy and has completed 28/30 fractions thus far.  She has some hyperpigmentation and mild skin peeling in her left chest wall.  She otherwise is doing well with no issues of concern.  She is looking forward to completing radiation later this week.   Medications: I have reviewed the patient's current medications.  Allergies:  Allergies  Allergen Reactions  . Penicillins Hives    Whelps   . Sulfa Antibiotics Rash    Past Medical History:  Diagnosis Date  . Arthritis 2012  . Asthma    no problems now  . Family history of basal cell carcinoma   . Family history of breast cancer   . Family history of colon cancer   . Family history of kidney cancer   . Family history of leukemia   . Family history of peritoneal cancer   . GERD (gastroesophageal reflux disease)   . H/O hiatal hernia   . Hyperlipidemia   . Hypothyroidism   . IBS (irritable bowel syndrome)   . lt breast ca dx'd 07/10/19    Past Surgical History:  Procedure Laterality  Date  . ABDOMINAL HYSTERECTOMY     age 67  . BREAST LUMPECTOMY WITH RADIOACTIVE SEED AND AXILLARY LYMPH NODE DISSECTION Left 01/16/2020   Procedure: LEFT BREAST LUMPECTOMY WITH RADIOACTIVE SEED AND TARGETED AXILLARY LYMPH NODE DISSECTION;  Surgeon: Donnie Mesa, MD;  Location: [place];  Service: General;  Laterality: Left;  LMA  . BREAST SURGERY    . DILATION AND CURETTAGE OF UTERUS     2 miscarriages age 25 & 89  . FLEXIBLE SIGMOIDOSCOPY N/A  10/22/2013   Procedure: FLEXIBLE SIGMOIDOSCOPY;  Surgeon: Garlan Fair, MD;  Location: [facility];  Service: Endoscopy;  Laterality: N/A;  . KNEE ARTHROSCOPY Left   . PORT-A-CATH REMOVAL N/A 01/16/2020   Procedure: REMOVAL PORT-A-CATH;  Surgeon: Donnie Mesa, MD;  Location: [place];  Service: General;  Laterality: N/A;  . PORTACATH PLACEMENT Right 07/29/2019   Procedure: INSERTION PORT-A-CATH WITH ULTRASOUND;  Surgeon: Donnie Mesa, MD;  Location: [place];  Service: General;  Laterality: Right;  . PORTACATH PLACEMENT N/A 02/13/2020   Procedure: INSERTION PORT-A-CATH WITH ULTRASOUND GUIDANCE;  Surgeon: Donnie Mesa, MD;  Location: [place];  Service: General;  Laterality: N/A;  . TONSILLECTOMY     age 65    Family History  Problem Relation Age of Onset  . Breast cancer Maternal Aunt        dx. in her 12s  . Cancer Mother 46       peritoneal cancer, death certificate lists ovarian cancer  . Colon cancer Paternal Uncle        dx. late 20s or early 105s  . Basal cell carcinoma Sister 77       a second diagnosed in her 64s  . Kidney cancer Maternal Grandmother 61  . Diabetes Paternal Grandmother   . Heart Problems Paternal Grandmother   . Leukemia Paternal Uncle        dx. early 37s    Social History   Socioeconomic History  . Marital status: Married    Spouse name: Not on file  . Number of children: Not on file  . Years of education: Not on file  . Highest education level: Not on file  Occupational History  . Not on file  Tobacco Use  . Smoking status: Never Smoker  . Smokeless tobacco: Never Used  Vaping Use  . Vaping Use: Never used  Substance and Sexual Activity  . Alcohol use: Never  . Drug use: No  . Sexual activity: Not Currently    Birth control/protection: Surgical  Other Topics Concern  . Not on file  Social History Narrative  . Not on file   Social Determinants of Health   Financial Resource Strain:   . Difficulty  of Paying Living Expenses: Not on file  Food Insecurity:   . Worried About Charity fundraiser in the Last Year: Not on file  . Ran Out of Food in the Last Year: Not on file  Transportation Needs:   . Lack of Transportation (Medical): Not on file  . Lack of Transportation (Non-Medical): Not on file  Physical Activity:   . Days of Exercise per Week: Not on file  . Minutes of Exercise per Session: Not on file  Stress:   . Feeling of Stress : Not on file  Social Connections:   . Frequency of Communication with Friends and Family: Not on file  . Frequency of Social Gatherings with Friends and Family: Not on file  . Attends Religious Services: Not  on file  . Active Member of Clubs or Organizations: Not on file  . Attends Archivist Meetings: Not on file  . Marital Status: Not on file  Intimate Partner Violence:   . Fear of Current or Ex-Partner: Not on file  . Emotionally Abused: Not on file  . Physically Abused: Not on file  . Sexually Abused: Not on file    Past Medical History, Surgical history, Social history, and Family history were reviewed and updated as appropriate.   Please see review of systems for further details on the patient's review from today.   Review of Systems:  Review of Systems  Constitutional: Negative for chills, diaphoresis and fever.  HENT: Negative for trouble swallowing and voice change.   Respiratory: Negative for cough, chest tightness, shortness of breath and wheezing.   Cardiovascular: Negative for chest pain and palpitations.  Gastrointestinal: Negative for abdominal pain, constipation, diarrhea, nausea and vomiting.  Musculoskeletal: Negative for back pain and myalgias.  Skin: Positive for color change.  Neurological: Negative for dizziness, light-headedness and headaches.    Objective:   Physical Exam:  BP (!) 182/82 (BP Location: Left Arm, Patient Position: Sitting)   Pulse 95   Temp 98.1 F (36.7 C) (Tympanic)   Resp 17   Ht  5' (1.524 m)   Wt 184 lb 4.8 oz (83.6 kg)   SpO2 97%   BMI 35.99 kg/m  ECOG: 0  Physical Exam Constitutional:      General: She is not in acute distress.    Appearance: She is not diaphoretic.  HENT:     Head: Normocephalic and atraumatic.  Eyes:     General: No scleral icterus.       Right eye: No discharge.        Left eye: No discharge.     Conjunctiva/sclera: Conjunctivae normal.  Cardiovascular:     Rate and Rhythm: Normal rate and regular rhythm.     Heart sounds: Normal heart sounds. No murmur heard.  No friction rub. No gallop.   Pulmonary:     Effort: Pulmonary effort is normal. No respiratory distress.     Breath sounds: Normal breath sounds. No wheezing or rales.  Abdominal:     General: Abdomen is flat. Bowel sounds are normal. There is no distension.     Palpations: Abdomen is soft.     Tenderness: There is no abdominal tenderness. There is no guarding.  Musculoskeletal:     Right lower leg: No edema.     Left lower leg: No edema.  Skin:    General: Skin is warm and dry.     Findings: No erythema or rash.  Neurological:     Mental Status: She is alert.     Coordination: Coordination normal.     Gait: Gait normal.  Psychiatric:        Mood and Affect: Mood normal.        Behavior: Behavior normal.        Thought Content: Thought content normal.        Judgment: Judgment normal.     Lab Review:     Component Value Date/Time   NA 143 03/30/2020 0910   K 3.7 03/30/2020 0910   CL 109 03/30/2020 0910   CO2 28 03/30/2020 0910   GLUCOSE 120 (H) 03/30/2020 0910   BUN 18 03/30/2020 0910   CREATININE 0.79 03/30/2020 0910   CREATININE 0.94 07/17/2019 0823   CALCIUM 9.2 03/30/2020 0910   PROT 6.6  03/30/2020 0910   ALBUMIN 3.2 (L) 03/30/2020 0910   AST 18 03/30/2020 0910   AST 21 07/17/2019 0823   ALT 12 03/30/2020 0910   ALT 18 07/17/2019 0823   ALKPHOS 97 03/30/2020 0910   BILITOT 0.4 03/30/2020 0910   BILITOT 0.4 07/17/2019 0823   GFRNONAA >60  03/30/2020 0910   GFRNONAA >60 07/17/2019 0823   GFRAA >60 03/30/2020 0910   GFRAA >60 07/17/2019 0823       Component Value Date/Time   WBC 5.0 03/30/2020 0910   RBC 3.40 (L) 03/30/2020 0910   HGB 10.6 (L) 03/30/2020 0910   HGB 12.9 07/17/2019 0823   HCT 33.0 (L) 03/30/2020 0910   PLT 178 03/30/2020 0910   PLT 228 07/17/2019 0823   MCV 97.1 03/30/2020 0910   MCH 31.2 03/30/2020 0910   MCHC 32.1 03/30/2020 0910   RDW 13.8 03/30/2020 0910   LYMPHSABS 0.3 (L) 03/30/2020 0910   MONOABS 0.6 03/30/2020 0910   EOSABS 0.3 03/30/2020 0910   BASOSABS 0.0 03/30/2020 0910   -------------------------------  Imaging from last 24 hours (if applicable):  Radiology interpretation: No results found.    OK to treat.  Sandi Mealy, MHS, PA-C

## 2020-03-30 NOTE — Patient Instructions (Signed)
[facility] Discharge Instructions for Patients Receiving Chemotherapy  Today you received the following chemotherapy agents: trastuzumab emtansine (kadcyla).  To help prevent nausea and vomiting after your treatment, we encourage you to take your nausea medication as directed.   If you develop nausea and vomiting that is not controlled by your nausea medication, call the clinic.   BELOW ARE SYMPTOMS THAT SHOULD BE REPORTED IMMEDIATELY:  *FEVER GREATER THAN 100.5 F  *CHILLS WITH OR WITHOUT FEVER  NAUSEA AND VOMITING THAT IS NOT CONTROLLED WITH YOUR NAUSEA MEDICATION  *UNUSUAL SHORTNESS OF BREATH  *UNUSUAL BRUISING OR BLEEDING  TENDERNESS IN MOUTH AND THROAT WITH OR WITHOUT PRESENCE OF ULCERS  *URINARY PROBLEMS  *BOWEL PROBLEMS  UNUSUAL RASH Items with * indicate a potential emergency and should be followed up as soon as possible.  Feel free to call the clinic should you have any questions or concerns. The clinic phone number is [phone].  Please show the CHEMO ALERT CARD at check-in to the Emergency Department and triage nurse.   

## 2020-03-30 NOTE — Patient Instructions (Signed)

## 2020-03-31 ENCOUNTER — Ambulatory Visit
Admission: RE | Admit: 2020-03-31 | Discharge: 2020-03-31 | Disposition: A | Discharge status: Home / Self Care | Payer: Medicare Other | Source: Ambulatory Visit | Attending: Radiation Oncology | Admitting: Radiation Oncology

## 2020-03-31 ENCOUNTER — Ambulatory Visit: Payer: Medicare Other

## 2020-03-31 ENCOUNTER — Other Ambulatory Visit: Payer: Medicare Other

## 2020-03-31 DIAGNOSIS — Z510 Encounter for antineoplastic radiation therapy: Secondary | ICD-10-CM | POA: Diagnosis not present

## 2020-03-31 DIAGNOSIS — Z171 Estrogen receptor negative status [ER-]: Secondary | ICD-10-CM | POA: Diagnosis not present

## 2020-03-31 DIAGNOSIS — C50512 Malignant neoplasm of lower-outer quadrant of left female breast: Secondary | ICD-10-CM | POA: Diagnosis not present

## 2020-04-01 ENCOUNTER — Ambulatory Visit
Admission: RE | Admit: 2020-04-01 | Discharge: 2020-04-01 | Disposition: A | Discharge status: Home / Self Care | Payer: Medicare Other | Source: Ambulatory Visit | Attending: Radiation Oncology | Admitting: Radiation Oncology

## 2020-04-01 ENCOUNTER — Encounter: Payer: Self-pay | Admitting: Radiation Oncology

## 2020-04-01 DIAGNOSIS — Z510 Encounter for antineoplastic radiation therapy: Secondary | ICD-10-CM | POA: Diagnosis not present

## 2020-04-01 DIAGNOSIS — Z171 Estrogen receptor negative status [ER-]: Secondary | ICD-10-CM | POA: Diagnosis not present

## 2020-04-01 DIAGNOSIS — C50512 Malignant neoplasm of lower-outer quadrant of left female breast: Secondary | ICD-10-CM | POA: Diagnosis not present

## 2020-04-13 DIAGNOSIS — I10 Essential (primary) hypertension: Secondary | ICD-10-CM | POA: Diagnosis not present

## 2020-04-13 DIAGNOSIS — M170 Bilateral primary osteoarthritis of knee: Secondary | ICD-10-CM | POA: Diagnosis not present

## 2020-04-13 DIAGNOSIS — E7800 Pure hypercholesterolemia, unspecified: Secondary | ICD-10-CM | POA: Diagnosis not present

## 2020-04-13 DIAGNOSIS — E039 Hypothyroidism, unspecified: Secondary | ICD-10-CM | POA: Diagnosis not present

## 2020-04-21 ENCOUNTER — Encounter: Payer: Self-pay | Admitting: *Deleted

## 2020-04-21 ENCOUNTER — Inpatient Hospital Stay: Payer: Medicare Other

## 2020-04-21 ENCOUNTER — Inpatient Hospital Stay: Payer: Medicare Other | Attending: Oncology

## 2020-04-21 ENCOUNTER — Other Ambulatory Visit: Payer: Self-pay

## 2020-04-21 VITALS — BP 138/62 | HR 98 | Temp 98.2°F | Resp 20 | Wt 180.2 lb

## 2020-04-21 DIAGNOSIS — Z171 Estrogen receptor negative status [ER-]: Secondary | ICD-10-CM

## 2020-04-21 DIAGNOSIS — C50512 Malignant neoplasm of lower-outer quadrant of left female breast: Secondary | ICD-10-CM

## 2020-04-21 DIAGNOSIS — C773 Secondary and unspecified malignant neoplasm of axilla and upper limb lymph nodes: Secondary | ICD-10-CM | POA: Insufficient documentation

## 2020-04-21 DIAGNOSIS — Z95828 Presence of other vascular implants and grafts: Secondary | ICD-10-CM

## 2020-04-21 DIAGNOSIS — Z5112 Encounter for antineoplastic immunotherapy: Secondary | ICD-10-CM | POA: Diagnosis not present

## 2020-04-21 LAB — CBC WITH DIFFERENTIAL/PLATELET
Abs Immature Granulocytes: 0.02 10*3/uL (ref 0.00–0.07)
Basophils Absolute: 0 10*3/uL (ref 0.0–0.1)
Basophils Relative: 1 %
Eosinophils Absolute: 0.4 10*3/uL (ref 0.0–0.5)
Eosinophils Relative: 7 %
HCT: 32.6 % — ABNORMAL LOW (ref 36.0–46.0)
Hemoglobin: 10.1 g/dL — ABNORMAL LOW (ref 12.0–15.0)
Immature Granulocytes: 0 %
Lymphocytes Relative: 8 %
Lymphs Abs: 0.5 10*3/uL — ABNORMAL LOW (ref 0.7–4.0)
MCH: 29.8 pg (ref 26.0–34.0)
MCHC: 31 g/dL (ref 30.0–36.0)
MCV: 96.2 fL (ref 80.0–100.0)
Monocytes Absolute: 0.6 10*3/uL (ref 0.1–1.0)
Monocytes Relative: 9 %
Neutro Abs: 4.5 10*3/uL (ref 1.7–7.7)
Neutrophils Relative %: 75 %
Platelets: 187 10*3/uL (ref 150–400)
RBC: 3.39 MIL/uL — ABNORMAL LOW (ref 3.87–5.11)
RDW: 15.1 % (ref 11.5–15.5)
WBC: 6.1 10*3/uL (ref 4.0–10.5)
nRBC: 0 % (ref 0.0–0.2)

## 2020-04-21 LAB — COMPREHENSIVE METABOLIC PANEL
ALT: 9 U/L (ref 0–44)
AST: 17 U/L (ref 15–41)
Albumin: 2.9 g/dL — ABNORMAL LOW (ref 3.5–5.0)
Alkaline Phosphatase: 110 U/L (ref 38–126)
Anion gap: 5 (ref 5–15)
BUN: 12 mg/dL (ref 8–23)
CO2: 29 mmol/L (ref 22–32)
Calcium: 9.2 mg/dL (ref 8.9–10.3)
Chloride: 108 mmol/L (ref 98–111)
Creatinine, Ser: 0.73 mg/dL (ref 0.44–1.00)
GFR, Estimated: >60 mL/min (ref >60)
Glucose, Bld: 106 mg/dL — ABNORMAL HIGH (ref 70–99)
Potassium: 3.5 mmol/L (ref 3.5–5.1)
Sodium: 142 mmol/L (ref 135–145)
Total Bilirubin: 0.3 mg/dL (ref 0.3–1.2)
Total Protein: 6.4 g/dL — ABNORMAL LOW (ref 6.5–8.1)

## 2020-04-21 MED ORDER — ACETAMINOPHEN 325 MG PO TABS
ORAL_TABLET | ORAL | Status: AC
  Filled 2020-04-21: qty 2

## 2020-04-21 MED ORDER — SODIUM CHLORIDE 0.9% FLUSH
10.0000 mL | Freq: Once | INTRAVENOUS | Status: AC
  Administered 2020-04-21: 10 mL
  Filled 2020-04-21: qty 10

## 2020-04-21 MED ORDER — SODIUM CHLORIDE 0.9% FLUSH
10.0000 mL | INTRAVENOUS | Status: DC | PRN
  Administered 2020-04-21: 10 mL
  Filled 2020-04-21: qty 10

## 2020-04-21 MED ORDER — ACETAMINOPHEN 325 MG PO TABS
650.0000 mg | ORAL_TABLET | Freq: Once | ORAL | Status: AC
  Administered 2020-04-21: 650 mg via ORAL

## 2020-04-21 MED ORDER — SODIUM CHLORIDE 0.9 % IV SOLN
3.6000 mg/kg | Freq: Once | INTRAVENOUS | Status: AC
  Administered 2020-04-21: 300 mg via INTRAVENOUS
  Filled 2020-04-21: qty 15

## 2020-04-21 MED ORDER — DIPHENHYDRAMINE HCL 25 MG PO CAPS
25.0000 mg | ORAL_CAPSULE | Freq: Once | ORAL | Status: AC
  Administered 2020-04-21: 25 mg via ORAL

## 2020-04-21 MED ORDER — DIPHENHYDRAMINE HCL 25 MG PO CAPS
ORAL_CAPSULE | ORAL | Status: AC
  Filled 2020-04-21: qty 1

## 2020-04-21 MED ORDER — HEPARIN SOD (PORK) LOCK FLUSH 100 UNIT/ML IV SOLN
500.0000 [IU] | Freq: Once | INTRAVENOUS | Status: AC | PRN
  Administered 2020-04-21: 500 [IU]
  Filled 2020-04-21: qty 5

## 2020-04-21 MED ORDER — SODIUM CHLORIDE 0.9 % IV SOLN
Freq: Once | INTRAVENOUS | Status: AC
  Filled 2020-04-21: qty 250

## 2020-04-21 NOTE — Patient Instructions (Signed)
[facility] Discharge Instructions for Patients Receiving Chemotherapy  Today you received the following chemotherapy agents: trastuzumab emtansine (kadcyla).  To help prevent nausea and vomiting after your treatment, we encourage you to take your nausea medication as directed.   If you develop nausea and vomiting that is not controlled by your nausea medication, call the clinic.   BELOW ARE SYMPTOMS THAT SHOULD BE REPORTED IMMEDIATELY:  *FEVER GREATER THAN 100.5 F  *CHILLS WITH OR WITHOUT FEVER  NAUSEA AND VOMITING THAT IS NOT CONTROLLED WITH YOUR NAUSEA MEDICATION  *UNUSUAL SHORTNESS OF BREATH  *UNUSUAL BRUISING OR BLEEDING  TENDERNESS IN MOUTH AND THROAT WITH OR WITHOUT PRESENCE OF ULCERS  *URINARY PROBLEMS  *BOWEL PROBLEMS  UNUSUAL RASH Items with * indicate a potential emergency and should be followed up as soon as possible.  Feel free to call the clinic should you have any questions or concerns. The clinic phone number is [phone].  Please show the [place] at check-in to the Emergency Department and triage nurse.

## 2020-04-30 DIAGNOSIS — H524 Presbyopia: Secondary | ICD-10-CM | POA: Diagnosis not present

## 2020-05-04 ENCOUNTER — Other Ambulatory Visit: Payer: Self-pay | Admitting: Internal Medicine

## 2020-05-04 DIAGNOSIS — E7800 Pure hypercholesterolemia, unspecified: Secondary | ICD-10-CM | POA: Diagnosis not present

## 2020-05-04 DIAGNOSIS — K2100 Gastro-esophageal reflux disease with esophagitis, without bleeding: Secondary | ICD-10-CM | POA: Diagnosis not present

## 2020-05-04 DIAGNOSIS — Z1389 Encounter for screening for other disorder: Secondary | ICD-10-CM | POA: Diagnosis not present

## 2020-05-04 DIAGNOSIS — Z0000 Encounter for general adult medical examination without abnormal findings: Secondary | ICD-10-CM | POA: Diagnosis not present

## 2020-05-04 DIAGNOSIS — E039 Hypothyroidism, unspecified: Secondary | ICD-10-CM | POA: Diagnosis not present

## 2020-05-04 DIAGNOSIS — M85859 Other specified disorders of bone density and structure, unspecified thigh: Secondary | ICD-10-CM

## 2020-05-04 DIAGNOSIS — Z23 Encounter for immunization: Secondary | ICD-10-CM | POA: Diagnosis not present

## 2020-05-05 ENCOUNTER — Telehealth: Payer: Self-pay

## 2020-05-05 NOTE — Telephone Encounter (Signed)
I called the patient today about her upcoming follow-up appointment in radiation oncology.   Given the state of the COVID-19 pandemic, concerning case numbers in our community, and guidance from [facility], I offered a phone assessment with the patient to determine if coming to the clinic was necessary. She accepted.  I let the patient know that I had spoken with Dr. Isidore Moos, and she wanted them to know the importance of washing their hands for at least 20 seconds at a time, especially after going out in public, and before they eat.  Limit going out in public whenever possible. Do not touch your face, unless your hands are clean, such as when bathing. Get plenty of rest, eat well, and stay hydrated. Patient verbalized understanding and agreement.  The patient denies any symptomatic concerns. She reports her fatigue has greatly improved and she almost feels back to her baseline. She denies any swelling to the breast or issues with range of motion to her left arm. She does report occasional sharp pains near her lumpectomy incision, but she feels these are lessening in frequency. She reports good healing of her skin in the radiation fields. Skin is intact and almost back to baseline coloring. I recommended that she continue skin care by applying oil or lotion with vitamin E to the skin in the radiation fields, BID, for 2 more months.    Continue follow-up with medical oncology - follow-up is scheduled on 05/12/2020 with Dr. Lurline Del before her Kadcyla infusion.  I explained that yearly mammograms are important for patients with intact breast tissue, and physical exams are important after mastectomy for patients that cannot undergo mammography. Patient verbalized understanding and agreement.  Wished patient a happy early birthday, and encouraged her to call if she had further questions or concerns about her healing. Otherwise, she will follow-up PRN in radiation oncology. Patient is pleased  with this plan, and we will cancel her upcoming follow-up to reduce the risk of COVID-19 transmission.

## 2020-05-06 ENCOUNTER — Ambulatory Visit: Payer: Medicare Other | Admitting: Radiation Oncology

## 2020-05-11 NOTE — Progress Notes (Signed)
[facility]  [phone] [phone]     ID: [name] DOB: Sep 28, 1945  MR#: [id number]  YJE#:[id number]  Patient Care Team: Leeroy Cha, MD as PCP - General (Internal Medicine) Rockwell Germany, RN as Oncology Nurse Navigator Mauro Kaufmann, RN as Oncology Nurse Navigator Eppie Gibson, MD as Attending Physician (Radiation Oncology) Donnie Mesa, MD as Consulting Physician (General Surgery) Dannae Kato, Virgie Dad, MD as Consulting Physician (Oncology) Melrose Nakayama, MD as Consulting Physician (Orthopedic Surgery) Bensimhon, Shaune Pascal, MD as Consulting Physician (Cardiology) Chauncey Cruel, MD OTHER MD:  CHIEF COMPLAINT: 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   She continues on adjuvant Kadcyla/ T-DM1.  Today she will receive the fifth of 14 planned doses.  She has generally tolerated this well although while receiving treatment in the late part of her radiation she had some side effects.  Largely those have subsided  Her most recent echocardiogram from 02/12/2020 showed an ejection fraction of 60-65%.  She has a repeat echocardiogram scheduled 05/14/2020.  Since her last visit, she completed radiation therapy on 04/01/2020 under Dr. Isidore Moos.   REVIEW OF SYSTEMS: Jeena tells me she feels more like herself and has more energy.  She goes to her church and walks around the gym at least 5 times.  Her goal is 25 times.  She is also doing more housework and getting out more.  Her skin is slowly healing.  She occasionally has shooting pains particularly around the left nipple area.  She understands these are benign although unpleasant.  Sometimes at night when she gets to bed she feels like there is a ring around her ankles.  She does not have any problems with her wrists.   She also has some cramps at times.  A detailed review of systems today was otherwise stable   COVID 19 VACCINATION STATUS: Status post Pfizer x1, July 2021    HISTORY OF CURRENT ILLNESS: From the original intake note:  Ecko Beasley had routine screening mammography on 06/21/2019 showing a possible abnormality in the left breast. She underwent left diagnostic mammography with tomography and left breast ultrasonography at [facility] on 07/01/2019 showing: breast density category C; either 2 adjacent masses or 1 complex dumbbell-shaped mass containing calcifications in the left breast at 6 o'clock, the largest of which measures 2.1 cm; chest wall invasion not excluded; 1-2 mm group of calcifications just anterior to the mass(es); single abnormal lymph node in the left axilla; vascular calcifications in anterior left breast.  Accordingly on 07/09/2019 she proceeded to biopsy of the left breast areas in question. The pathology from this procedure ([id number]) showed:  1. Left Breast, 5:30, posterior  - invasive ductal carcinoma with necrosis, grade 3  - Prognostic indicators significant for: estrogen receptor, 0% negative and progesterone receptor, 0% negative. Proliferation marker Ki67 at 60%. HER2 negative by immunohistochemistry (0). 2. Left Breast, 5:30, posterior  - invasive ductal carcinoma with necrosis, grade 3  - ductal carcinoma in situ, intermediate grade 3. Left Axilla, lymph node (1)  - metastatic carcinoma involving a lymph node  And additional biopsy of the anterior vascular calcifications in the left breast was performed on 07/10/2019. Pathology [id number]) showed: fibrocystic changes with calcifications; no malignancy identified.  This was felt to be concordant.  The patient's subsequent history is as detailed below.   PAST MEDICAL  HISTORY: Past Medical History:  Diagnosis Date  . Arthritis 2012  . Asthma    no problems now  . Family history of basal cell carcinoma   .  Family history of breast cancer   . Family history of colon cancer   . Family history of kidney cancer   . Family history of leukemia   . Family history of peritoneal cancer   . GERD (gastroesophageal reflux disease)   . H/O hiatal hernia   . Hyperlipidemia   . Hypothyroidism   . IBS (irritable bowel syndrome)   . lt breast ca dx'd 07/10/19    PAST SURGICAL HISTORY: Past Surgical History:  Procedure Laterality Date  . ABDOMINAL HYSTERECTOMY     74  . BREAST LUMPECTOMY WITH RADIOACTIVE SEED AND AXILLARY LYMPH NODE DISSECTION Left 01/16/2020   Procedure: LEFT BREAST LUMPECTOMY WITH RADIOACTIVE SEED AND TARGETED AXILLARY LYMPH NODE DISSECTION;  Surgeon: Donnie Mesa, MD;  Location: [facility];  Service: General;  Laterality: Left;  LMA  . BREAST SURGERY    . DILATION AND CURETTAGE OF UTERUS     2 miscarriages 74  . FLEXIBLE SIGMOIDOSCOPY N/A 10/22/2013   Procedure: FLEXIBLE SIGMOIDOSCOPY;  Surgeon: Garlan Fair, MD;  Location: [facility];  Service: Endoscopy;  Laterality: N/A;  . KNEE ARTHROSCOPY Left   . PORT-A-CATH REMOVAL N/A 01/16/2020   Procedure: REMOVAL PORT-A-CATH;  Surgeon: Donnie Mesa, MD;  Location: [facility];  Service: General;  Laterality: N/A;  . PORTACATH PLACEMENT Right 07/29/2019   Procedure: INSERTION PORT-A-CATH WITH ULTRASOUND;  Surgeon: Donnie Mesa, MD;  Location: [facility];  Service: General;  Laterality: Right;  . PORTACATH PLACEMENT N/A 02/13/2020   Procedure: INSERTION PORT-A-CATH WITH ULTRASOUND GUIDANCE;  Surgeon: Donnie Mesa, MD;  Location: [facility];  Service: General;  Laterality: N/A;  . TONSILLECTOMY     74    FAMILY HISTORY: Family History  Problem Relation Age of Onset  . Breast cancer Maternal Aunt        dx. in her 55s  . Cancer Mother 49       peritoneal cancer, death certificate lists ovarian cancer  . Colon cancer Paternal Uncle        dx. late 24s or early 45s  . Basal cell carcinoma  Sister 55       a second diagnosed in her 78s  . Kidney cancer Maternal Grandmother 29  . Diabetes Paternal Grandmother   . Heart Problems Paternal Grandmother   . Leukemia Paternal Uncle        dx. early 41s   Patient's father is currently living at age 41 as of 2019/08/06. Patient's mother died from ovarian cancer at age 82. She was diagnosed at age 36. The patient reports breast cancer in a maternal aunt, diagnosed in her 30's. The patient also noted colon cancer in a paternal uncle and kidney cancer in her maternal grandmother.  She has 1 sister.   GYNECOLOGIC HISTORY:  No LMP recorded. Patient has had a hysterectomy. Menarche: [age] years old Age at first live birth: 74 years old [place] P 2 LMP age 78 (with hysterectomy) Contraceptive: never used HRT used for 10-12 years  Hysterectomy? Yes, age 72 BSO? 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  Husband [name] is a retired Psychologist, prison and probation services. She lives at home with husband Butch. She has two children from a prior marriage.  Son [name], age 20, works as a Games developer in [place], [place]. Son [name], age 45, works as a Programme researcher, broadcasting/film/video in [place], [place]. The patient has 4 grandchildren and 4 great-grandchildren. She attends [employer].    ADVANCED DIRECTIVES: In the absence of any documentation to the contrary, the patient's spouse is their HCPOA.    HEALTH MAINTENANCE: Social History   Tobacco Use  . Smoking status: Never Smoker  . Smokeless tobacco: Never Used  Vaping Use  . Vaping Use: Never used  Substance Use Topics  . Alcohol use: Never  . Drug use: No     Colonoscopy: 09/2013, with CT virtual 11/2013  PAP: none on file, s/p hysterectomy  Bone density: 04/2001, -0.5   Allergies  Allergen Reactions  . Penicillins Hives    [place]   . Sulfa Antibiotics Rash    Current Outpatient Medications  Medication Sig Dispense Refill  .  acetaminophen (TYLENOL) 650 MG CR tablet Take 650 mg by mouth every 8 (eight) hours as needed for pain.    Marland Kitchen aspirin EC 81 MG tablet Take 81 mg by mouth daily.    Marland Kitchen atorvastatin (LIPITOR) 20 MG tablet Take 20 mg by mouth daily.    . calcium citrate-vitamin D (CITRACAL+D) 315-200 MG-UNIT tablet Take 1 tablet by mouth 2 (two) times daily.    . Cholecalciferol (VITAMIN D) 2000 UNITS tablet Take 2,000 Units by mouth daily.    Marland Kitchen co-enzyme Q-10 30 MG capsule Take 30 mg by mouth 3 (three) times daily.     Marland Kitchen esomeprazole (NEXIUM) 20 MG capsule Take 20 mg by mouth daily at 12 noon.    . famotidine (PEPCID) 20 MG tablet Take 20 mg by mouth daily as needed for heartburn or indigestion.     Marland Kitchen FIBER PO Take 1 tablet by mouth daily.    Marland Kitchen levothyroxine (SYNTHROID) 50 MCG tablet Take 50 mcg by mouth daily before breakfast.     . lidocaine-prilocaine (EMLA) cream Apply 1 application topically as needed. 30 g 0  . loratadine (CLARITIN) 10 MG tablet Take 1 tablet (10 mg total) by mouth daily. 60 tablet 1  . Multiple Vitamin (MULTIVITAMIN WITH MINERALS) TABS tablet Take 1 tablet by mouth daily.    . nabumetone (RELAFEN) 500 MG tablet Take 500 mg by mouth 2 (two) times daily.    . Omega-3 Fatty Acids (FISH OIL) 1200 MG CAPS Take 1,200 mg by mouth daily.    Marland Kitchen spironolactone (ALDACTONE) 25 MG tablet Take 0.5 tablets (12.5 mg total) by mouth daily. 15 tablet 3   No current facility-administered medications for this visit.    OBJECTIVE: White woman In no acute distress Vitals:   05/12/20 0823  BP: (!) 127/47  Pulse: 85  Resp: 18  Temp: 97.8 F (36.6 C)  SpO2: 95%     Body mass index is 35.02 kg/m.   Wt Readings from Last 3 Encounters:  05/12/20 179 lb 4.8 oz (81.3 kg)  04/21/20 180 lb 4 oz (81.8 kg)  03/30/20 184 lb 4.8 oz (83.6 kg)  ECOG FS:1 - Symptomatic but completely ambulatory  Sclerae unicteric, EOMs intact Wearing a mask No cervical or supraclavicular adenopathy Lungs no rales or  rhonchi Heart regular rate and rhythm Abd soft, nontender, positive bowel sounds MSK no focal spinal tenderness, no upper extremity lymphedema Neuro: nonfocal, well oriented, appropriate affect Breasts: The right breast is benign per the left breast is status post surgery and radiation.  The hyperpigmentation is receding.  The nipple area appears slightly irritated.  It is pink and fairly flat.  There are no palpable masses.  Both axillae are benign.   LAB RESULTS:  CMP     Component Value Date/Time   NA 142 04/21/2020 1000   K 3.5 04/21/2020 1000   CL 108 04/21/2020 1000   CO2 29 04/21/2020 1000   GLUCOSE 106 (H) 04/21/2020 1000   BUN 12 04/21/2020 1000   CREATININE 0.73 04/21/2020 1000   CREATININE 0.94 07/17/2019 0823   CALCIUM 9.2 04/21/2020 1000   PROT 6.4 (L) 04/21/2020 1000   ALBUMIN 2.9 (L) 04/21/2020 1000   AST 17 04/21/2020 1000   AST 21 07/17/2019 0823   ALT 9 04/21/2020 1000   ALT 18 07/17/2019 0823   ALKPHOS 110 04/21/2020 1000   BILITOT 0.3 04/21/2020 1000   BILITOT 0.4 07/17/2019 0823   GFRNONAA >60 04/21/2020 1000   GFRNONAA >60 07/17/2019 0823   GFRAA >60 03/30/2020 0910   GFRAA >60 07/17/2019 0823    No results found for: TOTALPROTELP, ALBUMINELP, A1GS, A2GS, BETS, BETA2SER, GAMS, MSPIKE, SPEI  Lab Results  Component Value Date   WBC 4.7 05/12/2020   NEUTROABS 3.1 05/12/2020   HGB 10.7 (L) 05/12/2020   HCT 34.1 (L) 05/12/2020   MCV 95.3 05/12/2020   PLT 180 05/12/2020    No results found for: LABCA2  No components found for: [id number]  No results for input(s): INR in the last 168 hours.  No results found for: LABCA2  No results found for: LOV564  No results found for: PPI951  No results found for: OAC166  No results found for: CA2729  No components found for: HGQUANT  No results found for: CEA1 / No results found for: CEA1   No results found for: AFPTUMOR  No results found for: CHROMOGRNA  No results found for: KPAFRELGTCHN,  LAMBDASER, KAPLAMBRATIO (kappa/lambda light chains)  No results found for: HGBA, HGBA2QUANT, HGBFQUANT, HGBSQUAN (Hemoglobinopathy evaluation)   No results found for: LDH  No results found for: IRON, TIBC, IRONPCTSAT (Iron and TIBC)  No results found for: FERRITIN  Urinalysis No results found for: COLORURINE, APPEARANCEUR, LABSPEC, PHURINE, GLUCOSEU, HGBUR, BILIRUBINUR, KETONESUR, PROTEINUR, UROBILINOGEN, NITRITE, LEUKOCYTESUR   STUDIES: No results found.   ELIGIBLE FOR AVAILABLE RESEARCH PROTOCOL: no  ASSESSMENT: 74 y.o. [place] woman status post left breast lower outer quadrant biopsy 07/09/2019 for a clinical T2 N1, stage IIIB invasive ductal carcinoma, grade 3, triple negative, with an MIB-1 of 60%  (a) chest CT scan and bone scan 07/31/2019 showed no evidence of metastatic disease  (b) MRI biopsy of 2 additional areas in the left breast (central, 07/10/2019, and at 8:30 o'clock on 07/26/2019) are both benign and concordant  (c) biopsy of 2 suspicious areas in the right breast on 07/26/2019 (9:00 and lower outer quadrant) were benign and concordant  (d) PET scan 11/21/2019 shows a 1.2 cm nodule in the left breast with an SUV max of 2.7, aortic calcification, but no lung liver or bone lesions, no evidence of metastases   (1) neoadjuvant chemotherapy consisting of cyclophosphamide and doxorubicin in dose dense fashion x4 started 07/30/2019, completed 09/10/2019, followed by weekly carboplatin and paclitaxel x12 started 09/24/2019  (a) cycle 6 carboplatin/paclitaxel delayed 2 weeks because of neutropenia: granix added  (b) cycle 12 of carboplatin/paclitaxel omitted secondary to persistent low counts  (2) left lumpectomy with targeted axillary lymph node dissection 01/16/2020 showed a ypTIc ypN1 invasive ductal carcinoma, grade 3  (a) a single axillary lymph  node was removed  (b) repeat prognostic panel on the surgical specimen showed the tumor to be estrogen and progesterone  receptor negative, MIB-110%, and HER-2 positive with a signals ratio of 2.97 and a copy number per cell 4.90.  (3) adjuvant radiation completed 04/01/2020.  (4) genetics testing 07/23/2019 through the STAT Breast cancer panel offered by [facility] found no deleterious mutations in ATM, BRCA1, BRCA2, CDH1, CHEK2, PALB2, PTEN, STK11 and TP53.  Additional testing through the Common Hereditary Cancers Panel offered by [facility] also found no deleterious mutations in IPC, ATM, AXIN2, BARD1, BMPR1A, BRCA1, BRCA2, BRIP1, CDH1, CDK4, CDKN2A (p14ARF), CDKN2A (p16INK4a), CHEK2, CTNNA1, DICER1, EPCAM (Deletion/duplication testing only), GREM1 (promoter region deletion/duplication testing only), KIT, MEN1, MLH1, MSH2, MSH3, MSH6, MUTYH, NBN, NF1, NHTL1, PALB2, PDGFRA, PMS2, POLD1, POLE, PTEN, RAD50, RAD51C, RAD51D, RNF43, SDHB, SDHC, SDHD, SMAD4, SMARCA4. STK11, TP53, TSC1, TSC2, and VHL.  The following genes were evaluated for sequence changes only: SDHA and HOXB13 c.251G>A variant only.   (a) 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   (5) adjuvant T-DM1/ Kadcyla started 02/18/2020, to be repeated Q3W x 14  (a) echo 02/12/2020 shows an ejection fraction in the 60-65% range  (b) echo 05/14/2020   PLAN: [place] is recovering nicely from her radiation treatments and her surgery.  She is tolerating the T-DM1 well and the plan will be to continue that for a total of 10 more doses including today's.  Her echocardiogram remains very favorable.  She will have a repeat in 2days.  She is doing well enough that I do not think I need to see her every treatment.  I will see her in 2 treatments and then every third treatment thereafter assuming all continues well  Total encounter time 25 minutes.Sarajane Jews C. Lillyrose Reitan, MD 05/12/20 8:46 AM Medical Oncology and Hematology [facility] [place], [place] [address] Tel. [phone]    Fax.  [phone]   I, Wilburn Mylar, am acting as scribe for Dr. Virgie Dad. Moraima Burd.  I, Lurline Del MD, have reviewed the above documentation for accuracy and completeness, and I agree with the above.   *Total Encounter Time as defined by the Centers for Medicare and Medicaid Services includes, in addition to the face-to-face time of a patient visit (documented in the note above) non-face-to-face time: obtaining and reviewing outside history, ordering and reviewing medications, tests or procedures, care coordination (communications with other health care professionals or caregivers) and documentation in the medical record.

## 2020-05-12 ENCOUNTER — Other Ambulatory Visit: Payer: Self-pay

## 2020-05-12 ENCOUNTER — Inpatient Hospital Stay: Payer: Medicare Other

## 2020-05-12 ENCOUNTER — Inpatient Hospital Stay (HOSPITAL_BASED_OUTPATIENT_CLINIC_OR_DEPARTMENT_OTHER): Payer: Medicare Other | Admitting: Oncology

## 2020-05-12 ENCOUNTER — Inpatient Hospital Stay: Payer: Medicare Other | Attending: Oncology

## 2020-05-12 VITALS — BP 127/47 | HR 85 | Temp 97.8°F | Resp 18 | Ht 60.0 in | Wt 179.3 lb

## 2020-05-12 DIAGNOSIS — Z800 Family history of malignant neoplasm of digestive organs: Secondary | ICD-10-CM | POA: Diagnosis not present

## 2020-05-12 DIAGNOSIS — Z923 Personal history of irradiation: Secondary | ICD-10-CM | POA: Diagnosis not present

## 2020-05-12 DIAGNOSIS — Z79899 Other long term (current) drug therapy: Secondary | ICD-10-CM | POA: Diagnosis not present

## 2020-05-12 DIAGNOSIS — Z171 Estrogen receptor negative status [ER-]: Secondary | ICD-10-CM | POA: Insufficient documentation

## 2020-05-12 DIAGNOSIS — Z8051 Family history of malignant neoplasm of kidney: Secondary | ICD-10-CM | POA: Diagnosis not present

## 2020-05-12 DIAGNOSIS — Z8349 Family history of other endocrine, nutritional and metabolic diseases: Secondary | ICD-10-CM | POA: Insufficient documentation

## 2020-05-12 DIAGNOSIS — C50512 Malignant neoplasm of lower-outer quadrant of left female breast: Secondary | ICD-10-CM

## 2020-05-12 DIAGNOSIS — Z8379 Family history of other diseases of the digestive system: Secondary | ICD-10-CM | POA: Diagnosis not present

## 2020-05-12 DIAGNOSIS — J45909 Unspecified asthma, uncomplicated: Secondary | ICD-10-CM | POA: Diagnosis not present

## 2020-05-12 DIAGNOSIS — Z806 Family history of leukemia: Secondary | ICD-10-CM | POA: Insufficient documentation

## 2020-05-12 DIAGNOSIS — E109 Type 1 diabetes mellitus without complications: Secondary | ICD-10-CM | POA: Insufficient documentation

## 2020-05-12 DIAGNOSIS — Z833 Family history of diabetes mellitus: Secondary | ICD-10-CM | POA: Diagnosis not present

## 2020-05-12 DIAGNOSIS — Z7982 Long term (current) use of aspirin: Secondary | ICD-10-CM | POA: Diagnosis not present

## 2020-05-12 DIAGNOSIS — C773 Secondary and unspecified malignant neoplasm of axilla and upper limb lymph nodes: Secondary | ICD-10-CM | POA: Diagnosis not present

## 2020-05-12 DIAGNOSIS — Z95828 Presence of other vascular implants and grafts: Secondary | ICD-10-CM

## 2020-05-12 DIAGNOSIS — Z808 Family history of malignant neoplasm of other organs or systems: Secondary | ICD-10-CM | POA: Diagnosis not present

## 2020-05-12 DIAGNOSIS — Z5112 Encounter for antineoplastic immunotherapy: Secondary | ICD-10-CM | POA: Insufficient documentation

## 2020-05-12 DIAGNOSIS — Z803 Family history of malignant neoplasm of breast: Secondary | ICD-10-CM | POA: Insufficient documentation

## 2020-05-12 DIAGNOSIS — Z8049 Family history of malignant neoplasm of other genital organs: Secondary | ICD-10-CM | POA: Insufficient documentation

## 2020-05-12 LAB — CBC WITH DIFFERENTIAL/PLATELET
Abs Immature Granulocytes: 0.01 10*3/uL (ref 0.00–0.07)
Basophils Absolute: 0 10*3/uL (ref 0.0–0.1)
Basophils Relative: 1 %
Eosinophils Absolute: 0.4 10*3/uL (ref 0.0–0.5)
Eosinophils Relative: 8 %
HCT: 34.1 % — ABNORMAL LOW (ref 36.0–46.0)
Hemoglobin: 10.7 g/dL — ABNORMAL LOW (ref 12.0–15.0)
Immature Granulocytes: 0 %
Lymphocytes Relative: 14 %
Lymphs Abs: 0.7 10*3/uL (ref 0.7–4.0)
MCH: 29.9 pg (ref 26.0–34.0)
MCHC: 31.4 g/dL (ref 30.0–36.0)
MCV: 95.3 fL (ref 80.0–100.0)
Monocytes Absolute: 0.5 10*3/uL (ref 0.1–1.0)
Monocytes Relative: 11 %
Neutro Abs: 3.1 10*3/uL (ref 1.7–7.7)
Neutrophils Relative %: 66 %
Platelets: 180 10*3/uL (ref 150–400)
RBC: 3.58 MIL/uL — ABNORMAL LOW (ref 3.87–5.11)
RDW: 17.3 % — ABNORMAL HIGH (ref 11.5–15.5)
WBC: 4.7 10*3/uL (ref 4.0–10.5)
nRBC: 0 % (ref 0.0–0.2)

## 2020-05-12 LAB — COMPREHENSIVE METABOLIC PANEL
ALT: 17 U/L (ref 0–44)
AST: 24 U/L (ref 15–41)
Albumin: 3.3 g/dL — ABNORMAL LOW (ref 3.5–5.0)
Alkaline Phosphatase: 108 U/L (ref 38–126)
Anion gap: 8 (ref 5–15)
BUN: 13 mg/dL (ref 8–23)
CO2: 25 mmol/L (ref 22–32)
Calcium: 9 mg/dL (ref 8.9–10.3)
Chloride: 109 mmol/L (ref 98–111)
Creatinine, Ser: 0.82 mg/dL (ref 0.44–1.00)
GFR, Estimated: >60 mL/min (ref >60)
Glucose, Bld: 117 mg/dL — ABNORMAL HIGH (ref 70–99)
Potassium: 3.7 mmol/L (ref 3.5–5.1)
Sodium: 142 mmol/L (ref 135–145)
Total Bilirubin: 0.5 mg/dL (ref 0.3–1.2)
Total Protein: 6.5 g/dL (ref 6.5–8.1)

## 2020-05-12 MED ORDER — DIPHENHYDRAMINE HCL 25 MG PO CAPS
25.0000 mg | ORAL_CAPSULE | Freq: Once | ORAL | Status: AC
  Administered 2020-05-12: 25 mg via ORAL

## 2020-05-12 MED ORDER — SODIUM CHLORIDE 0.9 % IV SOLN
3.6000 mg/kg | Freq: Once | INTRAVENOUS | Status: AC
  Administered 2020-05-12: 300 mg via INTRAVENOUS
  Filled 2020-05-12: qty 15

## 2020-05-12 MED ORDER — ACETAMINOPHEN 325 MG PO TABS
650.0000 mg | ORAL_TABLET | Freq: Once | ORAL | Status: AC
  Administered 2020-05-12: 650 mg via ORAL

## 2020-05-12 MED ORDER — SODIUM CHLORIDE 0.9 % IV SOLN
Freq: Once | INTRAVENOUS | Status: AC
  Filled 2020-05-12: qty 250

## 2020-05-12 MED ORDER — SODIUM CHLORIDE 0.9% FLUSH
10.0000 mL | Freq: Once | INTRAVENOUS | Status: AC
  Administered 2020-05-12: 10 mL
  Filled 2020-05-12: qty 10

## 2020-05-12 MED ORDER — ACETAMINOPHEN 325 MG PO TABS
ORAL_TABLET | ORAL | Status: AC
  Filled 2020-05-12: qty 2

## 2020-05-12 MED ORDER — DIPHENHYDRAMINE HCL 25 MG PO CAPS
ORAL_CAPSULE | ORAL | Status: AC
  Filled 2020-05-12: qty 1

## 2020-05-12 MED ORDER — SODIUM CHLORIDE 0.9% FLUSH
10.0000 mL | INTRAVENOUS | Status: DC | PRN
  Administered 2020-05-12: 10 mL
  Filled 2020-05-12: qty 10

## 2020-05-12 MED ORDER — HEPARIN SOD (PORK) LOCK FLUSH 100 UNIT/ML IV SOLN
500.0000 [IU] | Freq: Once | INTRAVENOUS | Status: AC | PRN
  Administered 2020-05-12: 500 [IU]
  Filled 2020-05-12: qty 5

## 2020-05-12 NOTE — Patient Instructions (Signed)
[facility] Discharge Instructions for Patients Receiving Chemotherapy  Today you received the following chemotherapy agents Kadcyla  To help prevent nausea and vomiting after your treatment, we encourage you to take your nausea medication as directed   If you develop nausea and vomiting that is not controlled by your nausea medication, call the clinic.   BELOW ARE SYMPTOMS THAT SHOULD BE REPORTED IMMEDIATELY:  *FEVER GREATER THAN 100.5 F  *CHILLS WITH OR WITHOUT FEVER  NAUSEA AND VOMITING THAT IS NOT CONTROLLED WITH YOUR NAUSEA MEDICATION  *UNUSUAL SHORTNESS OF BREATH  *UNUSUAL BRUISING OR BLEEDING  TENDERNESS IN MOUTH AND THROAT WITH OR WITHOUT PRESENCE OF ULCERS  *URINARY PROBLEMS  *BOWEL PROBLEMS  UNUSUAL RASH Items with * indicate a potential emergency and should be followed up as soon as possible.  Feel free to call the clinic should you have any questions or concerns. The clinic phone number is [phone].  Please show the CHEMO ALERT CARD at check-in to the Emergency Department and triage nurse.   

## 2020-05-13 NOTE — Progress Notes (Signed)
CARDIO-ONCOLOGY CLINIC NOTE  Referring Physician: Dr. Antoine Primas, MD as PCP - General (Internal Medicine)  HPI:  Julie Thompson is 74 y.o. female with left breast cancer referred by Dr. Jana Hakim for enrollment into the Cardio-Oncology program.  Has h/o hyperlipidemia, HTN and hypothyroidism. No known cardiac disease   Oncology history:   (1) status post left breast lower outer quadrant biopsy 07/09/2019 for a clinical T2 N1, stage IIIB invasive ductal carcinoma, grade 3, triple negative, with an MIB-1 of 60%             (a) chest CT scan and bone scan 07/31/2019 showed no evidence of metastatic disease             (b) PET scan 11/21/2019 shows a 1.2 cm nodule in the left breast with an SUV max of 2.7, aortic calcification, but no lung liver or bone lesions, no evidence of metastases  (2) treated with neoadjuvant chemotherapy consisting of cyclophosphamide and doxorubicin in dose dense fashion x4 started 07/30/2019, completed 09/10/2019, followed by weekly carboplatin and paclitaxel x12 started 09/24/2019             (a) cycle 6 carboplatin/paclitaxel delayed 2 weeks because of neutropenia: granix added             (b) cycle 12 of carboplatin/paclitaxel omitted secondary to persistent low counts  (3) left lumpectomy with targeted axillary lymph node dissection 01/16/2020 showed a ypTIc ypN1 invasive ductal carcinoma, grade 3             (a) a single axillary lymph node was removed             (b) repeat prognostic panel on the surgical specimen showed the tumor to be estrogen and progesterone receptor negative, MIB-110%, and HER-2 positive with a signals ratio of 2.97 and a copy number per cell 4.90.  (4) s/p XRT  (5) T-DM1/ Kadcyla to start 02/18/2020, to be repeated Q3W x 14 weeks            Here for routine f/u. Tolerating therapy well every 3 weeks. Occasional SOB with activity. Not changed. No edema, orthopnea or PND.  Echo 05/14/20 EF 60-65% GLS -20.2%  Personally reviewed  Echo 02/12/20 EF 60-65% Grade I DD mild AoV thickening GLS -22.7%  ECHO: 1/21 (pre-chemo):  EF 60-65% Grade I DD      Past Medical History:  Diagnosis Date  . Arthritis 2012  . Asthma    no problems now  . Family history of basal cell carcinoma   . Family history of breast cancer   . Family history of colon cancer   . Family history of kidney cancer   . Family history of leukemia   . Family history of peritoneal cancer   . GERD (gastroesophageal reflux disease)   . H/O hiatal hernia   . Hyperlipidemia   . Hypothyroidism   . IBS (irritable bowel syndrome)   . lt breast ca dx'd 07/10/19    Current Outpatient Medications  Medication Sig Dispense Refill  . acetaminophen (TYLENOL) 650 MG CR tablet Take 650 mg by mouth every 8 (eight) hours as needed for pain.    Marland Kitchen aspirin EC 81 MG tablet Take 81 mg by mouth daily.    Marland Kitchen atorvastatin (LIPITOR) 20 MG tablet Take 20 mg by mouth daily.    . calcium citrate-vitamin D (CITRACAL+D) 315-200 MG-UNIT tablet Take 1 tablet by mouth 2 (two) times daily.    . Cholecalciferol (VITAMIN D) 2000  UNITS tablet Take 2,000 Units by mouth daily.    Marland Kitchen co-enzyme Q-10 30 MG capsule Take 30 mg by mouth 3 (three) times daily.     Marland Kitchen esomeprazole (NEXIUM) 20 MG capsule Take 20 mg by mouth daily at 12 noon.    . famotidine (PEPCID) 20 MG tablet Take 20 mg by mouth daily as needed for heartburn or indigestion.     Marland Kitchen FIBER PO Take 1 tablet by mouth daily.    Marland Kitchen levothyroxine (SYNTHROID) 50 MCG tablet Take 50 mcg by mouth daily before breakfast.     . lidocaine-prilocaine (EMLA) cream Apply 1 application topically as needed. 30 g 0  . loratadine (CLARITIN) 10 MG tablet Take 1 tablet (10 mg total) by mouth daily. 60 tablet 1  . Multiple Vitamin (MULTIVITAMIN WITH MINERALS) TABS tablet Take 1 tablet by mouth daily.    . nabumetone (RELAFEN) 500 MG tablet Take 500 mg by mouth 2 (two) times daily.    . Omega-3 Fatty Acids (FISH OIL) 1200 MG CAPS  Take 1,200 mg by mouth daily.    Marland Kitchen spironolactone (ALDACTONE) 25 MG tablet Take 0.5 tablets (12.5 mg total) by mouth daily. 15 tablet 3   No current facility-administered medications for this encounter.    Allergies  Allergen Reactions  . Penicillins Hives    [place]   . Sulfa Antibiotics Rash      Social History   Socioeconomic History  . Marital status: Married    Spouse name: Not on file  . Number of children: Not on file  . Years of education: Not on file  . Highest education level: Not on file  Occupational History  . Not on file  Tobacco Use  . Smoking status: Never Smoker  . Smokeless tobacco: Never Used  Vaping Use  . Vaping Use: Never used  Substance and Sexual Activity  . Alcohol use: Never  . Drug use: No  . Sexual activity: Not Currently    Birth control/protection: Surgical  Other Topics Concern  . Not on file  Social History Narrative  . Not on file   Social Determinants of Health   Financial Resource Strain:   . Difficulty of Paying Living Expenses: Not on file  Food Insecurity:   . Worried About Charity fundraiser in the Last Year: Not on file  . Ran Out of Food in the Last Year: Not on file  Transportation Needs:   . Lack of Transportation (Medical): Not on file  . Lack of Transportation (Non-Medical): Not on file  Physical Activity:   . Days of Exercise per Week: Not on file  . Minutes of Exercise per Session: Not on file  Stress:   . Feeling of Stress : Not on file  Social Connections:   . Frequency of Communication with Friends and Family: Not on file  . Frequency of Social Gatherings with Friends and Family: Not on file  . Attends Religious Services: Not on file  . Active Member of Clubs or Organizations: Not on file  . Attends Archivist Meetings: Not on file  . Marital Status: Not on file  Intimate Partner Violence:   . Fear of Current or Ex-Partner: Not on file  . Emotionally Abused: Not on file  . Physically Abused:  Not on file  . Sexually Abused: Not on file      Family History  Problem Relation Age of Onset  . Breast cancer Maternal Aunt  dx. in her 14s  . Cancer Mother 10       peritoneal cancer, death certificate lists ovarian cancer  . Colon cancer Paternal Uncle        dx. late 73s or early 49s  . Basal cell carcinoma Sister 65       a second diagnosed in her 45s  . Kidney cancer Maternal Grandmother 59  . Diabetes Paternal Grandmother   . Heart Problems Paternal Grandmother   . Leukemia Paternal Uncle        dx. early 18s    Vitals:   05/14/20 1050  BP: 118/90  Pulse: 89  SpO2: 95%  Weight: 81 kg (178 lb 9.6 oz)    PHYSICAL EXAM: General:  Well appearing. No resp difficulty HEENT: normal alopecic Neck: supple. no JVD. R sided port Carotids 2+ bilat; no bruits. No lymphadenopathy or thryomegaly appreciated. Cor: PMI nondisplaced. Regular rate & rhythm. No rubs, gallops or murmurs. Lungs: clear Abdomen: obese soft, nontender, nondistended. No hepatosplenomegaly. No bruits or masses. Good bowel sounds. Extremities: no cyanosis, clubbing, rash, edema Neuro: alert & orientedx3, cranial nerves grossly intact. moves all 4 extremities w/o difficulty. Affect pleasant   ASSESSMENT & PLAN: 1. Left Breast Cancer - initial biopsy triple negative - treated 1/21 with 4 doses of doxorubicin-based regimen - s/p left lumpectomy 7/21 with positive margins which were HER-2+ - s/p XRT - pending TDM-1 based therpy - echo 8/21 EF 60-65% Grade I DD mild AoV thickening GLS -22.7% - echo 11/21 EF 60-65% GLS -20.2% - I reviewed echos personally. EF and Doppler parameters stable. No HF on exam. Continue Herceptin.   2. HTN - started on low dose spironolactone at last visit. BP much improved. Potassium ok    Glori Bickers, MD  11:21 AM

## 2020-05-14 ENCOUNTER — Encounter (HOSPITAL_COMMUNITY): Payer: Self-pay | Admitting: Internal Medicine

## 2020-05-14 ENCOUNTER — Other Ambulatory Visit (HOSPITAL_COMMUNITY): Payer: Medicare Other

## 2020-05-14 ENCOUNTER — Ambulatory Visit (HOSPITAL_COMMUNITY)
Admission: RE | Admit: 2020-05-14 | Discharge: 2020-05-14 | Disposition: A | Discharge status: Home / Self Care | Payer: Medicare Other | Source: Ambulatory Visit | Attending: Internal Medicine | Admitting: Internal Medicine

## 2020-05-14 ENCOUNTER — Other Ambulatory Visit: Payer: Self-pay

## 2020-05-14 ENCOUNTER — Ambulatory Visit (HOSPITAL_BASED_OUTPATIENT_CLINIC_OR_DEPARTMENT_OTHER)
Admission: RE | Admit: 2020-05-14 | Discharge: 2020-05-14 | Disposition: A | Discharge status: Home / Self Care | Payer: Medicare Other | Source: Ambulatory Visit | Attending: Internal Medicine | Admitting: Internal Medicine

## 2020-05-14 VITALS — BP 118/90 | HR 89 | Wt 178.6 lb

## 2020-05-14 DIAGNOSIS — I5043 Acute on chronic combined systolic (congestive) and diastolic (congestive) heart failure: Secondary | ICD-10-CM

## 2020-05-14 DIAGNOSIS — Z5181 Encounter for therapeutic drug level monitoring: Secondary | ICD-10-CM | POA: Diagnosis not present

## 2020-05-14 DIAGNOSIS — Z923 Personal history of irradiation: Secondary | ICD-10-CM | POA: Insufficient documentation

## 2020-05-14 DIAGNOSIS — C50212 Malignant neoplasm of upper-inner quadrant of left female breast: Secondary | ICD-10-CM | POA: Insufficient documentation

## 2020-05-14 DIAGNOSIS — Z880 Allergy status to penicillin: Secondary | ICD-10-CM | POA: Insufficient documentation

## 2020-05-14 DIAGNOSIS — C50512 Malignant neoplasm of lower-outer quadrant of left female breast: Secondary | ICD-10-CM

## 2020-05-14 DIAGNOSIS — Z79890 Hormone replacement therapy (postmenopausal): Secondary | ICD-10-CM | POA: Insufficient documentation

## 2020-05-14 DIAGNOSIS — E785 Hyperlipidemia, unspecified: Secondary | ICD-10-CM | POA: Diagnosis not present

## 2020-05-14 DIAGNOSIS — Z7982 Long term (current) use of aspirin: Secondary | ICD-10-CM | POA: Insufficient documentation

## 2020-05-14 DIAGNOSIS — Z79899 Other long term (current) drug therapy: Secondary | ICD-10-CM | POA: Diagnosis not present

## 2020-05-14 DIAGNOSIS — Z0189 Encounter for other specified special examinations: Secondary | ICD-10-CM | POA: Diagnosis not present

## 2020-05-14 DIAGNOSIS — I700 Atherosclerosis of aorta: Secondary | ICD-10-CM | POA: Diagnosis not present

## 2020-05-14 DIAGNOSIS — Z171 Estrogen receptor negative status [ER-]: Secondary | ICD-10-CM | POA: Diagnosis not present

## 2020-05-14 DIAGNOSIS — Z882 Allergy status to sulfonamides status: Secondary | ICD-10-CM | POA: Insufficient documentation

## 2020-05-14 DIAGNOSIS — E039 Hypothyroidism, unspecified: Secondary | ICD-10-CM | POA: Diagnosis not present

## 2020-05-14 DIAGNOSIS — Z791 Long term (current) use of non-steroidal anti-inflammatories (NSAID): Secondary | ICD-10-CM | POA: Diagnosis not present

## 2020-05-14 DIAGNOSIS — I10 Essential (primary) hypertension: Secondary | ICD-10-CM | POA: Diagnosis not present

## 2020-05-14 LAB — ECHOCARDIOGRAM COMPLETE
Area-P 1/2: 3.12 cm2
Calc EF: 56.1 %
S' Lateral: 2 cm
Single Plane A2C EF: 43.9 %
Single Plane A4C EF: 68.7 %

## 2020-05-14 NOTE — Patient Instructions (Signed)
Your physician has requested that you have an echocardiogram. Echocardiography is a painless test that uses sound waves to create images of your heart. It provides your doctor with information about the size and shape of your heart and how well your heart's chambers and valves are working. This procedure takes approximately one hour. There are no restrictions for this procedure.  Your physician recommends that you schedule a follow-up appointment in: 3 months  If you have any questions or concerns before your next appointment please send [place] a message through [facility] or call our office at [phone].    TO LEAVE A MESSAGE FOR THE NURSE SELECT OPTION 2, PLEASE LEAVE A MESSAGE INCLUDING: . YOUR NAME . DATE OF BIRTH . CALL BACK NUMBER . REASON FOR CALL**this is important as we prioritize the call backs  [place] AS LONG AS YOU CALL BEFORE 4:00 PM  At the [facility], you and your health needs are our priority. As part of our continuing mission to provide you with exceptional heart care, we have created designated Provider Care Teams. These Care Teams include your primary Cardiologist (physician) and Advanced Practice Providers (APPs- Physician Assistants and Nurse Practitioners) who all work together to provide you with the care you need, when you need it.   You may see any of the following providers on your designated Care Team at your next follow up: Marland Kitchen Dr Glori Bickers . Dr Loralie Champagne . Darrick Grinder, NP . Lyda Jester, PA . Audry Riles, PharmD   Please be sure to bring in all your medications bottles to every appointment.

## 2020-05-14 NOTE — Addendum Note (Signed)
Encounter addended by: Stanford Scotland, RN on: 05/14/2020 11:29 AM  Actions taken: Clinical Note Signed

## 2020-05-14 NOTE — Progress Notes (Signed)
  Echocardiogram 2D Echocardiogram has been performed.  Julie Thompson 05/14/2020, 10:49 AM

## 2020-05-14 NOTE — Addendum Note (Signed)
Encounter addended by: Stanford Scotland, RN on: 05/14/2020 11:32 AM  Actions taken: Visit diagnoses modified, Order list changed, Diagnosis association updated

## 2020-05-18 NOTE — Progress Notes (Signed)
  Patient Name: Julie Thompson MRN: [id number] DOB: 03/18/1946 Referring Physician: Fara Olden RUPASHREE (Profile Not Attached) Date of Service: 04/01/2020 [facility], [place]                                                        End Of Treatment Note  Diagnoses: C50.512-Malignant neoplasm of lower-outer quadrant of left female breast  Cancer Staging: Cancer Staging Malignant neoplasm of lower-outer quadrant of left breast of female, estrogen receptor negative ([place]) Staging form: Breast, AJCC 8th Edition - Clinical stage from 07/17/2019: Stage IIIB (cT2, cN1, cM0, G3, ER-, PR-, HER2-) - Unsigned - Pathologic stage from 01/16/2020: No Stage Recommended (ypT1c, pN1a, cM0, G3, ER-, PR-, HER2+) - Signed by Gardenia Phlegm, NP on 02/05/2020  Intent: Curative  Radiation Treatment Dates: 02/17/2020 through 04/01/2020 Site Technique Total Dose (Gy) Dose per Fx (Gy) Completed Fx Beam Energies  Breast, Left: Breast_Lt 3D 50/50 2 25/25 10X  Breast, Left: Breast_Lt_SCV_PAB 3D 50/50 2 25/25 6X, 10X  Breast, Left: Breast_Lt_Bst 3D 10/10 2 5/5 6X, 10X   Narrative: The patient tolerated radiation therapy relatively well.   Plan: The patient will follow-up with radiation oncology in 43mo or as needed. -----------------------------------  Eppie Gibson, MD

## 2020-06-02 ENCOUNTER — Ambulatory Visit: Payer: Medicare Other | Admitting: Oncology

## 2020-06-02 ENCOUNTER — Inpatient Hospital Stay: Payer: Medicare Other

## 2020-06-02 ENCOUNTER — Encounter: Payer: Self-pay | Admitting: *Deleted

## 2020-06-02 ENCOUNTER — Other Ambulatory Visit: Payer: Self-pay

## 2020-06-02 VITALS — BP 155/75 | HR 78 | Temp 98.3°F | Resp 17 | Wt 179.2 lb

## 2020-06-02 DIAGNOSIS — Z79899 Other long term (current) drug therapy: Secondary | ICD-10-CM | POA: Diagnosis not present

## 2020-06-02 DIAGNOSIS — Z171 Estrogen receptor negative status [ER-]: Secondary | ICD-10-CM

## 2020-06-02 DIAGNOSIS — Z803 Family history of malignant neoplasm of breast: Secondary | ICD-10-CM | POA: Diagnosis not present

## 2020-06-02 DIAGNOSIS — Z808 Family history of malignant neoplasm of other organs or systems: Secondary | ICD-10-CM | POA: Diagnosis not present

## 2020-06-02 DIAGNOSIS — Z923 Personal history of irradiation: Secondary | ICD-10-CM | POA: Diagnosis not present

## 2020-06-02 DIAGNOSIS — C50512 Malignant neoplasm of lower-outer quadrant of left female breast: Secondary | ICD-10-CM | POA: Diagnosis not present

## 2020-06-02 DIAGNOSIS — Z8049 Family history of malignant neoplasm of other genital organs: Secondary | ICD-10-CM | POA: Diagnosis not present

## 2020-06-02 DIAGNOSIS — E109 Type 1 diabetes mellitus without complications: Secondary | ICD-10-CM | POA: Diagnosis not present

## 2020-06-02 DIAGNOSIS — Z5112 Encounter for antineoplastic immunotherapy: Secondary | ICD-10-CM | POA: Diagnosis not present

## 2020-06-02 DIAGNOSIS — C773 Secondary and unspecified malignant neoplasm of axilla and upper limb lymph nodes: Secondary | ICD-10-CM | POA: Diagnosis not present

## 2020-06-02 DIAGNOSIS — Z833 Family history of diabetes mellitus: Secondary | ICD-10-CM | POA: Diagnosis not present

## 2020-06-02 DIAGNOSIS — Z7982 Long term (current) use of aspirin: Secondary | ICD-10-CM | POA: Diagnosis not present

## 2020-06-02 DIAGNOSIS — J45909 Unspecified asthma, uncomplicated: Secondary | ICD-10-CM | POA: Diagnosis not present

## 2020-06-02 DIAGNOSIS — Z806 Family history of leukemia: Secondary | ICD-10-CM | POA: Diagnosis not present

## 2020-06-02 DIAGNOSIS — Z8051 Family history of malignant neoplasm of kidney: Secondary | ICD-10-CM | POA: Diagnosis not present

## 2020-06-02 DIAGNOSIS — Z95828 Presence of other vascular implants and grafts: Secondary | ICD-10-CM

## 2020-06-02 DIAGNOSIS — Z800 Family history of malignant neoplasm of digestive organs: Secondary | ICD-10-CM | POA: Diagnosis not present

## 2020-06-02 LAB — CBC WITH DIFFERENTIAL/PLATELET
Abs Immature Granulocytes: 0.01 10*3/uL (ref 0.00–0.07)
Basophils Absolute: 0 10*3/uL (ref 0.0–0.1)
Basophils Relative: 1 %
Eosinophils Absolute: 0.2 10*3/uL (ref 0.0–0.5)
Eosinophils Relative: 5 %
HCT: 34.8 % — ABNORMAL LOW (ref 36.0–46.0)
Hemoglobin: 10.8 g/dL — ABNORMAL LOW (ref 12.0–15.0)
Immature Granulocytes: 0 %
Lymphocytes Relative: 21 %
Lymphs Abs: 1 10*3/uL (ref 0.7–4.0)
MCH: 29.8 pg (ref 26.0–34.0)
MCHC: 31 g/dL (ref 30.0–36.0)
MCV: 96.1 fL (ref 80.0–100.0)
Monocytes Absolute: 0.5 10*3/uL (ref 0.1–1.0)
Monocytes Relative: 11 %
Neutro Abs: 3 10*3/uL (ref 1.7–7.7)
Neutrophils Relative %: 62 %
Platelets: 134 10*3/uL — ABNORMAL LOW (ref 150–400)
RBC: 3.62 MIL/uL — ABNORMAL LOW (ref 3.87–5.11)
RDW: 18.2 % — ABNORMAL HIGH (ref 11.5–15.5)
WBC: 4.7 10*3/uL (ref 4.0–10.5)
nRBC: 0 % (ref 0.0–0.2)

## 2020-06-02 LAB — COMPREHENSIVE METABOLIC PANEL
ALT: 16 U/L (ref 0–44)
AST: 28 U/L (ref 15–41)
Albumin: 3.4 g/dL — ABNORMAL LOW (ref 3.5–5.0)
Alkaline Phosphatase: 108 U/L (ref 38–126)
Anion gap: 6 (ref 5–15)
BUN: 15 mg/dL (ref 8–23)
CO2: 26 mmol/L (ref 22–32)
Calcium: 9.2 mg/dL (ref 8.9–10.3)
Chloride: 108 mmol/L (ref 98–111)
Creatinine, Ser: 0.82 mg/dL (ref 0.44–1.00)
GFR, Estimated: >60 mL/min (ref >60)
Glucose, Bld: 92 mg/dL (ref 70–99)
Potassium: 4 mmol/L (ref 3.5–5.1)
Sodium: 140 mmol/L (ref 135–145)
Total Bilirubin: 0.4 mg/dL (ref 0.3–1.2)
Total Protein: 6.7 g/dL (ref 6.5–8.1)

## 2020-06-02 MED ORDER — SODIUM CHLORIDE 0.9% FLUSH
10.0000 mL | Freq: Once | INTRAVENOUS | Status: AC
  Administered 2020-06-02: 10 mL
  Filled 2020-06-02: qty 10

## 2020-06-02 MED ORDER — ACETAMINOPHEN 325 MG PO TABS
ORAL_TABLET | ORAL | Status: AC
  Filled 2020-06-02: qty 2

## 2020-06-02 MED ORDER — SODIUM CHLORIDE 0.9 % IV SOLN
3.6000 mg/kg | Freq: Once | INTRAVENOUS | Status: AC
  Administered 2020-06-02: 300 mg via INTRAVENOUS
  Filled 2020-06-02: qty 15

## 2020-06-02 MED ORDER — HEPARIN SOD (PORK) LOCK FLUSH 100 UNIT/ML IV SOLN
500.0000 [IU] | Freq: Once | INTRAVENOUS | Status: AC | PRN
  Administered 2020-06-02: 500 [IU]
  Filled 2020-06-02: qty 5

## 2020-06-02 MED ORDER — ACETAMINOPHEN 325 MG PO TABS
650.0000 mg | ORAL_TABLET | Freq: Once | ORAL | Status: AC
  Administered 2020-06-02: 650 mg via ORAL

## 2020-06-02 MED ORDER — SODIUM CHLORIDE 0.9% FLUSH
10.0000 mL | INTRAVENOUS | Status: DC | PRN
  Administered 2020-06-02: 10 mL
  Filled 2020-06-02: qty 10

## 2020-06-02 MED ORDER — DIPHENHYDRAMINE HCL 25 MG PO CAPS
25.0000 mg | ORAL_CAPSULE | Freq: Once | ORAL | Status: AC
  Administered 2020-06-02: 25 mg via ORAL

## 2020-06-02 MED ORDER — SODIUM CHLORIDE 0.9 % IV SOLN
Freq: Once | INTRAVENOUS | Status: AC
  Filled 2020-06-02: qty 250

## 2020-06-02 MED ORDER — DIPHENHYDRAMINE HCL 25 MG PO CAPS
ORAL_CAPSULE | ORAL | Status: AC
  Filled 2020-06-02: qty 1

## 2020-06-02 NOTE — Patient Instructions (Signed)
[facility] Discharge Instructions for Patients Receiving Chemotherapy  Today you received the following chemotherapy agents Kadcyla  To help prevent nausea and vomiting after your treatment, we encourage you to take your nausea medication as directed   If you develop nausea and vomiting that is not controlled by your nausea medication, call the clinic.   BELOW ARE SYMPTOMS THAT SHOULD BE REPORTED IMMEDIATELY:  *FEVER GREATER THAN 100.5 F  *CHILLS WITH OR WITHOUT FEVER  NAUSEA AND VOMITING THAT IS NOT CONTROLLED WITH YOUR NAUSEA MEDICATION  *UNUSUAL SHORTNESS OF BREATH  *UNUSUAL BRUISING OR BLEEDING  TENDERNESS IN MOUTH AND THROAT WITH OR WITHOUT PRESENCE OF ULCERS  *URINARY PROBLEMS  *BOWEL PROBLEMS  UNUSUAL RASH Items with * indicate a potential emergency and should be followed up as soon as possible.  Feel free to call the clinic should you have any questions or concerns. The clinic phone number is [phone].  Please show the CHEMO ALERT CARD at check-in to the Emergency Department and triage nurse.   

## 2020-06-03 DIAGNOSIS — M1712 Unilateral primary osteoarthritis, left knee: Secondary | ICD-10-CM | POA: Diagnosis not present

## 2020-06-03 DIAGNOSIS — M1711 Unilateral primary osteoarthritis, right knee: Secondary | ICD-10-CM | POA: Diagnosis not present

## 2020-06-08 DIAGNOSIS — G43009 Migraine without aura, not intractable, without status migrainosus: Secondary | ICD-10-CM | POA: Diagnosis not present

## 2020-06-08 DIAGNOSIS — E7800 Pure hypercholesterolemia, unspecified: Secondary | ICD-10-CM | POA: Diagnosis not present

## 2020-06-08 DIAGNOSIS — E039 Hypothyroidism, unspecified: Secondary | ICD-10-CM | POA: Diagnosis not present

## 2020-06-08 DIAGNOSIS — I10 Essential (primary) hypertension: Secondary | ICD-10-CM | POA: Diagnosis not present

## 2020-06-10 DIAGNOSIS — M1712 Unilateral primary osteoarthritis, left knee: Secondary | ICD-10-CM | POA: Diagnosis not present

## 2020-06-10 DIAGNOSIS — M1711 Unilateral primary osteoarthritis, right knee: Secondary | ICD-10-CM | POA: Diagnosis not present

## 2020-06-19 DIAGNOSIS — M1712 Unilateral primary osteoarthritis, left knee: Secondary | ICD-10-CM | POA: Diagnosis not present

## 2020-06-19 DIAGNOSIS — M1711 Unilateral primary osteoarthritis, right knee: Secondary | ICD-10-CM | POA: Diagnosis not present

## 2020-06-22 NOTE — Progress Notes (Signed)
[place]  [phone] [phone]     ID: [name] DOB: [birth year]  MR#: [id number]  INO#:[id number]  Patient Care Team: Leeroy Cha, MD as PCP - General (Internal Medicine) Rockwell Germany, RN as Oncology Nurse Navigator Mauro Kaufmann, RN as Oncology Nurse Navigator Eppie Gibson, MD as Attending Physician (Radiation Oncology) Donnie Mesa, MD as Consulting Physician (General Surgery) Ethelean Colla, Virgie Dad, MD as Consulting Physician (Oncology) Melrose Nakayama, MD as Consulting Physician (Orthopedic Surgery) Bensimhon, Shaune Pascal, MD as Consulting Physician (Cardiology) Chauncey Cruel, MD OTHER MD:  CHIEF COMPLAINT: 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   She continues on adjuvant Kadcyla/ T-DM1.  Today is 7 of 14 planned doses.  She has generally tolerated this well.  Since her last visit, she underwent repeat echocardiogram on 05/14/2020 showing an ejection fraction of 55-60%. This is only slightly lower than prior in 02/2020.  Of note, she is scheduled for bone density screening on 08/13/2020.   REVIEW OF SYSTEMS: Jeremiah generally does remarkably well, although she tells me on day 2 of the most recent cycle she noted very minimal nausea which resolved as soon as she put something in her stomach. She did not need to take any nausea medicine. She is trying to walk more. 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 She tells me she did not have the sensations, which can bother her at night, before starting the current treatment.   COVID 19 VACCINATION STATUS: Status  post Pfizer x1, July 2021    HISTORY OF CURRENT ILLNESS: From the original intake note:  Anasophia Pecor had routine screening mammography on 06/21/2019 showing a possible abnormality in the left breast. She underwent left diagnostic mammography with tomography and left breast ultrasonography at [facility] on 07/01/2019 showing: breast density category C; either 2 adjacent masses or 1 complex dumbbell-shaped mass containing calcifications in the left breast at 6 o'clock, the largest of which measures 2.1 cm; chest wall invasion not excluded; 1-2 mm group of calcifications just anterior to the mass(es); single abnormal lymph node in the left axilla; vascular calcifications in anterior left breast.  Accordingly on 07/09/2019 she proceeded to biopsy of the left breast areas in question. The pathology from this procedure ([id number]) showed:  1. Left Breast, 5:30, posterior  - invasive ductal carcinoma with necrosis, grade 3  - Prognostic indicators significant for: estrogen receptor, 0% negative and progesterone receptor, 0% negative. Proliferation marker Ki67 at 60%. HER2 negative by immunohistochemistry (0). 2. Left Breast, 5:30, posterior  - invasive ductal carcinoma with necrosis, grade 3  - ductal carcinoma in situ, intermediate grade 3. Left Axilla, lymph node (1)  - metastatic carcinoma involving a lymph node  And additional biopsy of the anterior vascular calcifications in the left breast was performed on 07/10/2019. Pathology [id number]) showed: fibrocystic changes with calcifications; no malignancy identified.  This was felt to be concordant.  The patient's subsequent history is as detailed below.   PAST MEDICAL HISTORY: Past Medical History:  Diagnosis Date  . Arthritis 2012  . Asthma    no problems now  . Family history of basal cell carcinoma   .  Family history of breast cancer   . Family history of colon cancer   . Family history of kidney cancer   . Family history of  leukemia   . Family history of peritoneal cancer   . GERD (gastroesophageal reflux disease)   . H/O hiatal hernia   . Hyperlipidemia   . Hypothyroidism   . IBS (irritable bowel syndrome)   . lt breast ca dx'd 07/10/19    PAST SURGICAL HISTORY: Past Surgical History:  Procedure Laterality Date  . ABDOMINAL HYSTERECTOMY     age 37  . BREAST LUMPECTOMY WITH RADIOACTIVE SEED AND AXILLARY LYMPH NODE DISSECTION Left 01/16/2020   Procedure: LEFT BREAST LUMPECTOMY WITH RADIOACTIVE SEED AND TARGETED AXILLARY LYMPH NODE DISSECTION;  Surgeon: Donnie Mesa, MD;  Location: [facility];  Service: General;  Laterality: Left;  LMA  . BREAST SURGERY    . DILATION AND CURETTAGE OF UTERUS     2 miscarriages age 64 & 63  . FLEXIBLE SIGMOIDOSCOPY N/A 10/22/2013   Procedure: FLEXIBLE SIGMOIDOSCOPY;  Surgeon: Garlan Fair, MD;  Location: [facility];  Service: Endoscopy;  Laterality: N/A;  . KNEE ARTHROSCOPY Left   . PORT-A-CATH REMOVAL N/A 01/16/2020   Procedure: REMOVAL PORT-A-CATH;  Surgeon: Donnie Mesa, MD;  Location: [facility];  Service: General;  Laterality: N/A;  . PORTACATH PLACEMENT Right 07/29/2019   Procedure: INSERTION PORT-A-CATH WITH ULTRASOUND;  Surgeon: Donnie Mesa, MD;  Location: [facility];  Service: General;  Laterality: Right;  . PORTACATH PLACEMENT N/A 02/13/2020   Procedure: INSERTION PORT-A-CATH WITH ULTRASOUND GUIDANCE;  Surgeon: Donnie Mesa, MD;  Location: [facility];  Service: General;  Laterality: N/A;  . TONSILLECTOMY     age 23    FAMILY HISTORY: Family History  Problem Relation Age of Onset  . Breast cancer Maternal Aunt        dx. in her 11s  . Cancer Mother 92       peritoneal cancer, death certificate lists ovarian cancer  . Colon cancer Paternal Uncle        dx. late 52s or early 15s  . Basal cell carcinoma Sister 93       a second diagnosed in her 16s  . Kidney cancer Maternal Grandmother 68  . Diabetes Paternal Grandmother   .  Heart Problems Paternal Grandmother   . Leukemia Paternal Uncle        dx. early 1s  Patient's father is currently living at age 71 as of 07/22/19. Patient's mother died from ovarian cancer at age 71. She was diagnosed at age 58. The patient reports breast cancer in a maternal aunt, diagnosed in her 88's. The patient also noted colon cancer in a paternal uncle and kidney cancer in her maternal grandmother.  She has 1 sister.   GYNECOLOGIC HISTORY:  No LMP recorded. Patient has had a hysterectomy. Menarche: [age] years old Age at first live birth: 74 years old [place] P 2 LMP age 56 (with hysterectomy) Contraceptive: never used HRT used for 10-12 years  Hysterectomy? Yes, age 69 BSO? 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  Husband [name] is a retired Psychologist, prison and probation services. She lives at home with husband Butch. She has two children from a prior marriage. Son [name], age 34, works as a Games developer in [place], [place]. Son [name], age 62, works as a Programme researcher, broadcasting/film/video in [place], [place]. The patient has 4 grandchildren and 4 great-grandchildren.  She attends [employer].    ADVANCED DIRECTIVES: In the absence of any documentation to the contrary, the patient's spouse is their HCPOA.    HEALTH MAINTENANCE: Social History   Tobacco Use  . Smoking status: Never Smoker  . Smokeless tobacco: Never Used  Vaping Use  . Vaping Use: Never used  Substance Use Topics  . Alcohol use: Never  . Drug use: No     Colonoscopy: 09/2013, with CT virtual 11/2013  PAP: none on file, s/p hysterectomy  Bone density: 04/2001, -0.5   Allergies  Allergen Reactions  . Penicillins Hives    [place]   . Sulfa Antibiotics Rash    Current Outpatient Medications  Medication Sig Dispense Refill  . acetaminophen (TYLENOL) 650 MG CR tablet Take 650 mg by mouth every 8 (eight) hours as needed for pain.    Marland Kitchen aspirin EC 81 MG tablet  Take 81 mg by mouth daily.    Marland Kitchen atorvastatin (LIPITOR) 20 MG tablet Take 20 mg by mouth daily.    . calcium citrate-vitamin D (CITRACAL+D) 315-200 MG-UNIT tablet Take 1 tablet by mouth 2 (two) times daily.    . Cholecalciferol (VITAMIN D) 2000 UNITS tablet Take 2,000 Units by mouth daily.    Marland Kitchen co-enzyme Q-10 30 MG capsule Take 30 mg by mouth 3 (three) times daily.     Marland Kitchen esomeprazole (NEXIUM) 20 MG capsule Take 20 mg by mouth daily at 12 noon.    . famotidine (PEPCID) 20 MG tablet Take 20 mg by mouth daily as needed for heartburn or indigestion.     Marland Kitchen FIBER PO Take 1 tablet by mouth daily.    Marland Kitchen levothyroxine (SYNTHROID) 50 MCG tablet Take 50 mcg by mouth daily before breakfast.     . lidocaine-prilocaine (EMLA) cream Apply 1 application topically as needed. 30 g 0  . loratadine (CLARITIN) 10 MG tablet Take 1 tablet (10 mg total) by mouth daily. 60 tablet 1  . Multiple Vitamin (MULTIVITAMIN WITH MINERALS) TABS tablet Take 1 tablet by mouth daily.    . nabumetone (RELAFEN) 500 MG tablet Take 500 mg by mouth 2 (two) times daily.    . Omega-3 Fatty Acids (FISH OIL) 1200 MG CAPS Take 1,200 mg by mouth daily.    Marland Kitchen spironolactone (ALDACTONE) 25 MG tablet Take 0.5 tablets (12.5 mg total) by mouth daily. 15 tablet 3   No current facility-administered medications for this visit.   Facility-Administered Medications Ordered in Other Visits  Medication Dose Route Frequency Provider Last Rate Last Admin  . sodium chloride flush (NS) 0.9 % injection 10 mL  10 mL Intracatheter PRN Priseis Cratty, Virgie Dad, MD   10 mL at 06/23/20 1446    OBJECTIVE: White woman who appears stated age 15:   06/23/20 1150  BP: 135/70  Pulse: 81  Resp: 18  Temp: 97.7 F (36.5 C)  SpO2: 96%     Body mass index is 34.7 kg/m.   Wt Readings from Last 3 Encounters:  06/23/20 177 lb 11.2 oz (80.6 kg)  06/02/20 179 lb 4 oz (81.3 kg)  05/14/20 178 lb 9.6 oz (81 kg)  ECOG FS:1 - Symptomatic but completely ambulatory  Sclerae  unicteric, EOMs intact Wearing a mask No cervical or supraclavicular adenopathy Lungs no rales or rhonchi Heart regular rate and rhythm Abd soft, nontender, positive bowel sounds MSK no focal spinal tenderness, no upper extremity lymphedema Neuro: nonfocal, well oriented, appropriate affect Breasts: Deferred   LAB RESULTS:  CMP  Component Value Date/Time   NA 141 06/23/2020 1111   K 4.0 06/23/2020 1111   CL 108 06/23/2020 1111   CO2 27 06/23/2020 1111   GLUCOSE 88 06/23/2020 1111   BUN 18 06/23/2020 1111   CREATININE 0.80 06/23/2020 1111   CREATININE 0.94 07/17/2019 0823   CALCIUM 9.2 06/23/2020 1111   PROT 6.8 06/23/2020 1111   ALBUMIN 3.6 06/23/2020 1111   AST 26 06/23/2020 1111   AST 21 07/17/2019 0823   ALT 19 06/23/2020 1111   ALT 18 07/17/2019 0823   ALKPHOS 105 06/23/2020 1111   BILITOT 0.4 06/23/2020 1111   BILITOT 0.4 07/17/2019 0823   GFRNONAA >60 06/23/2020 1111   GFRNONAA >60 07/17/2019 0823   GFRAA >60 03/30/2020 0910   GFRAA >60 07/17/2019 0823    No results found for: TOTALPROTELP, ALBUMINELP, A1GS, A2GS, BETS, BETA2SER, GAMS, MSPIKE, SPEI  Lab Results  Component Value Date   WBC 3.7 (L) 06/23/2020   NEUTROABS 2.2 06/23/2020   HGB 11.2 (L) 06/23/2020   HCT 35.3 (L) 06/23/2020   MCV 94.4 06/23/2020   PLT 119 (L) 06/23/2020    No results found for: LABCA2  No components found for: SAYTKZ601  No results for input(s): INR in the last 168 hours.  No results found for: LABCA2  No results found for: UXN235  No results found for: TDD220  No results found for: URK270  No results found for: CA2729  No components found for: HGQUANT  No results found for: CEA1 / No results found for: CEA1   No results found for: AFPTUMOR  No results found for: CHROMOGRNA  No results found for: KPAFRELGTCHN, LAMBDASER, KAPLAMBRATIO (kappa/lambda light chains)  No results found for: HGBA, HGBA2QUANT, HGBFQUANT, HGBSQUAN (Hemoglobinopathy evaluation)    No results found for: LDH  No results found for: IRON, TIBC, IRONPCTSAT (Iron and TIBC)  No results found for: FERRITIN  Urinalysis No results found for: COLORURINE, APPEARANCEUR, LABSPEC, PHURINE, GLUCOSEU, HGBUR, BILIRUBINUR, KETONESUR, PROTEINUR, UROBILINOGEN, NITRITE, LEUKOCYTESUR   STUDIES: No results found.   ELIGIBLE FOR AVAILABLE RESEARCH PROTOCOL: no  ASSESSMENT: 73 y.o. [place] woman status post left breast lower outer quadrant biopsy 07/09/2019 for a clinical T2 N1, stage IIIB invasive ductal carcinoma, grade 3, triple negative, with an MIB-1 of 60%  (a) chest CT scan and bone scan 07/31/2019 showed no evidence of metastatic disease  (b) MRI biopsy of 2 additional areas in the left breast (central, 07/10/2019, and at 8:30 o'clock on 07/26/2019) are both benign and concordant  (c) biopsy of 2 suspicious areas in the right breast on 07/26/2019 (9:00 and lower outer quadrant) were benign and concordant  (d) PET scan 11/21/2019 shows a 1.2 cm nodule in the left breast with an SUV max of 2.7, aortic calcification, but no lung liver or bone lesions, no evidence of metastases   (1) neoadjuvant chemotherapy consisting of cyclophosphamide and doxorubicin in dose dense fashion x4 started 07/30/2019, completed 09/10/2019, followed by weekly carboplatin and paclitaxel x12 started 09/24/2019  (a) cycle 6 carboplatin/paclitaxel delayed 2 weeks because of neutropenia: granix added  (b) cycle 12 of carboplatin/paclitaxel omitted secondary to persistent low counts  (2) left lumpectomy with targeted axillary lymph node dissection 01/16/2020 showed a ypTIc ypN1 invasive ductal carcinoma, grade 3  (a) a single axillary lymph node was removed  (b) repeat prognostic panel on the surgical specimen showed the tumor to be estrogen and progesterone receptor negative, MIB-110%, and HER-2 positive with a signals ratio of 2.97 and a copy  number per cell 4.90.  (3) adjuvant radiation completed  04/01/2020.  (4) genetics testing 07/23/2019 through the STAT Breast cancer panel offered by [facility] found no deleterious mutations in ATM, BRCA1, BRCA2, CDH1, CHEK2, PALB2, PTEN, STK11 and TP53.  Additional testing through the Common Hereditary Cancers Panel offered by [facility] also found no deleterious mutations in IPC, ATM, AXIN2, BARD1, BMPR1A, BRCA1, BRCA2, BRIP1, CDH1, CDK4, CDKN2A (p14ARF), CDKN2A (p16INK4a), CHEK2, CTNNA1, DICER1, EPCAM (Deletion/duplication testing only), GREM1 (promoter region deletion/duplication testing only), KIT, MEN1, MLH1, MSH2, MSH3, MSH6, MUTYH, NBN, NF1, NHTL1, PALB2, PDGFRA, PMS2, POLD1, POLE, PTEN, RAD50, RAD51C, RAD51D, RNF43, SDHB, SDHC, SDHD, SMAD4, SMARCA4. STK11, TP53, TSC1, TSC2, and VHL.  The following genes were evaluated for sequence changes only: SDHA and HOXB13 c.251G>A variant only.   (a) 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   (5) adjuvant T-DM1/ Kadcyla started 02/18/2020, to be repeated Q3W x 14  (a) echo 02/12/2020 shows an ejection fraction in the 60-65% range  (b) echo 05/14/2020   PLAN: Arlinda is continuing to tolerate the T-DM1 without any major side effects. Her heart function remains in the normal range although it was minimally decreased. The plan is to continue through to a total of 14 doses.  I think the unusual peripheral neuropathy that she is experiencing is going to be due to her prior paclitaxel treatment. We are going to try gabapentin 100 mg at bedtime since bedtime is the only time when this actually bothers her.  We are seeing her with every other dose or so but she knows to let us know if any problems develop before the next scheduled visit.  Total encounter time 25 minutes.Sarajane Jews C. Samuella Rasool, MD 06/23/20 5:54 PM Medical Oncology and Hematology [facility] [place], [place] [address] Tel. [phone]    Fax. [phone]   I, Wilburn Mylar,  am acting as scribe for Dr. Virgie Dad. Galvin Aversa.  I, Lurline Del MD, have reviewed the above documentation for accuracy and completeness, and I agree with the above.   *Total Encounter Time as defined by the Centers for Medicare and Medicaid Services includes, in addition to the face-to-face time of a patient visit (documented in the note above) non-face-to-face time: obtaining and reviewing outside history, ordering and reviewing medications, tests or procedures, care coordination (communications with other health care professionals or caregivers) and documentation in the medical record.

## 2020-06-23 ENCOUNTER — Inpatient Hospital Stay: Payer: Medicare Other

## 2020-06-23 ENCOUNTER — Inpatient Hospital Stay (HOSPITAL_BASED_OUTPATIENT_CLINIC_OR_DEPARTMENT_OTHER): Payer: Medicare Other | Admitting: Oncology

## 2020-06-23 ENCOUNTER — Inpatient Hospital Stay: Payer: Medicare Other | Attending: Oncology

## 2020-06-23 ENCOUNTER — Encounter: Payer: Self-pay | Admitting: *Deleted

## 2020-06-23 ENCOUNTER — Other Ambulatory Visit: Payer: Self-pay

## 2020-06-23 VITALS — BP 135/70 | HR 81 | Temp 97.7°F | Resp 18 | Ht 60.0 in | Wt 177.7 lb

## 2020-06-23 DIAGNOSIS — Z803 Family history of malignant neoplasm of breast: Secondary | ICD-10-CM | POA: Insufficient documentation

## 2020-06-23 DIAGNOSIS — G629 Polyneuropathy, unspecified: Secondary | ICD-10-CM | POA: Diagnosis not present

## 2020-06-23 DIAGNOSIS — E109 Type 1 diabetes mellitus without complications: Secondary | ICD-10-CM | POA: Insufficient documentation

## 2020-06-23 DIAGNOSIS — C50512 Malignant neoplasm of lower-outer quadrant of left female breast: Secondary | ICD-10-CM | POA: Diagnosis not present

## 2020-06-23 DIAGNOSIS — Z800 Family history of malignant neoplasm of digestive organs: Secondary | ICD-10-CM | POA: Diagnosis not present

## 2020-06-23 DIAGNOSIS — Z90710 Acquired absence of both cervix and uterus: Secondary | ICD-10-CM | POA: Insufficient documentation

## 2020-06-23 DIAGNOSIS — Z8051 Family history of malignant neoplasm of kidney: Secondary | ICD-10-CM | POA: Insufficient documentation

## 2020-06-23 DIAGNOSIS — Z79899 Other long term (current) drug therapy: Secondary | ICD-10-CM | POA: Insufficient documentation

## 2020-06-23 DIAGNOSIS — J45909 Unspecified asthma, uncomplicated: Secondary | ICD-10-CM | POA: Insufficient documentation

## 2020-06-23 DIAGNOSIS — C773 Secondary and unspecified malignant neoplasm of axilla and upper limb lymph nodes: Secondary | ICD-10-CM | POA: Diagnosis not present

## 2020-06-23 DIAGNOSIS — Z171 Estrogen receptor negative status [ER-]: Secondary | ICD-10-CM

## 2020-06-23 DIAGNOSIS — Z8349 Family history of other endocrine, nutritional and metabolic diseases: Secondary | ICD-10-CM | POA: Insufficient documentation

## 2020-06-23 DIAGNOSIS — Z5112 Encounter for antineoplastic immunotherapy: Secondary | ICD-10-CM | POA: Insufficient documentation

## 2020-06-23 DIAGNOSIS — Z923 Personal history of irradiation: Secondary | ICD-10-CM | POA: Insufficient documentation

## 2020-06-23 DIAGNOSIS — Z8041 Family history of malignant neoplasm of ovary: Secondary | ICD-10-CM | POA: Diagnosis not present

## 2020-06-23 DIAGNOSIS — Z95828 Presence of other vascular implants and grafts: Secondary | ICD-10-CM

## 2020-06-23 DIAGNOSIS — Z170 Estrogen receptor positive status [ER+]: Secondary | ICD-10-CM | POA: Insufficient documentation

## 2020-06-23 DIAGNOSIS — Z7982 Long term (current) use of aspirin: Secondary | ICD-10-CM | POA: Diagnosis not present

## 2020-06-23 DIAGNOSIS — Z833 Family history of diabetes mellitus: Secondary | ICD-10-CM | POA: Insufficient documentation

## 2020-06-23 LAB — COMPREHENSIVE METABOLIC PANEL
ALT: 19 U/L (ref 0–44)
AST: 26 U/L (ref 15–41)
Albumin: 3.6 g/dL (ref 3.5–5.0)
Alkaline Phosphatase: 105 U/L (ref 38–126)
Anion gap: 6 (ref 5–15)
BUN: 18 mg/dL (ref 8–23)
CO2: 27 mmol/L (ref 22–32)
Calcium: 9.2 mg/dL (ref 8.9–10.3)
Chloride: 108 mmol/L (ref 98–111)
Creatinine, Ser: 0.8 mg/dL (ref 0.44–1.00)
GFR, Estimated: >60 mL/min (ref >60)
Glucose, Bld: 88 mg/dL (ref 70–99)
Potassium: 4 mmol/L (ref 3.5–5.1)
Sodium: 141 mmol/L (ref 135–145)
Total Bilirubin: 0.4 mg/dL (ref 0.3–1.2)
Total Protein: 6.8 g/dL (ref 6.5–8.1)

## 2020-06-23 LAB — CBC WITH DIFFERENTIAL/PLATELET
Abs Immature Granulocytes: 0 10*3/uL (ref 0.00–0.07)
Basophils Absolute: 0 10*3/uL (ref 0.0–0.1)
Basophils Relative: 0 %
Eosinophils Absolute: 0.2 10*3/uL (ref 0.0–0.5)
Eosinophils Relative: 5 %
HCT: 35.3 % — ABNORMAL LOW (ref 36.0–46.0)
Hemoglobin: 11.2 g/dL — ABNORMAL LOW (ref 12.0–15.0)
Immature Granulocytes: 0 %
Lymphocytes Relative: 22 %
Lymphs Abs: 0.8 10*3/uL (ref 0.7–4.0)
MCH: 29.9 pg (ref 26.0–34.0)
MCHC: 31.7 g/dL (ref 30.0–36.0)
MCV: 94.4 fL (ref 80.0–100.0)
Monocytes Absolute: 0.5 10*3/uL (ref 0.1–1.0)
Monocytes Relative: 13 %
Neutro Abs: 2.2 10*3/uL (ref 1.7–7.7)
Neutrophils Relative %: 60 %
Platelets: 119 10*3/uL — ABNORMAL LOW (ref 150–400)
RBC: 3.74 MIL/uL — ABNORMAL LOW (ref 3.87–5.11)
RDW: 18.1 % — ABNORMAL HIGH (ref 11.5–15.5)
WBC: 3.7 10*3/uL — ABNORMAL LOW (ref 4.0–10.5)
nRBC: 0 % (ref 0.0–0.2)

## 2020-06-23 MED ORDER — SODIUM CHLORIDE 0.9% FLUSH
10.0000 mL | INTRAVENOUS | Status: DC | PRN
  Administered 2020-06-23: 10 mL
  Filled 2020-06-23: qty 10

## 2020-06-23 MED ORDER — ACETAMINOPHEN 325 MG PO TABS
650.0000 mg | ORAL_TABLET | Freq: Once | ORAL | Status: AC
  Administered 2020-06-23: 650 mg via ORAL

## 2020-06-23 MED ORDER — SODIUM CHLORIDE 0.9 % IV SOLN
Freq: Once | INTRAVENOUS | Status: AC
  Filled 2020-06-23: qty 250

## 2020-06-23 MED ORDER — SODIUM CHLORIDE 0.9% FLUSH
10.0000 mL | Freq: Once | INTRAVENOUS | Status: AC
  Administered 2020-06-23: 10 mL
  Filled 2020-06-23: qty 10

## 2020-06-23 MED ORDER — DIPHENHYDRAMINE HCL 25 MG PO CAPS
25.0000 mg | ORAL_CAPSULE | Freq: Once | ORAL | Status: AC
  Administered 2020-06-23: 25 mg via ORAL

## 2020-06-23 MED ORDER — ACETAMINOPHEN 325 MG PO TABS
ORAL_TABLET | ORAL | Status: AC
  Filled 2020-06-23: qty 2

## 2020-06-23 MED ORDER — SODIUM CHLORIDE 0.9 % IV SOLN
3.6000 mg/kg | Freq: Once | INTRAVENOUS | Status: AC
  Administered 2020-06-23: 300 mg via INTRAVENOUS
  Filled 2020-06-23: qty 15

## 2020-06-23 MED ORDER — HEPARIN SOD (PORK) LOCK FLUSH 100 UNIT/ML IV SOLN
500.0000 [IU] | Freq: Once | INTRAVENOUS | Status: AC | PRN
Start: 2020-06-23 — End: 2020-06-23
  Administered 2020-06-23: 500 [IU]
  Filled 2020-06-23: qty 5

## 2020-06-23 MED ORDER — GABAPENTIN 100 MG PO CAPS: 300.0000 mg | ORAL_CAPSULE | Freq: Every day | ORAL | 4 refills | Status: DC

## 2020-06-23 MED ORDER — DIPHENHYDRAMINE HCL 25 MG PO CAPS
ORAL_CAPSULE | ORAL | Status: AC
  Filled 2020-06-23: qty 1

## 2020-06-23 NOTE — Patient Instructions (Signed)

## 2020-06-23 NOTE — Patient Instructions (Signed)
[facility] Discharge Instructions for Patients Receiving Chemotherapy  Today you received the following chemotherapy agents :  Kadcyla.  To help prevent nausea and vomiting after your treatment, we encourage you to take your nausea medication as prescribed.   If you develop nausea and vomiting that is not controlled by your nausea medication, call the clinic.   BELOW ARE SYMPTOMS THAT SHOULD BE REPORTED IMMEDIATELY:  *FEVER GREATER THAN 100.5 F  *CHILLS WITH OR WITHOUT FEVER  NAUSEA AND VOMITING THAT IS NOT CONTROLLED WITH YOUR NAUSEA MEDICATION  *UNUSUAL SHORTNESS OF BREATH  *UNUSUAL BRUISING OR BLEEDING  TENDERNESS IN MOUTH AND THROAT WITH OR WITHOUT PRESENCE OF ULCERS  *URINARY PROBLEMS  *BOWEL PROBLEMS  UNUSUAL RASH Items with * indicate a potential emergency and should be followed up as soon as possible.  Feel free to call the clinic should you have any questions or concerns. The clinic phone number is [phone].  Please show the CHEMO ALERT CARD at check-in to the Emergency Department and triage nurse.   

## 2020-06-29 DIAGNOSIS — M1711 Unilateral primary osteoarthritis, right knee: Secondary | ICD-10-CM | POA: Diagnosis not present

## 2020-06-29 DIAGNOSIS — M1712 Unilateral primary osteoarthritis, left knee: Secondary | ICD-10-CM | POA: Diagnosis not present

## 2020-06-29 DIAGNOSIS — M25562 Pain in left knee: Secondary | ICD-10-CM | POA: Diagnosis not present

## 2020-06-29 DIAGNOSIS — M25561 Pain in right knee: Secondary | ICD-10-CM | POA: Diagnosis not present

## 2020-07-07 DIAGNOSIS — Z79899 Other long term (current) drug therapy: Secondary | ICD-10-CM | POA: Diagnosis not present

## 2020-07-07 DIAGNOSIS — D649 Anemia, unspecified: Secondary | ICD-10-CM | POA: Diagnosis not present

## 2020-07-07 DIAGNOSIS — E039 Hypothyroidism, unspecified: Secondary | ICD-10-CM | POA: Diagnosis not present

## 2020-07-09 DIAGNOSIS — E039 Hypothyroidism, unspecified: Secondary | ICD-10-CM | POA: Diagnosis not present

## 2020-07-09 DIAGNOSIS — I10 Essential (primary) hypertension: Secondary | ICD-10-CM | POA: Diagnosis not present

## 2020-07-09 DIAGNOSIS — E7800 Pure hypercholesterolemia, unspecified: Secondary | ICD-10-CM | POA: Diagnosis not present

## 2020-07-09 DIAGNOSIS — G43009 Migraine without aura, not intractable, without status migrainosus: Secondary | ICD-10-CM | POA: Diagnosis not present

## 2020-07-14 ENCOUNTER — Other Ambulatory Visit: Payer: Self-pay

## 2020-07-14 ENCOUNTER — Inpatient Hospital Stay: Payer: Medicare Other

## 2020-07-14 ENCOUNTER — Inpatient Hospital Stay: Payer: Medicare Other | Attending: Oncology

## 2020-07-14 VITALS — BP 133/86 | HR 74 | Temp 97.8°F | Resp 16 | Wt 178.0 lb

## 2020-07-14 DIAGNOSIS — Z171 Estrogen receptor negative status [ER-]: Secondary | ICD-10-CM

## 2020-07-14 DIAGNOSIS — Z5112 Encounter for antineoplastic immunotherapy: Secondary | ICD-10-CM | POA: Diagnosis not present

## 2020-07-14 DIAGNOSIS — Z95828 Presence of other vascular implants and grafts: Secondary | ICD-10-CM

## 2020-07-14 DIAGNOSIS — C773 Secondary and unspecified malignant neoplasm of axilla and upper limb lymph nodes: Secondary | ICD-10-CM | POA: Diagnosis not present

## 2020-07-14 DIAGNOSIS — C50512 Malignant neoplasm of lower-outer quadrant of left female breast: Secondary | ICD-10-CM | POA: Insufficient documentation

## 2020-07-14 LAB — CBC WITH DIFFERENTIAL/PLATELET
Abs Immature Granulocytes: 0 10*3/uL (ref 0.00–0.07)
Basophils Absolute: 0 10*3/uL (ref 0.0–0.1)
Basophils Relative: 1 %
Eosinophils Absolute: 0.2 10*3/uL (ref 0.0–0.5)
Eosinophils Relative: 3 %
HCT: 37 % (ref 36.0–46.0)
Hemoglobin: 11.5 g/dL — ABNORMAL LOW (ref 12.0–15.0)
Immature Granulocytes: 0 %
Lymphocytes Relative: 23 %
Lymphs Abs: 1.1 10*3/uL (ref 0.7–4.0)
MCH: 29.9 pg (ref 26.0–34.0)
MCHC: 31.1 g/dL (ref 30.0–36.0)
MCV: 96.1 fL (ref 80.0–100.0)
Monocytes Absolute: 0.5 10*3/uL (ref 0.1–1.0)
Monocytes Relative: 10 %
Neutro Abs: 2.9 10*3/uL (ref 1.7–7.7)
Neutrophils Relative %: 63 %
Platelets: 105 10*3/uL — ABNORMAL LOW (ref 150–400)
RBC: 3.85 MIL/uL — ABNORMAL LOW (ref 3.87–5.11)
RDW: 17.4 % — ABNORMAL HIGH (ref 11.5–15.5)
WBC: 4.6 10*3/uL (ref 4.0–10.5)
nRBC: 0 % (ref 0.0–0.2)

## 2020-07-14 LAB — COMPREHENSIVE METABOLIC PANEL
ALT: 20 U/L (ref 0–44)
AST: 27 U/L (ref 15–41)
Albumin: 3.5 g/dL (ref 3.5–5.0)
Alkaline Phosphatase: 120 U/L (ref 38–126)
Anion gap: 6 (ref 5–15)
BUN: 14 mg/dL (ref 8–23)
CO2: 26 mmol/L (ref 22–32)
Calcium: 9 mg/dL (ref 8.9–10.3)
Chloride: 108 mmol/L (ref 98–111)
Creatinine, Ser: 0.77 mg/dL (ref 0.44–1.00)
GFR, Estimated: >60 mL/min (ref >60)
Glucose, Bld: 95 mg/dL (ref 70–99)
Potassium: 4.2 mmol/L (ref 3.5–5.1)
Sodium: 140 mmol/L (ref 135–145)
Total Bilirubin: 0.4 mg/dL (ref 0.3–1.2)
Total Protein: 6.8 g/dL (ref 6.5–8.1)

## 2020-07-14 MED ORDER — SODIUM CHLORIDE 0.9% FLUSH
10.0000 mL | INTRAVENOUS | Status: DC | PRN
  Administered 2020-07-14: 10 mL
  Filled 2020-07-14: qty 10

## 2020-07-14 MED ORDER — DIPHENHYDRAMINE HCL 25 MG PO CAPS
25.0000 mg | ORAL_CAPSULE | Freq: Once | ORAL | Status: AC
  Administered 2020-07-14: 25 mg via ORAL

## 2020-07-14 MED ORDER — ACETAMINOPHEN 325 MG PO TABS
ORAL_TABLET | ORAL | Status: AC
  Filled 2020-07-14: qty 2

## 2020-07-14 MED ORDER — HEPARIN SOD (PORK) LOCK FLUSH 100 UNIT/ML IV SOLN
500.0000 [IU] | Freq: Once | INTRAVENOUS | Status: AC | PRN
Start: 2020-07-14 — End: 2020-07-14
  Administered 2020-07-14: 500 [IU]
  Filled 2020-07-14: qty 5

## 2020-07-14 MED ORDER — ACETAMINOPHEN 325 MG PO TABS
650.0000 mg | ORAL_TABLET | Freq: Once | ORAL | Status: AC
  Administered 2020-07-14: 650 mg via ORAL

## 2020-07-14 MED ORDER — SODIUM CHLORIDE 0.9% FLUSH
10.0000 mL | Freq: Once | INTRAVENOUS | Status: AC
  Administered 2020-07-14: 10 mL
  Filled 2020-07-14: qty 10

## 2020-07-14 MED ORDER — SODIUM CHLORIDE 0.9 % IV SOLN
Freq: Once | INTRAVENOUS | Status: AC
  Filled 2020-07-14: qty 250

## 2020-07-14 MED ORDER — DIPHENHYDRAMINE HCL 25 MG PO CAPS
ORAL_CAPSULE | ORAL | Status: AC
  Filled 2020-07-14: qty 1

## 2020-07-14 MED ORDER — SODIUM CHLORIDE 0.9 % IV SOLN
3.6000 mg/kg | Freq: Once | INTRAVENOUS | Status: AC
  Administered 2020-07-14: 300 mg via INTRAVENOUS
  Filled 2020-07-14: qty 15

## 2020-07-14 NOTE — Patient Instructions (Signed)
[facility] Discharge Instructions for Patients Receiving Chemotherapy  Today you received the following chemotherapy agents: ado-trastuzumab emtansine.  To help prevent nausea and vomiting after your treatment, we encourage you to take your nausea medication as directed.   If you develop nausea and vomiting that is not controlled by your nausea medication, call the clinic.   BELOW ARE SYMPTOMS THAT SHOULD BE REPORTED IMMEDIATELY:  *FEVER GREATER THAN 100.5 F  *CHILLS WITH OR WITHOUT FEVER  NAUSEA AND VOMITING THAT IS NOT CONTROLLED WITH YOUR NAUSEA MEDICATION  *UNUSUAL SHORTNESS OF BREATH  *UNUSUAL BRUISING OR BLEEDING  TENDERNESS IN MOUTH AND THROAT WITH OR WITHOUT PRESENCE OF ULCERS  *URINARY PROBLEMS  *BOWEL PROBLEMS  UNUSUAL RASH Items with * indicate a potential emergency and should be followed up as soon as possible.  Feel free to call the clinic should you have any questions or concerns. The clinic phone number is [phone].  Please show the CHEMO ALERT CARD at check-in to the Emergency Department and triage nurse.   

## 2020-07-14 NOTE — Patient Instructions (Signed)

## 2020-08-03 NOTE — Progress Notes (Signed)
[name]  [phone]) [phone] [phone]     ID: Julie Thompson DOB: 09-01-1945  MR#: [id number]  PQZ#:[id number]  Patient Care Team: Leeroy Cha, MD as PCP - General (Internal Medicine) Rockwell Germany, RN as Oncology Nurse Navigator Mauro Kaufmann, RN as Oncology Nurse Navigator Eppie Gibson, MD as Attending Physician (Radiation Oncology) Donnie Mesa, MD as Consulting Physician (General Surgery) Elois Averitt, Virgie Dad, MD as Consulting Physician (Oncology) Melrose Nakayama, MD as Consulting Physician (Orthopedic Surgery) Bensimhon, Shaune Pascal, MD as Consulting Physician (Cardiology) Chauncey Cruel, MD OTHER MD:  CHIEF COMPLAINT: 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   She continues on adjuvant Kadcyla/ T-DM1.  Today is the 9th of 14 planned doses.  She has generally tolerated this well.  Her most recent echocardiogram on 05/14/2020 showed an ejection fraction of 55-60%. This is only slightly lower than prior in 02/2020.  She has a repeat echocardiogram scheduled for 08/10/2020.  Of note, she is scheduled for bone density screening on 08/13/2020.   REVIEW OF SYSTEMS: Charlee generally feels well.  She does not exercise regularly foot she is very busy taking care of her 75 year old that was Parkinson's and is in a wheelchair.  She is going to have cataract surgery and does not want to have her vaccine booster until a couple of weeks after that.  Port is working well.  She has peripheral neuropathy.  Particularly involving the feet.  This is stable.  She had a sharp pain in her right index last week but that resolved without any problems.  She does have quite a bit of arthritis involving both knees.  Aside from this a detailed review of systems today was stable   COVID 19  VACCINATION STATUS: Status post [place] x1, July 2021, no booster as of 08/04/2020    HISTORY OF CURRENT ILLNESS: From the original intake note:  Julie Thompson had routine screening mammography on 06/21/2019 showing a possible abnormality in the left breast. She underwent left diagnostic mammography with tomography and left breast ultrasonography at [facility] on 07/01/2019 showing: breast density category C; either 2 adjacent masses or 1 complex dumbbell-shaped mass containing calcifications in the left breast at 6 o'clock, the largest of which measures 2.1 cm; chest wall invasion not excluded; 1-2 mm group of calcifications just anterior to the mass(es); single abnormal lymph node in the left axilla; vascular calcifications in anterior left breast.  Accordingly on 07/09/2019 she proceeded to biopsy of the left breast areas in question. The pathology from this procedure ([id number]) showed:  1. Left Breast, 5:30, posterior  - invasive ductal carcinoma with necrosis, grade 3  - Prognostic indicators significant for: estrogen receptor, 0% negative and progesterone receptor, 0% negative. Proliferation marker Ki67 at 60%. HER2 negative by immunohistochemistry (0). 2. Left Breast, 5:30, posterior  - invasive ductal carcinoma with necrosis, grade 3  - ductal carcinoma in situ, intermediate grade 3. Left Axilla, lymph node (1)  - metastatic carcinoma involving a lymph node  And additional biopsy of the anterior vascular calcifications in the left breast was performed on 07/10/2019. Pathology [id number]) showed: fibrocystic changes with calcifications; no malignancy identified.  This was felt to be concordant.  The patient's subsequent history is as detailed below.   PAST MEDICAL HISTORY: Past Medical History:  Diagnosis Date   Arthritis 2012   Asthma  no problems now   Family history of basal cell carcinoma    Family history of breast cancer    Family history of colon cancer     Family history of kidney cancer    Family history of leukemia    Family history of peritoneal cancer    GERD (gastroesophageal reflux disease)    H/O hiatal hernia    Hyperlipidemia    Hypothyroidism    IBS (irritable bowel syndrome)    lt breast ca dx'd 07/10/19    PAST SURGICAL HISTORY: Past Surgical History:  Procedure Laterality Date   ABDOMINAL HYSTERECTOMY     age 22   BREAST LUMPECTOMY WITH RADIOACTIVE SEED AND AXILLARY LYMPH NODE DISSECTION Left 01/16/2020   Procedure: LEFT BREAST LUMPECTOMY WITH RADIOACTIVE SEED AND TARGETED AXILLARY LYMPH NODE DISSECTION;  Surgeon: Donnie Mesa, MD;  Location: [facility];  Service: General;  Laterality: Left;  LMA   BREAST SURGERY     DILATION AND CURETTAGE OF UTERUS     2 miscarriages age 12 & 79   [place] N/A 10/22/2013   Procedure: FLEXIBLE SIGMOIDOSCOPY;  Surgeon: Garlan Fair, MD;  Location: [facility];  Service: Endoscopy;  Laterality: N/A;   KNEE ARTHROSCOPY Left    PORT-A-CATH REMOVAL N/A 01/16/2020   Procedure: REMOVAL PORT-A-CATH;  Surgeon: Donnie Mesa, MD;  Location: [facility];  Service: General;  Laterality: N/A;   PORTACATH PLACEMENT Right 07/29/2019   Procedure: INSERTION PORT-A-CATH WITH ULTRASOUND;  Surgeon: Donnie Mesa, MD;  Location: [facility];  Service: General;  Laterality: Right;   PORTACATH PLACEMENT N/A 02/13/2020   Procedure: INSERTION PORT-A-CATH WITH ULTRASOUND GUIDANCE;  Surgeon: Donnie Mesa, MD;  Location: [facility];  Service: General;  Laterality: N/A;   TONSILLECTOMY     age 77    FAMILY HISTORY: Family History  Problem Relation Age of Onset   Breast cancer Maternal Aunt        dx. in her 3s   Cancer Mother 13       peritoneal cancer, death certificate lists ovarian cancer   Colon cancer Paternal Uncle        dx. late 45s or early 56s   Basal cell carcinoma Sister 22       a second diagnosed in her 85s   Kidney cancer Maternal  Grandmother 71   Diabetes Paternal Grandmother    Heart Problems Paternal Grandmother    Leukemia Paternal Uncle        dx. early 57s  Patient's father is currently living at age 67 as of 08/10/19. Patient's mother died from ovarian cancer at age 74. She was diagnosed at age 37. The patient reports breast cancer in a maternal aunt, diagnosed in her 29's. The patient also noted colon cancer in a paternal uncle and kidney cancer in her maternal grandmother.  She has 1 sister.   GYNECOLOGIC HISTORY:  No LMP recorded. Patient has had a hysterectomy. Menarche: [age] years old Age at first live birth: 75 years old [place] P 2 LMP age 63 (with hysterectomy) Contraceptive: never used HRT used for 10-12 years  Hysterectomy? Yes, age 26 BSO? 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  Husband [name] is a retired Psychologist, prison and probation services. She lives at home with husband Butch. She has two children from a prior marriage. Son [name], age 48, works as a Games developer in [place], [place]. Son [name], age 31, works  as a Programme researcher, broadcasting/film/video in [place], [place]. The patient has 4 grandchildren and 4 great-grandchildren. She attends [employer].    ADVANCED DIRECTIVES: In the absence of any documentation to the contrary, the patient's spouse is their HCPOA.    HEALTH MAINTENANCE: Social History   Tobacco Use   Smoking status: Never Smoker   Smokeless tobacco: Never Used  Vaping Use   Vaping Use: Never used  Substance Use Topics   Alcohol use: Never   Drug use: No     Colonoscopy: 09/2013, with CT virtual 11/2013  PAP: none on file, s/p hysterectomy  Bone density: 04/2001, -0.5   Allergies  Allergen Reactions   Penicillins Hives    Whelps    Sulfa Antibiotics Rash    Current Outpatient Medications  Medication Sig Dispense Refill   acetaminophen (TYLENOL) 650 MG CR tablet Take 650 mg by mouth every 8 (eight)  hours as needed for pain.     aspirin EC 81 MG tablet Take 81 mg by mouth daily.     atorvastatin (LIPITOR) 20 MG tablet Take 20 mg by mouth daily.     calcium citrate-vitamin D (CITRACAL+D) 315-200 MG-UNIT tablet Take 1 tablet by mouth 2 (two) times daily.     Cholecalciferol (VITAMIN D) 2000 UNITS tablet Take 2,000 Units by mouth daily.     co-enzyme Q-10 30 MG capsule Take 30 mg by mouth 3 (three) times daily.      esomeprazole (NEXIUM) 20 MG capsule Take 20 mg by mouth daily at 12 noon.     famotidine (PEPCID) 20 MG tablet Take 20 mg by mouth daily as needed for heartburn or indigestion.      FIBER PO Take 1 tablet by mouth daily.     gabapentin (NEURONTIN) 100 MG capsule Take 3 capsules (300 mg total) by mouth at bedtime. 90 capsule 4   levothyroxine (SYNTHROID) 50 MCG tablet Take 50 mcg by mouth daily before breakfast.      lidocaine-prilocaine (EMLA) cream Apply 1 application topically as needed. 30 g 0   loratadine (CLARITIN) 10 MG tablet Take 1 tablet (10 mg total) by mouth daily. 60 tablet 1   Multiple Vitamin (MULTIVITAMIN WITH MINERALS) TABS tablet Take 1 tablet by mouth daily.     nabumetone (RELAFEN) 500 MG tablet Take 500 mg by mouth 2 (two) times daily.     Omega-3 Fatty Acids (FISH OIL) 1200 MG CAPS Take 1,200 mg by mouth daily.     spironolactone (ALDACTONE) 25 MG tablet Take 0.5 tablets (12.5 mg total) by mouth daily. 15 tablet 3   No current facility-administered medications for this visit.    OBJECTIVE: White woman who appears stated age 40:   08/04/20 1202  BP: 133/60  Pulse: 82  Resp: 16  Temp: 97.7 F (36.5 C)  SpO2: 98%     Body mass index is 34.94 kg/m.   Wt Readings from Last 3 Encounters:  08/04/20 178 lb 14.4 oz (81.1 kg)  07/14/20 178 lb (80.7 kg)  06/23/20 177 lb 11.2 oz (80.6 kg)  ECOG FS:1 - Symptomatic but completely ambulatory  Sclerae unicteric, EOMs intact Wearing a mask No cervical or supraclavicular adenopathy Lungs  no rales or rhonchi Heart regular rate and rhythm Abd soft, nontender, positive bowel sounds MSK no focal spinal tenderness, no upper extremity lymphedema Neuro: nonfocal, well oriented, appropriate affect Breasts: Deferred  LAB RESULTS:  CMP     Component Value Date/Time   NA 140 07/14/2020 1358   K  4.2 07/14/2020 1358   CL 108 07/14/2020 1358   CO2 26 07/14/2020 1358   GLUCOSE 95 07/14/2020 1358   BUN 14 07/14/2020 1358   CREATININE 0.77 07/14/2020 1358   CREATININE 0.94 07/17/2019 0823   CALCIUM 9.0 07/14/2020 1358   PROT 6.8 07/14/2020 1358   ALBUMIN 3.5 07/14/2020 1358   AST 27 07/14/2020 1358   AST 21 07/17/2019 0823   ALT 20 07/14/2020 1358   ALT 18 07/17/2019 0823   ALKPHOS 120 07/14/2020 1358   BILITOT 0.4 07/14/2020 1358   BILITOT 0.4 07/17/2019 0823   GFRNONAA >60 07/14/2020 1358   GFRNONAA >60 07/17/2019 0823   GFRAA >60 03/30/2020 0910   GFRAA >60 07/17/2019 0823    No results found for: TOTALPROTELP, ALBUMINELP, A1GS, A2GS, BETS, BETA2SER, GAMS, MSPIKE, SPEI  Lab Results  Component Value Date   WBC 4.6 07/14/2020   NEUTROABS 2.9 07/14/2020   HGB 11.5 (L) 07/14/2020   HCT 37.0 07/14/2020   MCV 96.1 07/14/2020   PLT 105 (L) 07/14/2020    No results found for: LABCA2  No components found for: TDVVOH607  No results for input(s): INR in the last 168 hours.  No results found for: LABCA2  No results found for: PXT062  No results found for: IRS854  No results found for: OEV035  No results found for: CA2729  No components found for: HGQUANT  No results found for: CEA1 / No results found for: CEA1   No results found for: AFPTUMOR  No results found for: CHROMOGRNA  No results found for: KPAFRELGTCHN, LAMBDASER, KAPLAMBRATIO (kappa/lambda light chains)  No results found for: HGBA, HGBA2QUANT, HGBFQUANT, HGBSQUAN (Hemoglobinopathy evaluation)   No results found for: LDH  No results found for: IRON, TIBC, IRONPCTSAT (Iron and  TIBC)  No results found for: FERRITIN  Urinalysis No results found for: COLORURINE, APPEARANCEUR, LABSPEC, PHURINE, GLUCOSEU, HGBUR, BILIRUBINUR, KETONESUR, PROTEINUR, UROBILINOGEN, NITRITE, LEUKOCYTESUR   STUDIES: No results found.   ELIGIBLE FOR AVAILABLE RESEARCH PROTOCOL: no  ASSESSMENT: 75 y.o. [place] woman status post left breast lower outer quadrant biopsy 07/09/2019 for a clinical T2 N1, stage IIIB invasive ductal carcinoma, grade 3, triple negative, with an MIB-1 of 60%  (a) chest CT scan and bone scan 07/31/2019 showed no evidence of metastatic disease  (b) MRI biopsy of 2 additional areas in the left breast (central, 07/10/2019, and at 8:30 o'clock on 07/26/2019) are both benign and concordant  (c) biopsy of 2 suspicious areas in the right breast on 07/26/2019 (9:00 and lower outer quadrant) were benign and concordant  (d) PET scan 11/21/2019 shows a 1.2 cm nodule in the left breast with an SUV max of 2.7, aortic calcification, but no lung liver or bone lesions, no evidence of metastases   (1) neoadjuvant chemotherapy consisting of cyclophosphamide and doxorubicin in dose dense fashion x4 started 07/30/2019, completed 09/10/2019, followed by weekly carboplatin and paclitaxel x12 started 09/24/2019  (a) cycle 6 carboplatin/paclitaxel delayed 2 weeks because of neutropenia: granix added  (b) cycle 12 of carboplatin/paclitaxel omitted secondary to persistent low counts  (2) left lumpectomy with targeted axillary lymph node dissection 01/16/2020 showed a ypTIc ypN1 invasive ductal carcinoma, grade 3  (a) a single axillary lymph node was removed  (b) repeat prognostic panel on the surgical specimen showed the tumor to be estrogen and progesterone receptor negative, MIB-110%, and HER-2 positive with a signals ratio of 2.97 and a copy number per cell 4.90.  (3) adjuvant radiation completed 04/01/2020.  (4) genetics testing  07/23/2019 through the STAT Breast cancer panel offered  by [facility] found no deleterious mutations in ATM, BRCA1, BRCA2, CDH1, CHEK2, PALB2, PTEN, STK11 and TP53.  Additional testing through the Common Hereditary Cancers Panel offered by [facility] also found no deleterious mutations in IPC, ATM, AXIN2, BARD1, BMPR1A, BRCA1, BRCA2, BRIP1, CDH1, CDK4, CDKN2A (p14ARF), CDKN2A (p16INK4a), CHEK2, CTNNA1, DICER1, EPCAM (Deletion/duplication testing only), GREM1 (promoter region deletion/duplication testing only), KIT, MEN1, MLH1, MSH2, MSH3, MSH6, MUTYH, NBN, NF1, NHTL1, PALB2, PDGFRA, PMS2, POLD1, POLE, PTEN, RAD50, RAD51C, RAD51D, RNF43, SDHB, SDHC, SDHD, SMAD4, SMARCA4. STK11, TP53, TSC1, TSC2, and VHL.  The following genes were evaluated for sequence changes only: SDHA and HOXB13 c.251G>A variant only.   (a) 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   (5) adjuvant T-DM1/ Kadcyla started 02/18/2020, to be repeated Q3W x 14  (a) echo 02/12/2020 shows an ejection fraction in the 60-65% range  (b) echo 05/14/2020 she was then ejection fraction in the 55-60% range  (c) echo 08/10/2018   PLAN: Maleeah is doing very well with the T-DM1 and she will receive her ninth dose today.  She has 5 more to go.  I have encouraged her to walk as much as she can.  The neuropathy she has is going to be due to the prior to paclitaxel treatments.  This is not progressive.  Unfortunately it is also not getting any better  She will have her Covid booster shot after she recovers from her cataract surgery.  She is already due for echo next week.  She will see [place] again in 6 weeks.  She knows to call for any other issue that may develop before then  Total encounter time 5 minutes.Sarajane Jews C. Emylee Decelle, MD 08/04/20 12:07 PM Medical Oncology and Hematology [facility] [place], [place] [address] Tel. [phone]    Fax. [phone]   I, Wilburn Mylar, am acting as scribe for Dr. Virgie Dad. Ellyssa Zagal.  I,  Lurline Del MD, have reviewed the above documentation for accuracy and completeness, and I agree with the above.   *Total Encounter Time as defined by the Centers for Medicare and Medicaid Services includes, in addition to the face-to-face time of a patient visit (documented in the note above) non-face-to-face time: obtaining and reviewing outside history, ordering and reviewing medications, tests or procedures, care coordination (communications with other health care professionals or caregivers) and documentation in the medical record.

## 2020-08-04 ENCOUNTER — Other Ambulatory Visit: Payer: Self-pay

## 2020-08-04 ENCOUNTER — Inpatient Hospital Stay: Payer: Medicare Other

## 2020-08-04 ENCOUNTER — Telehealth: Payer: Self-pay | Admitting: *Deleted

## 2020-08-04 ENCOUNTER — Inpatient Hospital Stay: Payer: Medicare Other | Admitting: Oncology

## 2020-08-04 ENCOUNTER — Inpatient Hospital Stay: Payer: Medicare Other | Attending: Oncology

## 2020-08-04 VITALS — BP 133/60 | HR 82 | Temp 97.7°F | Resp 16 | Ht 60.0 in | Wt 178.9 lb

## 2020-08-04 DIAGNOSIS — E109 Type 1 diabetes mellitus without complications: Secondary | ICD-10-CM | POA: Diagnosis not present

## 2020-08-04 DIAGNOSIS — C50512 Malignant neoplasm of lower-outer quadrant of left female breast: Secondary | ICD-10-CM | POA: Insufficient documentation

## 2020-08-04 DIAGNOSIS — Z8051 Family history of malignant neoplasm of kidney: Secondary | ICD-10-CM | POA: Diagnosis not present

## 2020-08-04 DIAGNOSIS — Z803 Family history of malignant neoplasm of breast: Secondary | ICD-10-CM | POA: Diagnosis not present

## 2020-08-04 DIAGNOSIS — G620 Drug-induced polyneuropathy: Secondary | ICD-10-CM | POA: Diagnosis not present

## 2020-08-04 DIAGNOSIS — C773 Secondary and unspecified malignant neoplasm of axilla and upper limb lymph nodes: Secondary | ICD-10-CM | POA: Diagnosis not present

## 2020-08-04 DIAGNOSIS — Z7982 Long term (current) use of aspirin: Secondary | ICD-10-CM | POA: Diagnosis not present

## 2020-08-04 DIAGNOSIS — Z923 Personal history of irradiation: Secondary | ICD-10-CM | POA: Insufficient documentation

## 2020-08-04 DIAGNOSIS — Z5112 Encounter for antineoplastic immunotherapy: Secondary | ICD-10-CM | POA: Diagnosis not present

## 2020-08-04 DIAGNOSIS — Z8041 Family history of malignant neoplasm of ovary: Secondary | ICD-10-CM | POA: Diagnosis not present

## 2020-08-04 DIAGNOSIS — Z8349 Family history of other endocrine, nutritional and metabolic diseases: Secondary | ICD-10-CM | POA: Diagnosis not present

## 2020-08-04 DIAGNOSIS — Z171 Estrogen receptor negative status [ER-]: Secondary | ICD-10-CM

## 2020-08-04 DIAGNOSIS — Z806 Family history of leukemia: Secondary | ICD-10-CM | POA: Diagnosis not present

## 2020-08-04 DIAGNOSIS — G629 Polyneuropathy, unspecified: Secondary | ICD-10-CM | POA: Diagnosis not present

## 2020-08-04 DIAGNOSIS — Z833 Family history of diabetes mellitus: Secondary | ICD-10-CM | POA: Diagnosis not present

## 2020-08-04 DIAGNOSIS — Z79899 Other long term (current) drug therapy: Secondary | ICD-10-CM | POA: Insufficient documentation

## 2020-08-04 DIAGNOSIS — J45909 Unspecified asthma, uncomplicated: Secondary | ICD-10-CM | POA: Diagnosis not present

## 2020-08-04 DIAGNOSIS — T451X5A Adverse effect of antineoplastic and immunosuppressive drugs, initial encounter: Secondary | ICD-10-CM | POA: Diagnosis not present

## 2020-08-04 LAB — CBC WITH DIFFERENTIAL/PLATELET
Abs Immature Granulocytes: 0.01 10*3/uL (ref 0.00–0.07)
Basophils Absolute: 0 10*3/uL (ref 0.0–0.1)
Basophils Relative: 1 %
Eosinophils Absolute: 0.1 10*3/uL (ref 0.0–0.5)
Eosinophils Relative: 3 %
HCT: 36.4 % (ref 36.0–46.0)
Hemoglobin: 11.6 g/dL — ABNORMAL LOW (ref 12.0–15.0)
Immature Granulocytes: 0 %
Lymphocytes Relative: 22 %
Lymphs Abs: 1 10*3/uL (ref 0.7–4.0)
MCH: 30.4 pg (ref 26.0–34.0)
MCHC: 31.9 g/dL (ref 30.0–36.0)
MCV: 95.5 fL (ref 80.0–100.0)
Monocytes Absolute: 0.5 10*3/uL (ref 0.1–1.0)
Monocytes Relative: 10 %
Neutro Abs: 3 10*3/uL (ref 1.7–7.7)
Neutrophils Relative %: 64 %
Platelets: 91 10*3/uL — ABNORMAL LOW (ref 150–400)
RBC: 3.81 MIL/uL — ABNORMAL LOW (ref 3.87–5.11)
RDW: 16.9 % — ABNORMAL HIGH (ref 11.5–15.5)
WBC: 4.6 10*3/uL (ref 4.0–10.5)
nRBC: 0 % (ref 0.0–0.2)

## 2020-08-04 LAB — COMPREHENSIVE METABOLIC PANEL
ALT: 21 U/L (ref 0–44)
AST: 30 U/L (ref 15–41)
Albumin: 3.4 g/dL — ABNORMAL LOW (ref 3.5–5.0)
Alkaline Phosphatase: 121 U/L (ref 38–126)
Anion gap: 7 (ref 5–15)
BUN: 15 mg/dL (ref 8–23)
CO2: 27 mmol/L (ref 22–32)
Calcium: 9.2 mg/dL (ref 8.9–10.3)
Chloride: 108 mmol/L (ref 98–111)
Creatinine, Ser: 0.8 mg/dL (ref 0.44–1.00)
GFR, Estimated: >60 mL/min (ref >60)
Glucose, Bld: 99 mg/dL (ref 70–99)
Potassium: 4.2 mmol/L (ref 3.5–5.1)
Sodium: 142 mmol/L (ref 135–145)
Total Bilirubin: 0.5 mg/dL (ref 0.3–1.2)
Total Protein: 6.5 g/dL (ref 6.5–8.1)

## 2020-08-04 MED ORDER — DIPHENHYDRAMINE HCL 25 MG PO CAPS
25.0000 mg | ORAL_CAPSULE | Freq: Once | ORAL | Status: AC
  Administered 2020-08-04: 25 mg via ORAL

## 2020-08-04 MED ORDER — ACETAMINOPHEN 325 MG PO TABS
ORAL_TABLET | ORAL | Status: AC
  Filled 2020-08-04: qty 2

## 2020-08-04 MED ORDER — HEPARIN SOD (PORK) LOCK FLUSH 100 UNIT/ML IV SOLN
500.0000 [IU] | Freq: Once | INTRAVENOUS | Status: AC | PRN
  Administered 2020-08-04: 500 [IU]
  Filled 2020-08-04: qty 5

## 2020-08-04 MED ORDER — SODIUM CHLORIDE 0.9 % IV SOLN
Freq: Once | INTRAVENOUS | Status: AC
  Filled 2020-08-04: qty 250

## 2020-08-04 MED ORDER — SODIUM CHLORIDE 0.9 % IV SOLN
3.6000 mg/kg | Freq: Once | INTRAVENOUS | Status: AC
  Administered 2020-08-04: 300 mg via INTRAVENOUS
  Filled 2020-08-04: qty 15

## 2020-08-04 MED ORDER — SODIUM CHLORIDE 0.9% FLUSH
10.0000 mL | INTRAVENOUS | Status: DC | PRN
  Administered 2020-08-04: 10 mL
  Filled 2020-08-04: qty 10

## 2020-08-04 MED ORDER — DIPHENHYDRAMINE HCL 25 MG PO CAPS
ORAL_CAPSULE | ORAL | Status: AC
  Filled 2020-08-04: qty 1

## 2020-08-04 MED ORDER — ACETAMINOPHEN 325 MG PO TABS
650.0000 mg | ORAL_TABLET | Freq: Once | ORAL | Status: AC
  Administered 2020-08-04: 650 mg via ORAL

## 2020-08-04 NOTE — Telephone Encounter (Signed)
Per MD - proceed with treatment with noted platelets of 91,000.

## 2020-08-04 NOTE — Patient Instructions (Signed)
[facility] Discharge Instructions for Patients Receiving Chemotherapy  Today you received the following chemotherapy agents Kadcyla  To help prevent nausea and vomiting after your treatment, we encourage you to take your nausea medication as directed   If you develop nausea and vomiting that is not controlled by your nausea medication, call the clinic.   BELOW ARE SYMPTOMS THAT SHOULD BE REPORTED IMMEDIATELY:  *FEVER GREATER THAN 100.5 F  *CHILLS WITH OR WITHOUT FEVER  NAUSEA AND VOMITING THAT IS NOT CONTROLLED WITH YOUR NAUSEA MEDICATION  *UNUSUAL SHORTNESS OF BREATH  *UNUSUAL BRUISING OR BLEEDING  TENDERNESS IN MOUTH AND THROAT WITH OR WITHOUT PRESENCE OF ULCERS  *URINARY PROBLEMS  *BOWEL PROBLEMS  UNUSUAL RASH Items with * indicate a potential emergency and should be followed up as soon as possible.  Feel free to call the clinic should you have any questions or concerns. The clinic phone number is [phone].  Please show the CHEMO ALERT CARD at check-in to the Emergency Department and triage nurse.   

## 2020-08-06 ENCOUNTER — Telehealth: Payer: Self-pay | Admitting: Nurse Practitioner

## 2020-08-06 NOTE — Telephone Encounter (Signed)
Scheduled appts per 2/1 los. Pt confirmed appt dates and times.

## 2020-08-08 NOTE — Progress Notes (Signed)
CARDIO-ONCOLOGY CLINIC NOTE  Referring Physician: Dr. Antoine Primas, MD as PCP - General (Internal Medicine)  HPI:  Julie Thompson is 75 y.o. female with left breast cancer referred by Dr. Jana Hakim for enrollment into the Cardio-Oncology program.  Has h/o hyperlipidemia, HTN and hypothyroidism. No known cardiac disease   Oncology history:   (1) status post left breast lower outer quadrant biopsy 07/09/2019 for a clinical T2 N1, stage IIIB invasive ductal carcinoma, grade 3, triple negative, with an MIB-1 of 60%             (a) chest CT scan and bone scan 07/31/2019 showed no evidence of metastatic disease             (b) PET scan 11/21/2019 shows a 1.2 cm nodule in the left breast with an SUV max of 2.7, aortic calcification, but no lung liver or bone lesions, no evidence of metastases  (2) treated with neoadjuvant chemotherapy consisting of cyclophosphamide and doxorubicin in dose dense fashion x4 started 07/30/2019, completed 09/10/2019, followed by weekly carboplatin and paclitaxel x12 started 09/24/2019             (a) cycle 6 carboplatin/paclitaxel delayed 2 weeks because of neutropenia: granix added             (b) cycle 12 of carboplatin/paclitaxel omitted secondary to persistent low counts  (3) left lumpectomy with targeted axillary lymph node dissection 01/16/2020 showed a ypTIc ypN1 invasive ductal carcinoma, grade 3             (a) a single axillary lymph node was removed             (b) repeat prognostic panel on the surgical specimen showed the tumor to be estrogen and progesterone receptor negative, MIB-110%, and HER-2 positive with a signals ratio of 2.97 and a copy number per cell 4.90.  (4) s/p XRT  (5) T-DM1/ Kadcyla started 02/18/2020, to be repeated Q3W x 14 weeks --> Finishes in June.            Here for routine f/u. Tolerating therapy well every 3 weeks. Rarely short of breath. Main complaint has been neuropathy.  Limited by knee pain. No edema,  orthopnea or PND. She continues to take care of her 54 year old father.   Echo 08/2020 EF 60-65% mild AI GLS inaccurate due to poor windows.  Echo 05/14/20 EF 60-65% GLS -20.2% Personally reviewed Echo 02/12/20 EF 60-65% Grade I DD mild AoV thickening GLS -22.7% ECHO: 1/21 (pre-chemo):  EF 60-65% Grade I DD    Past Medical History:  Diagnosis Date  . Arthritis 2012  . Asthma    no problems now  . Family history of basal cell carcinoma   . Family history of breast cancer   . Family history of colon cancer   . Family history of kidney cancer   . Family history of leukemia   . Family history of peritoneal cancer   . GERD (gastroesophageal reflux disease)   . H/O hiatal hernia   . Hyperlipidemia   . Hypothyroidism   . IBS (irritable bowel syndrome)   . lt breast ca dx'd 07/10/19    Current Outpatient Medications  Medication Sig Dispense Refill  . acetaminophen (TYLENOL) 650 MG CR tablet Take 650 mg by mouth every 8 (eight) hours as needed for pain.    Marland Kitchen aspirin EC 81 MG tablet Take 81 mg by mouth daily.    Marland Kitchen atorvastatin (LIPITOR) 20 MG tablet Take 20  mg by mouth daily.    . calcium citrate-vitamin D (CITRACAL+D) 315-200 MG-UNIT tablet Take 1 tablet by mouth 2 (two) times daily.    . Cholecalciferol (VITAMIN D) 2000 UNITS tablet Take 2,000 Units by mouth daily.    Marland Kitchen co-enzyme Q-10 30 MG capsule Take 30 mg by mouth 3 (three) times daily.     Marland Kitchen esomeprazole (NEXIUM) 20 MG capsule Take 20 mg by mouth daily at 12 noon.    . famotidine (PEPCID) 20 MG tablet Take 20 mg by mouth daily as needed for heartburn or indigestion.     . ferrous sulfate 324 MG TBEC Take 324 mg by mouth daily with breakfast.    . FIBER PO Take 1 tablet by mouth daily.    Marland Kitchen gabapentin (NEURONTIN) 100 MG capsule Take 3 capsules (300 mg total) by mouth at bedtime. 90 capsule 4  . levothyroxine (SYNTHROID) 88 MCG tablet Take 88 mcg by mouth daily before breakfast.    . lidocaine-prilocaine (EMLA) cream Apply 1  application topically as needed. 30 g 0  . loratadine (CLARITIN) 10 MG tablet Take 1 tablet (10 mg total) by mouth daily. 60 tablet 1  . Multiple Vitamin (MULTIVITAMIN WITH MINERALS) TABS tablet Take 1 tablet by mouth daily.    . nabumetone (RELAFEN) 500 MG tablet Take 500 mg by mouth 2 (two) times daily.    . Omega-3 Fatty Acids (FISH OIL) 1200 MG CAPS Take 1,200 mg by mouth daily.    Marland Kitchen spironolactone (ALDACTONE) 25 MG tablet Take 0.5 tablets (12.5 mg total) by mouth daily. 15 tablet 3   No current facility-administered medications for this encounter.    Allergies  Allergen Reactions  . Penicillins Hives    Whelps   . Sulfa Antibiotics Rash      Social History   Socioeconomic History  . Marital status: Married    Spouse name: Not on file  . Number of children: Not on file  . Years of education: Not on file  . Highest education level: Not on file  Occupational History  . Not on file  Tobacco Use  . Smoking status: Never Smoker  . Smokeless tobacco: Never Used  Vaping Use  . Vaping Use: Never used  Substance and Sexual Activity  . Alcohol use: Never  . Drug use: No  . Sexual activity: Not Currently    Birth control/protection: Surgical  Other Topics Concern  . Not on file  Social History Narrative  . Not on file   Social Determinants of Health   Financial Resource Strain: Not on file  Food Insecurity: Not on file  Transportation Needs: Not on file  Physical Activity: Not on file  Stress: Not on file  Social Connections: Not on file  Intimate Partner Violence: Not on file      Family History  Problem Relation Age of Onset  . Breast cancer Maternal Aunt        dx. in her 50s  . Cancer Mother 17       peritoneal cancer, death certificate lists ovarian cancer  . Colon cancer Paternal Uncle        dx. late 18s or early 45s  . Basal cell carcinoma Sister 61       a second diagnosed in her 34s  . Kidney cancer Maternal Grandmother 90  . Diabetes Paternal  Grandmother   . Heart Problems Paternal Grandmother   . Leukemia Paternal Uncle        dx. early 30s  Vitals:   08/10/20 0909  BP: 130/90  Pulse: 74  SpO2: 95%  Weight: 80.6 kg (177 lb 9.6 oz)    PHYSICAL EXAM: General:  Well appearing. No resp difficulty HEENT: normal Neck: supple. no JVD. Carotids 2+ bilat; no bruits. No lymphadenopathy or thryomegaly appreciated. Cor: PMI nondisplaced. Regular rate & rhythm. No rubs, gallops or murmurs. Lungs: clear Abdomen: soft, nontender, nondistended. No hepatosplenomegaly. No bruits or masses. Good bowel sounds. Extremities: no cyanosis, clubbing, rash, edema Neuro: alert & orientedx3, cranial nerves grossly intact. moves all 4 extremities w/o difficulty. Affect pleasant   ASSESSMENT & PLAN: 1. Left Breast Cancer - initial biopsy triple negative - treated 1/21 with 4 doses of doxorubicin-based regimen - s/p left lumpectomy 7/21 with positive margins which were HER-2+ - s/p XRT - Now on TDM-1 based therpy - Echo 8/21 EF 60-65% Grade I DD mild AoV thickening GLS -22.7% - Echo 11/21 EF 60-65% GLS -20.2% - I reviewed echos personally. EF and Doppler parameters stable. No HF on exam. Continue Herceptin.   2. HTN -Controlled.   Follow up in 3-4 months.   Darrick Grinder, NP  9:13 AM  Patient seen and examined with the above-signed Advanced Practice Provider and/or Housestaff. I personally reviewed laboratory data, imaging studies and relevant notes. I independently examined the patient and formulated the important aspects of the plan. I have edited the note to reflect any of my changes or salient points. I have personally discussed the plan with the patient and/or family.  Doing very well. No issues with TDM-1 therapy. Echo reviewed personally today and EF 60-65%. No HF symptoms. BP well controlled.   General:  Well appearing. No resp difficulty HEENT: normal Neck: supple. no JVD. Carotids 2+ bilat; no bruits. No lymphadenopathy or  thryomegaly appreciated. Cor: PMI nondisplaced. Regular rate & rhythm. No rubs, gallops or murmurs. Lungs: clear Abdomen: soft, nontender, nondistended. No hepatosplenomegaly. No bruits or masses. Good bowel sounds. Extremities: no cyanosis, clubbing, rash, edema Neuro: alert & orientedx3, cranial nerves grossly intact. moves all 4 extremities w/o difficulty. Affect pleasant  Doing well. Echo reviewed personally. No evidence of chemo-induced cardiotoxicity. No HF on exam. Continue Herceptin. Repeat echo in 3 months.   Glori Bickers, MD  1:36 PM

## 2020-08-10 ENCOUNTER — Ambulatory Visit (HOSPITAL_BASED_OUTPATIENT_CLINIC_OR_DEPARTMENT_OTHER)
Admission: RE | Admit: 2020-08-10 | Discharge: 2020-08-10 | Disposition: A | Discharge status: Home / Self Care | Payer: Medicare Other | Source: Ambulatory Visit | Attending: Internal Medicine | Admitting: Internal Medicine

## 2020-08-10 ENCOUNTER — Ambulatory Visit (HOSPITAL_COMMUNITY)
Admission: RE | Admit: 2020-08-10 | Discharge: 2020-08-10 | Disposition: A | Discharge status: Home / Self Care | Payer: Medicare Other | Source: Ambulatory Visit | Attending: Internal Medicine | Admitting: Internal Medicine

## 2020-08-10 ENCOUNTER — Encounter (HOSPITAL_COMMUNITY): Payer: Self-pay | Admitting: Internal Medicine

## 2020-08-10 ENCOUNTER — Other Ambulatory Visit: Payer: Self-pay

## 2020-08-10 VITALS — BP 130/90 | HR 74 | Wt 177.6 lb

## 2020-08-10 DIAGNOSIS — I10 Essential (primary) hypertension: Secondary | ICD-10-CM | POA: Diagnosis not present

## 2020-08-10 DIAGNOSIS — E785 Hyperlipidemia, unspecified: Secondary | ICD-10-CM | POA: Diagnosis not present

## 2020-08-10 DIAGNOSIS — I700 Atherosclerosis of aorta: Secondary | ICD-10-CM | POA: Diagnosis not present

## 2020-08-10 DIAGNOSIS — Z171 Estrogen receptor negative status [ER-]: Secondary | ICD-10-CM | POA: Insufficient documentation

## 2020-08-10 DIAGNOSIS — I509 Heart failure, unspecified: Secondary | ICD-10-CM | POA: Insufficient documentation

## 2020-08-10 DIAGNOSIS — C50512 Malignant neoplasm of lower-outer quadrant of left female breast: Secondary | ICD-10-CM

## 2020-08-10 DIAGNOSIS — I351 Nonrheumatic aortic (valve) insufficiency: Secondary | ICD-10-CM | POA: Diagnosis not present

## 2020-08-10 DIAGNOSIS — I5043 Acute on chronic combined systolic (congestive) and diastolic (congestive) heart failure: Secondary | ICD-10-CM | POA: Diagnosis not present

## 2020-08-10 LAB — ECHOCARDIOGRAM COMPLETE: S' Lateral: 2.8 cm

## 2020-08-10 NOTE — Progress Notes (Signed)
  Echocardiogram 2D Echocardiogram has been performed.  Julie Thompson 08/10/2020, 8:54 AM

## 2020-08-10 NOTE — Addendum Note (Signed)
Encounter addended by: Jolaine Artist, MD on: 08/10/2020 1:37 PM  Actions taken: Level of Service modified, Visit diagnoses modified

## 2020-08-10 NOTE — Patient Instructions (Signed)
No Labs done today.   We will call you with your echo results from today.  No medication changes were made. Please continue all current medications as prescribed.  Your physician recommends that you schedule a follow-up appointment in: 3 months with an echo prior to your exam.  Your physician has requested that you have an echocardiogram. Echocardiography is a painless test that uses sound waves to create images of your heart. It provides your doctor with information about the size and shape of your heart and how well your heart's chambers and valves are working. This procedure takes approximately one hour. There are no restrictions for this procedure.   If you have any questions or concerns before your next appointment please send [place] a message through [employer] or call our office at [phone].    TO LEAVE A MESSAGE FOR THE NURSE SELECT OPTION 2, PLEASE LEAVE A MESSAGE INCLUDING: . YOUR NAME . DATE OF BIRTH . CALL BACK NUMBER . REASON FOR CALL**this is important as we prioritize the call backs  YOU WILL RECEIVE A CALL BACK THE SAME DAY AS LONG AS YOU CALL BEFORE 4:00 PM   Do the following things EVERYDAY: 1) Weigh yourself in the morning before breakfast. Write it down and keep it in a log. 2) Take your medicines as prescribed 3) Eat low salt foods--Limit salt (sodium) to 2000 mg per day.  4) Stay as active as you can everyday 5) Limit all fluids for the day to less than 2 liters   At the [facility], you and your health needs are our priority. As part of our continuing mission to provide you with exceptional heart care, we have created designated Provider Care Teams. These Care Teams include your primary Cardiologist (physician) and Advanced Practice Providers (APPs- Physician Assistants and Nurse Practitioners) who all work together to provide you with the care you need, when you need it.   You may see any of the following providers on your designated Care Team at  your next follow up: Marland Kitchen Dr Glori Bickers . Dr Loralie Champagne . Darrick Grinder, NP . Lyda Jester, PA . Audry Riles, PharmD   Please be sure to bring in all your medications bottles to every appointment.

## 2020-08-11 DIAGNOSIS — H18413 Arcus senilis, bilateral: Secondary | ICD-10-CM | POA: Diagnosis not present

## 2020-08-11 DIAGNOSIS — H25043 Posterior subcapsular polar age-related cataract, bilateral: Secondary | ICD-10-CM | POA: Diagnosis not present

## 2020-08-11 DIAGNOSIS — H2513 Age-related nuclear cataract, bilateral: Secondary | ICD-10-CM | POA: Diagnosis not present

## 2020-08-11 DIAGNOSIS — H25013 Cortical age-related cataract, bilateral: Secondary | ICD-10-CM | POA: Diagnosis not present

## 2020-08-13 ENCOUNTER — Other Ambulatory Visit: Payer: Medicare Other

## 2020-08-21 DIAGNOSIS — M170 Bilateral primary osteoarthritis of knee: Secondary | ICD-10-CM | POA: Diagnosis not present

## 2020-08-21 DIAGNOSIS — I10 Essential (primary) hypertension: Secondary | ICD-10-CM | POA: Diagnosis not present

## 2020-08-21 DIAGNOSIS — E039 Hypothyroidism, unspecified: Secondary | ICD-10-CM | POA: Diagnosis not present

## 2020-08-21 DIAGNOSIS — E7800 Pure hypercholesterolemia, unspecified: Secondary | ICD-10-CM | POA: Diagnosis not present

## 2020-08-24 ENCOUNTER — Encounter: Payer: Self-pay | Admitting: Genetic Counselor

## 2020-08-24 DIAGNOSIS — H2511 Age-related nuclear cataract, right eye: Secondary | ICD-10-CM | POA: Diagnosis not present

## 2020-08-25 ENCOUNTER — Inpatient Hospital Stay: Payer: Medicare Other

## 2020-08-25 ENCOUNTER — Other Ambulatory Visit: Payer: Self-pay

## 2020-08-25 ENCOUNTER — Encounter: Payer: Self-pay | Admitting: *Deleted

## 2020-08-25 VITALS — BP 146/62 | HR 68 | Temp 98.0°F | Resp 17

## 2020-08-25 DIAGNOSIS — Z8349 Family history of other endocrine, nutritional and metabolic diseases: Secondary | ICD-10-CM | POA: Diagnosis not present

## 2020-08-25 DIAGNOSIS — Z171 Estrogen receptor negative status [ER-]: Secondary | ICD-10-CM

## 2020-08-25 DIAGNOSIS — C50512 Malignant neoplasm of lower-outer quadrant of left female breast: Secondary | ICD-10-CM

## 2020-08-25 DIAGNOSIS — Z833 Family history of diabetes mellitus: Secondary | ICD-10-CM | POA: Diagnosis not present

## 2020-08-25 DIAGNOSIS — E109 Type 1 diabetes mellitus without complications: Secondary | ICD-10-CM | POA: Diagnosis not present

## 2020-08-25 DIAGNOSIS — Z923 Personal history of irradiation: Secondary | ICD-10-CM | POA: Diagnosis not present

## 2020-08-25 DIAGNOSIS — G629 Polyneuropathy, unspecified: Secondary | ICD-10-CM | POA: Diagnosis not present

## 2020-08-25 DIAGNOSIS — Z8051 Family history of malignant neoplasm of kidney: Secondary | ICD-10-CM | POA: Diagnosis not present

## 2020-08-25 DIAGNOSIS — Z7982 Long term (current) use of aspirin: Secondary | ICD-10-CM | POA: Diagnosis not present

## 2020-08-25 DIAGNOSIS — Z803 Family history of malignant neoplasm of breast: Secondary | ICD-10-CM | POA: Diagnosis not present

## 2020-08-25 DIAGNOSIS — C773 Secondary and unspecified malignant neoplasm of axilla and upper limb lymph nodes: Secondary | ICD-10-CM | POA: Diagnosis not present

## 2020-08-25 DIAGNOSIS — H2512 Age-related nuclear cataract, left eye: Secondary | ICD-10-CM | POA: Diagnosis not present

## 2020-08-25 DIAGNOSIS — Z806 Family history of leukemia: Secondary | ICD-10-CM | POA: Diagnosis not present

## 2020-08-25 DIAGNOSIS — Z5112 Encounter for antineoplastic immunotherapy: Secondary | ICD-10-CM | POA: Diagnosis not present

## 2020-08-25 DIAGNOSIS — Z79899 Other long term (current) drug therapy: Secondary | ICD-10-CM | POA: Diagnosis not present

## 2020-08-25 DIAGNOSIS — Z95828 Presence of other vascular implants and grafts: Secondary | ICD-10-CM

## 2020-08-25 DIAGNOSIS — Z8041 Family history of malignant neoplasm of ovary: Secondary | ICD-10-CM | POA: Diagnosis not present

## 2020-08-25 DIAGNOSIS — J45909 Unspecified asthma, uncomplicated: Secondary | ICD-10-CM | POA: Diagnosis not present

## 2020-08-25 LAB — COMPREHENSIVE METABOLIC PANEL
ALT: 26 U/L (ref 0–44)
AST: 36 U/L (ref 15–41)
Albumin: 3.5 g/dL (ref 3.5–5.0)
Alkaline Phosphatase: 139 U/L — ABNORMAL HIGH (ref 38–126)
Anion gap: 8 (ref 5–15)
BUN: 16 mg/dL (ref 8–23)
CO2: 26 mmol/L (ref 22–32)
Calcium: 9.1 mg/dL (ref 8.9–10.3)
Chloride: 108 mmol/L (ref 98–111)
Creatinine, Ser: 0.85 mg/dL (ref 0.44–1.00)
GFR, Estimated: >60 mL/min (ref >60)
Glucose, Bld: 132 mg/dL — ABNORMAL HIGH (ref 70–99)
Potassium: 3.8 mmol/L (ref 3.5–5.1)
Sodium: 142 mmol/L (ref 135–145)
Total Bilirubin: 0.6 mg/dL (ref 0.3–1.2)
Total Protein: 6.9 g/dL (ref 6.5–8.1)

## 2020-08-25 LAB — CBC WITH DIFFERENTIAL/PLATELET
Abs Immature Granulocytes: 0.02 10*3/uL (ref 0.00–0.07)
Basophils Absolute: 0 10*3/uL (ref 0.0–0.1)
Basophils Relative: 1 %
Eosinophils Absolute: 0.1 10*3/uL (ref 0.0–0.5)
Eosinophils Relative: 2 %
HCT: 37.6 % (ref 36.0–46.0)
Hemoglobin: 11.8 g/dL — ABNORMAL LOW (ref 12.0–15.0)
Immature Granulocytes: 0 %
Lymphocytes Relative: 22 %
Lymphs Abs: 1.1 10*3/uL (ref 0.7–4.0)
MCH: 30.2 pg (ref 26.0–34.0)
MCHC: 31.4 g/dL (ref 30.0–36.0)
MCV: 96.2 fL (ref 80.0–100.0)
Monocytes Absolute: 0.5 10*3/uL (ref 0.1–1.0)
Monocytes Relative: 10 %
Neutro Abs: 3.1 10*3/uL (ref 1.7–7.7)
Neutrophils Relative %: 65 %
Platelets: 85 10*3/uL — ABNORMAL LOW (ref 150–400)
RBC: 3.91 MIL/uL (ref 3.87–5.11)
RDW: 16.7 % — ABNORMAL HIGH (ref 11.5–15.5)
WBC: 4.8 10*3/uL (ref 4.0–10.5)
nRBC: 0 % (ref 0.0–0.2)

## 2020-08-25 MED ORDER — SODIUM CHLORIDE 0.9 % IV SOLN
3.6000 mg/kg | Freq: Once | INTRAVENOUS | Status: AC
  Administered 2020-08-25: 300 mg via INTRAVENOUS
  Filled 2020-08-25: qty 15

## 2020-08-25 MED ORDER — SODIUM CHLORIDE 0.9% FLUSH
10.0000 mL | Freq: Once | INTRAVENOUS | Status: AC
  Administered 2020-08-25: 10 mL
  Filled 2020-08-25: qty 10

## 2020-08-25 MED ORDER — SODIUM CHLORIDE 0.9 % IV SOLN
Freq: Once | INTRAVENOUS | Status: AC
  Filled 2020-08-25: qty 250

## 2020-08-25 MED ORDER — ACETAMINOPHEN 325 MG PO TABS
ORAL_TABLET | ORAL | Status: AC
  Filled 2020-08-25: qty 2

## 2020-08-25 MED ORDER — SODIUM CHLORIDE 0.9% FLUSH
10.0000 mL | INTRAVENOUS | Status: DC | PRN
  Administered 2020-08-25: 10 mL
  Filled 2020-08-25: qty 10

## 2020-08-25 MED ORDER — DIPHENHYDRAMINE HCL 25 MG PO CAPS
ORAL_CAPSULE | ORAL | Status: AC
  Filled 2020-08-25: qty 1

## 2020-08-25 MED ORDER — ACETAMINOPHEN 325 MG PO TABS
650.0000 mg | ORAL_TABLET | Freq: Once | ORAL | Status: AC
  Administered 2020-08-25: 650 mg via ORAL

## 2020-08-25 MED ORDER — HEPARIN SOD (PORK) LOCK FLUSH 100 UNIT/ML IV SOLN
500.0000 [IU] | Freq: Once | INTRAVENOUS | Status: AC | PRN
  Administered 2020-08-25: 500 [IU]
  Filled 2020-08-25: qty 5

## 2020-08-25 MED ORDER — DIPHENHYDRAMINE HCL 25 MG PO CAPS
25.0000 mg | ORAL_CAPSULE | Freq: Once | ORAL | Status: AC
  Administered 2020-08-25: 25 mg via ORAL

## 2020-08-25 NOTE — Progress Notes (Signed)
Per Dr. Jana Hakim, ok to treat with today's platelet count.

## 2020-08-25 NOTE — Patient Instructions (Signed)
[facility] Discharge Instructions for Patients Receiving Chemotherapy  Today you received the following chemotherapy agents Kadcyla  To help prevent nausea and vomiting after your treatment, we encourage you to take your nausea medication as directed   If you develop nausea and vomiting that is not controlled by your nausea medication, call the clinic.   BELOW ARE SYMPTOMS THAT SHOULD BE REPORTED IMMEDIATELY:  *FEVER GREATER THAN 100.5 F  *CHILLS WITH OR WITHOUT FEVER  NAUSEA AND VOMITING THAT IS NOT CONTROLLED WITH YOUR NAUSEA MEDICATION  *UNUSUAL SHORTNESS OF BREATH  *UNUSUAL BRUISING OR BLEEDING  TENDERNESS IN MOUTH AND THROAT WITH OR WITHOUT PRESENCE OF ULCERS  *URINARY PROBLEMS  *BOWEL PROBLEMS  UNUSUAL RASH Items with * indicate a potential emergency and should be followed up as soon as possible.  Feel free to call the clinic should you have any questions or concerns. The clinic phone number is [phone].  Please show the CHEMO ALERT CARD at check-in to the Emergency Department and triage nurse.   

## 2020-09-07 DIAGNOSIS — E039 Hypothyroidism, unspecified: Secondary | ICD-10-CM | POA: Diagnosis not present

## 2020-09-14 DIAGNOSIS — C50912 Malignant neoplasm of unspecified site of left female breast: Secondary | ICD-10-CM | POA: Diagnosis not present

## 2020-09-14 NOTE — Progress Notes (Signed)
Symptoms Management Clinic Progress Note   Julie Thompson [id number] 08-May-1946 75 y.o.  Julie Thompson is managed by Dr. Lurline Del  Actively treated with chemotherapy/immunotherapy/hormonal therapy: yes  Current therapy:  Kadcyla   Last treated: 08/25/2020 (cycle #10, day #1)  Next scheduled appointment with provider: 10/02/2020  Assessment: Plan:    Malignant neoplasm of lower-outer quadrant of left breast of female, estrogen receptor negative ([place])  Nausea without vomiting - Plan: ondansetron (ZOFRAN) injection 8 mg  Thrombocytopenia ([place])   ER negative left breast cancer: Julie Thompson presents for consideration of cycle # 11, day #1 of Kadcyla. She will return for labs at a follow-up visit prior to her next scheduled treatment on 10/02/2020 and will be seen ny Dr. Jana Hakim on 10/27/2020.  Nausea: The patient was given Zofran 8 mg IV x1.  Thrombocytopenia: CBC returned today with a platelet count of 60.  This was reviewed with Dr. Jana Hakim.  We will proceed with her treatment today with plans to transition from Kadcyla to Herceptin should her platelets count continue to drop.  She will be seen again with labs and a follow-up visit on 10/02/2020.  She was told to return earlier should she notice bleeding or increased bruising.  She expressed understanding and agreement with this plan.  Please see After Visit Summary for patient specific instructions.  Future Appointments  Date Time Provider [place]  10/06/2020 10:15 AM [facility] LAB [facility] None  10/06/2020 10:30 AM [facility] INFUSION [facility] None  10/06/2020 11:00 AM Naina Sleeper, Lucianne Lei E., PA-C [facility] None  10/06/2020 12:00 PM [facility] INFUSION [facility] None  10/27/2020 12:15 PM [facility] LAB [facility] None  10/27/2020 12:30 PM [facility] [place] FLUSH [facility] None  10/27/2020  1:00 PM Magrinat, Virgie Dad, MD [facility] None  10/27/2020  2:00 PM [facility] INFUSION [facility] None   11/11/2020  9:15 AM [facility] ECHO OP 2 [facility]  11/11/2020 10:20 AM Bensimhon, Shaune Pascal, MD [facility] None  12/21/2020  2:30 PM [facility] DX DEXA 1 [facility] GI-BREAST CE    No orders of the defined types were placed in this encounter.      Subjective:   Patient ID:  Julie Thompson is a 75 y.o. (DOB 12/17/1945) female.  Chief Complaint: No chief complaint on file.   HPI Julie Thompson  is a 75 y.o. female with a diagnosis of an ER negative left breast cancer. She is followed by Dr. Jana Hakim and presents for consideration of cycle # 11, day #1 of Kadcyla.  She reports that she is doing well with no acute issues of concern.  She denies fevers, chills, sweats, nausea, vomiting, constipation, diarrhea, epistaxis, bruising, hematuria, melena, or bright red blood per rectum.  Medications: I have reviewed the patient's current medications.  Allergies:  Allergies  Allergen Reactions  . Penicillins Hives    Whelps   . Sulfa Antibiotics Rash    Past Medical History:  Diagnosis Date  . Arthritis 2012  . Asthma    no problems now  . Family history of basal cell carcinoma   . Family history of breast cancer   . Family history of colon cancer   . Family history of kidney cancer   . Family history of leukemia   . Family history of peritoneal cancer   . GERD (gastroesophageal reflux disease)   . H/O hiatal hernia   . Hyperlipidemia   . Hypothyroidism   . IBS (irritable bowel syndrome)   . lt breast ca dx'd 07/10/19  Past Surgical History:  Procedure Laterality Date  . ABDOMINAL HYSTERECTOMY     age 39  . BREAST LUMPECTOMY WITH RADIOACTIVE SEED AND AXILLARY LYMPH NODE DISSECTION Left 01/16/2020   Procedure: LEFT BREAST LUMPECTOMY WITH RADIOACTIVE SEED AND TARGETED AXILLARY LYMPH NODE DISSECTION;  Surgeon: Donnie Mesa, MD;  Location: [place];  Service: General;  Laterality: Left;  LMA  . BREAST SURGERY    . DILATION AND CURETTAGE OF UTERUS     2 miscarriages age 62 & 89  .  FLEXIBLE SIGMOIDOSCOPY N/A 10/22/2013   Procedure: FLEXIBLE SIGMOIDOSCOPY;  Surgeon: Garlan Fair, MD;  Location: [facility];  Service: Endoscopy;  Laterality: N/A;  . KNEE ARTHROSCOPY Left   . PORT-A-CATH REMOVAL N/A 01/16/2020   Procedure: REMOVAL PORT-A-CATH;  Surgeon: Donnie Mesa, MD;  Location: [place];  Service: General;  Laterality: N/A;  . PORTACATH PLACEMENT Right 07/29/2019   Procedure: INSERTION PORT-A-CATH WITH ULTRASOUND;  Surgeon: Donnie Mesa, MD;  Location: [place];  Service: General;  Laterality: Right;  . PORTACATH PLACEMENT N/A 02/13/2020   Procedure: INSERTION PORT-A-CATH WITH ULTRASOUND GUIDANCE;  Surgeon: Donnie Mesa, MD;  Location: [place];  Service: General;  Laterality: N/A;  . TONSILLECTOMY     age 67    Family History  Problem Relation Age of Onset  . Breast cancer Maternal Aunt        dx. in her 20s  . Cancer Mother 71       peritoneal cancer, death certificate lists ovarian cancer  . Colon cancer Paternal Uncle        dx. late 50s or early 35s  . Basal cell carcinoma Sister 67       a second diagnosed in her 36s  . Kidney cancer Maternal Grandmother 70  . Diabetes Paternal Grandmother   . Heart Problems Paternal Grandmother   . Leukemia Paternal Uncle        dx. early 53s    Social History   Socioeconomic History  . Marital status: Married    Spouse name: Not on file  . Number of children: Not on file  . Years of education: Not on file  . Highest education level: Not on file  Occupational History  . Not on file  Tobacco Use  . Smoking status: Never Smoker  . Smokeless tobacco: Never Used  Vaping Use  . Vaping Use: Never used  Substance and Sexual Activity  . Alcohol use: Never  . Drug use: No  . Sexual activity: Not Currently    Birth control/protection: Surgical  Other Topics Concern  . Not on file  Social History Narrative  . Not on file   Social Determinants of Health   Financial  Resource Strain: Not on file  Food Insecurity: Not on file  Transportation Needs: Not on file  Physical Activity: Not on file  Stress: Not on file  Social Connections: Not on file  Intimate Partner Violence: Not on file    Past Medical History, Surgical history, Social history, and Family history were reviewed and updated as appropriate.   Please see review of systems for further details on the patient's review from today.   Review of Systems:  Review of Systems  Constitutional: Negative for chills, diaphoresis and fever.  HENT: Negative for trouble swallowing and voice change.   Respiratory: Negative for cough, chest tightness, shortness of breath and wheezing.   Cardiovascular: Negative for chest pain and palpitations.  Gastrointestinal: Negative for abdominal pain, constipation, diarrhea,  nausea and vomiting.  Musculoskeletal: Negative for back pain and myalgias.  Neurological: Negative for dizziness, light-headedness and headaches.    Objective:   Physical Exam:  BP (!) 144/87 (BP Location: Right Arm, Patient Position: Sitting)   Pulse 82   Temp (!) 97.4 F (36.3 C) (Tympanic)   Resp 16   Ht 5' (1.524 m)   Wt 179 lb 6.4 oz (81.4 kg)   SpO2 99%   BMI 35.04 kg/m  ECOG: 0  Physical Exam Constitutional:      General: She is not in acute distress.    Appearance: She is not diaphoretic.  HENT:     Head: Normocephalic and atraumatic.  Eyes:     General: No scleral icterus.       Right eye: No discharge.        Left eye: No discharge.     Conjunctiva/sclera: Conjunctivae normal.  Cardiovascular:     Rate and Rhythm: Normal rate and regular rhythm.     Heart sounds: Normal heart sounds. No murmur heard. No friction rub. No gallop.   Pulmonary:     Effort: Pulmonary effort is normal. No respiratory distress.     Breath sounds: Normal breath sounds. No wheezing or rales.  Skin:    General: Skin is warm and dry.     Findings: No erythema or rash.  Neurological:      Mental Status: She is alert.     Coordination: Coordination normal.     Gait: Gait normal.  Psychiatric:        Mood and Affect: Mood normal.        Behavior: Behavior normal.        Thought Content: Thought content normal.        Judgment: Judgment normal.     Lab Review:     Component Value Date/Time   NA 141 09/15/2020 1300   K 3.8 09/15/2020 1300   CL 109 09/15/2020 1300   CO2 26 09/15/2020 1300   GLUCOSE 98 09/15/2020 1300   BUN 15 09/15/2020 1300   CREATININE 0.76 09/15/2020 1300   CREATININE 0.94 07/17/2019 0823   CALCIUM 8.8 (L) 09/15/2020 1300   PROT 6.6 09/15/2020 1300   ALBUMIN 3.4 (L) 09/15/2020 1300   AST 36 09/15/2020 1300   AST 21 07/17/2019 0823   ALT 27 09/15/2020 1300   ALT 18 07/17/2019 0823   ALKPHOS 156 (H) 09/15/2020 1300   BILITOT 0.6 09/15/2020 1300   BILITOT 0.4 07/17/2019 0823   GFRNONAA >60 09/15/2020 1300   GFRNONAA >60 07/17/2019 0823   GFRAA >60 03/30/2020 0910   GFRAA >60 07/17/2019 0823       Component Value Date/Time   WBC 3.8 (L) 09/15/2020 1300   RBC 3.68 (L) 09/15/2020 1300   HGB 11.2 (L) 09/15/2020 1300   HGB 12.9 07/17/2019 0823   HCT 35.0 (L) 09/15/2020 1300   PLT 60 (L) 09/15/2020 1300   PLT 228 07/17/2019 0823   MCV 95.1 09/15/2020 1300   MCH 30.4 09/15/2020 1300   MCHC 32.0 09/15/2020 1300   RDW 16.9 (H) 09/15/2020 1300   LYMPHSABS 0.9 09/15/2020 1300   MONOABS 0.4 09/15/2020 1300   EOSABS 0.1 09/15/2020 1300   BASOSABS 0.0 09/15/2020 1300   -------------------------------  Imaging from last 24 hours (if applicable):  Radiology interpretation: No results found.      This case was discussed with Dr. Jana Hakim. He expressed his agreement with my management of this patient.

## 2020-09-15 ENCOUNTER — Inpatient Hospital Stay: Payer: Medicare Other

## 2020-09-15 ENCOUNTER — Inpatient Hospital Stay (HOSPITAL_BASED_OUTPATIENT_CLINIC_OR_DEPARTMENT_OTHER): Payer: Medicare Other | Admitting: Medical

## 2020-09-15 ENCOUNTER — Inpatient Hospital Stay: Payer: Medicare Other | Attending: Oncology

## 2020-09-15 ENCOUNTER — Other Ambulatory Visit: Payer: Self-pay

## 2020-09-15 VITALS — BP 126/67 | HR 69 | Temp 98.0°F | Resp 18

## 2020-09-15 VITALS — BP 144/87 | HR 82 | Temp 97.4°F | Resp 16 | Ht 60.0 in | Wt 179.4 lb

## 2020-09-15 DIAGNOSIS — Z806 Family history of leukemia: Secondary | ICD-10-CM | POA: Diagnosis not present

## 2020-09-15 DIAGNOSIS — C50512 Malignant neoplasm of lower-outer quadrant of left female breast: Secondary | ICD-10-CM | POA: Diagnosis not present

## 2020-09-15 DIAGNOSIS — K589 Irritable bowel syndrome without diarrhea: Secondary | ICD-10-CM | POA: Insufficient documentation

## 2020-09-15 DIAGNOSIS — Z803 Family history of malignant neoplasm of breast: Secondary | ICD-10-CM | POA: Diagnosis not present

## 2020-09-15 DIAGNOSIS — Z171 Estrogen receptor negative status [ER-]: Secondary | ICD-10-CM | POA: Insufficient documentation

## 2020-09-15 DIAGNOSIS — Z79899 Other long term (current) drug therapy: Secondary | ICD-10-CM | POA: Insufficient documentation

## 2020-09-15 DIAGNOSIS — D696 Thrombocytopenia, unspecified: Secondary | ICD-10-CM | POA: Insufficient documentation

## 2020-09-15 DIAGNOSIS — Z8049 Family history of malignant neoplasm of other genital organs: Secondary | ICD-10-CM | POA: Insufficient documentation

## 2020-09-15 DIAGNOSIS — Z5112 Encounter for antineoplastic immunotherapy: Secondary | ICD-10-CM | POA: Insufficient documentation

## 2020-09-15 DIAGNOSIS — E039 Hypothyroidism, unspecified: Secondary | ICD-10-CM | POA: Insufficient documentation

## 2020-09-15 DIAGNOSIS — R110 Nausea: Secondary | ICD-10-CM

## 2020-09-15 DIAGNOSIS — Z8051 Family history of malignant neoplasm of kidney: Secondary | ICD-10-CM | POA: Insufficient documentation

## 2020-09-15 DIAGNOSIS — Z800 Family history of malignant neoplasm of digestive organs: Secondary | ICD-10-CM | POA: Diagnosis not present

## 2020-09-15 DIAGNOSIS — J45909 Unspecified asthma, uncomplicated: Secondary | ICD-10-CM | POA: Insufficient documentation

## 2020-09-15 DIAGNOSIS — C773 Secondary and unspecified malignant neoplasm of axilla and upper limb lymph nodes: Secondary | ICD-10-CM | POA: Diagnosis not present

## 2020-09-15 DIAGNOSIS — Z95828 Presence of other vascular implants and grafts: Secondary | ICD-10-CM

## 2020-09-15 LAB — CBC WITH DIFFERENTIAL/PLATELET
Abs Immature Granulocytes: 0.01 10*3/uL (ref 0.00–0.07)
Basophils Absolute: 0 10*3/uL (ref 0.0–0.1)
Basophils Relative: 1 %
Eosinophils Absolute: 0.1 10*3/uL (ref 0.0–0.5)
Eosinophils Relative: 3 %
HCT: 35 % — ABNORMAL LOW (ref 36.0–46.0)
Hemoglobin: 11.2 g/dL — ABNORMAL LOW (ref 12.0–15.0)
Immature Granulocytes: 0 %
Lymphocytes Relative: 24 %
Lymphs Abs: 0.9 10*3/uL (ref 0.7–4.0)
MCH: 30.4 pg (ref 26.0–34.0)
MCHC: 32 g/dL (ref 30.0–36.0)
MCV: 95.1 fL (ref 80.0–100.0)
Monocytes Absolute: 0.4 10*3/uL (ref 0.1–1.0)
Monocytes Relative: 10 %
Neutro Abs: 2.4 10*3/uL (ref 1.7–7.7)
Neutrophils Relative %: 62 %
Platelets: 60 10*3/uL — ABNORMAL LOW (ref 150–400)
RBC: 3.68 MIL/uL — ABNORMAL LOW (ref 3.87–5.11)
RDW: 16.9 % — ABNORMAL HIGH (ref 11.5–15.5)
WBC: 3.8 10*3/uL — ABNORMAL LOW (ref 4.0–10.5)
nRBC: 0 % (ref 0.0–0.2)

## 2020-09-15 LAB — COMPREHENSIVE METABOLIC PANEL
ALT: 27 U/L (ref 0–44)
AST: 36 U/L (ref 15–41)
Albumin: 3.4 g/dL — ABNORMAL LOW (ref 3.5–5.0)
Alkaline Phosphatase: 156 U/L — ABNORMAL HIGH (ref 38–126)
Anion gap: 6 (ref 5–15)
BUN: 15 mg/dL (ref 8–23)
CO2: 26 mmol/L (ref 22–32)
Calcium: 8.8 mg/dL — ABNORMAL LOW (ref 8.9–10.3)
Chloride: 109 mmol/L (ref 98–111)
Creatinine, Ser: 0.76 mg/dL (ref 0.44–1.00)
GFR, Estimated: >60 mL/min (ref >60)
Glucose, Bld: 98 mg/dL (ref 70–99)
Potassium: 3.8 mmol/L (ref 3.5–5.1)
Sodium: 141 mmol/L (ref 135–145)
Total Bilirubin: 0.6 mg/dL (ref 0.3–1.2)
Total Protein: 6.6 g/dL (ref 6.5–8.1)

## 2020-09-15 MED ORDER — ACETAMINOPHEN 325 MG PO TABS
650.0000 mg | ORAL_TABLET | Freq: Once | ORAL | Status: AC
  Administered 2020-09-15: 650 mg via ORAL

## 2020-09-15 MED ORDER — ONDANSETRON HCL 4 MG/2ML IJ SOLN
8.0000 mg | Freq: Once | INTRAMUSCULAR | Status: AC
  Administered 2020-09-15: 8 mg via INTRAVENOUS

## 2020-09-15 MED ORDER — DIPHENHYDRAMINE HCL 25 MG PO CAPS
25.0000 mg | ORAL_CAPSULE | Freq: Once | ORAL | Status: AC
  Administered 2020-09-15: 25 mg via ORAL

## 2020-09-15 MED ORDER — SODIUM CHLORIDE 0.9% FLUSH
10.0000 mL | INTRAVENOUS | Status: DC | PRN
  Filled 2020-09-15: qty 10

## 2020-09-15 MED ORDER — SODIUM CHLORIDE 0.9% FLUSH
10.0000 mL | Freq: Once | INTRAVENOUS | Status: AC
  Administered 2020-09-15: 10 mL
  Filled 2020-09-15: qty 10

## 2020-09-15 MED ORDER — SODIUM CHLORIDE 0.9 % IV SOLN
Freq: Once | INTRAVENOUS | Status: AC
  Filled 2020-09-15: qty 250

## 2020-09-15 MED ORDER — SODIUM CHLORIDE 0.9 % IV SOLN
3.6000 mg/kg | Freq: Once | INTRAVENOUS | Status: AC
  Administered 2020-09-15: 300 mg via INTRAVENOUS
  Filled 2020-09-15: qty 15

## 2020-09-15 MED ORDER — HEPARIN SOD (PORK) LOCK FLUSH 100 UNIT/ML IV SOLN
500.0000 [IU] | Freq: Once | INTRAVENOUS | Status: DC | PRN
  Filled 2020-09-15: qty 5

## 2020-09-15 NOTE — Progress Notes (Signed)
OK to treat despite platelet count of 60 per Dr. Jana Hakim.  Sandi Mealy, MHS, PA-C Physician Assistant

## 2020-09-15 NOTE — Progress Notes (Signed)
Per Sandi Mealy PA, States okay to treat with PLT 60  Pt remained for observation period post infusion, remained stable

## 2020-09-15 NOTE — Patient Instructions (Addendum)
[facility] Discharge Instructions for Patients Receiving Chemotherapy  Today you received the following chemotherapy agents Ado-Traztuzumab (Kadcyla)  To help prevent nausea and vomiting after your treatment, we encourage you to take your nausea medication as directed.   If you develop nausea and vomiting that is not controlled by your nausea medication, call the clinic.   BELOW ARE SYMPTOMS THAT SHOULD BE REPORTED IMMEDIATELY:  *FEVER GREATER THAN 100.5 F  *CHILLS WITH OR WITHOUT FEVER  NAUSEA AND VOMITING THAT IS NOT CONTROLLED WITH YOUR NAUSEA MEDICATION  *UNUSUAL SHORTNESS OF BREATH  *UNUSUAL BRUISING OR BLEEDING  TENDERNESS IN MOUTH AND THROAT WITH OR WITHOUT PRESENCE OF ULCERS  *URINARY PROBLEMS  *BOWEL PROBLEMS  UNUSUAL RASH Items with * indicate a potential emergency and should be followed up as soon as possible.  Feel free to call the clinic should you have any questions or concerns. The clinic phone number is [phone].  Please show the [place] at check-in to the Emergency Department and triage nurse.

## 2020-09-15 NOTE — Patient Instructions (Signed)
Implanted Port Insertion, Care After This sheet gives you information about how to care for yourself after your procedure. Your health care provider may also give you more specific instructions. If you have problems or questions, contact your health care provider. What can I expect after the procedure? After the procedure, it is common to have:  Discomfort at the port insertion site.  Bruising on the skin over the port. This should improve over 3-4 days. Follow these instructions at home: Port care  After your port is placed, you will get a manufacturer's information card. The card has information about your port. Keep this card with you at all times.  Take care of the port as told by your health care provider. Ask your health care provider if you or a family member can get training for taking care of the port at home. A home health care nurse may also take care of the port.  Make sure to remember what type of port you have. Incision care  Follow instructions from your health care provider about how to take care of your port insertion site. Make sure you: ? Wash your hands with soap and water before and after you change your bandage (dressing). If soap and water are not available, use hand sanitizer. ? Change your dressing as told by your health care provider. ? Leave stitches (sutures), skin glue, or adhesive strips in place. These skin closures may need to stay in place for 2 weeks or longer. If adhesive strip edges start to loosen and curl up, you may trim the loose edges. Do not remove adhesive strips completely unless your health care provider tells you to do that.  Check your port insertion site every day for signs of infection. Check for: ? Redness, swelling, or pain. ? Fluid or blood. ? Warmth. ? Pus or a bad smell.      Activity  Return to your normal activities as told by your health care provider. Ask your health care provider what activities are safe for you.  Do not  lift anything that is heavier than 10 lb (4.5 kg), or the limit that you are told, until your health care provider says that it is safe. General instructions  Take over-the-counter and prescription medicines only as told by your health care provider.  Do not take baths, swim, or use a hot tub until your health care provider approves. Ask your health care provider if you may take showers. You may only be allowed to take sponge baths.  Do not drive for 24 hours if you were given a sedative during your procedure.  Wear a medical alert bracelet in case of an emergency. This will tell any health care providers that you have a port.  Keep all follow-up visits as told by your health care provider. This is important. Contact a health care provider if:  You cannot flush your port with saline as directed, or you cannot draw blood from the port.  You have a fever or chills.  You have redness, swelling, or pain around your port insertion site.  You have fluid or blood coming from your port insertion site.  Your port insertion site feels warm to the touch.  You have pus or a bad smell coming from the port insertion site. Get help right away if:  You have chest pain or shortness of breath.  You have bleeding from your port that you cannot control. Summary  Take care of the port as told by your   health care provider. Keep the manufacturer's information card with you at all times.  Change your dressing as told by your health care provider.  Contact a health care provider if you have a fever or chills or if you have redness, swelling, or pain around your port insertion site.  Keep all follow-up visits as told by your health care provider. This information is not intended to replace advice given to you by your health care provider. Make sure you discuss any questions you have with your health care provider. Document Revised: 01/16/2018 Document Reviewed: 01/16/2018 Elsevier Patient Education   2021 Elsevier Inc.  

## 2020-09-21 DIAGNOSIS — H2512 Age-related nuclear cataract, left eye: Secondary | ICD-10-CM | POA: Diagnosis not present

## 2020-09-22 DIAGNOSIS — E039 Hypothyroidism, unspecified: Secondary | ICD-10-CM | POA: Diagnosis not present

## 2020-09-22 DIAGNOSIS — M170 Bilateral primary osteoarthritis of knee: Secondary | ICD-10-CM | POA: Diagnosis not present

## 2020-09-22 DIAGNOSIS — G43009 Migraine without aura, not intractable, without status migrainosus: Secondary | ICD-10-CM | POA: Diagnosis not present

## 2020-09-22 DIAGNOSIS — E7800 Pure hypercholesterolemia, unspecified: Secondary | ICD-10-CM | POA: Diagnosis not present

## 2020-09-28 ENCOUNTER — Other Ambulatory Visit: Payer: Self-pay

## 2020-09-28 ENCOUNTER — Inpatient Hospital Stay (HOSPITAL_BASED_OUTPATIENT_CLINIC_OR_DEPARTMENT_OTHER): Payer: Medicare Other | Admitting: Medical

## 2020-09-28 ENCOUNTER — Inpatient Hospital Stay: Payer: Medicare Other

## 2020-09-28 ENCOUNTER — Telehealth: Payer: Self-pay

## 2020-09-28 VITALS — BP 157/85 | HR 84 | Temp 97.6°F | Resp 18 | Ht 60.0 in | Wt 177.8 lb

## 2020-09-28 DIAGNOSIS — C50512 Malignant neoplasm of lower-outer quadrant of left female breast: Secondary | ICD-10-CM

## 2020-09-28 DIAGNOSIS — Z79899 Other long term (current) drug therapy: Secondary | ICD-10-CM | POA: Diagnosis not present

## 2020-09-28 DIAGNOSIS — R110 Nausea: Secondary | ICD-10-CM | POA: Diagnosis not present

## 2020-09-28 DIAGNOSIS — K589 Irritable bowel syndrome without diarrhea: Secondary | ICD-10-CM | POA: Diagnosis not present

## 2020-09-28 DIAGNOSIS — R319 Hematuria, unspecified: Secondary | ICD-10-CM

## 2020-09-28 DIAGNOSIS — Z171 Estrogen receptor negative status [ER-]: Secondary | ICD-10-CM

## 2020-09-28 DIAGNOSIS — J45909 Unspecified asthma, uncomplicated: Secondary | ICD-10-CM | POA: Diagnosis not present

## 2020-09-28 DIAGNOSIS — Z803 Family history of malignant neoplasm of breast: Secondary | ICD-10-CM | POA: Diagnosis not present

## 2020-09-28 DIAGNOSIS — Z5112 Encounter for antineoplastic immunotherapy: Secondary | ICD-10-CM | POA: Diagnosis not present

## 2020-09-28 DIAGNOSIS — Z8051 Family history of malignant neoplasm of kidney: Secondary | ICD-10-CM | POA: Diagnosis not present

## 2020-09-28 DIAGNOSIS — D696 Thrombocytopenia, unspecified: Secondary | ICD-10-CM

## 2020-09-28 DIAGNOSIS — Z95828 Presence of other vascular implants and grafts: Secondary | ICD-10-CM

## 2020-09-28 DIAGNOSIS — E039 Hypothyroidism, unspecified: Secondary | ICD-10-CM | POA: Diagnosis not present

## 2020-09-28 DIAGNOSIS — C773 Secondary and unspecified malignant neoplasm of axilla and upper limb lymph nodes: Secondary | ICD-10-CM | POA: Diagnosis not present

## 2020-09-28 DIAGNOSIS — Z806 Family history of leukemia: Secondary | ICD-10-CM | POA: Diagnosis not present

## 2020-09-28 DIAGNOSIS — Z8049 Family history of malignant neoplasm of other genital organs: Secondary | ICD-10-CM | POA: Diagnosis not present

## 2020-09-28 DIAGNOSIS — Z800 Family history of malignant neoplasm of digestive organs: Secondary | ICD-10-CM | POA: Diagnosis not present

## 2020-09-28 LAB — CBC WITH DIFFERENTIAL (CANCER CENTER ONLY)
Abs Immature Granulocytes: 0.01 10*3/uL (ref 0.00–0.07)
Basophils Absolute: 0 10*3/uL (ref 0.0–0.1)
Basophils Relative: 0 %
Eosinophils Absolute: 0.2 10*3/uL (ref 0.0–0.5)
Eosinophils Relative: 3 %
HCT: 37.4 % (ref 36.0–46.0)
Hemoglobin: 11.9 g/dL — ABNORMAL LOW (ref 12.0–15.0)
Immature Granulocytes: 0 %
Lymphocytes Relative: 24 %
Lymphs Abs: 1.1 10*3/uL (ref 0.7–4.0)
MCH: 30.5 pg (ref 26.0–34.0)
MCHC: 31.8 g/dL (ref 30.0–36.0)
MCV: 95.9 fL (ref 80.0–100.0)
Monocytes Absolute: 0.6 10*3/uL (ref 0.1–1.0)
Monocytes Relative: 12 %
Neutro Abs: 2.7 10*3/uL (ref 1.7–7.7)
Neutrophils Relative %: 61 %
Platelet Count: 66 10*3/uL — ABNORMAL LOW (ref 150–400)
RBC: 3.9 MIL/uL (ref 3.87–5.11)
RDW: 17 % — ABNORMAL HIGH (ref 11.5–15.5)
WBC Count: 4.5 10*3/uL (ref 4.0–10.5)
nRBC: 0 % (ref 0.0–0.2)

## 2020-09-28 LAB — URINALYSIS, COMPLETE (UACMP) WITH MICROSCOPIC
Bilirubin Urine: NEGATIVE
Glucose, UA: NEGATIVE mg/dL
Hgb urine dipstick: NEGATIVE
Ketones, ur: 5 mg/dL — AB
Nitrite: NEGATIVE
Protein, ur: NEGATIVE mg/dL
Specific Gravity, Urine: 1.023 (ref 1.005–1.030)
pH: 5 (ref 5.0–8.0)

## 2020-09-28 LAB — SAMPLE TO BLOOD BANK

## 2020-09-28 LAB — CMP (CANCER CENTER ONLY)
ALT: 30 U/L (ref 0–44)
AST: 45 U/L — ABNORMAL HIGH (ref 15–41)
Albumin: 3.4 g/dL — ABNORMAL LOW (ref 3.5–5.0)
Alkaline Phosphatase: 147 U/L — ABNORMAL HIGH (ref 38–126)
Anion gap: 11 (ref 5–15)
BUN: 14 mg/dL (ref 8–23)
CO2: 26 mmol/L (ref 22–32)
Calcium: 8.7 mg/dL — ABNORMAL LOW (ref 8.9–10.3)
Chloride: 106 mmol/L (ref 98–111)
Creatinine: 0.79 mg/dL (ref 0.44–1.00)
GFR, Estimated: >60 mL/min (ref >60)
Glucose, Bld: 85 mg/dL (ref 70–99)
Potassium: 3.9 mmol/L (ref 3.5–5.1)
Sodium: 143 mmol/L (ref 135–145)
Total Bilirubin: 0.7 mg/dL (ref 0.3–1.2)
Total Protein: 6.9 g/dL (ref 6.5–8.1)

## 2020-09-28 MED ORDER — HEPARIN SOD (PORK) LOCK FLUSH 100 UNIT/ML IV SOLN
500.0000 [IU] | Freq: Once | INTRAVENOUS | Status: AC
  Administered 2020-09-28: 500 [IU]
  Filled 2020-09-28: qty 5

## 2020-09-28 MED ORDER — SODIUM CHLORIDE 0.9% FLUSH
10.0000 mL | Freq: Once | INTRAVENOUS | Status: AC
  Administered 2020-09-28: 10 mL
  Filled 2020-09-28: qty 10

## 2020-09-28 NOTE — Telephone Encounter (Signed)
RN spoke with patient regarding concerns with hematuria and weakness.    Patient is s/p D1C11 of Kadcyla from 3/15 with history of thrombocytopenia.  Patient noticed blood in urine over the weekend.  Denies any dysuria, fevers, or any abnormal bruising.  Patient eating well and drinking approximately 48 oz of water daily.   RN reviewed with SM provider, Sandi Mealy.  Recommendations for patient to be evaluated with labs and visit today.   RN notified patient, patient verbalized understanding and agreement.  Scheduling message sent/orders placed.

## 2020-09-29 LAB — URINE CULTURE

## 2020-10-01 NOTE — Progress Notes (Signed)
[facility] Progress Note   Julie Thompson [id number] 09/26/1945 75 y.o.  Julie Thompson is managed by Dr. 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 Jana Hakim and is status post cycle #11, day #1 of Kadcyla which was dosed on 09/15/2020.  She is scheduled to return for next treatment on 10/06/2020.  Hematuria and thrombocytopenia: She has had no additional hematuria since this morning.  A CBC returned today with a platelet count of 66.  A CBC will be repeated on her return.  She was told to return sooner or present to the emergency room should she have a recurrence of hematuria.  Please see After Visit Summary for patient specific instructions.  Future Appointments  Date Time Provider [place]  10/06/2020 10:15 AM [facility] LAB [facility] None  10/06/2020 10:30 AM [facility] INFUSION [facility] None  10/06/2020 11:00 AM Duane Earnshaw, Lucianne Lei E., PA-C [facility] None  10/06/2020 12:00 PM [facility] INFUSION [facility] None  10/27/2020 12:15 PM [facility] LAB [facility] None  10/27/2020 12:30 PM [facility] [place] FLUSH [facility] None  10/27/2020  1:00 PM Magrinat, Virgie Dad, MD [facility] None  10/27/2020  2:00 PM [facility] INFUSION [facility] None  11/11/2020  9:15 AM [facility] ECHO OP 2 [facility]  11/11/2020 10:20 AM Bensimhon, Shaune Pascal, MD [facility] None  12/21/2020  2:30 PM [facility] DX DEXA 1 [facility] GI-BREAST CE    No orders of the defined types were placed in this encounter.      Subjective:   Patient ID:  Julie Thompson is a 75 y.o. (DOB 12-Jul-1945) female.  Chief Complaint: No chief complaint on file.   HPI Julie Thompson  is a 75 y.o. 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  She is managed by Dr. Jana Hakim and is status post cycle #11, day #1 of Kadcyla which was dosed on 09/15/2020.  She has had recent thrombocytopenia with her treatments.  She was noted to have a platelet count of 68 on 09/15/2020.  Her platelet count today was at 66.  She reported that on Saturday evening and Sunday morning which she noted hematuria with wiping.  She also reported several small blood clots in her urine.  She denies any dysuria.  She had a presyncopal episode yesterday and is feeling very weak.  She is eating and drinking.  She states that she is lightheaded.  She has had no hematuria since this morning.  Medications: I have reviewed the patient's current medications.  Allergies:  Allergies  Allergen Reactions  . Penicillins Hives    [place]   . Sulfa Antibiotics Rash    Past Medical History:  Diagnosis Date  . Arthritis 2012  . Asthma    no problems now  . Family history of basal cell carcinoma   . Family history of breast cancer   . Family history of colon cancer   . Family  history of kidney cancer   . Family history of leukemia   . Family history of peritoneal cancer   . GERD (gastroesophageal reflux disease)   . H/O hiatal hernia   . Hyperlipidemia   . Hypothyroidism   . IBS (irritable bowel syndrome)   . lt breast ca dx'd 07/10/19    Past Surgical History:  Procedure Laterality Date  . ABDOMINAL HYSTERECTOMY     age 53  . BREAST LUMPECTOMY WITH RADIOACTIVE SEED AND AXILLARY LYMPH NODE DISSECTION Left 01/16/2020   Procedure: LEFT BREAST LUMPECTOMY WITH  RADIOACTIVE SEED AND TARGETED AXILLARY LYMPH NODE DISSECTION;  Surgeon: Donnie Mesa, MD;  Location: [place];  Service: General;  Laterality: Left;  LMA  . BREAST SURGERY    . DILATION AND CURETTAGE OF UTERUS     2 miscarriages age 70 & 36  . FLEXIBLE SIGMOIDOSCOPY N/A 10/22/2013   Procedure: FLEXIBLE SIGMOIDOSCOPY;  Surgeon: Garlan Fair, MD;  Location: [facility];  Service: Endoscopy;  Laterality: N/A;  . KNEE ARTHROSCOPY Left   . PORT-A-CATH REMOVAL N/A 01/16/2020   Procedure: REMOVAL PORT-A-CATH;  Surgeon: Donnie Mesa, MD;  Location: [place];  Service: General;  Laterality: N/A;  . PORTACATH PLACEMENT Right 07/29/2019   Procedure: INSERTION PORT-A-CATH WITH ULTRASOUND;  Surgeon: Donnie Mesa, MD;  Location: [place];  Service: General;  Laterality: Right;  . PORTACATH PLACEMENT N/A 02/13/2020   Procedure: INSERTION PORT-A-CATH WITH ULTRASOUND GUIDANCE;  Surgeon: Donnie Mesa, MD;  Location: [place];  Service: General;  Laterality: N/A;  . TONSILLECTOMY     age 45    Family History  Problem Relation Age of Onset  . Breast cancer Maternal Aunt        dx. in her 49s  . Cancer Mother 69       peritoneal cancer, death certificate lists ovarian cancer  . Colon cancer Paternal Uncle        dx. late 61s or early 32s  . Basal cell carcinoma Sister 96       a second diagnosed in her 76s  . Kidney cancer Maternal Grandmother 36  . Diabetes Paternal Grandmother   . Heart Problems Paternal Grandmother   . Leukemia Paternal Uncle        dx. early 59s    Social History   Socioeconomic History  . Marital status: Married    Spouse name: Not on file  . Number of children: Not on file  . Years of education: Not on file  . Highest education level: Not on file  Occupational History  . Not on file  Tobacco Use  . Smoking status: Never Smoker  . Smokeless tobacco: Never Used  Vaping Use  . Vaping Use: Never used  Substance and Sexual Activity  .  Alcohol use: Never  . Drug use: No  . Sexual activity: Not Currently    Birth control/protection: Surgical  Other Topics Concern  . Not on file  Social History Narrative  . Not on file   Social Determinants of Health   Financial Resource Strain: Not on file  Food Insecurity: Not on file  Transportation Needs: Not on file  Physical Activity: Not on file  Stress: Not on file  Social Connections: Not on file  Intimate Partner Violence: Not on file    Past Medical History, Surgical history, Social history, and Family history were reviewed and updated as appropriate.   Please see review of systems for further details on the patient's review  from today.   Review of Systems:  Review of Systems  Constitutional: Negative for chills, diaphoresis and fever.  HENT: Negative for trouble swallowing and voice change.   Respiratory: Negative for cough, chest tightness, shortness of breath and wheezing.   Cardiovascular: Negative for chest pain and palpitations.  Gastrointestinal: Negative for abdominal pain, constipation, diarrhea, nausea and vomiting.  Genitourinary: Positive for hematuria. Negative for decreased urine volume, difficulty urinating, dysuria, flank pain, frequency, urgency and vaginal bleeding.  Musculoskeletal: Negative for back pain and myalgias.  Neurological: Positive for weakness and light-headedness. Negative for dizziness and headaches.       Presyncope on Sunday    Objective:   Physical Exam:  BP (!) 157/85 (BP Location: Left Arm, Patient Position: Sitting)   Pulse 84   Temp 97.6 F (36.4 C) (Tympanic)   Resp 18   Ht 5' (1.524 m)   Wt 177 lb 12.8 oz (80.6 kg)   SpO2 96%   BMI 34.72 kg/m  ECOG: 1  Orthostatic Blood Pressure: Blood pressure:   sitting 137/76, standing 124/71 Pulse:   sitting 73, standing 85    Physical Exam Constitutional:      General: She is not in acute distress.    Appearance: She is not diaphoretic.  HENT:     Head:  Normocephalic and atraumatic.  Eyes:     General: No scleral icterus.       Right eye: No discharge.        Left eye: No discharge.  Cardiovascular:     Rate and Rhythm: Normal rate and regular rhythm.     Heart sounds: Normal heart sounds. No murmur heard. No friction rub. No gallop.   Pulmonary:     Effort: Pulmonary effort is normal. No respiratory distress.     Breath sounds: Normal breath sounds. No wheezing or rales.  Skin:    General: Skin is warm and dry.     Findings: No erythema or rash.  Neurological:     Mental Status: She is alert.     Coordination: Coordination normal.     Gait: Gait normal.  Psychiatric:        Mood and Affect: Mood normal.        Behavior: Behavior normal.        Thought Content: Thought content normal.        Judgment: Judgment normal.     Lab Review:     Component Value Date/Time   NA 143 09/28/2020 1335   K 3.9 09/28/2020 1335   CL 106 09/28/2020 1335   CO2 26 09/28/2020 1335   GLUCOSE 85 09/28/2020 1335   BUN 14 09/28/2020 1335   CREATININE 0.79 09/28/2020 1335   CALCIUM 8.7 (L) 09/28/2020 1335   PROT 6.9 09/28/2020 1335   ALBUMIN 3.4 (L) 09/28/2020 1335   AST 45 (H) 09/28/2020 1335   ALT 30 09/28/2020 1335   ALKPHOS 147 (H) 09/28/2020 1335   BILITOT 0.7 09/28/2020 1335   GFRNONAA >60 09/28/2020 1335   GFRAA >60 03/30/2020 0910   GFRAA >60 07/17/2019 0823       Component Value Date/Time   WBC 4.5 09/28/2020 1335   WBC 3.8 (L) 09/15/2020 1300   RBC 3.90 09/28/2020 1335   HGB 11.9 (L) 09/28/2020 1335   HCT 37.4 09/28/2020 1335   PLT 66 (L) 09/28/2020 1335   MCV 95.9 09/28/2020 1335   MCH 30.5 09/28/2020 1335   MCHC 31.8 09/28/2020 1335   RDW 17.0 (H) 09/28/2020  1335   LYMPHSABS 1.1 09/28/2020 1335   MONOABS 0.6 09/28/2020 1335   EOSABS 0.2 09/28/2020 1335   BASOSABS 0.0 09/28/2020 1335   -------------------------------  Imaging from last 24 hours (if applicable):  Radiology interpretation: No results found.

## 2020-10-06 ENCOUNTER — Inpatient Hospital Stay: Payer: Medicare Other | Attending: Medical

## 2020-10-06 ENCOUNTER — Ambulatory Visit: Payer: Medicare Other

## 2020-10-06 ENCOUNTER — Other Ambulatory Visit: Payer: Self-pay

## 2020-10-06 ENCOUNTER — Other Ambulatory Visit: Payer: Self-pay | Admitting: Oncology

## 2020-10-06 ENCOUNTER — Other Ambulatory Visit: Payer: Medicare Other

## 2020-10-06 ENCOUNTER — Inpatient Hospital Stay: Payer: Medicare Other

## 2020-10-06 ENCOUNTER — Inpatient Hospital Stay (HOSPITAL_BASED_OUTPATIENT_CLINIC_OR_DEPARTMENT_OTHER): Payer: Medicare Other | Admitting: Medical

## 2020-10-06 VITALS — BP 135/80 | HR 80 | Temp 97.8°F | Resp 16 | Ht 60.0 in | Wt 178.2 lb

## 2020-10-06 DIAGNOSIS — D696 Thrombocytopenia, unspecified: Secondary | ICD-10-CM | POA: Diagnosis not present

## 2020-10-06 DIAGNOSIS — Z5112 Encounter for antineoplastic immunotherapy: Secondary | ICD-10-CM | POA: Diagnosis not present

## 2020-10-06 DIAGNOSIS — J45909 Unspecified asthma, uncomplicated: Secondary | ICD-10-CM | POA: Diagnosis not present

## 2020-10-06 DIAGNOSIS — C50512 Malignant neoplasm of lower-outer quadrant of left female breast: Secondary | ICD-10-CM | POA: Diagnosis not present

## 2020-10-06 DIAGNOSIS — Z171 Estrogen receptor negative status [ER-]: Secondary | ICD-10-CM

## 2020-10-06 DIAGNOSIS — E039 Hypothyroidism, unspecified: Secondary | ICD-10-CM | POA: Insufficient documentation

## 2020-10-06 DIAGNOSIS — K589 Irritable bowel syndrome without diarrhea: Secondary | ICD-10-CM | POA: Diagnosis not present

## 2020-10-06 LAB — CBC WITH DIFFERENTIAL/PLATELET
Abs Immature Granulocytes: 0 10*3/uL (ref 0.00–0.07)
Basophils Absolute: 0 10*3/uL (ref 0.0–0.1)
Basophils Relative: 1 %
Eosinophils Absolute: 0.1 10*3/uL (ref 0.0–0.5)
Eosinophils Relative: 3 %
HCT: 37.3 % (ref 36.0–46.0)
Hemoglobin: 11.6 g/dL — ABNORMAL LOW (ref 12.0–15.0)
Immature Granulocytes: 0 %
Lymphocytes Relative: 25 %
Lymphs Abs: 0.9 10*3/uL (ref 0.7–4.0)
MCH: 30.1 pg (ref 26.0–34.0)
MCHC: 31.1 g/dL (ref 30.0–36.0)
MCV: 96.9 fL (ref 80.0–100.0)
Monocytes Absolute: 0.3 10*3/uL (ref 0.1–1.0)
Monocytes Relative: 9 %
Neutro Abs: 2.3 10*3/uL (ref 1.7–7.7)
Neutrophils Relative %: 62 %
Platelets: 52 10*3/uL — ABNORMAL LOW (ref 150–400)
RBC: 3.85 MIL/uL — ABNORMAL LOW (ref 3.87–5.11)
RDW: 17.4 % — ABNORMAL HIGH (ref 11.5–15.5)
WBC: 3.6 10*3/uL — ABNORMAL LOW (ref 4.0–10.5)
nRBC: 0 % (ref 0.0–0.2)

## 2020-10-06 LAB — COMPREHENSIVE METABOLIC PANEL
ALT: 23 U/L (ref 0–44)
AST: 34 U/L (ref 15–41)
Albumin: 3.3 g/dL — ABNORMAL LOW (ref 3.5–5.0)
Alkaline Phosphatase: 148 U/L — ABNORMAL HIGH (ref 38–126)
Anion gap: 10 (ref 5–15)
BUN: 15 mg/dL (ref 8–23)
CO2: 24 mmol/L (ref 22–32)
Calcium: 8.4 mg/dL — ABNORMAL LOW (ref 8.9–10.3)
Chloride: 110 mmol/L (ref 98–111)
Creatinine, Ser: 0.79 mg/dL (ref 0.44–1.00)
GFR, Estimated: >60 mL/min (ref >60)
Glucose, Bld: 123 mg/dL — ABNORMAL HIGH (ref 70–99)
Potassium: 3.9 mmol/L (ref 3.5–5.1)
Sodium: 144 mmol/L (ref 135–145)
Total Bilirubin: 0.6 mg/dL (ref 0.3–1.2)
Total Protein: 6.4 g/dL — ABNORMAL LOW (ref 6.5–8.1)

## 2020-10-06 MED ORDER — ACETAMINOPHEN 325 MG PO TABS
650.0000 mg | ORAL_TABLET | Freq: Once | ORAL | Status: AC
  Administered 2020-10-06: 650 mg via ORAL

## 2020-10-06 MED ORDER — HEPARIN SOD (PORK) LOCK FLUSH 100 UNIT/ML IV SOLN
500.0000 [IU] | Freq: Once | INTRAVENOUS | Status: AC | PRN
  Administered 2020-10-06: 500 [IU]
  Filled 2020-10-06: qty 5

## 2020-10-06 MED ORDER — DIPHENHYDRAMINE HCL 25 MG PO CAPS
ORAL_CAPSULE | ORAL | Status: AC
  Filled 2020-10-06: qty 1

## 2020-10-06 MED ORDER — SODIUM CHLORIDE 0.9% FLUSH
10.0000 mL | INTRAVENOUS | Status: DC | PRN
  Administered 2020-10-06: 10 mL
  Filled 2020-10-06: qty 10

## 2020-10-06 MED ORDER — SODIUM CHLORIDE 0.9 % IV SOLN
Freq: Once | INTRAVENOUS | Status: AC
  Filled 2020-10-06: qty 250

## 2020-10-06 MED ORDER — TRASTUZUMAB-ANNS CHEMO 150 MG IV SOLR
6.0000 mg/kg | Freq: Once | INTRAVENOUS | Status: AC
  Administered 2020-10-06: 504 mg via INTRAVENOUS
  Filled 2020-10-06: qty 24

## 2020-10-06 MED ORDER — DIPHENHYDRAMINE HCL 25 MG PO CAPS
25.0000 mg | ORAL_CAPSULE | Freq: Once | ORAL | Status: AC
  Administered 2020-10-06: 25 mg via ORAL

## 2020-10-06 MED ORDER — ACETAMINOPHEN 325 MG PO TABS
ORAL_TABLET | ORAL | Status: AC
  Filled 2020-10-06: qty 2

## 2020-10-06 NOTE — Patient Instructions (Signed)
[facility] Discharge Instructions for Patients Receiving Chemotherapy  Today you received the following chemotherapy agents trastuzumab.  To help prevent nausea and vomiting after your treatment, we encourage you to take your nausea medication as directed.    If you develop nausea and vomiting that is not controlled by your nausea medication, call the clinic.   BELOW ARE SYMPTOMS THAT SHOULD BE REPORTED IMMEDIATELY:  *FEVER GREATER THAN 100.5 F  *CHILLS WITH OR WITHOUT FEVER  NAUSEA AND VOMITING THAT IS NOT CONTROLLED WITH YOUR NAUSEA MEDICATION  *UNUSUAL SHORTNESS OF BREATH  *UNUSUAL BRUISING OR BLEEDING  TENDERNESS IN MOUTH AND THROAT WITH OR WITHOUT PRESENCE OF ULCERS  *URINARY PROBLEMS  *BOWEL PROBLEMS  UNUSUAL RASH Items with * indicate a potential emergency and should be followed up as soon as possible.  Feel free to call the clinic should you have any questions or concerns. The clinic phone number is [phone].  Please show the CHEMO ALERT CARD at check-in to the Emergency Department and triage nurse.   

## 2020-10-06 NOTE — Progress Notes (Signed)
OK to treat per Dr. Jana Hakim despite platelet count of 52. Will receive Kanj[place]i. Arvella Merles has been discontinued.  Sandi Mealy, MHS, PA-C Physician Assistant

## 2020-10-06 NOTE — Patient Instructions (Signed)

## 2020-10-06 NOTE — Progress Notes (Signed)
Pt beginning [place] today. No load per MD.  Acquanetta Belling, RPH, BCPS, BCOP 10/06/2020 12:09 PM

## 2020-10-07 NOTE — Progress Notes (Signed)
[facility] Progress Note   Angelene Rome [id number] March 11, 1946 75 y.o.  [name] is managed by Dr. Lurline Del  Actively treated with chemotherapy/immunotherapy/hormonal therapy: yes  Current therapy:  Kadcyla  Ms. Buffkin is transitioning to [place] due to progression of thrombocytopenia.  Last treated: 09/15/2020 (cycle #11, day #1)  Next scheduled appointment with provider: 10/27/2020  Assessment: Plan:    Malignant neoplasm of lower-outer quadrant of left breast of female, estrogen receptor negative (HCC)  Thrombocytopenia ([place])   ER negative left breast cancer: Ms. Overley presents for consideration of cycle # 11, day #1 of Kadcyla. She will return for labs at a follow-up visit prior to her next scheduled treatment on 10/02/2020 and will be seen ny Dr. Jana Hakim on 10/27/2020.  Thrombocytopenia: CBC returned today with a platelet count of 52.  She reports 1 episode of bright red blood per rectum with wiping.  She was told to return should she have additional episodes or should this worsen.  She was told to return if she develops any other bleeding or increased bruising.  She expresses understanding and agreement.  Please see After Visit Summary for patient specific instructions.  Future Appointments  Date Time Provider [place]  10/27/2020 12:15 PM [facility] LAB [facility] None  10/27/2020 12:30 PM [facility] FLUSH [facility] None  10/27/2020  1:00 PM Magrinat, Virgie Dad, MD [facility] None  10/27/2020  2:00 PM [facility] INFUSION [facility] None  11/11/2020  9:15 AM [facility] ECHO OP 2 [facility]  11/11/2020 10:20 AM Bensimhon, Shaune Pascal, MD [facility] None  12/21/2020  2:30 PM [facility] DX DEXA 1 [facility] GI-BREAST CE    No orders of the defined types were placed in this encounter.      Subjective:   Patient ID:  Baylee Campus is a 75 y.o. (DOB [birth year]) female.  Chief Complaint: No chief complaint on  file.   HPI Tiffony Kite  is a 75 y.o. female with a diagnosis of an ER negative left breast cancer. She is followed by Dr. Jana Hakim and presents for consideration of cycle # 12, day #1 of Kadcyla.  She reports having one episode of bright red blood per rectum with wiping.  She otherwise denies any evidence of bleeding or increased bruising.  She denies fevers, chills, sweats, nausea, vomiting, constipation, or diarrhea.  She will be transitioned to [place] today given that her thrombocytopenia is progressed with her platelet count returning at 52 today.  Medications: I have reviewed the patient's current medications.  Allergies:  Allergies  Allergen Reactions  . Penicillins Hives    [place]   . Sulfa Antibiotics Rash    Past Medical History:  Diagnosis Date  . Arthritis 2012  . Asthma    no problems now  . Family history of basal cell carcinoma   . Family history of breast cancer   . Family history of colon cancer   . Family history of kidney cancer   . Family history of leukemia   . Family history of peritoneal cancer   . GERD (gastroesophageal reflux disease)   . H/O hiatal hernia   . Hyperlipidemia   . Hypothyroidism   . IBS (irritable bowel syndrome)   . lt breast ca dx'd 07/10/19    Past Surgical History:  Procedure Laterality Date  . ABDOMINAL HYSTERECTOMY     age 89  . BREAST LUMPECTOMY WITH RADIOACTIVE SEED AND AXILLARY LYMPH NODE DISSECTION Left 01/16/2020   Procedure: LEFT BREAST LUMPECTOMY WITH  RADIOACTIVE SEED AND TARGETED AXILLARY LYMPH NODE DISSECTION;  Surgeon: Donnie Mesa, MD;  Location: [facility];  Service: General;  Laterality: Left;  LMA  . BREAST SURGERY    . DILATION AND CURETTAGE OF UTERUS     2 miscarriages age 51 & 28  . FLEXIBLE SIGMOIDOSCOPY N/A 10/22/2013   Procedure: FLEXIBLE SIGMOIDOSCOPY;  Surgeon: Garlan Fair, MD;  Location: [facility];  Service: Endoscopy;  Laterality: N/A;  . KNEE ARTHROSCOPY Left   . PORT-A-CATH REMOVAL N/A  01/16/2020   Procedure: REMOVAL PORT-A-CATH;  Surgeon: Donnie Mesa, MD;  Location: [place];  Service: General;  Laterality: N/A;  . PORTACATH PLACEMENT Right 07/29/2019   Procedure: INSERTION PORT-A-CATH WITH ULTRASOUND;  Surgeon: Donnie Mesa, MD;  Location: [place];  Service: General;  Laterality: Right;  . PORTACATH PLACEMENT N/A 02/13/2020   Procedure: INSERTION PORT-A-CATH WITH ULTRASOUND GUIDANCE;  Surgeon: Donnie Mesa, MD;  Location: [place];  Service: General;  Laterality: N/A;  . TONSILLECTOMY     age 15    Family History  Problem Relation Age of Onset  . Breast cancer Maternal Aunt        dx. in her 27s  . Cancer Mother 104       peritoneal cancer, death certificate lists ovarian cancer  . Colon cancer Paternal Uncle        dx. late 75s or early 78s  . Basal cell carcinoma Sister 53       a second diagnosed in her 17s  . Kidney cancer Maternal Grandmother 86  . Diabetes Paternal Grandmother   . Heart Problems Paternal Grandmother   . Leukemia Paternal Uncle        dx. early 25s    Social History   Socioeconomic History  . Marital status: Married    Spouse name: Not on file  . Number of children: Not on file  . Years of education: Not on file  . Highest education level: Not on file  Occupational History  . Not on file  Tobacco Use  . Smoking status: Never Smoker  . Smokeless tobacco: Never Used  Vaping Use  . Vaping Use: Never used  Substance and Sexual Activity  . Alcohol use: Never  . Drug use: No  . Sexual activity: Not Currently    Birth control/protection: Surgical  Other Topics Concern  . Not on file  Social History Narrative  . Not on file   Social Determinants of Health   Financial Resource Strain: Not on file  Food Insecurity: Not on file  Transportation Needs: Not on file  Physical Activity: Not on file  Stress: Not on file  Social Connections: Not on file  Intimate Partner Violence: Not on file     Past Medical History, Surgical history, Social history, and Family history were reviewed and updated as appropriate.   Please see review of systems for further details on the patient's review from today.   Review of Systems:  Review of Systems  Constitutional: Negative for chills, diaphoresis and fever.  HENT: Negative for trouble swallowing and voice change.   Respiratory: Negative for cough, chest tightness, shortness of breath and wheezing.   Cardiovascular: Negative for chest pain and palpitations.  Gastrointestinal: Positive for anal bleeding. Negative for abdominal pain, constipation, diarrhea, nausea and vomiting.  Musculoskeletal: Negative for back pain and myalgias.  Neurological: Negative for dizziness, light-headedness and headaches.    Objective:   Physical Exam:  BP 135/80 (BP Location: Right Arm,  Patient Position: Sitting)   Pulse 80   Temp 97.8 F (36.6 C) (Tympanic)   Resp 16   Ht 5' (1.524 m)   Wt 178 lb 3.2 oz (80.8 kg)   SpO2 97%   BMI 34.80 kg/m  ECOG: 0  Physical Exam Constitutional:      General: She is not in acute distress.    Appearance: She is not diaphoretic.  HENT:     Head: Normocephalic and atraumatic.  Eyes:     General: No scleral icterus.       Right eye: No discharge.        Left eye: No discharge.     Conjunctiva/sclera: Conjunctivae normal.  Cardiovascular:     Rate and Rhythm: Normal rate and regular rhythm.     Heart sounds: Normal heart sounds. No murmur heard. No friction rub. No gallop.   Pulmonary:     Effort: Pulmonary effort is normal. No respiratory distress.     Breath sounds: Normal breath sounds. No wheezing or rales.  Skin:    General: Skin is warm and dry.     Findings: No erythema or rash.  Neurological:     Mental Status: She is alert.     Coordination: Coordination normal.     Gait: Gait normal.  Psychiatric:        Mood and Affect: Mood normal.        Behavior: Behavior normal.        Thought  Content: Thought content normal.        Judgment: Judgment normal.     Lab Review:     Component Value Date/Time   NA 144 10/06/2020 1020   K 3.9 10/06/2020 1020   CL 110 10/06/2020 1020   CO2 24 10/06/2020 1020   GLUCOSE 123 (H) 10/06/2020 1020   BUN 15 10/06/2020 1020   CREATININE 0.79 10/06/2020 1020   CREATININE 0.79 09/28/2020 1335   CALCIUM 8.4 (L) 10/06/2020 1020   PROT 6.4 (L) 10/06/2020 1020   ALBUMIN 3.3 (L) 10/06/2020 1020   AST 34 10/06/2020 1020   AST 45 (H) 09/28/2020 1335   ALT 23 10/06/2020 1020   ALT 30 09/28/2020 1335   ALKPHOS 148 (H) 10/06/2020 1020   BILITOT 0.6 10/06/2020 1020   BILITOT 0.7 09/28/2020 1335   GFRNONAA >60 10/06/2020 1020   GFRNONAA >60 09/28/2020 1335   GFRAA >60 03/30/2020 0910   GFRAA >60 07/17/2019 0823       Component Value Date/Time   WBC 3.6 (L) 10/06/2020 1020   RBC 3.85 (L) 10/06/2020 1020   HGB 11.6 (L) 10/06/2020 1020   HGB 11.9 (L) 09/28/2020 1335   HCT 37.3 10/06/2020 1020   PLT 52 (L) 10/06/2020 1020   PLT 66 (L) 09/28/2020 1335   MCV 96.9 10/06/2020 1020   MCH 30.1 10/06/2020 1020   MCHC 31.1 10/06/2020 1020   RDW 17.4 (H) 10/06/2020 1020   LYMPHSABS 0.9 10/06/2020 1020   MONOABS 0.3 10/06/2020 1020   EOSABS 0.1 10/06/2020 1020   BASOSABS 0.0 10/06/2020 1020   -------------------------------  Imaging from last 24 hours (if applicable):  Radiology interpretation: No results found.      This case was discussed with Dr. Jana Hakim. He expressed his agreement with my management of this patient.

## 2020-10-26 NOTE — Progress Notes (Signed)
[facility]  Telephone:(336) [phone] Fax:(336) [phone]     ID: Julie Thompson DOB: 1945/08/07  MR#: [id number]  JYN#:[id number]  Patient Care Team: Leeroy Cha, MD as PCP - General (Internal Medicine) Rockwell Germany, RN as Oncology Nurse Navigator Mauro Kaufmann, RN as Oncology Nurse Navigator Eppie Gibson, MD as Attending Physician (Radiation Oncology) Donnie Mesa, MD as Consulting Physician (General Surgery) Waris Rodger, Virgie Dad, MD as Consulting Physician (Oncology) Melrose Nakayama, MD as Consulting Physician (Orthopedic Surgery) Bensimhon, Shaune Pascal, MD as Consulting Physician (Cardiology) Chauncey Cruel, MD OTHER MD:  CHIEF COMPLAINT: 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   She continues on adjuvant Kadcyla/ T-DM1.  Today is the 13th of 14 planned doses.  She has generally tolerated this well.  Since her last visit, she underwent repeat echocardiogram on 08/10/2020 showing an ejection fraction of 60-65%, which is back to her baseline.  Of note, she is scheduled for bone density screening on 12/21/2020.   REVIEW OF SYSTEMS: Julie Thompson is doing generally well.  Her hair is coming in graying curly and she does have a bald crown at this point which is not apparent because of the way she has fixed her hair.  She is walking for exercise, does get a little tired.  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  She has had a little bit of blood on the tissue when she has a bowel movement and is aware that she does have some hemorrhoids.  A detailed review of systems was otherwise stable   COVID 19 VACCINATION STATUS: Status post Pfizer x1, July 2021, no booster as of 08/04/2020    HISTORY OF CURRENT ILLNESS: From the original intake note:  Julie Thompson had routine screening mammography on 06/21/2019 showing a possible abnormality in the left breast. She underwent left diagnostic mammography with tomography and left breast ultrasonography at [facility] on 07/01/2019 showing: breast density category C; either 2 adjacent masses or 1 complex dumbbell-shaped mass containing calcifications in the left breast at 6 o'clock, the largest of which measures 2.1 cm; chest wall invasion not excluded; 1-2 mm group of calcifications just anterior to the mass(es); single abnormal lymph node in the left axilla; vascular calcifications in anterior left breast.  Accordingly on 07/09/2019 she proceeded to biopsy of the left breast areas in question. The pathology from this procedure ([id number]) showed:  1. Left Breast, 5:30, posterior  - invasive ductal carcinoma with necrosis, grade 3  - Prognostic indicators significant for: estrogen receptor, 0% negative and progesterone receptor, 0% negative. Proliferation marker Ki67 at 60%. HER2 negative by immunohistochemistry (0). 2. Left Breast, 5:30, posterior  - invasive ductal carcinoma with necrosis, grade 3  - ductal carcinoma in situ, intermediate grade 3. Left Axilla, lymph node (1)  - metastatic carcinoma involving a lymph node  And additional biopsy of the anterior vascular calcifications in the left breast was performed on 07/10/2019. Pathology [id number]) showed: fibrocystic changes with calcifications; no malignancy identified.  This was felt to be concordant.  The patient's subsequent history is as detailed below.   PAST MEDICAL HISTORY: Past Medical History:  Diagnosis Date  . Arthritis 2012  . Asthma    no problems now  . Family history of basal cell carcinoma   . Family history of breast cancer   .  Family history of colon cancer   . Family history of kidney cancer   . Family history of leukemia   . Family history of peritoneal cancer   . GERD (gastroesophageal reflux disease)   . H/O  hiatal hernia   . Hyperlipidemia   . Hypothyroidism   . IBS (irritable bowel syndrome)   . lt breast ca dx'd 07/10/19    PAST SURGICAL HISTORY: Past Surgical History:  Procedure Laterality Date  . ABDOMINAL HYSTERECTOMY     age 20  . BREAST LUMPECTOMY WITH RADIOACTIVE SEED AND AXILLARY LYMPH NODE DISSECTION Left 01/16/2020   Procedure: LEFT BREAST LUMPECTOMY WITH RADIOACTIVE SEED AND TARGETED AXILLARY LYMPH NODE DISSECTION;  Surgeon: Donnie Mesa, MD;  Location: [facility];  Service: General;  Laterality: Left;  LMA  . BREAST SURGERY    . DILATION AND CURETTAGE OF UTERUS     2 miscarriages age 26 & 37  . FLEXIBLE SIGMOIDOSCOPY N/A 10/22/2013   Procedure: FLEXIBLE SIGMOIDOSCOPY;  Surgeon: Garlan Fair, MD;  Location: [facility];  Service: Endoscopy;  Laterality: N/A;  . KNEE ARTHROSCOPY Left   . PORT-A-CATH REMOVAL N/A 01/16/2020   Procedure: REMOVAL PORT-A-CATH;  Surgeon: Donnie Mesa, MD;  Location: [facility];  Service: General;  Laterality: N/A;  . PORTACATH PLACEMENT Right 07/29/2019   Procedure: INSERTION PORT-A-CATH WITH ULTRASOUND;  Surgeon: Donnie Mesa, MD;  Location: [facility];  Service: General;  Laterality: Right;  . PORTACATH PLACEMENT N/A 02/13/2020   Procedure: INSERTION PORT-A-CATH WITH ULTRASOUND GUIDANCE;  Surgeon: Donnie Mesa, MD;  Location: [facility];  Service: General;  Laterality: N/A;  . TONSILLECTOMY     age 66    FAMILY HISTORY: Family History  Problem Relation Age of Onset  . Breast cancer Maternal Aunt        dx. in her 21s  . Cancer Mother 36       peritoneal cancer, death certificate lists ovarian cancer  . Colon cancer Paternal Uncle        dx. late 58s or early 43s  . Basal cell carcinoma Sister 29       a second diagnosed in her 18s  . Kidney cancer Maternal Grandmother 47  . Diabetes Paternal Grandmother   . Heart Problems Paternal Grandmother   . Leukemia Paternal Uncle        dx. early 75s  Patient's father  is currently living at age 43 as of 2019-08-18. Patient's mother died from ovarian cancer at age 83. She was diagnosed at age 46. The patient reports breast cancer in a maternal aunt, diagnosed in her 43's. The patient also noted colon cancer in a paternal uncle and kidney cancer in her maternal grandmother.  She has 1 sister.   GYNECOLOGIC HISTORY:  No LMP recorded. Patient has had a hysterectomy. Menarche: [age] years old Age at first live birth: 75 years old [place] P 2 LMP age 60 (with hysterectomy) Contraceptive: never used HRT used for 10-12 years  Hysterectomy? Yes, age 79 BSO? 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  Husband [name] is a retired Psychologist, prison and probation services. She lives at home with husband Butch. She has two children from a prior marriage. Son [name], age 21, works as a Games developer in [place], [place]. Son [name], age 86, works as a Programme researcher, broadcasting/film/video in [place], [place]. The patient has 4 grandchildren and 4 great-grandchildren. She attends [employer].  ADVANCED DIRECTIVES: In the absence of any documentation to the contrary, the patient's spouse is their HCPOA.    HEALTH MAINTENANCE: Social History   Tobacco Use  . Smoking status: Never Smoker  . Smokeless tobacco: Never Used  Vaping Use  . Vaping Use: Never used  Substance Use Topics  . Alcohol use: Never  . Drug use: No     Colonoscopy: 09/2013, with CT virtual 11/2013  PAP: none on file, s/p hysterectomy  Bone density: 04/2001, -0.5   Allergies  Allergen Reactions  . Penicillins Hives    [place]   . Sulfa Antibiotics Rash    Current Outpatient Medications  Medication Sig Dispense Refill  . acetaminophen (TYLENOL) 650 MG CR tablet Take 650 mg by mouth every 8 (eight) hours as needed for pain.    Marland Kitchen aspirin EC 81 MG tablet Take 81 mg by mouth daily.    Marland Kitchen atorvastatin (LIPITOR) 20 MG tablet Take 20 mg by mouth daily.    .  calcium citrate-vitamin D (CITRACAL+D) 315-200 MG-UNIT tablet Take 1 tablet by mouth 2 (two) times daily.    . Cholecalciferol (VITAMIN D) 2000 UNITS tablet Take 2,000 Units by mouth daily.    Marland Kitchen co-enzyme Q-10 30 MG capsule Take 30 mg by mouth 3 (three) times daily.     Marland Kitchen esomeprazole (NEXIUM) 20 MG capsule Take 20 mg by mouth daily at 12 noon.    . famotidine (PEPCID) 20 MG tablet Take 20 mg by mouth daily as needed for heartburn or indigestion.     . ferrous sulfate 324 MG TBEC Take 324 mg by mouth daily with breakfast.    . FIBER PO Take 1 tablet by mouth daily.    Marland Kitchen gabapentin (NEURONTIN) 100 MG capsule Take 3 capsules (300 mg total) by mouth at bedtime. 90 capsule 4  . levothyroxine (SYNTHROID) 88 MCG tablet Take 88 mcg by mouth daily before breakfast.    . lidocaine-prilocaine (EMLA) cream Apply 1 application topically as needed. 30 g 0  . loratadine (CLARITIN) 10 MG tablet Take 1 tablet (10 mg total) by mouth daily. 60 tablet 1  . Multiple Vitamin (MULTIVITAMIN WITH MINERALS) TABS tablet Take 1 tablet by mouth daily.    . nabumetone (RELAFEN) 500 MG tablet Take 500 mg by mouth 2 (two) times daily.    . Omega-3 Fatty Acids (FISH OIL) 1200 MG CAPS Take 1,200 mg by mouth daily.    Marland Kitchen spironolactone (ALDACTONE) 25 MG tablet Take 0.5 tablets (12.5 mg total) by mouth daily. 15 tablet 3   Current Facility-Administered Medications  Medication Dose Route Frequency Provider Last Rate Last Admin  . sodium chloride flush (NS) 0.9 % injection 10 mL  10 mL Intracatheter PRN Remie Mathison, Virgie Dad, MD   10 mL at 10/27/20 1514    OBJECTIVE: White woman who appears stated age Vitals:   10/27/20 1331  BP: (!) 145/50  Pulse: 66  Resp: 18  Temp: (!) 97.5 F (36.4 C)  SpO2: 98%     Body mass index is 34.82 kg/m.   Wt Readings from Last 3 Encounters:  10/27/20 178 lb 4.8 oz (80.9 kg)  10/06/20 178 lb 3.2 oz (80.8 kg)  09/28/20 177 lb 12.8 oz (80.6 kg)  ECOG FS:1 - Symptomatic but completely  ambulatory  Sclerae unicteric, EOMs intact Wearing a mask No cervical or supraclavicular adenopathy Lungs no rales or rhonchi Heart regular rate and rhythm Abd soft, nontender, positive bowel sounds MSK no focal spinal tenderness, no upper  extremity lymphedema Neuro: nonfocal, well oriented, appropriate affect Breasts: The right breast is benign.  The left breast is status post lumpectomy and radiation.  There is no evidence of local recurrence.  Both axillae are benign.   LAB RESULTS:  CMP     Component Value Date/Time   NA 142 10/27/2020 1234   K 4.2 10/27/2020 1234   CL 108 10/27/2020 1234   CO2 26 10/27/2020 1234   GLUCOSE 86 10/27/2020 1234   BUN 16 10/27/2020 1234   CREATININE 0.75 10/27/2020 1234   CREATININE 0.79 09/28/2020 1335   CALCIUM 9.0 10/27/2020 1234   PROT 6.7 10/27/2020 1234   ALBUMIN 3.5 10/27/2020 1234   AST 53 (H) 10/27/2020 1234   AST 45 (H) 09/28/2020 1335   ALT 26 10/27/2020 1234   ALT 30 09/28/2020 1335   ALKPHOS 144 (H) 10/27/2020 1234   BILITOT 0.5 10/27/2020 1234   BILITOT 0.7 09/28/2020 1335   GFRNONAA >60 10/27/2020 1234   GFRNONAA >60 09/28/2020 1335   GFRAA >60 03/30/2020 0910   GFRAA >60 07/17/2019 0823    No results found for: TOTALPROTELP, ALBUMINELP, A1GS, A2GS, BETS, BETA2SER, GAMS, MSPIKE, SPEI  Lab Results  Component Value Date   WBC 4.3 10/27/2020   NEUTROABS 2.8 10/27/2020   HGB 11.6 (L) 10/27/2020   HCT 36.7 10/27/2020   MCV 98.1 10/27/2020   PLT 67 (L) 10/27/2020    No results found for: LABCA2  No components found for: RDEYCX448  No results for input(s): INR in the last 168 hours.  No results found for: LABCA2  No results found for: JEH631  No results found for: SHF026  No results found for: VZC588  No results found for: CA2729  No components found for: HGQUANT  No results found for: CEA1 / No results found for: CEA1   No results found for: AFPTUMOR  No results found for: CHROMOGRNA  No results  found for: KPAFRELGTCHN, LAMBDASER, KAPLAMBRATIO (kappa/lambda light chains)  No results found for: HGBA, HGBA2QUANT, HGBFQUANT, HGBSQUAN (Hemoglobinopathy evaluation)   No results found for: LDH  No results found for: IRON, TIBC, IRONPCTSAT (Iron and TIBC)  No results found for: FERRITIN  Urinalysis    Component Value Date/Time   COLORURINE AMBER (A) 09/28/2020 1309   APPEARANCEUR CLEAR 09/28/2020 1309   LABSPEC 1.023 09/28/2020 1309   PHURINE 5.0 09/28/2020 1309   GLUCOSEU NEGATIVE 09/28/2020 1309   HGBUR NEGATIVE 09/28/2020 1309   BILIRUBINUR NEGATIVE 09/28/2020 1309   KETONESUR 5 (A) 09/28/2020 1309   PROTEINUR NEGATIVE 09/28/2020 1309   NITRITE NEGATIVE 09/28/2020 1309   LEUKOCYTESUR LARGE (A) 09/28/2020 1309     STUDIES: No results found.   ELIGIBLE FOR AVAILABLE RESEARCH PROTOCOL: no  ASSESSMENT: 75 y.o. [place] woman status post left breast lower outer quadrant biopsy 07/09/2019 for a clinical T2 N1, stage IIIB invasive ductal carcinoma, grade 3, triple negative, with an MIB-1 of 60%  (a) chest CT scan and bone scan 07/31/2019 showed no evidence of metastatic disease  (b) MRI biopsy of 2 additional areas in the left breast (central, 07/10/2019, and at 8:30 o'clock on 07/26/2019) are both benign and concordant  (c) biopsy of 2 suspicious areas in the right breast on 07/26/2019 (9:00 and lower outer quadrant) were benign and concordant  (d) PET scan 11/21/2019 shows a 1.2 cm nodule in the left breast with an SUV max of 2.7, aortic calcification, but no lung liver or bone lesions, no evidence of metastases   (1) neoadjuvant chemotherapy  consisting of cyclophosphamide and doxorubicin in dose dense fashion x4 started 07/30/2019, completed 09/10/2019, followed by weekly carboplatin and paclitaxel x12 started 09/24/2019  (a) cycle 6 carboplatin/paclitaxel delayed 2 weeks because of neutropenia: granix added  (b) cycle 12 of carboplatin/paclitaxel omitted secondary to  persistent low counts  (2) left lumpectomy with targeted axillary lymph node dissection 01/16/2020 showed a ypTIc ypN1 invasive ductal carcinoma, grade 3  (a) a single axillary lymph node was removed  (b) repeat prognostic panel on the surgical specimen showed the tumor to be estrogen and progesterone receptor negative, MIB-110%, and HER-2 positive with a signals ratio of 2.97 and a copy number per cell 4.90.  (3) adjuvant radiation completed 04/01/2020.  (4) genetics testing 07/23/2019 through the STAT Breast cancer panel offered by [facility] found no deleterious mutations in ATM, BRCA1, BRCA2, CDH1, CHEK2, PALB2, PTEN, STK11 and TP53.  Additional testing through the Common Hereditary Cancers Panel offered by [facility] also found no deleterious mutations in IPC, ATM, AXIN2, BARD1, BMPR1A, BRCA1, BRCA2, BRIP1, CDH1, CDK4, CDKN2A (p14ARF), CDKN2A (p16INK4a), CHEK2, CTNNA1, DICER1, EPCAM (Deletion/duplication testing only), GREM1 (promoter region deletion/duplication testing only), KIT, MEN1, MLH1, MSH2, MSH3, MSH6, MUTYH, NBN, NF1, NHTL1, PALB2, PDGFRA, PMS2, POLD1, POLE, PTEN, RAD50, RAD51C, RAD51D, RNF43, SDHB, SDHC, SDHD, SMAD4, SMARCA4. STK11, TP53, TSC1, TSC2, and VHL.  The following genes were evaluated for sequence changes only: SDHA and HOXB13 c.251G>A variant only.   (a) 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   (5) adjuvant T-DM1/ Kadcyla started 02/18/2020, to be repeated Q3W x 14  (a) echo 02/12/2020 shows an ejection fraction in the 60-65% range  (b) echo 05/14/2020 shows an ejection fraction in the 55-60% range  (c) echo 08/10/2018 shows an ejection fraction in the 60 to 65% range   PLAN: Srinidhi is now close to a year out from definitive surgery for her breast cancer with no evidence of disease recurrence.  This is very favorable.  She is tolerating the D-DM1 well and she will complete her treatment in 3 weeks.    She is scheduled for a DEXA  scan in mid June.  Order for mammography has been placed.  She will see me after the DEXA scan and we will initiate long-term follow-up at that time  Total encounter time 20 minutes.  Virgie Dad. Devon Kingdon, MD 10/28/20 1:49 PM Medical Oncology and Hematology [facility] [place], [place] [address] Tel. [phone]    Fax. [phone]   I, Wilburn Mylar, am acting as scribe for Dr. Virgie Dad. Dalia Jollie.  I, Lurline Del MD, have reviewed the above documentation for accuracy and completeness, and I agree with the above.   *Total Encounter Time as defined by the Centers for Medicare and Medicaid Services includes, in addition to the face-to-face time of a patient visit (documented in the note above) non-face-to-face time: obtaining and reviewing outside history, ordering and reviewing medications, tests or procedures, care coordination (communications with other health care professionals or caregivers) and documentation in the medical record.

## 2020-10-27 ENCOUNTER — Encounter: Payer: Self-pay | Admitting: *Deleted

## 2020-10-27 ENCOUNTER — Inpatient Hospital Stay: Payer: Medicare Other

## 2020-10-27 ENCOUNTER — Other Ambulatory Visit: Payer: Self-pay

## 2020-10-27 ENCOUNTER — Other Ambulatory Visit: Payer: Medicare Other

## 2020-10-27 ENCOUNTER — Inpatient Hospital Stay (HOSPITAL_BASED_OUTPATIENT_CLINIC_OR_DEPARTMENT_OTHER): Payer: Medicare Other | Admitting: Oncology

## 2020-10-27 VITALS — BP 145/50 | HR 66 | Temp 97.5°F | Resp 18 | Wt 178.3 lb

## 2020-10-27 DIAGNOSIS — Z171 Estrogen receptor negative status [ER-]: Secondary | ICD-10-CM | POA: Diagnosis not present

## 2020-10-27 DIAGNOSIS — Z95828 Presence of other vascular implants and grafts: Secondary | ICD-10-CM

## 2020-10-27 DIAGNOSIS — K589 Irritable bowel syndrome without diarrhea: Secondary | ICD-10-CM | POA: Diagnosis not present

## 2020-10-27 DIAGNOSIS — C50512 Malignant neoplasm of lower-outer quadrant of left female breast: Secondary | ICD-10-CM | POA: Diagnosis not present

## 2020-10-27 DIAGNOSIS — Z5112 Encounter for antineoplastic immunotherapy: Secondary | ICD-10-CM | POA: Diagnosis not present

## 2020-10-27 DIAGNOSIS — E039 Hypothyroidism, unspecified: Secondary | ICD-10-CM | POA: Diagnosis not present

## 2020-10-27 DIAGNOSIS — J45909 Unspecified asthma, uncomplicated: Secondary | ICD-10-CM | POA: Diagnosis not present

## 2020-10-27 DIAGNOSIS — D696 Thrombocytopenia, unspecified: Secondary | ICD-10-CM | POA: Diagnosis not present

## 2020-10-27 LAB — COMPREHENSIVE METABOLIC PANEL
ALT: 26 U/L (ref 0–44)
AST: 53 U/L — ABNORMAL HIGH (ref 15–41)
Albumin: 3.5 g/dL (ref 3.5–5.0)
Alkaline Phosphatase: 144 U/L — ABNORMAL HIGH (ref 38–126)
Anion gap: 8 (ref 5–15)
BUN: 16 mg/dL (ref 8–23)
CO2: 26 mmol/L (ref 22–32)
Calcium: 9 mg/dL (ref 8.9–10.3)
Chloride: 108 mmol/L (ref 98–111)
Creatinine, Ser: 0.75 mg/dL (ref 0.44–1.00)
GFR, Estimated: >60 mL/min (ref >60)
Glucose, Bld: 86 mg/dL (ref 70–99)
Potassium: 4.2 mmol/L (ref 3.5–5.1)
Sodium: 142 mmol/L (ref 135–145)
Total Bilirubin: 0.5 mg/dL (ref 0.3–1.2)
Total Protein: 6.7 g/dL (ref 6.5–8.1)

## 2020-10-27 LAB — CBC WITH DIFFERENTIAL/PLATELET
Abs Immature Granulocytes: 0 10*3/uL (ref 0.00–0.07)
Basophils Absolute: 0 10*3/uL (ref 0.0–0.1)
Basophils Relative: 1 %
Eosinophils Absolute: 0.1 10*3/uL (ref 0.0–0.5)
Eosinophils Relative: 3 %
HCT: 36.7 % (ref 36.0–46.0)
Hemoglobin: 11.6 g/dL — ABNORMAL LOW (ref 12.0–15.0)
Immature Granulocytes: 0 %
Lymphocytes Relative: 23 %
Lymphs Abs: 1 10*3/uL (ref 0.7–4.0)
MCH: 31 pg (ref 26.0–34.0)
MCHC: 31.6 g/dL (ref 30.0–36.0)
MCV: 98.1 fL (ref 80.0–100.0)
Monocytes Absolute: 0.4 10*3/uL (ref 0.1–1.0)
Monocytes Relative: 9 %
Neutro Abs: 2.8 10*3/uL (ref 1.7–7.7)
Neutrophils Relative %: 64 %
Platelets: 67 10*3/uL — ABNORMAL LOW (ref 150–400)
RBC: 3.74 MIL/uL — ABNORMAL LOW (ref 3.87–5.11)
RDW: 17.6 % — ABNORMAL HIGH (ref 11.5–15.5)
WBC: 4.3 10*3/uL (ref 4.0–10.5)
nRBC: 0 % (ref 0.0–0.2)

## 2020-10-27 MED ORDER — ACETAMINOPHEN 325 MG PO TABS
650.0000 mg | ORAL_TABLET | Freq: Once | ORAL | Status: AC
  Administered 2020-10-27: 650 mg via ORAL

## 2020-10-27 MED ORDER — SODIUM CHLORIDE 0.9 % IV SOLN
Freq: Once | INTRAVENOUS | Status: AC
Start: 2020-10-27 — End: 2020-10-27
  Filled 2020-10-27: qty 250

## 2020-10-27 MED ORDER — DIPHENHYDRAMINE HCL 25 MG PO CAPS
25.0000 mg | ORAL_CAPSULE | Freq: Once | ORAL | Status: AC
  Administered 2020-10-27: 25 mg via ORAL

## 2020-10-27 MED ORDER — DIPHENHYDRAMINE HCL 25 MG PO CAPS
ORAL_CAPSULE | ORAL | Status: AC
  Filled 2020-10-27: qty 1

## 2020-10-27 MED ORDER — ACETAMINOPHEN 325 MG PO TABS
ORAL_TABLET | ORAL | Status: AC
  Filled 2020-10-27: qty 2

## 2020-10-27 MED ORDER — TRASTUZUMAB-ANNS CHEMO 150 MG IV SOLR
6.0000 mg/kg | Freq: Once | INTRAVENOUS | Status: AC
  Administered 2020-10-27: 504 mg via INTRAVENOUS
  Filled 2020-10-27: qty 24

## 2020-10-27 MED ORDER — SODIUM CHLORIDE 0.9% FLUSH
10.0000 mL | INTRAVENOUS | Status: DC | PRN
  Administered 2020-10-27: 10 mL
  Filled 2020-10-27: qty 10

## 2020-10-27 MED ORDER — SODIUM CHLORIDE 0.9% FLUSH
10.0000 mL | Freq: Once | INTRAVENOUS | Status: AC
Start: 2020-10-27 — End: 2020-10-27
  Administered 2020-10-27: 10 mL
  Filled 2020-10-27: qty 10

## 2020-10-27 MED ORDER — HEPARIN SOD (PORK) LOCK FLUSH 100 UNIT/ML IV SOLN
500.0000 [IU] | Freq: Once | INTRAVENOUS | Status: AC | PRN
  Administered 2020-10-27: 500 [IU]
  Filled 2020-10-27: qty 5

## 2020-10-27 NOTE — Patient Instructions (Signed)
[facility] ONCOLOGY  Discharge Instructions: Thank you for choosing [facility] to provide your oncology and hematology care.   If you have a lab appointment with the [facility], please go directly to the [facility] and check in at the registration area.   Wear comfortable clothing and clothing appropriate for easy access to any Portacath or PICC line.   We strive to give you quality time with your provider. You may need to reschedule your appointment if you arrive late (15 or more minutes).  Arriving late affects you and other patients whose appointments are after yours.  Also, if you miss three or more appointments without notifying the office, you may be dismissed from the clinic at the provider's discretion.      For prescription refill requests, have your pharmacy contact our office and allow 72 hours for refills to be completed.    Today you received the following chemotherapy and/or immunotherapy agents: Kanjniti    To help prevent nausea and vomiting after your treatment, we encourage you to take your nausea medication as directed.  BELOW ARE SYMPTOMS THAT SHOULD BE REPORTED IMMEDIATELY: . *FEVER GREATER THAN 100.4 F (38 C) OR HIGHER . *CHILLS OR SWEATING . *NAUSEA AND VOMITING THAT IS NOT CONTROLLED WITH YOUR NAUSEA MEDICATION . *UNUSUAL SHORTNESS OF BREATH . *UNUSUAL BRUISING OR BLEEDING . *URINARY PROBLEMS (pain or burning when urinating, or frequent urination) . *BOWEL PROBLEMS (unusual diarrhea, constipation, pain near the anus) . TENDERNESS IN MOUTH AND THROAT WITH OR WITHOUT PRESENCE OF ULCERS (sore throat, sores in mouth, or a toothache) . UNUSUAL RASH, SWELLING OR PAIN  . UNUSUAL VAGINAL DISCHARGE OR ITCHING   Items with * indicate a potential emergency and should be followed up as soon as possible or go to the Emergency Department if any problems should occur.  Please show the CHEMOTHERAPY ALERT CARD or IMMUNOTHERAPY ALERT  CARD at check-in to the Emergency Department and triage nurse.  Should you have questions after your visit or need to cancel or reschedule your appointment, please contact [facility]  Dept: [phone]  and follow the prompts.  Office hours are 8:00 a.m. to 4:30 p.m. Monday - Friday. Please note that voicemails left after 4:00 p.m. may not be returned until the following business day.  We are closed weekends and major holidays. You have access to a nurse at all times for urgent questions. Please call the main number to the clinic Dept: [phone] and follow the prompts.   For any non-urgent questions, you may also contact your provider using MyChart. We now offer e-Visits for anyone 52 and older to request care online for non-urgent symptoms. For details visit [email or web address].   Also download the MyChart app! Go to the app store, search "MyChart", open the app, select [facility], and log in with your MyChart username and password.  Due to Covid, a mask is required upon entering the hospital/clinic. If you do not have a mask, one will be given to you upon arrival. For doctor visits, patients may have 1 support person aged 64 or older with them. For treatment visits, patients cannot have anyone with them due to current Covid guidelines and our immunocompromised population.

## 2020-11-02 ENCOUNTER — Encounter: Payer: Self-pay | Admitting: *Deleted

## 2020-11-10 NOTE — Progress Notes (Signed)
CARDIO-ONCOLOGY CLINIC NOTE  Referring Physician: Dr. Antoine Primas, MD as PCP - General (Internal Medicine)  HPI:  Julie Thompson is 75 y.o. female with left breast cancer referred by Dr. Jana Hakim for enrollment into the Cardio-Oncology program.  Has h/o hyperlipidemia, HTN and hypothyroidism. No known cardiac disease   Oncology history:   (1) status post left breast lower outer quadrant biopsy 07/09/2019 for a clinical T2 N1, stage IIIB invasive ductal carcinoma, grade 3, triple negative, with an MIB-1 of 60%             (a) chest CT scan and bone scan 07/31/2019 showed no evidence of metastatic disease             (b) PET scan 11/21/2019 shows a 1.2 cm nodule in the left breast with an SUV max of 2.7, aortic calcification, but no lung liver or bone lesions, no evidence of metastases  (2) treated with neoadjuvant chemotherapy consisting of cyclophosphamide and doxorubicin in dose dense fashion x4 started 07/30/2019, completed 09/10/2019, followed by weekly carboplatin and paclitaxel x12 started 09/24/2019             (a) cycle 6 carboplatin/paclitaxel delayed 2 weeks because of neutropenia: granix added             (b) cycle 12 of carboplatin/paclitaxel omitted secondary to persistent low counts  (3) left lumpectomy with targeted axillary lymph node dissection 01/16/2020 showed a ypTIc ypN1 invasive ductal carcinoma, grade 3             (a) a single axillary lymph node was removed             (b) repeat prognostic panel on the surgical specimen showed the tumor to be estrogen and progesterone receptor negative, MIB-110%, and HER-2 positive with a signals ratio of 2.97 and a copy number per cell 4.90.  (4) s/p XRT  (5) T-DM1/ Kadcyla started 02/18/2020, to be repeated Q3W x 14 weeks --> Finishes in June.            Here for routine f/u. Tolerating therapy well every 3 weeks. Finishes Herceptin next Tuesday. Denies edema, orthopnea or PND. Rare mild SOB  Echo today  11/11/20 EF 60-65% mild AI G2DD. Personally reviewed   Echo 08/2020 EF 60-65% mild AI GLS inaccurate due to poor windows.  Echo 05/14/20 EF 60-65% GLS -20.2% Personally reviewed Echo 02/12/20 EF 60-65% Grade I DD mild AoV thickening GLS -22.7% ECHO: 1/21 (pre-chemo):  EF 60-65% Grade I DD    Past Medical History:  Diagnosis Date  . Arthritis 2012  . Asthma    no problems now  . Family history of basal cell carcinoma   . Family history of breast cancer   . Family history of colon cancer   . Family history of kidney cancer   . Family history of leukemia   . Family history of peritoneal cancer   . GERD (gastroesophageal reflux disease)   . H/O hiatal hernia   . Hyperlipidemia   . Hypothyroidism   . IBS (irritable bowel syndrome)   . lt breast ca dx'd 07/10/19    Current Outpatient Medications  Medication Sig Dispense Refill  . acetaminophen (TYLENOL) 650 MG CR tablet Take 650 mg by mouth every 8 (eight) hours as needed for pain.    Marland Kitchen aspirin EC 81 MG tablet Take 81 mg by mouth daily.    Marland Kitchen atorvastatin (LIPITOR) 20 MG tablet Take 20 mg by mouth daily.    Marland Kitchen  calcium citrate-vitamin D (CITRACAL+D) 315-200 MG-UNIT tablet Take 1 tablet by mouth 2 (two) times daily.    . Cholecalciferol (VITAMIN D) 2000 UNITS tablet Take 2,000 Units by mouth daily.    Marland Kitchen co-enzyme Q-10 30 MG capsule Take 30 mg by mouth 3 (three) times daily.     Marland Kitchen esomeprazole (NEXIUM) 20 MG capsule Take 20 mg by mouth daily at 12 noon.    . famotidine (PEPCID) 20 MG tablet Take 20 mg by mouth daily as needed for heartburn or indigestion.     . ferrous sulfate 324 MG TBEC Take 324 mg by mouth daily with breakfast.    . FIBER PO Take 1 tablet by mouth daily.    Marland Kitchen gabapentin (NEURONTIN) 100 MG capsule Take 3 capsules (300 mg total) by mouth at bedtime. 90 capsule 4  . levothyroxine (SYNTHROID) 88 MCG tablet Take 88 mcg by mouth daily before breakfast.    . lidocaine-prilocaine (EMLA) cream Apply 1 application topically as  needed. 30 g 0  . loratadine (CLARITIN) 10 MG tablet Take 1 tablet (10 mg total) by mouth daily. 60 tablet 1  . Multiple Vitamin (MULTIVITAMIN WITH MINERALS) TABS tablet Take 1 tablet by mouth daily.    . nabumetone (RELAFEN) 500 MG tablet Take 500 mg by mouth 2 (two) times daily.    . Omega-3 Fatty Acids (FISH OIL) 1200 MG CAPS Take 1,200 mg by mouth daily.    Marland Kitchen spironolactone (ALDACTONE) 25 MG tablet Take 0.5 tablets (12.5 mg total) by mouth daily. 15 tablet 3   No current facility-administered medications for this encounter.    Allergies  Allergen Reactions  . Penicillins Hives    Whelps   . Sulfa Antibiotics Rash      Social History   Socioeconomic History  . Marital status: Married    Spouse name: Not on file  . Number of children: Not on file  . Years of education: Not on file  . Highest education level: Not on file  Occupational History  . Not on file  Tobacco Use  . Smoking status: Never Smoker  . Smokeless tobacco: Never Used  Vaping Use  . Vaping Use: Never used  Substance and Sexual Activity  . Alcohol use: Never  . Drug use: No  . Sexual activity: Not Currently    Birth control/protection: Surgical  Other Topics Concern  . Not on file  Social History Narrative  . Not on file   Social Determinants of Health   Financial Resource Strain: Not on file  Food Insecurity: Not on file  Transportation Needs: Not on file  Physical Activity: Not on file  Stress: Not on file  Social Connections: Not on file  Intimate Partner Violence: Not on file      Family History  Problem Relation Age of Onset  . Breast cancer Maternal Aunt        dx. in her 54s  . Cancer Mother 49       peritoneal cancer, death certificate lists ovarian cancer  . Colon cancer Paternal Uncle        dx. late 25s or early 72s  . Basal cell carcinoma Sister 42       a second diagnosed in her 30s  . Kidney cancer Maternal Grandmother 89  . Diabetes Paternal Grandmother   . Heart  Problems Paternal Grandmother   . Leukemia Paternal Uncle        dx. early 37s    Vitals:   11/11/20 1005  BP: 120/78  Pulse: 65  SpO2: 97%  Weight: 82 kg (180 lb 12.8 oz)    PHYSICAL EXAM: General:  Well appearing. No resp difficulty HEENT: normal Neck: supple. no JVD. Carotids 2+ bilat; no bruits. No lymphadenopathy or thryomegaly appreciated. Cor: PMI nondisplaced. Regular rate & rhythm. No rubs, gallops or murmurs. Lungs: clear Abdomen: obese soft, nontender, nondistended. No hepatosplenomegaly. No bruits or masses. Good bowel sounds. Extremities: no cyanosis, clubbing, rash, edema Neuro: alert & orientedx3, cranial nerves grossly intact. moves all 4 extremities w/o difficulty. Affect pleasant    ASSESSMENT & PLAN: 1. Left Breast Cancer - initial biopsy triple negative - treated 1/21 with 4 doses of doxorubicin-based regimen - s/p left lumpectomy 7/21 with positive margins which were HER-2+ - s/p XRT - Now on TDM-1 based therpy - Echo 8/21 EF 60-65% Grade I DD mild AoV thickening GLS -22.7% - Echo 11/21 EF 60-65% GLS -20.2% - Echo 2/22  EF 60-65% - Echo today 11/11/20 EF 60-65% mild AI G2DD. Personally reviewed - I reviewed echos personally. EF and Doppler parameters stable. No HF on exam. Finishes Herceptin next week. Can f/u as needed  2. HTN - Blood pressure well controlled. Continue current regimen.    Glori Bickers, MD  10:23 PM

## 2020-11-11 ENCOUNTER — Encounter (HOSPITAL_COMMUNITY): Payer: Self-pay | Admitting: Internal Medicine

## 2020-11-11 ENCOUNTER — Ambulatory Visit (HOSPITAL_BASED_OUTPATIENT_CLINIC_OR_DEPARTMENT_OTHER)
Admission: RE | Admit: 2020-11-11 | Discharge: 2020-11-11 | Disposition: A | Discharge status: Home / Self Care | Payer: Medicare Other | Source: Ambulatory Visit | Attending: Internal Medicine | Admitting: Internal Medicine

## 2020-11-11 ENCOUNTER — Other Ambulatory Visit: Payer: Self-pay

## 2020-11-11 ENCOUNTER — Ambulatory Visit (HOSPITAL_COMMUNITY)
Admission: RE | Admit: 2020-11-11 | Discharge: 2020-11-11 | Disposition: A | Discharge status: Home / Self Care | Payer: Medicare Other | Source: Ambulatory Visit | Attending: Internal Medicine | Admitting: Internal Medicine

## 2020-11-11 VITALS — BP 120/78 | HR 65 | Wt 180.8 lb

## 2020-11-11 DIAGNOSIS — Z803 Family history of malignant neoplasm of breast: Secondary | ICD-10-CM | POA: Diagnosis not present

## 2020-11-11 DIAGNOSIS — Z171 Estrogen receptor negative status [ER-]: Secondary | ICD-10-CM | POA: Diagnosis not present

## 2020-11-11 DIAGNOSIS — Z0189 Encounter for other specified special examinations: Secondary | ICD-10-CM | POA: Diagnosis not present

## 2020-11-11 DIAGNOSIS — C50512 Malignant neoplasm of lower-outer quadrant of left female breast: Secondary | ICD-10-CM | POA: Diagnosis not present

## 2020-11-11 DIAGNOSIS — I10 Essential (primary) hypertension: Secondary | ICD-10-CM

## 2020-11-11 DIAGNOSIS — Z9012 Acquired absence of left breast and nipple: Secondary | ICD-10-CM | POA: Diagnosis not present

## 2020-11-11 DIAGNOSIS — Z882 Allergy status to sulfonamides status: Secondary | ICD-10-CM | POA: Diagnosis not present

## 2020-11-11 DIAGNOSIS — Z7982 Long term (current) use of aspirin: Secondary | ICD-10-CM | POA: Insufficient documentation

## 2020-11-11 DIAGNOSIS — Z880 Allergy status to penicillin: Secondary | ICD-10-CM | POA: Insufficient documentation

## 2020-11-11 DIAGNOSIS — E039 Hypothyroidism, unspecified: Secondary | ICD-10-CM | POA: Insufficient documentation

## 2020-11-11 DIAGNOSIS — Z79899 Other long term (current) drug therapy: Secondary | ICD-10-CM | POA: Diagnosis not present

## 2020-11-11 DIAGNOSIS — Z923 Personal history of irradiation: Secondary | ICD-10-CM | POA: Insufficient documentation

## 2020-11-11 DIAGNOSIS — Z791 Long term (current) use of non-steroidal anti-inflammatories (NSAID): Secondary | ICD-10-CM | POA: Diagnosis not present

## 2020-11-11 DIAGNOSIS — E785 Hyperlipidemia, unspecified: Secondary | ICD-10-CM | POA: Insufficient documentation

## 2020-11-11 DIAGNOSIS — Z01818 Encounter for other preprocedural examination: Secondary | ICD-10-CM | POA: Insufficient documentation

## 2020-11-11 DIAGNOSIS — Z79890 Hormone replacement therapy (postmenopausal): Secondary | ICD-10-CM | POA: Insufficient documentation

## 2020-11-11 HISTORY — DX: Heart failure, unspecified: I50.9

## 2020-11-11 LAB — ECHOCARDIOGRAM COMPLETE
Area-P 1/2: 3.85 cm2
P 1/2 time: 441 msec
S' Lateral: 2.8 cm

## 2020-11-11 NOTE — Progress Notes (Signed)
  Echocardiogram 2D Echocardiogram has been performed.  Jennette Dubin 11/11/2020, 10:10 AM

## 2020-11-16 ENCOUNTER — Encounter: Payer: Self-pay | Admitting: *Deleted

## 2020-11-16 ENCOUNTER — Other Ambulatory Visit: Payer: Self-pay | Admitting: Oncology

## 2020-11-17 ENCOUNTER — Inpatient Hospital Stay: Payer: Medicare Other

## 2020-11-17 ENCOUNTER — Inpatient Hospital Stay: Payer: Medicare Other | Attending: Oncology

## 2020-11-17 ENCOUNTER — Other Ambulatory Visit: Payer: Self-pay

## 2020-11-17 VITALS — BP 134/50 | HR 67 | Temp 98.0°F | Wt 179.0 lb

## 2020-11-17 DIAGNOSIS — Z171 Estrogen receptor negative status [ER-]: Secondary | ICD-10-CM | POA: Insufficient documentation

## 2020-11-17 DIAGNOSIS — C773 Secondary and unspecified malignant neoplasm of axilla and upper limb lymph nodes: Secondary | ICD-10-CM | POA: Diagnosis not present

## 2020-11-17 DIAGNOSIS — Z7982 Long term (current) use of aspirin: Secondary | ICD-10-CM | POA: Diagnosis not present

## 2020-11-17 DIAGNOSIS — C50512 Malignant neoplasm of lower-outer quadrant of left female breast: Secondary | ICD-10-CM

## 2020-11-17 DIAGNOSIS — Z79899 Other long term (current) drug therapy: Secondary | ICD-10-CM | POA: Diagnosis not present

## 2020-11-17 DIAGNOSIS — Z5112 Encounter for antineoplastic immunotherapy: Secondary | ICD-10-CM | POA: Insufficient documentation

## 2020-11-17 LAB — COMPREHENSIVE METABOLIC PANEL
ALT: 26 U/L (ref 0–44)
AST: 36 U/L (ref 15–41)
Albumin: 3.3 g/dL — ABNORMAL LOW (ref 3.5–5.0)
Alkaline Phosphatase: 135 U/L — ABNORMAL HIGH (ref 38–126)
Anion gap: 10 (ref 5–15)
BUN: 15 mg/dL (ref 8–23)
CO2: 25 mmol/L (ref 22–32)
Calcium: 9.1 mg/dL (ref 8.9–10.3)
Chloride: 110 mmol/L (ref 98–111)
Creatinine, Ser: 0.76 mg/dL (ref 0.44–1.00)
GFR, Estimated: >60 mL/min (ref >60)
Glucose, Bld: 99 mg/dL (ref 70–99)
Potassium: 3.8 mmol/L (ref 3.5–5.1)
Sodium: 145 mmol/L (ref 135–145)
Total Bilirubin: 0.6 mg/dL (ref 0.3–1.2)
Total Protein: 6.3 g/dL — ABNORMAL LOW (ref 6.5–8.1)

## 2020-11-17 LAB — CBC WITH DIFFERENTIAL/PLATELET
Abs Immature Granulocytes: 0.01 10*3/uL (ref 0.00–0.07)
Basophils Absolute: 0 10*3/uL (ref 0.0–0.1)
Basophils Relative: 1 %
Eosinophils Absolute: 0.2 10*3/uL (ref 0.0–0.5)
Eosinophils Relative: 6 %
HCT: 36 % (ref 36.0–46.0)
Hemoglobin: 11.4 g/dL — ABNORMAL LOW (ref 12.0–15.0)
Immature Granulocytes: 0 %
Lymphocytes Relative: 24 %
Lymphs Abs: 0.8 10*3/uL (ref 0.7–4.0)
MCH: 31.2 pg (ref 26.0–34.0)
MCHC: 31.7 g/dL (ref 30.0–36.0)
MCV: 98.6 fL (ref 80.0–100.0)
Monocytes Absolute: 0.3 10*3/uL (ref 0.1–1.0)
Monocytes Relative: 9 %
Neutro Abs: 2.1 10*3/uL (ref 1.7–7.7)
Neutrophils Relative %: 60 %
Platelets: 67 10*3/uL — ABNORMAL LOW (ref 150–400)
RBC: 3.65 MIL/uL — ABNORMAL LOW (ref 3.87–5.11)
RDW: 17 % — ABNORMAL HIGH (ref 11.5–15.5)
WBC: 3.4 10*3/uL — ABNORMAL LOW (ref 4.0–10.5)
nRBC: 0 % (ref 0.0–0.2)

## 2020-11-17 MED ORDER — ACETAMINOPHEN 325 MG PO TABS
650.0000 mg | ORAL_TABLET | Freq: Once | ORAL | Status: AC
  Administered 2020-11-17: 650 mg via ORAL

## 2020-11-17 MED ORDER — TRASTUZUMAB-ANNS CHEMO 150 MG IV SOLR
6.0000 mg/kg | Freq: Once | INTRAVENOUS | Status: AC
  Administered 2020-11-17: 504 mg via INTRAVENOUS
  Filled 2020-11-17: qty 24

## 2020-11-17 MED ORDER — ACETAMINOPHEN 325 MG PO TABS
ORAL_TABLET | ORAL | Status: AC
  Filled 2020-11-17: qty 2

## 2020-11-17 MED ORDER — SODIUM CHLORIDE 0.9 % IV SOLN
Freq: Once | INTRAVENOUS | Status: AC
  Filled 2020-11-17: qty 250

## 2020-11-17 MED ORDER — HEPARIN SOD (PORK) LOCK FLUSH 100 UNIT/ML IV SOLN
500.0000 [IU] | Freq: Once | INTRAVENOUS | Status: AC | PRN
  Administered 2020-11-17: 500 [IU]
  Filled 2020-11-17: qty 5

## 2020-11-17 MED ORDER — DIPHENHYDRAMINE HCL 25 MG PO CAPS
ORAL_CAPSULE | ORAL | Status: AC
  Filled 2020-11-17: qty 1

## 2020-11-17 MED ORDER — DIPHENHYDRAMINE HCL 25 MG PO CAPS
25.0000 mg | ORAL_CAPSULE | Freq: Once | ORAL | Status: AC
  Administered 2020-11-17: 25 mg via ORAL

## 2020-11-17 MED ORDER — SODIUM CHLORIDE 0.9% FLUSH
10.0000 mL | INTRAVENOUS | Status: DC | PRN
  Administered 2020-11-17: 10 mL
  Filled 2020-11-17: qty 10

## 2020-11-17 NOTE — Patient Instructions (Signed)
[facility] Discharge Instructions for Patients Receiving Chemotherapy  Today you received the following chemotherapy agents trastuzumab.  To help prevent nausea and vomiting after your treatment, we encourage you to take your nausea medication as directed.    If you develop nausea and vomiting that is not controlled by your nausea medication, call the clinic.   BELOW ARE SYMPTOMS THAT SHOULD BE REPORTED IMMEDIATELY:  *FEVER GREATER THAN 100.5 F  *CHILLS WITH OR WITHOUT FEVER  NAUSEA AND VOMITING THAT IS NOT CONTROLLED WITH YOUR NAUSEA MEDICATION  *UNUSUAL SHORTNESS OF BREATH  *UNUSUAL BRUISING OR BLEEDING  TENDERNESS IN MOUTH AND THROAT WITH OR WITHOUT PRESENCE OF ULCERS  *URINARY PROBLEMS  *BOWEL PROBLEMS  UNUSUAL RASH Items with * indicate a potential emergency and should be followed up as soon as possible.  Feel free to call the clinic should you have any questions or concerns. The clinic phone number is [phone].  Please show the CHEMO ALERT CARD at check-in to the Emergency Department and triage nurse.   

## 2020-11-17 NOTE — Progress Notes (Signed)
Per Dr. Jana Hakim, ok to treat with platelets.

## 2020-11-25 DIAGNOSIS — E7800 Pure hypercholesterolemia, unspecified: Secondary | ICD-10-CM | POA: Diagnosis not present

## 2020-11-25 DIAGNOSIS — M170 Bilateral primary osteoarthritis of knee: Secondary | ICD-10-CM | POA: Diagnosis not present

## 2020-11-25 DIAGNOSIS — G43009 Migraine without aura, not intractable, without status migrainosus: Secondary | ICD-10-CM | POA: Diagnosis not present

## 2020-11-25 DIAGNOSIS — E039 Hypothyroidism, unspecified: Secondary | ICD-10-CM | POA: Diagnosis not present

## 2020-12-16 DIAGNOSIS — M1712 Unilateral primary osteoarthritis, left knee: Secondary | ICD-10-CM | POA: Diagnosis not present

## 2020-12-16 DIAGNOSIS — M1711 Unilateral primary osteoarthritis, right knee: Secondary | ICD-10-CM | POA: Diagnosis not present

## 2020-12-21 ENCOUNTER — Ambulatory Visit
Admission: RE | Admit: 2020-12-21 | Discharge: 2020-12-21 | Disposition: A | Discharge status: Home / Self Care | Payer: Medicare Other | Source: Ambulatory Visit | Attending: Internal Medicine | Admitting: Internal Medicine

## 2020-12-21 ENCOUNTER — Other Ambulatory Visit: Payer: Self-pay

## 2020-12-21 DIAGNOSIS — M85852 Other specified disorders of bone density and structure, left thigh: Secondary | ICD-10-CM | POA: Diagnosis not present

## 2020-12-21 DIAGNOSIS — Z780 Asymptomatic menopausal state: Secondary | ICD-10-CM | POA: Diagnosis not present

## 2020-12-21 DIAGNOSIS — M85859 Other specified disorders of bone density and structure, unspecified thigh: Secondary | ICD-10-CM

## 2020-12-22 ENCOUNTER — Ambulatory Visit
Admission: RE | Admit: 2020-12-22 | Discharge: 2020-12-22 | Disposition: A | Discharge status: Home / Self Care | Payer: Medicare Other | Source: Ambulatory Visit | Attending: Oncology | Admitting: Oncology

## 2020-12-22 DIAGNOSIS — C50512 Malignant neoplasm of lower-outer quadrant of left female breast: Secondary | ICD-10-CM

## 2020-12-22 DIAGNOSIS — R922 Inconclusive mammogram: Secondary | ICD-10-CM | POA: Diagnosis not present

## 2020-12-22 HISTORY — DX: Personal history of irradiation: Z92.3

## 2020-12-22 HISTORY — DX: Personal history of antineoplastic chemotherapy: Z92.21

## 2020-12-27 IMAGING — MG MM BREAST LOCALIZATION CLIP
4 series · 4 of 12 positions shown · non-contrast
Comparison: Previous exam(s).

CLINICAL DATA: Patient underwent MRI guided biopsy on 07/26/2019
for 2 areas in the right breast and a single area in the left
breast. Patient has biopsy-proven left breast malignancy.

EXAM:
DIAGNOSTIC BILATERAL MAMMOGRAM POST MRI BIOPSY

[R ML synth-2D]
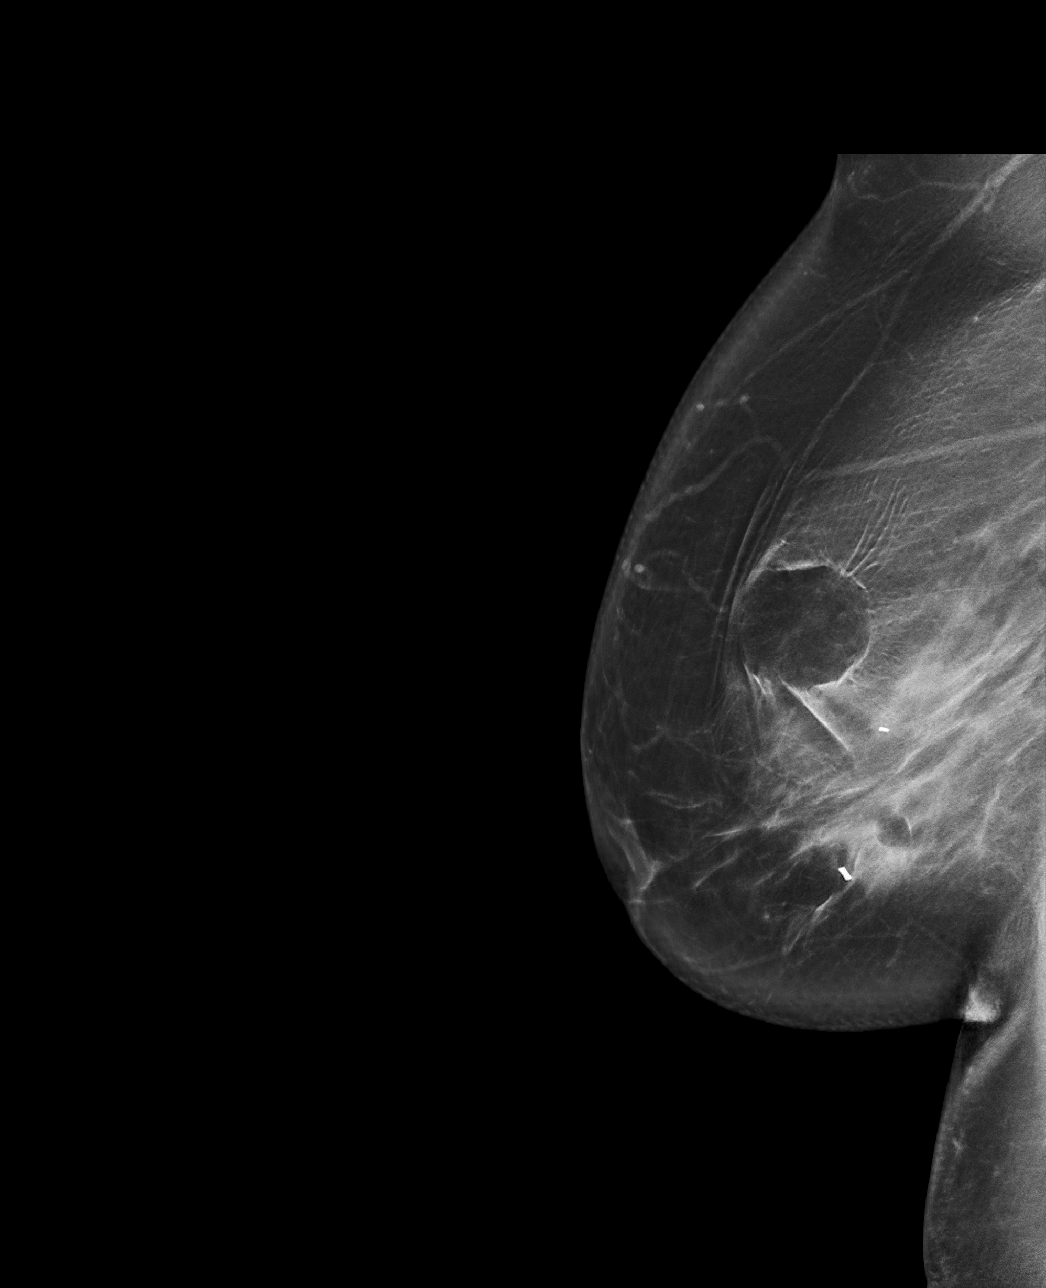

[R CC synth-2D]
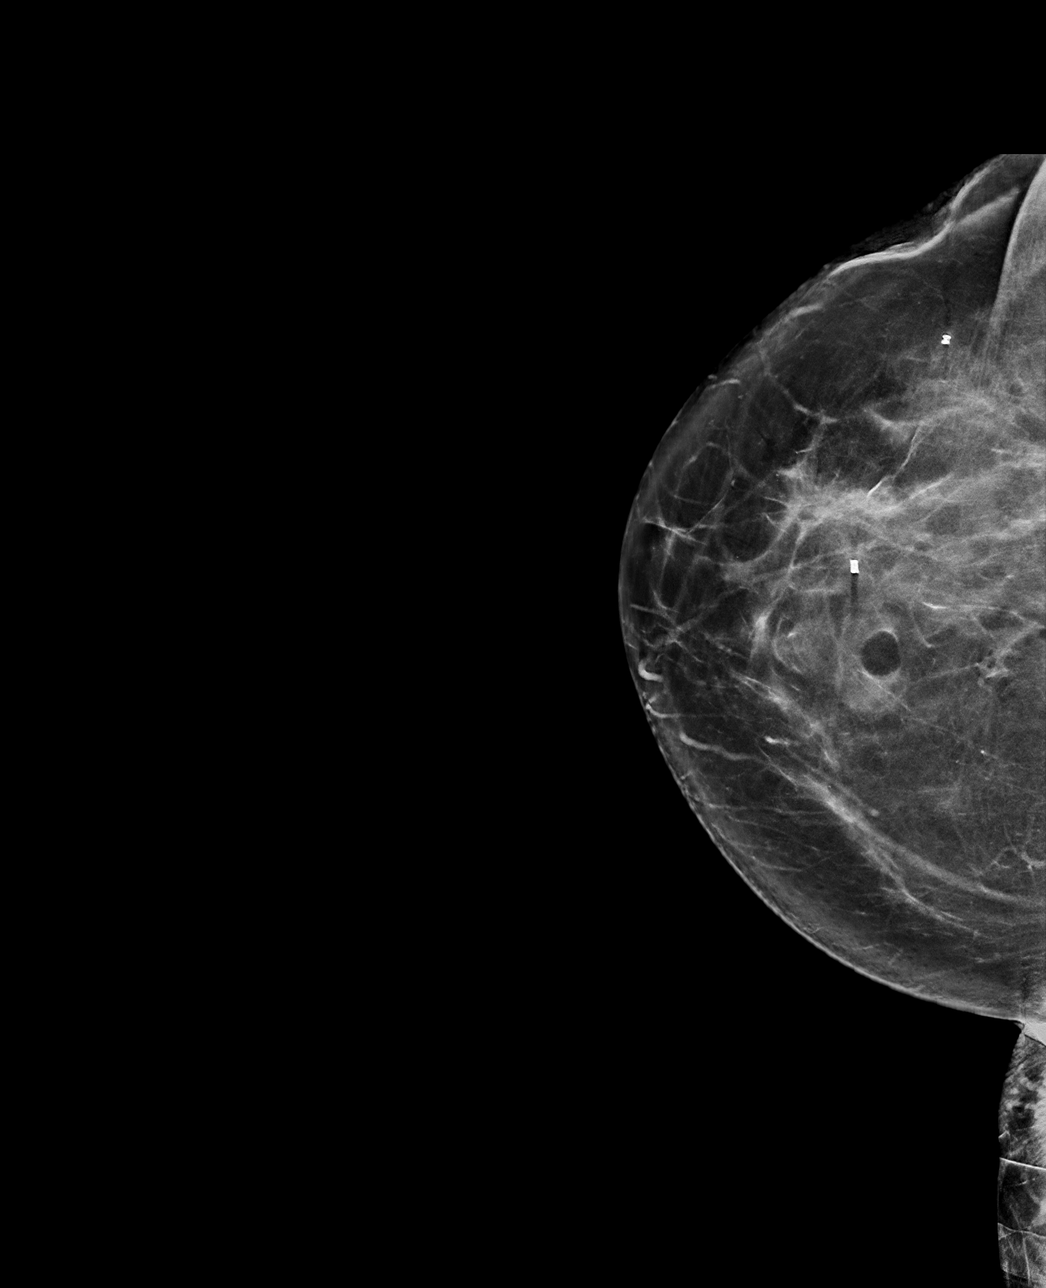

[R ML tomo · tomo slice 48/95.0]
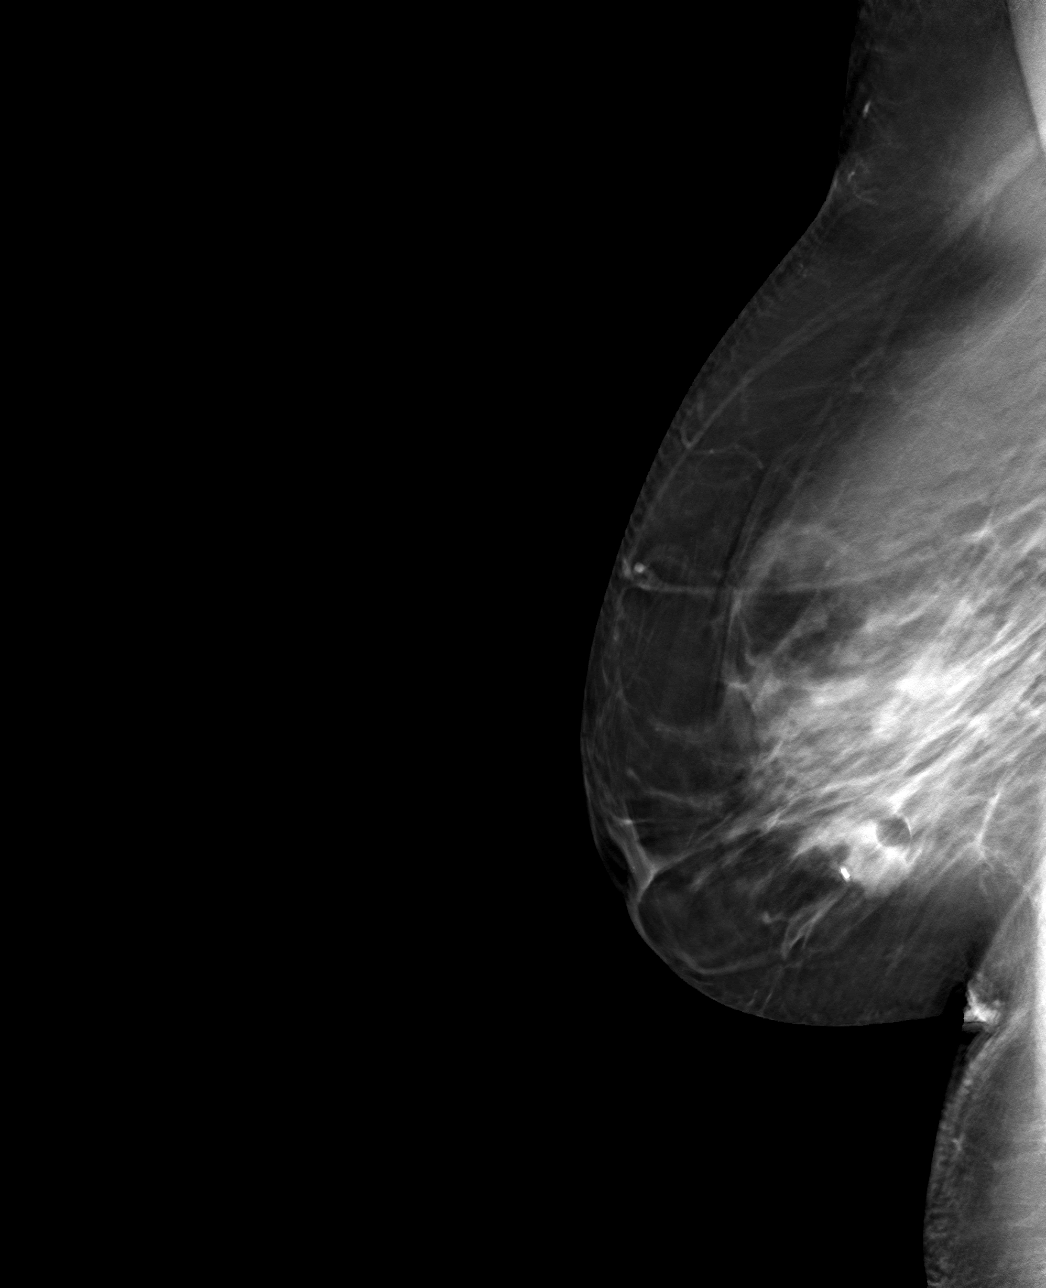

[R CC tomo · tomo slice 41/82.0]
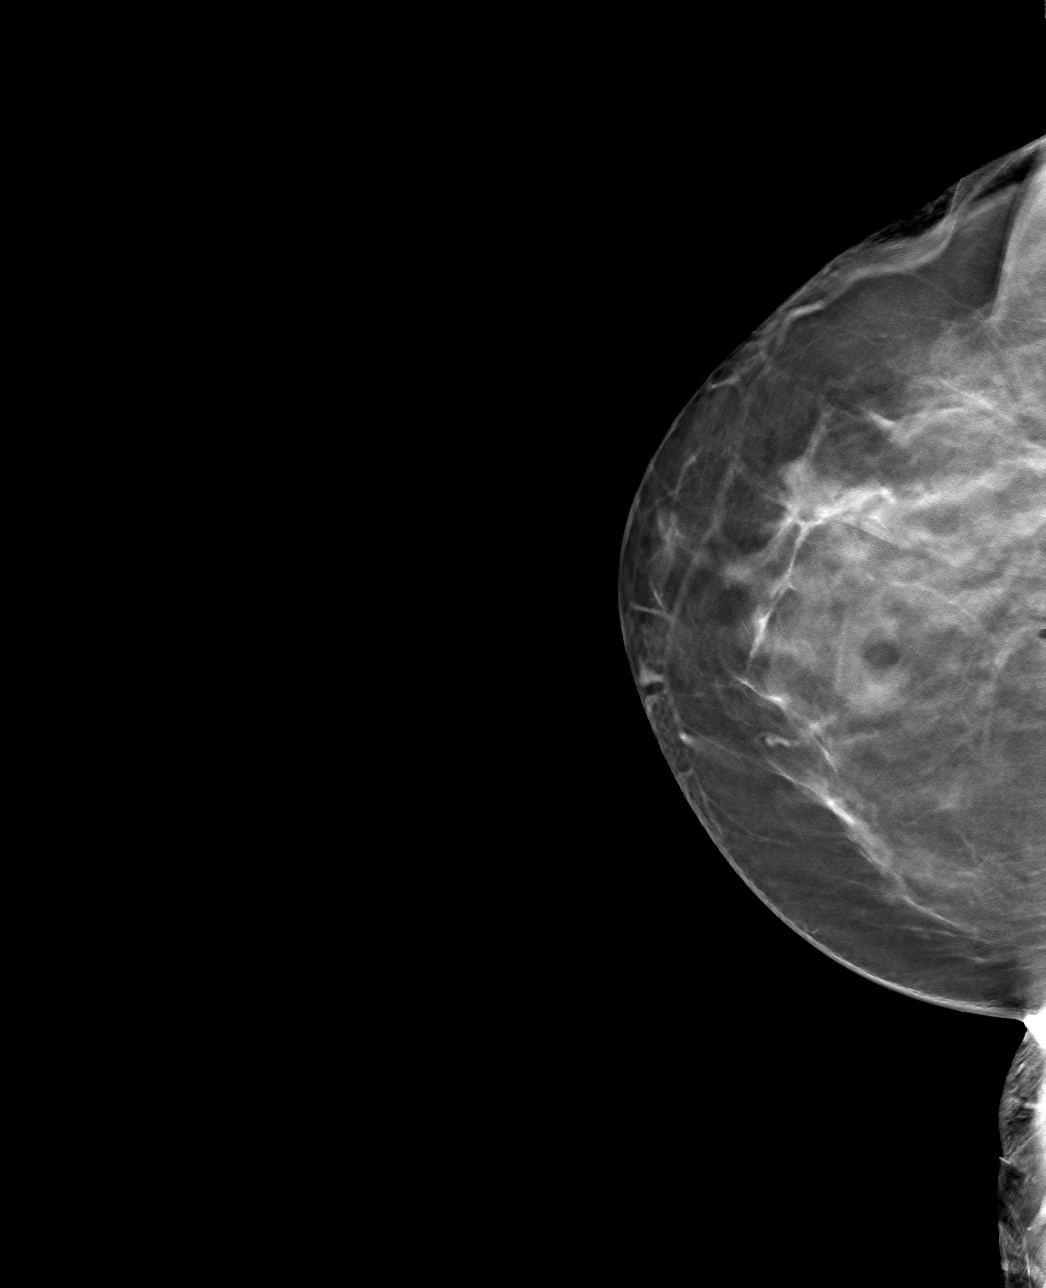

[4 of 12 positions shown; findings below may reference images not displayed]

FINDINGS: Mammographic images were obtained following MRI guided biopsy of 2
lesions in the right breast and a single additional lesion in the
left breast. The left breast biopsy marker clip, which is a dumbbell
clip, lies in the medial, slightly inferior aspect of the left
breast, anterior to the 2 X shaped biopsy clips and the more
posterior ribbon shaped biopsy clip, the latter localizing the known
breast carcinoma. On the right, the cylinder shaped biopsy clip lies
inferiorly and the dumbbell shaped biopsy clip lies laterally in the
expected location of the MRI lesions.
IMPRESSION: Appropriate positioning of the bilateral dumbbell shaped shaped and
right breast cylinder shaped biopsy marking clips at the site of
biopsy in the right breast inferiorly and laterally and the left
breast anterior lower inner quadrant.

Final Assessment: Post Procedure Mammograms for Marker Placement

## 2020-12-27 IMAGING — CR DG CHEST 1V PORT
1 series · 1 of 1 positions shown · non-contrast
Comparison: None.

CLINICAL DATA: Port-A-Cath placement.  Breast cancer

EXAM:
PORTABLE CHEST 1 VIEW

[chest ap]
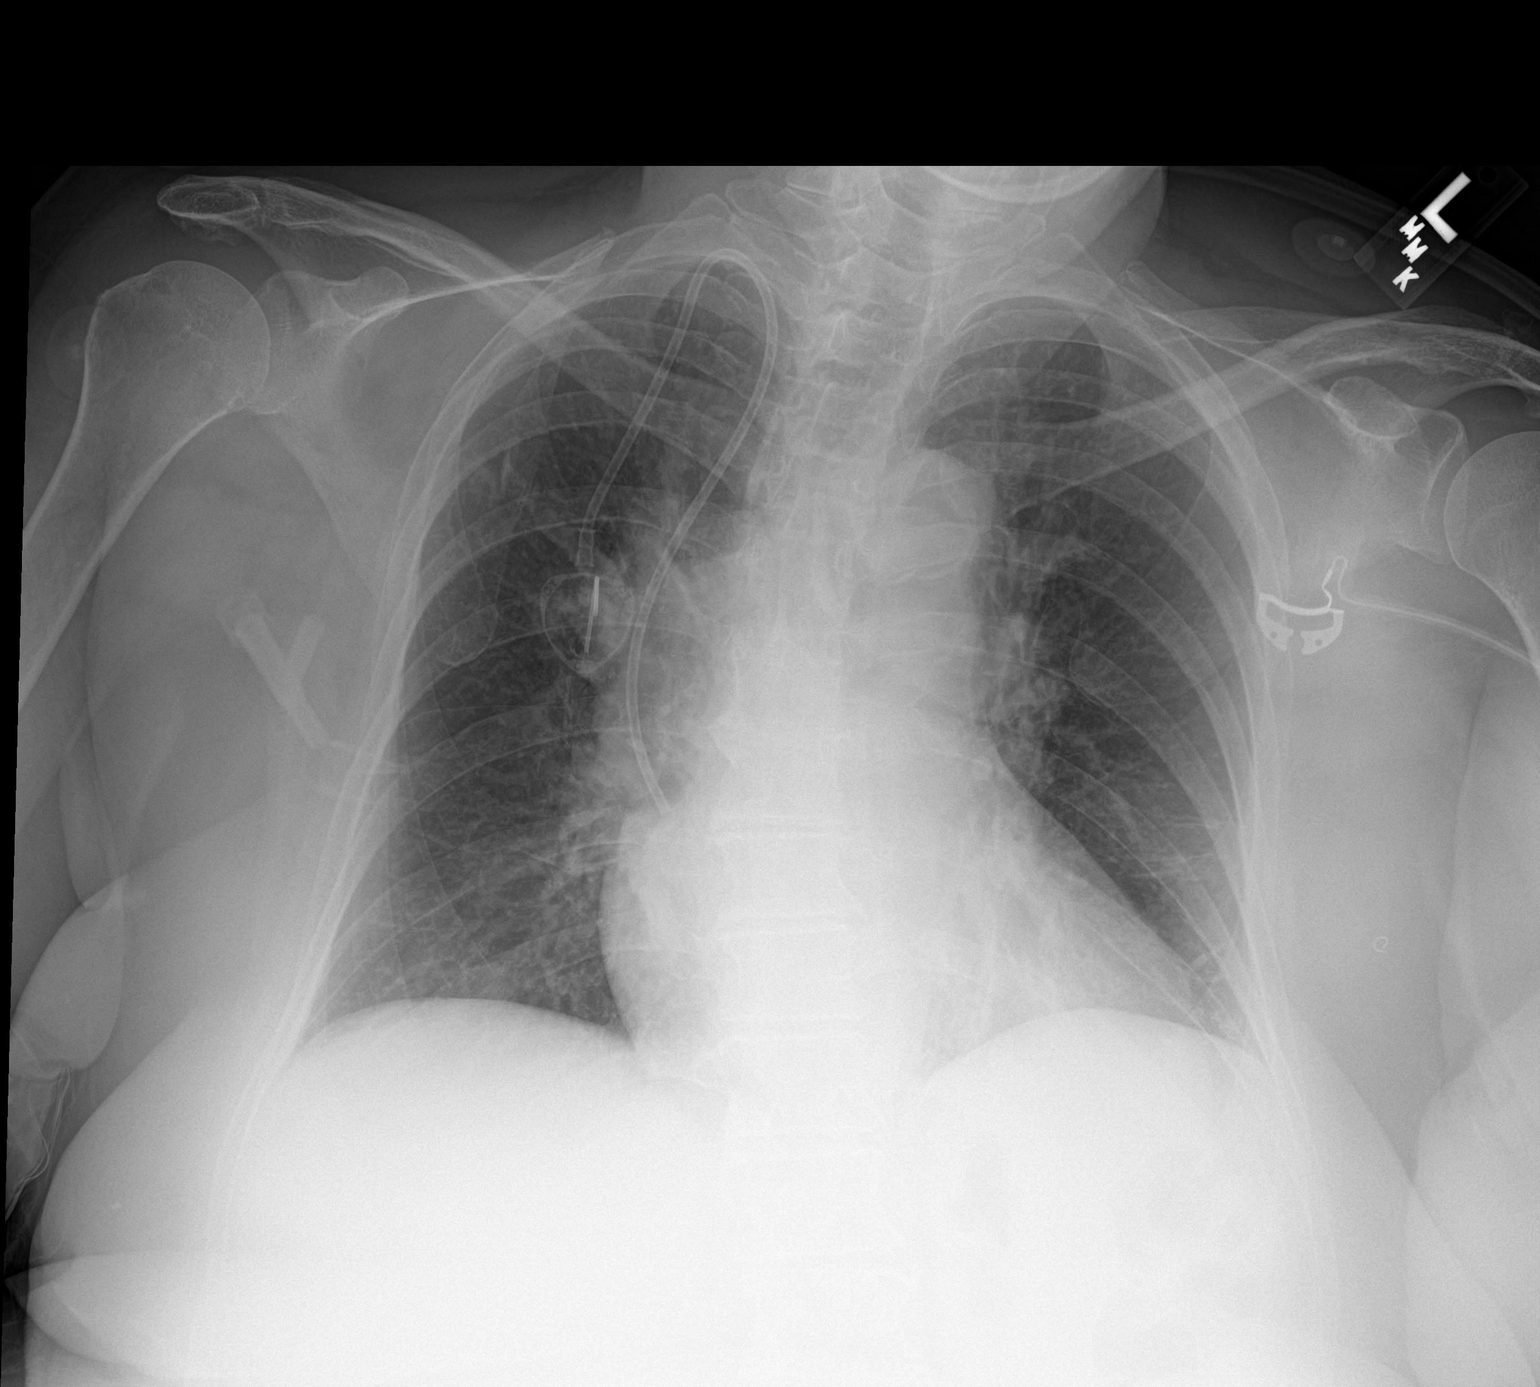

[1 of 1 positions shown; findings below may reference images not displayed]

FINDINGS: Right jugular Port-A-Cath tip at the cavoatrial junction. No
pneumothorax

Asymmetric density right hilum possibly due to mass or adenopathy.
There is dextroscoliosis which could be affecting evaluation of the
hilum. Nevertheless, recommend further evaluation with CT chest with
contrast.

Negative for heart failure infiltrate or effusion. Lungs well
aerated
IMPRESSION: Satisfactory Port-A-Cath placement

Possible right hilar mass/adenopathy. Recommend CT chest with
contrast.

## 2020-12-27 NOTE — Progress Notes (Signed)
[place]  [phone] Fax:([phone]) [phone]     ID: [name] DOB: 1946/05/19  MR#: [id number]  FWY#:[id number]  Patient Care Team: Leeroy Cha, MD as PCP - General (Internal Medicine) Rockwell Germany, RN as Oncology Nurse Navigator Mauro Kaufmann, RN as Oncology Nurse Navigator Eppie Gibson, MD as Attending Physician (Radiation Oncology) Donnie Mesa, MD as Consulting Physician (General Surgery) Nazaria Riesen, Virgie Dad, MD as Consulting Physician (Oncology) Melrose Nakayama, MD as Consulting Physician (Orthopedic Surgery) Bensimhon, Shaune Pascal, MD as Consulting Physician (Cardiology) Chauncey Cruel, MD OTHER MD:  CHIEF COMPLAINT: 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   She completed her adjuvant Kadcyla/ T-DM1 on 11/17/2020.  Since her last visit, she underwent repeat echocardiogram on 11/11/2020 showing an ejection fraction of 60-65%, which is her baseline.  Since her last visit, she underwent bone density screening on 12/21/2020 showing a T-score of -1.6, which is considered osteopenic.  She also underwent bilateral diagnostic mammography with tomography at [facility] on 12/22/2020 showing: breast density category B; no evidence of malignancy in either breast.    REVIEW OF SYSTEMS: Kassi is finally beginning to feel more normal she says.  She has more energy.  She is walking a little bit.  She is trying to watch her weight.  She would like her port removed.  Overall a detailed review of systems today was noncontributory   COVID 19 VACCINATION STATUS: Status post [place] x1, July 2021, no booster as of 08/04/2020    HISTORY OF CURRENT ILLNESS: From the original intake note:  Christinamarie Tall had routine screening mammography  on 06/21/2019 showing a possible abnormality in the left breast. She underwent left diagnostic mammography with tomography and left breast ultrasonography at [facility] on 07/01/2019 showing: breast density category C; either 2 adjacent masses or 1 complex dumbbell-shaped mass containing calcifications in the left breast at 6 o'clock, the largest of which measures 2.1 cm; chest wall invasion not excluded; 1-2 mm group of calcifications just anterior to the mass(es); single abnormal lymph node in the left axilla; vascular calcifications in anterior left breast.  Accordingly on 07/09/2019 she proceeded to biopsy of the left breast areas in question. The pathology from this procedure ([id number]) showed:  1. Left Breast, 5:30, posterior  - invasive ductal carcinoma with necrosis, grade 3  - Prognostic indicators significant for: estrogen receptor, 0% negative and progesterone receptor, 0% negative. Proliferation marker Ki67 at 60%. HER2 negative by immunohistochemistry (0). 2. Left Breast, 5:30, posterior  - invasive ductal carcinoma with necrosis, grade 3  - ductal carcinoma in situ, intermediate grade 3. Left Axilla, lymph node (1)  - metastatic carcinoma involving a lymph node  And additional biopsy of the anterior vascular calcifications in the left breast was performed on 07/10/2019. Pathology [id number]) showed: fibrocystic changes with calcifications; no malignancy identified.  This was felt to be concordant.  The patient's subsequent history is as detailed below.   PAST MEDICAL HISTORY: Past Medical History:  Diagnosis Date   Arthritis 2012   Asthma    no problems now   CHF (congestive heart failure) (HCC)    Family history of basal cell carcinoma    Family history of breast cancer    Family history of colon cancer    Family history of kidney cancer  Family history of leukemia    Family history of peritoneal cancer    GERD (gastroesophageal reflux disease)    H/O hiatal hernia     Hyperlipidemia    Hypothyroidism    IBS (irritable bowel syndrome)    lt breast ca dx'd 07/10/19   Personal history of chemotherapy    Personal history of radiation therapy     PAST SURGICAL HISTORY: Past Surgical History:  Procedure Laterality Date   ABDOMINAL HYSTERECTOMY     age 41   BREAST BIOPSY Left 07/09/2019   x3   BREAST BIOPSY Left 07/10/2019   BREAST BIOPSY Bilateral 07/26/2019   x2 on rt, 1 left   BREAST LUMPECTOMY Left 01/16/2020   BREAST LUMPECTOMY WITH RADIOACTIVE SEED AND AXILLARY LYMPH NODE DISSECTION Left 01/16/2020   Procedure: LEFT BREAST LUMPECTOMY WITH RADIOACTIVE SEED AND TARGETED AXILLARY LYMPH NODE DISSECTION;  Surgeon: Donnie Mesa, MD;  Location: [facility];  Service: General;  Laterality: Left;  LMA   BREAST SURGERY     DILATION AND CURETTAGE OF UTERUS     2 miscarriages age 43 & 71   [place] N/A 10/22/2013   Procedure: FLEXIBLE SIGMOIDOSCOPY;  Surgeon: Garlan Fair, MD;  Location: [facility];  Service: Endoscopy;  Laterality: N/A;   KNEE ARTHROSCOPY Left    PORT-A-CATH REMOVAL N/A 01/16/2020   Procedure: REMOVAL PORT-A-CATH;  Surgeon: Donnie Mesa, MD;  Location: [facility];  Service: General;  Laterality: N/A;   PORTACATH PLACEMENT Right 07/29/2019   Procedure: INSERTION PORT-A-CATH WITH ULTRASOUND;  Surgeon: Donnie Mesa, MD;  Location: [facility];  Service: General;  Laterality: Right;   PORTACATH PLACEMENT N/A 02/13/2020   Procedure: INSERTION PORT-A-CATH WITH ULTRASOUND GUIDANCE;  Surgeon: Donnie Mesa, MD;  Location: [facility];  Service: General;  Laterality: N/A;   TONSILLECTOMY     age 32    FAMILY HISTORY: Family History  Problem Relation Age of Onset   Breast cancer Maternal Aunt        dx. in her 44s   Cancer Mother 70       peritoneal cancer, death certificate lists ovarian cancer   Colon cancer Paternal Uncle        dx. late 31s or early 88s   Basal cell carcinoma Sister 41        a second diagnosed in her 48s   Kidney cancer Maternal Grandmother 71   Diabetes Paternal Grandmother    Heart Problems Paternal Grandmother    Leukemia Paternal Uncle        dx. early 78s  Patient's father is currently living at age 53 as of Aug 20, 2019. Patient's mother died from ovarian cancer at age 8. She was diagnosed at age 14. The patient reports breast cancer in a maternal aunt, diagnosed in her 70's. The patient also noted colon cancer in a paternal uncle and kidney cancer in her maternal grandmother.  She has 1 sister.   GYNECOLOGIC HISTORY:  No LMP recorded. Patient has had a hysterectomy. Menarche: [age] years old Age at first live birth: 75 years old Comfort P 2 LMP age 5 (with hysterectomy) Contraceptive: never used HRT used for 10-12 years  Hysterectomy? Yes, age 76 BSO? 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  Husband [name] is a retired Psychologist, prison and probation services. She lives at home with husband Butch. She has two children from a prior marriage. Son [name], age 62, works as  a Games developer in [place], [place]. Son [name], age 31, works as a Programme researcher, broadcasting/film/video in [place], [place]. The patient has 4 grandchildren and 4 great-grandchildren. She attends [employer].    ADVANCED DIRECTIVES: In the absence of any documentation to the contrary, the patient's spouse is their HCPOA.    HEALTH MAINTENANCE: Social History   Tobacco Use   Smoking status: Never   Smokeless tobacco: Never  Vaping Use   Vaping Use: Never used  Substance Use Topics   Alcohol use: Never   Drug use: No     Colonoscopy: 09/2013, with CT virtual 11/2013  PAP: none on file, s/p hysterectomy  Bone density: 04/2001, -0.5   Allergies  Allergen Reactions   Penicillins Hives    Whelps    Sulfa Antibiotics Rash    Current Outpatient Medications  Medication Sig Dispense Refill   acetaminophen (TYLENOL) 650 MG CR tablet Take  650 mg by mouth every 8 (eight) hours as needed for pain.     aspirin EC 81 MG tablet Take 81 mg by mouth daily.     atorvastatin (LIPITOR) 20 MG tablet Take 20 mg by mouth daily.     calcium citrate-vitamin D (CITRACAL+D) 315-200 MG-UNIT tablet Take 1 tablet by mouth 2 (two) times daily.     Cholecalciferol (VITAMIN D) 2000 UNITS tablet Take 2,000 Units by mouth daily.     co-enzyme Q-10 30 MG capsule Take 30 mg by mouth 3 (three) times daily.      esomeprazole (NEXIUM) 20 MG capsule Take 20 mg by mouth daily at 12 noon.     famotidine (PEPCID) 20 MG tablet Take 20 mg by mouth daily as needed for heartburn or indigestion.      ferrous sulfate 324 MG TBEC Take 324 mg by mouth daily with breakfast.     FIBER PO Take 1 tablet by mouth daily.     gabapentin (NEURONTIN) 100 MG capsule TAKE 3 CAPSULES(300 MG) BY MOUTH AT BEDTIME 90 capsule 4   levothyroxine (SYNTHROID) 88 MCG tablet Take 88 mcg by mouth daily before breakfast.     lidocaine-prilocaine (EMLA) cream Apply 1 application topically as needed. 30 g 0   loratadine (CLARITIN) 10 MG tablet Take 1 tablet (10 mg total) by mouth daily. 60 tablet 1   Multiple Vitamin (MULTIVITAMIN WITH MINERALS) TABS tablet Take 1 tablet by mouth daily.     nabumetone (RELAFEN) 500 MG tablet Take 500 mg by mouth 2 (two) times daily.     Omega-3 Fatty Acids (FISH OIL) 1200 MG CAPS Take 1,200 mg by mouth daily.     spironolactone (ALDACTONE) 25 MG tablet Take 0.5 tablets (12.5 mg total) by mouth daily. 15 tablet 3   No current facility-administered medications for this visit.    OBJECTIVE: White woman  Vitals:   12/28/20 1133  BP: (!) 141/82  Pulse: (!) 58  Resp: 18  Temp: 97.7 F (36.5 C)  SpO2: 99%     Body mass index is 35.41 kg/m.   Wt Readings from Last 3 Encounters:  12/28/20 181 lb 4.8 oz (82.2 kg)  11/17/20 179 lb (81.2 kg)  11/11/20 180 lb 12.8 oz (82 kg)  ECOG FS:1 - Symptomatic but completely ambulatory  Sclerae unicteric, EOMs  intact Wearing a mask No cervical or supraclavicular adenopathy Lungs no rales or rhonchi Heart regular rate and rhythm Abd soft, nontender, positive bowel sounds MSK no focal spinal tenderness, no upper extremity lymphedema Neuro: nonfocal, well oriented, appropriate affect Breasts:  Deferred   LAB RESULTS:  CMP     Component Value Date/Time   NA 142 12/28/2020 1115   K 4.4 12/28/2020 1115   CL 109 12/28/2020 1115   CO2 25 12/28/2020 1115   GLUCOSE 78 12/28/2020 1115   BUN 14 12/28/2020 1115   CREATININE 0.78 12/28/2020 1115   CREATININE 0.79 09/28/2020 1335   CALCIUM 8.8 (L) 12/28/2020 1115   PROT 6.3 (L) 12/28/2020 1115   ALBUMIN 3.2 (L) 12/28/2020 1115   AST 31 12/28/2020 1115   AST 45 (H) 09/28/2020 1335   ALT 23 12/28/2020 1115   ALT 30 09/28/2020 1335   ALKPHOS 108 12/28/2020 1115   BILITOT 0.5 12/28/2020 1115   BILITOT 0.7 09/28/2020 1335   GFRNONAA >60 12/28/2020 1115   GFRNONAA >60 09/28/2020 1335   GFRAA >60 03/30/2020 0910   GFRAA >60 07/17/2019 0823    No results found for: TOTALPROTELP, ALBUMINELP, A1GS, A2GS, BETS, BETA2SER, GAMS, MSPIKE, SPEI  Lab Results  Component Value Date   WBC 4.6 12/28/2020   NEUTROABS 3.1 12/28/2020   HGB 11.8 (L) 12/28/2020   HCT 35.8 (L) 12/28/2020   MCV 97.3 12/28/2020   PLT 73 (L) 12/28/2020    No results found for: LABCA2  No components found for: CHYIFO277  No results for input(s): INR in the last 168 hours.  No results found for: LABCA2  No results found for: AJO878  No results found for: MVE720  No results found for: NOB096  No results found for: CA2729  No components found for: HGQUANT  No results found for: CEA1 / No results found for: CEA1   No results found for: AFPTUMOR  No results found for: CHROMOGRNA  No results found for: KPAFRELGTCHN, LAMBDASER, KAPLAMBRATIO (kappa/lambda light chains)  No results found for: HGBA, HGBA2QUANT, HGBFQUANT, HGBSQUAN (Hemoglobinopathy evaluation)    No results found for: LDH  No results found for: IRON, TIBC, IRONPCTSAT (Iron and TIBC)  No results found for: FERRITIN  Urinalysis    Component Value Date/Time   COLORURINE AMBER (A) 09/28/2020 1309   APPEARANCEUR CLEAR 09/28/2020 1309   LABSPEC 1.023 09/28/2020 1309   PHURINE 5.0 09/28/2020 1309   GLUCOSEU NEGATIVE 09/28/2020 1309   HGBUR NEGATIVE 09/28/2020 1309   BILIRUBINUR NEGATIVE 09/28/2020 1309   KETONESUR 5 (A) 09/28/2020 1309   PROTEINUR NEGATIVE 09/28/2020 1309   NITRITE NEGATIVE 09/28/2020 1309   LEUKOCYTESUR LARGE (A) 09/28/2020 1309    STUDIES: DG BONE DENSITY (DXA)  Result Date: 12/21/2020 EXAM: DUAL X-RAY ABSORPTIOMETRY (DXA) FOR BONE MINERAL DENSITY IMPRESSION: Referring Physician:  Leeroy Cha Your patient completed a bone mineral density test using GE Lunar iDXA system (analysis version: 16). Technologist: [place] PATIENT: Name: Taresa, [place] Patient ID: [id number] Birth Date: September 16, 1945 Height: 59.0 in. Sex: Female Measured: 12/21/2020 Weight: 179.4 lbs. Indications: Advanced Age, Bilateral Ovariectomy (65.51), Breast Cancer History, Caucasian, Early Menopause (256.31), Estrogen Deficient, Hypothyroid, Hysterectomy, Levothyroxine, Postmenopausal, Secondary Osteoporosis, History of Fracture (Adult) (V15.51) Fractures: Treatments: Calcium (E943.0) ASSESSMENT: The BMD measured at Femur Neck Left is 0.814 g/cm2 with a T-score of -1.6. This patient is considered osteopenic/low bone mass according to [facility]) criteria. The quality of the exam is good. Site Region Measured Date Measured Age YA BMD Significant CHANGE T-score DualFemur Neck Left  12/21/2020    74.6         -1.6    0.814 g/cm2 AP Spine  L1-L4      12/21/2020    74.6         -  0.8    1.087 g/cm2 DualFemur Total Mean 12/21/2020    74.6         -1.4    0.836 g/cm2 World Health Organization [facility]) criteria for post-menopausal, Caucasian Women: Normal       T-score at or above -1 SD  Osteopenia   T-score between -1 and -2.5 SD Osteoporosis T-score at or below -2.5 SD RECOMMENDATION: 1. All patients should optimize calcium and vitamin D intake. 2. Consider FDA-approved medical therapies in postmenopausal women and men aged 80 years and older, based on the following: a. A hip or vertebral (clinical or morphometric) fracture. b. T-score = -2.5 at the femoral neck or spine after appropriate evaluation to exclude secondary causes. c. Low bone mass (T-score between -1.0 and -2.5 at the femoral neck or spine) and a 10-year probability of a hip fracture = 3% or a 10-year probability of a major osteoporosis-related fracture = 20% based on the [place]-adapted WHO algorithm. d. Clinician judgment and/or patient preferences may indicate treatment for people with 10-year fracture probabilities above or below these levels. FOLLOW-UP: Patients with diagnosis of osteoporosis or at high risk for fracture should have regular bone mineral density tests.? Patients eligible for Medicare are allowed routine testing every 2 years.? The testing frequency can be increased to one year for patients who have rapidly progressing disease, are receiving or discontinuing medical therapy to restore bone mass, or have additional risk factors. I have reviewed this study and agree with the findings. [facility] FRAX* 10-year Probability of Fracture Based on femoral neck BMD: DualFemur (Left) Major Osteoporotic Fracture: 16.1% Hip Fracture:                3.0% Population:                  [place] (Caucasian) Risk Factors: Secondary Osteoporosis, History of Fracture (Adult) (V15.51) *FRAX is a Materials engineer of the [employer] of [employer] for Metabolic Bone Disease, a [place] (WHO) [facility]. ASSESSMENT: The probability of a major osteoporotic fracture is 16.1% within the next ten years. The probability of a hip fracture is 3.0% within the next ten years. Electronically Signed    By: Lajean Manes M.D.   On: 12/21/2020 21:38   MM DIAG BREAST TOMO BILATERAL  Result Date: 12/22/2020 CLINICAL DATA:  Left lumpectomy.  Annual mammography. EXAM: DIGITAL DIAGNOSTIC BILATERAL MAMMOGRAM WITH TOMOSYNTHESIS AND CAD TECHNIQUE: Bilateral digital diagnostic mammography and breast tomosynthesis was performed. The images were evaluated with computer-aided detection. COMPARISON:  Previous exam(s). ACR Breast Density Category b: There are scattered areas of fibroglandular density. FINDINGS: The left lumpectomy site appears as expected. No suspicious masses, calcifications, or distortion are identified in either breast. IMPRESSION: No mammographic evidence of malignancy. RECOMMENDATION: Annual diagnostic mammography. I have discussed the findings and recommendations with the patient. If applicable, a reminder letter will be sent to the patient regarding the next appointment. BI-RADS CATEGORY  2: Benign. Electronically Signed   By: Dorise Bullion III M.D   On: 12/22/2020 16:18    ELIGIBLE FOR AVAILABLE RESEARCH PROTOCOL: no  ASSESSMENT: 75 y.o. [place] woman status post left breast lower outer quadrant biopsy 07/09/2019 for a clinical T2 N1, stage IIIB invasive ductal carcinoma, grade 3, triple negative, with an MIB-1 of 60%  (a) chest CT scan and bone scan 07/31/2019 showed no evidence of metastatic disease  (b) MRI biopsy of 2 additional areas in the left breast (central, 07/10/2019, and at 8:30 o'clock on  07/26/2019) are both benign and concordant  (c) biopsy of 2 suspicious areas in the right breast on 07/26/2019 (9:00 and lower outer quadrant) were benign and concordant  (d) PET scan 11/21/2019 shows a 1.2 cm nodule in the left breast with an SUV max of 2.7, aortic calcification, but no lung liver or bone lesions, no evidence of metastases   (1) neoadjuvant chemotherapy consisting of cyclophosphamide and doxorubicin in dose dense fashion x4 started 07/30/2019, completed 09/10/2019,  followed by weekly carboplatin and paclitaxel x12 started 09/24/2019  (a) cycle 6 carboplatin/paclitaxel delayed 2 weeks because of neutropenia: granix added  (b) cycle 12 of carboplatin/paclitaxel omitted secondary to persistent low counts  (2) left lumpectomy with targeted axillary lymph node dissection 01/16/2020 showed a ypTIc ypN1 invasive ductal carcinoma, grade 3  (a) a single axillary lymph node was removed  (b) repeat prognostic panel on the surgical specimen showed the tumor to be estrogen and progesterone receptor negative, MIB-110%, and HER-2 positive with a signals ratio of 2.97 and a copy number per cell 4.90.  (3) adjuvant radiation completed 04/01/2020.  (4) genetics testing 07/23/2019 through the STAT Breast cancer panel offered by [facility] found no deleterious mutations in ATM, BRCA1, BRCA2, CDH1, CHEK2, PALB2, PTEN, STK11 and TP53.  Additional testing through the Common Hereditary Cancers Panel offered by [facility] also found no deleterious mutations in IPC, ATM, AXIN2, BARD1, BMPR1A, BRCA1, BRCA2, BRIP1, CDH1, CDK4, CDKN2A (p14ARF), CDKN2A (p16INK4a), CHEK2, CTNNA1, DICER1, EPCAM (Deletion/duplication testing only), GREM1 (promoter region deletion/duplication testing only), KIT, MEN1, MLH1, MSH2, MSH3, MSH6, MUTYH, NBN, NF1, NHTL1, PALB2, PDGFRA, PMS2, POLD1, POLE, PTEN, RAD50, RAD51C, RAD51D, RNF43, SDHB, SDHC, SDHD, SMAD4, SMARCA4. STK11, TP53, TSC1, TSC2, and VHL.  The following genes were evaluated for sequence changes only: SDHA and HOXB13 c.251G>A variant only.   (a) 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   (5) adjuvant T-DM1/ Kadcyla started 02/18/2020, repeated Q3W x 14, completed 11/17/2020  (a) echo 02/12/2020 shows an ejection fraction in the 60-65% range  (b) echo 05/14/2020 shows an ejection fraction in the 55-60% range  (c) echo 08/10/2018 shows an ejection fraction in the 60 to 65% range  (d) switched to trastuzumab for last  3 doses secondary to side effects  (e) echo 11/11/2020 showed an ejection fraction in the 60-65% range   PLAN: Yessica is just about a year out from definitive surgery for her breast cancer with no evidence of disease recurrence.  This is very favorable.  She understands she has been in remission since the time of that surgery.  This is what people mean when they say they are "cancer free" so she should feel comfortable using that phrase with her family if she wishes.  Am concerned that she has osteopenia.  Today we discussed denosumab/Prolia.  We have data that this also reduces the risk of breast cancer recurrence.  She has a good understanding of the possible toxicities side effects and complications including concerns with hypocalcemia, bony aches and pains, and the rare cases of osteonecrosis of the jaw.  I have written the orders for her to start mid July.  This will be repeated every 6 months  I have alerted her surgeon that her port may be removed at any time at his unhurt discretion  She will see me again in 3 months.  We will do lab work and a physical exam at that time.  She knows to call for any other issue that may develop  before then.  Total encounter time 25 minutes.Sarajane Jews C. Kadence Mikkelson, MD 12/28/20 12:53 PM Medical Oncology and Hematology [facility] [place], [place] [address] Tel. [phone]    Fax. [phone]   I, Wilburn Mylar, am acting as scribe for Dr. Virgie Dad. Whitnie Deleon.  I, Lurline Del MD, have reviewed the above documentation for accuracy and completeness, and I agree with the above.   *Total Encounter Time as defined by the Centers for Medicare and Medicaid Services includes, in addition to the face-to-face time of a patient visit (documented in the note above) non-face-to-face time: obtaining and reviewing outside history, ordering and reviewing medications, tests or procedures, care coordination (communications with other  health care professionals or caregivers) and documentation in the medical record.

## 2020-12-28 ENCOUNTER — Ambulatory Visit: Payer: Self-pay | Admitting: Surgery

## 2020-12-28 ENCOUNTER — Other Ambulatory Visit: Payer: Self-pay

## 2020-12-28 ENCOUNTER — Telehealth: Payer: Self-pay | Admitting: Oncology

## 2020-12-28 ENCOUNTER — Inpatient Hospital Stay: Payer: Medicare Other | Attending: Oncology | Admitting: Oncology

## 2020-12-28 ENCOUNTER — Inpatient Hospital Stay: Payer: Medicare Other

## 2020-12-28 VITALS — BP 141/82 | HR 58 | Temp 97.7°F | Resp 18 | Ht 60.0 in | Wt 181.3 lb

## 2020-12-28 DIAGNOSIS — T451X5A Adverse effect of antineoplastic and immunosuppressive drugs, initial encounter: Secondary | ICD-10-CM

## 2020-12-28 DIAGNOSIS — E039 Hypothyroidism, unspecified: Secondary | ICD-10-CM | POA: Insufficient documentation

## 2020-12-28 DIAGNOSIS — Z171 Estrogen receptor negative status [ER-]: Secondary | ICD-10-CM | POA: Diagnosis not present

## 2020-12-28 DIAGNOSIS — Z79899 Other long term (current) drug therapy: Secondary | ICD-10-CM | POA: Diagnosis not present

## 2020-12-28 DIAGNOSIS — E785 Hyperlipidemia, unspecified: Secondary | ICD-10-CM | POA: Insufficient documentation

## 2020-12-28 DIAGNOSIS — C773 Secondary and unspecified malignant neoplasm of axilla and upper limb lymph nodes: Secondary | ICD-10-CM | POA: Insufficient documentation

## 2020-12-28 DIAGNOSIS — Z7982 Long term (current) use of aspirin: Secondary | ICD-10-CM | POA: Insufficient documentation

## 2020-12-28 DIAGNOSIS — Z923 Personal history of irradiation: Secondary | ICD-10-CM | POA: Insufficient documentation

## 2020-12-28 DIAGNOSIS — J45909 Unspecified asthma, uncomplicated: Secondary | ICD-10-CM | POA: Insufficient documentation

## 2020-12-28 DIAGNOSIS — C50512 Malignant neoplasm of lower-outer quadrant of left female breast: Secondary | ICD-10-CM

## 2020-12-28 DIAGNOSIS — M8580 Other specified disorders of bone density and structure, unspecified site: Secondary | ICD-10-CM | POA: Diagnosis not present

## 2020-12-28 DIAGNOSIS — G620 Drug-induced polyneuropathy: Secondary | ICD-10-CM

## 2020-12-28 DIAGNOSIS — Z9221 Personal history of antineoplastic chemotherapy: Secondary | ICD-10-CM | POA: Diagnosis not present

## 2020-12-28 DIAGNOSIS — Z95828 Presence of other vascular implants and grafts: Secondary | ICD-10-CM

## 2020-12-28 DIAGNOSIS — E109 Type 1 diabetes mellitus without complications: Secondary | ICD-10-CM | POA: Diagnosis not present

## 2020-12-28 LAB — COMPREHENSIVE METABOLIC PANEL
ALT: 23 U/L (ref 0–44)
AST: 31 U/L (ref 15–41)
Albumin: 3.2 g/dL — ABNORMAL LOW (ref 3.5–5.0)
Alkaline Phosphatase: 108 U/L (ref 38–126)
Anion gap: 8 (ref 5–15)
BUN: 14 mg/dL (ref 8–23)
CO2: 25 mmol/L (ref 22–32)
Calcium: 8.8 mg/dL — ABNORMAL LOW (ref 8.9–10.3)
Chloride: 109 mmol/L (ref 98–111)
Creatinine, Ser: 0.78 mg/dL (ref 0.44–1.00)
GFR, Estimated: >60 mL/min (ref >60)
Glucose, Bld: 78 mg/dL (ref 70–99)
Potassium: 4.4 mmol/L (ref 3.5–5.1)
Sodium: 142 mmol/L (ref 135–145)
Total Bilirubin: 0.5 mg/dL (ref 0.3–1.2)
Total Protein: 6.3 g/dL — ABNORMAL LOW (ref 6.5–8.1)

## 2020-12-28 LAB — CBC WITH DIFFERENTIAL/PLATELET
Abs Immature Granulocytes: 0.01 10*3/uL (ref 0.00–0.07)
Basophils Absolute: 0 10*3/uL (ref 0.0–0.1)
Basophils Relative: 0 %
Eosinophils Absolute: 0.1 10*3/uL (ref 0.0–0.5)
Eosinophils Relative: 3 %
HCT: 35.8 % — ABNORMAL LOW (ref 36.0–46.0)
Hemoglobin: 11.8 g/dL — ABNORMAL LOW (ref 12.0–15.0)
Immature Granulocytes: 0 %
Lymphocytes Relative: 22 %
Lymphs Abs: 1 10*3/uL (ref 0.7–4.0)
MCH: 32.1 pg (ref 26.0–34.0)
MCHC: 33 g/dL (ref 30.0–36.0)
MCV: 97.3 fL (ref 80.0–100.0)
Monocytes Absolute: 0.4 10*3/uL (ref 0.1–1.0)
Monocytes Relative: 8 %
Neutro Abs: 3.1 10*3/uL (ref 1.7–7.7)
Neutrophils Relative %: 67 %
Platelets: 73 10*3/uL — ABNORMAL LOW (ref 150–400)
RBC: 3.68 MIL/uL — ABNORMAL LOW (ref 3.87–5.11)
RDW: 15.9 % — ABNORMAL HIGH (ref 11.5–15.5)
WBC: 4.6 10*3/uL (ref 4.0–10.5)
nRBC: 0 % (ref 0.0–0.2)

## 2020-12-28 MED ORDER — FILGRASTIM-SNDZ 480 MCG/0.8ML IJ SOSY: 480.0000 ug | PREFILLED_SYRINGE | Freq: Once | INTRAMUSCULAR | Status: DC

## 2020-12-28 MED ORDER — HEPARIN SOD (PORK) LOCK FLUSH 100 UNIT/ML IV SOLN
500.0000 [IU] | Freq: Once | INTRAVENOUS | Status: AC
  Administered 2020-12-28: 500 [IU]
  Filled 2020-12-28: qty 5

## 2020-12-28 MED ORDER — SODIUM CHLORIDE 0.9% FLUSH
10.0000 mL | Freq: Once | INTRAVENOUS | Status: AC
Start: 2020-12-28 — End: 2020-12-28
  Administered 2020-12-28: 10 mL
  Filled 2020-12-28: qty 10

## 2020-12-28 NOTE — Telephone Encounter (Signed)
Scheduled appointment per 06/27 los. Patient is aware.

## 2020-12-29 IMAGING — NM NM BONE WHOLE BODY
2 series · 2 of 2 positions shown · non-contrast
Comparison: None

Radiographic correlation: CT chest 07/31/2019

CLINICAL DATA: LEFT breast cancer, staging

EXAM:
NUCLEAR MEDICINE WHOLE BODY BONE SCAN
TECHNIQUE: Whole body anterior and posterior images were obtained approximately
3 hours after intravenous injection of radiopharmaceutical.
RADIOPHARMACEUTICALS:  19.8 mCi Bechnetium-KKm MDP IV

[Series 1: whole body · 2.66mm/px · 1 of 1 slices shown (1 of 2)]
[im 1/1]
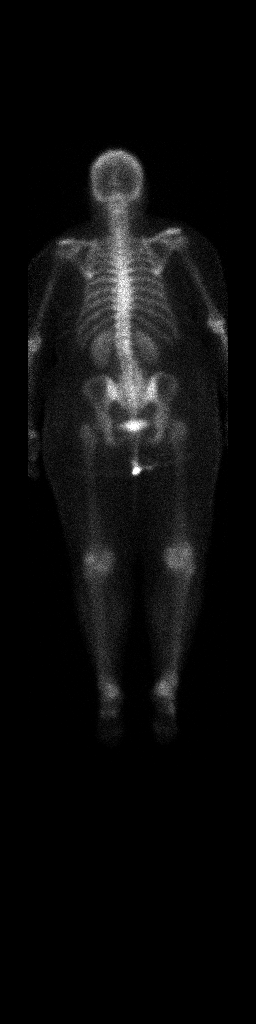

[Series 1: whole body · 2.66mm/px · 1 of 1 slices shown (2 of 2)]
[im 1/1]
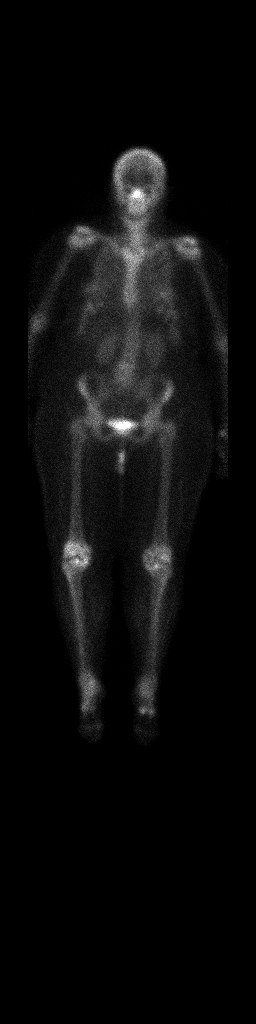

[2 of 2 positions shown; findings below may reference images not displayed]

FINDINGS: Uptake at shoulders, elbows, hips, knees, feet, typically
degenerative.

No worrisome sites of tracer uptake are identified to suggest
osseous metastatic disease.

Expected urinary tract and soft tissue distribution of tracer.
IMPRESSION: No scintigraphic evidence of osseous metastatic disease.

## 2021-01-01 DIAGNOSIS — M1712 Unilateral primary osteoarthritis, left knee: Secondary | ICD-10-CM | POA: Diagnosis not present

## 2021-01-01 DIAGNOSIS — M1711 Unilateral primary osteoarthritis, right knee: Secondary | ICD-10-CM | POA: Diagnosis not present

## 2021-01-08 DIAGNOSIS — M1712 Unilateral primary osteoarthritis, left knee: Secondary | ICD-10-CM | POA: Diagnosis not present

## 2021-01-08 DIAGNOSIS — M1711 Unilateral primary osteoarthritis, right knee: Secondary | ICD-10-CM | POA: Diagnosis not present

## 2021-01-12 ENCOUNTER — Inpatient Hospital Stay: Payer: Medicare Other | Attending: Oncology

## 2021-01-12 ENCOUNTER — Other Ambulatory Visit: Payer: Self-pay

## 2021-01-12 VITALS — BP 141/73 | HR 85 | Temp 98.4°F | Resp 18

## 2021-01-12 DIAGNOSIS — Z171 Estrogen receptor negative status [ER-]: Secondary | ICD-10-CM | POA: Insufficient documentation

## 2021-01-12 DIAGNOSIS — C773 Secondary and unspecified malignant neoplasm of axilla and upper limb lymph nodes: Secondary | ICD-10-CM | POA: Diagnosis not present

## 2021-01-12 DIAGNOSIS — M8580 Other specified disorders of bone density and structure, unspecified site: Secondary | ICD-10-CM | POA: Insufficient documentation

## 2021-01-12 DIAGNOSIS — Z95828 Presence of other vascular implants and grafts: Secondary | ICD-10-CM

## 2021-01-12 DIAGNOSIS — Z923 Personal history of irradiation: Secondary | ICD-10-CM | POA: Diagnosis not present

## 2021-01-12 DIAGNOSIS — Z9221 Personal history of antineoplastic chemotherapy: Secondary | ICD-10-CM | POA: Diagnosis not present

## 2021-01-12 DIAGNOSIS — C50512 Malignant neoplasm of lower-outer quadrant of left female breast: Secondary | ICD-10-CM | POA: Diagnosis not present

## 2021-01-12 MED ORDER — DENOSUMAB 60 MG/ML ~~LOC~~ SOSY
60.0000 mg | PREFILLED_SYRINGE | Freq: Once | SUBCUTANEOUS | Status: AC
  Administered 2021-01-12: 60 mg via SUBCUTANEOUS

## 2021-01-12 MED ORDER — DENOSUMAB 60 MG/ML ~~LOC~~ SOSY
PREFILLED_SYRINGE | SUBCUTANEOUS | Status: AC
  Filled 2021-01-12: qty 1

## 2021-01-12 NOTE — Patient Instructions (Signed)
Denosumab injection What is this medication? DENOSUMAB (den oh sue mab) slows bone breakdown. Prolia is used to treat osteoporosis in women after menopause and in men, and in people who are taking corticosteroids for 6 months or more. Delton See is used to treat a high calcium level due to cancer and to prevent bone fractures and other bone problems caused by multiple myeloma or cancer bone metastases. Delton See is also used totreat giant cell tumor of the bone. This medicine may be used for other purposes; ask your health care provider orpharmacist if you have questions. COMMON BRAND NAME(S): Prolia, XGEVA What should I tell my care team before I take this medication? They need to know if you have any of these conditions: dental disease having surgery or tooth extraction infection kidney disease low levels of calcium or Vitamin D in the blood malnutrition on hemodialysis skin conditions or sensitivity thyroid or parathyroid disease an unusual reaction to denosumab, other medicines, foods, dyes, or preservatives pregnant or trying to get pregnant breast-feeding How should I use this medication? This medicine is for injection under the skin. It is given by a health careprofessional in a hospital or clinic setting. A special MedGuide will be given to you before each treatment. Be sure to readthis information carefully each time. For Prolia, talk to your pediatrician regarding the use of this medicine in children. Special care may be needed. For Delton See, talk to your pediatrician regarding the use of this medicine in children. While this drug may be prescribed for children as young as 13 years for selected conditions,precautions do apply. Overdosage: If you think you have taken too much of this medicine contact apoison control center or emergency room at once. NOTE: This medicine is only for you. Do not share this medicine with others. What if I miss a dose? It is important not to miss your dose. Call  your doctor or health careprofessional if you are unable to keep an appointment. What may interact with this medication? Do not take this medicine with any of the following medications: other medicines containing denosumab This medicine may also interact with the following medications: medicines that lower your chance of fighting infection steroid medicines like prednisone or cortisone This list may not describe all possible interactions. Give your health care provider a list of all the medicines, herbs, non-prescription drugs, or dietary supplements you use. Also tell them if you smoke, drink alcohol, or use illegaldrugs. Some items may interact with your medicine. What should I watch for while using this medication? Visit your doctor or health care professional for regular checks on your progress. Your doctor or health care professional may order blood tests andother tests to see how you are doing. Call your doctor or health care professional for advice if you get a fever, chills or sore throat, or other symptoms of a cold or flu. Do not treat yourself. This drug may decrease your body's ability to fight infection. Try toavoid being around people who are sick. You should make sure you get enough calcium and vitamin D while you are taking this medicine, unless your doctor tells you not to. Discuss the foods you eatand the vitamins you take with your health care professional. See your dentist regularly. Brush and floss your teeth as directed. Before youhave any dental work done, tell your dentist you are receiving this medicine. Do not become pregnant while taking this medicine or for 5 months after stopping it. Talk with your doctor or health care professional about your  birth control options while taking this medicine. Women should inform their doctor if they wish to become pregnant or think they might be pregnant. There is a potential for serious side effects to an unborn child. Talk to your health  careprofessional or pharmacist for more information. What side effects may I notice from receiving this medication? Side effects that you should report to your doctor or health care professionalas soon as possible: allergic reactions like skin rash, itching or hives, swelling of the face, lips, or tongue bone pain breathing problems dizziness jaw pain, especially after dental work redness, blistering, peeling of the skin signs and symptoms of infection like fever or chills; cough; sore throat; pain or trouble passing urine signs of low calcium like fast heartbeat, muscle cramps or muscle pain; pain, tingling, numbness in the hands or feet; seizures unusual bleeding or bruising unusually weak or tired Side effects that usually do not require medical attention (report to yourdoctor or health care professional if they continue or are bothersome): constipation diarrhea headache joint pain loss of appetite muscle pain runny nose tiredness upset stomach This list may not describe all possible side effects. Call your doctor for medical advice about side effects. You may report side effects to FDA at1-800-FDA-1088. Where should I keep my medication? This medicine is only given in a clinic, doctor's office, or other health caresetting and will not be stored at home. NOTE: This sheet is a summary. It may not cover all possible information. If you have questions about this medicine, talk to your doctor, pharmacist, orhealth care provider.  2022 Elsevier/Gold Standard (2017-10-27 16:10:44)

## 2021-01-13 ENCOUNTER — Encounter (HOSPITAL_COMMUNITY): Payer: Self-pay

## 2021-01-15 DIAGNOSIS — M1712 Unilateral primary osteoarthritis, left knee: Secondary | ICD-10-CM | POA: Diagnosis not present

## 2021-01-15 DIAGNOSIS — M1711 Unilateral primary osteoarthritis, right knee: Secondary | ICD-10-CM | POA: Diagnosis not present

## 2021-01-26 DIAGNOSIS — Z853 Personal history of malignant neoplasm of breast: Secondary | ICD-10-CM | POA: Diagnosis not present

## 2021-01-26 DIAGNOSIS — Z452 Encounter for adjustment and management of vascular access device: Secondary | ICD-10-CM | POA: Diagnosis not present

## 2021-01-31 DIAGNOSIS — G43009 Migraine without aura, not intractable, without status migrainosus: Secondary | ICD-10-CM | POA: Diagnosis not present

## 2021-01-31 DIAGNOSIS — I10 Essential (primary) hypertension: Secondary | ICD-10-CM | POA: Diagnosis not present

## 2021-01-31 DIAGNOSIS — E039 Hypothyroidism, unspecified: Secondary | ICD-10-CM | POA: Diagnosis not present

## 2021-01-31 DIAGNOSIS — E7800 Pure hypercholesterolemia, unspecified: Secondary | ICD-10-CM | POA: Diagnosis not present

## 2021-02-23 DIAGNOSIS — M1711 Unilateral primary osteoarthritis, right knee: Secondary | ICD-10-CM | POA: Diagnosis not present

## 2021-02-23 DIAGNOSIS — M1712 Unilateral primary osteoarthritis, left knee: Secondary | ICD-10-CM | POA: Diagnosis not present

## 2021-03-03 DIAGNOSIS — M1712 Unilateral primary osteoarthritis, left knee: Secondary | ICD-10-CM | POA: Diagnosis not present

## 2021-03-03 DIAGNOSIS — M1711 Unilateral primary osteoarthritis, right knee: Secondary | ICD-10-CM | POA: Diagnosis not present

## 2021-03-09 DIAGNOSIS — M1712 Unilateral primary osteoarthritis, left knee: Secondary | ICD-10-CM | POA: Diagnosis not present

## 2021-03-09 DIAGNOSIS — M1711 Unilateral primary osteoarthritis, right knee: Secondary | ICD-10-CM | POA: Diagnosis not present

## 2021-03-11 DIAGNOSIS — M1711 Unilateral primary osteoarthritis, right knee: Secondary | ICD-10-CM | POA: Diagnosis not present

## 2021-03-11 DIAGNOSIS — M1712 Unilateral primary osteoarthritis, left knee: Secondary | ICD-10-CM | POA: Diagnosis not present

## 2021-03-16 DIAGNOSIS — M1711 Unilateral primary osteoarthritis, right knee: Secondary | ICD-10-CM | POA: Diagnosis not present

## 2021-03-16 DIAGNOSIS — M1712 Unilateral primary osteoarthritis, left knee: Secondary | ICD-10-CM | POA: Diagnosis not present

## 2021-03-18 DIAGNOSIS — M1711 Unilateral primary osteoarthritis, right knee: Secondary | ICD-10-CM | POA: Diagnosis not present

## 2021-03-18 DIAGNOSIS — M1712 Unilateral primary osteoarthritis, left knee: Secondary | ICD-10-CM | POA: Diagnosis not present

## 2021-03-30 DIAGNOSIS — M1712 Unilateral primary osteoarthritis, left knee: Secondary | ICD-10-CM | POA: Diagnosis not present

## 2021-03-30 DIAGNOSIS — M1711 Unilateral primary osteoarthritis, right knee: Secondary | ICD-10-CM | POA: Diagnosis not present

## 2021-03-31 DIAGNOSIS — M1711 Unilateral primary osteoarthritis, right knee: Secondary | ICD-10-CM | POA: Diagnosis not present

## 2021-03-31 DIAGNOSIS — M170 Bilateral primary osteoarthritis of knee: Secondary | ICD-10-CM | POA: Diagnosis not present

## 2021-04-01 DIAGNOSIS — M1711 Unilateral primary osteoarthritis, right knee: Secondary | ICD-10-CM | POA: Diagnosis not present

## 2021-04-01 DIAGNOSIS — M1712 Unilateral primary osteoarthritis, left knee: Secondary | ICD-10-CM | POA: Diagnosis not present

## 2021-04-06 DIAGNOSIS — M1711 Unilateral primary osteoarthritis, right knee: Secondary | ICD-10-CM | POA: Diagnosis not present

## 2021-04-06 DIAGNOSIS — M1712 Unilateral primary osteoarthritis, left knee: Secondary | ICD-10-CM | POA: Diagnosis not present

## 2021-04-08 DIAGNOSIS — M1711 Unilateral primary osteoarthritis, right knee: Secondary | ICD-10-CM | POA: Diagnosis not present

## 2021-04-08 DIAGNOSIS — M1712 Unilateral primary osteoarthritis, left knee: Secondary | ICD-10-CM | POA: Diagnosis not present

## 2021-04-13 ENCOUNTER — Other Ambulatory Visit: Payer: Self-pay | Admitting: Oncology

## 2021-04-13 DIAGNOSIS — M1712 Unilateral primary osteoarthritis, left knee: Secondary | ICD-10-CM | POA: Diagnosis not present

## 2021-04-13 DIAGNOSIS — M1711 Unilateral primary osteoarthritis, right knee: Secondary | ICD-10-CM | POA: Diagnosis not present

## 2021-04-15 DIAGNOSIS — M1712 Unilateral primary osteoarthritis, left knee: Secondary | ICD-10-CM | POA: Diagnosis not present

## 2021-04-15 DIAGNOSIS — M1711 Unilateral primary osteoarthritis, right knee: Secondary | ICD-10-CM | POA: Diagnosis not present

## 2021-04-20 DIAGNOSIS — M1711 Unilateral primary osteoarthritis, right knee: Secondary | ICD-10-CM | POA: Diagnosis not present

## 2021-04-20 DIAGNOSIS — M1712 Unilateral primary osteoarthritis, left knee: Secondary | ICD-10-CM | POA: Diagnosis not present

## 2021-04-22 DIAGNOSIS — M1711 Unilateral primary osteoarthritis, right knee: Secondary | ICD-10-CM | POA: Diagnosis not present

## 2021-04-22 DIAGNOSIS — M1712 Unilateral primary osteoarthritis, left knee: Secondary | ICD-10-CM | POA: Diagnosis not present

## 2021-04-27 DIAGNOSIS — M1711 Unilateral primary osteoarthritis, right knee: Secondary | ICD-10-CM | POA: Diagnosis not present

## 2021-04-27 DIAGNOSIS — M1712 Unilateral primary osteoarthritis, left knee: Secondary | ICD-10-CM | POA: Diagnosis not present

## 2021-05-04 DIAGNOSIS — M1712 Unilateral primary osteoarthritis, left knee: Secondary | ICD-10-CM | POA: Diagnosis not present

## 2021-05-04 DIAGNOSIS — M1711 Unilateral primary osteoarthritis, right knee: Secondary | ICD-10-CM | POA: Diagnosis not present

## 2021-05-10 DIAGNOSIS — E039 Hypothyroidism, unspecified: Secondary | ICD-10-CM | POA: Diagnosis not present

## 2021-05-10 DIAGNOSIS — Z1389 Encounter for screening for other disorder: Secondary | ICD-10-CM | POA: Diagnosis not present

## 2021-05-10 DIAGNOSIS — Z23 Encounter for immunization: Secondary | ICD-10-CM | POA: Diagnosis not present

## 2021-05-10 DIAGNOSIS — E7800 Pure hypercholesterolemia, unspecified: Secondary | ICD-10-CM | POA: Diagnosis not present

## 2021-05-10 DIAGNOSIS — Z0000 Encounter for general adult medical examination without abnormal findings: Secondary | ICD-10-CM | POA: Diagnosis not present

## 2021-05-10 DIAGNOSIS — G43009 Migraine without aura, not intractable, without status migrainosus: Secondary | ICD-10-CM | POA: Diagnosis not present

## 2021-05-12 DIAGNOSIS — M1712 Unilateral primary osteoarthritis, left knee: Secondary | ICD-10-CM | POA: Diagnosis not present

## 2021-05-12 DIAGNOSIS — M1711 Unilateral primary osteoarthritis, right knee: Secondary | ICD-10-CM | POA: Diagnosis not present

## 2021-05-19 DIAGNOSIS — M1712 Unilateral primary osteoarthritis, left knee: Secondary | ICD-10-CM | POA: Diagnosis not present

## 2021-05-19 DIAGNOSIS — M1711 Unilateral primary osteoarthritis, right knee: Secondary | ICD-10-CM | POA: Diagnosis not present

## 2021-06-04 DIAGNOSIS — H52223 Regular astigmatism, bilateral: Secondary | ICD-10-CM | POA: Diagnosis not present

## 2021-06-04 DIAGNOSIS — H524 Presbyopia: Secondary | ICD-10-CM | POA: Diagnosis not present

## 2021-06-04 DIAGNOSIS — Z961 Presence of intraocular lens: Secondary | ICD-10-CM | POA: Diagnosis not present

## 2021-06-13 NOTE — Progress Notes (Signed)
[facility]  [phone] [phone]     ID: Julie [place] DOB: 07/23/1945  MR#: [id number]  DGU#:[id number]  Patient Care Team: Julie Cha, MD as PCP - General (Internal Medicine) Julie Germany, RN as Oncology Nurse Navigator Julie Kaufmann, RN as Oncology Nurse Navigator Julie Gibson, MD as Attending Physician (Radiation Oncology) Julie Mesa, MD as Consulting Physician (General Surgery) Julie [place], Julie Dad, MD as Consulting Physician (Oncology) Julie Nakayama, MD as Consulting Physician (Orthopedic Surgery) [place], Julie Pascal, MD as Consulting Physician (Cardiology) Julie [place] OTHER MD:  CHIEF COMPLAINT: 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 She continues under observation.  Most recent mammogram 12/21/2020 showed breast density category B.  There was no evidence of malignancy.  Bone density the same day showed a T score of -1.6.  Since her last visit, she started Prolia on 01/12/2021.   REVIEW OF SYSTEMS: Julie [place]    COVID 36 VACCINATION STATUS: Status post [place] x1, July 2021, no booster as of 08/04/2020    HISTORY OF CURRENT ILLNESS: From the original intake note:  Julie [place] had routine screening mammography on 06/21/2019 showing a possible abnormality in the left breast. She underwent left diagnostic mammography with tomography and left breast ultrasonography at [facility] on 07/01/2019 showing: breast density category C; either 2 adjacent masses or 1 complex dumbbell-shaped mass containing calcifications in the left breast at 6 o'clock, the largest of which measures 2.1 cm; chest wall invasion not excluded; 1-2 mm group of calcifications just anterior to the mass(es); single abnormal lymph  node in the left axilla; vascular calcifications in anterior left breast.  Accordingly on 07/09/2019 she proceeded to biopsy of the left breast areas in question. The pathology from this procedure ([id number]) showed:  1. Left Breast, 5:30, posterior  - invasive ductal carcinoma with necrosis, grade 3  - Prognostic indicators significant for: estrogen receptor, 0% negative and progesterone receptor, 0% negative. Proliferation marker Ki67 at 60%. HER2 negative by immunohistochemistry (0). 2. Left Breast, 5:30, posterior  - invasive ductal carcinoma with necrosis, grade 3  - ductal carcinoma in situ, intermediate grade 3. Left Axilla, lymph node (1)  - metastatic carcinoma involving a lymph node  And additional biopsy of the anterior vascular calcifications in the left breast was performed on 07/10/2019. Pathology [id number]) showed: fibrocystic changes with calcifications; no malignancy identified.  This was felt to be concordant.  The patient's subsequent history is as detailed below.   PAST MEDICAL HISTORY: Past Medical History:  Diagnosis Date   Arthritis 2012   Asthma    no problems now   CHF (congestive heart failure) (HCC)    Family history of basal cell carcinoma    Family history of breast cancer    Family history of colon cancer    Family history of kidney cancer    Family history of leukemia    Family history of peritoneal cancer    GERD (gastroesophageal reflux disease)    H/O hiatal hernia    Hyperlipidemia    Hypothyroidism    IBS (irritable bowel syndrome)    lt breast ca dx'd 07/10/19   Personal history of chemotherapy    Personal history of radiation therapy     PAST SURGICAL HISTORY: Past Surgical History:  Procedure Laterality Date   ABDOMINAL HYSTERECTOMY  age 81   BREAST BIOPSY Left 07/09/2019   x3   BREAST BIOPSY Left 07/10/2019   BREAST BIOPSY Bilateral 07/26/2019   x2 on rt, 1 left   BREAST LUMPECTOMY Left 01/16/2020   BREAST LUMPECTOMY WITH  RADIOACTIVE SEED AND AXILLARY LYMPH NODE DISSECTION Left 01/16/2020   Procedure: LEFT BREAST LUMPECTOMY WITH RADIOACTIVE SEED AND TARGETED AXILLARY LYMPH NODE DISSECTION;  Surgeon: Julie Mesa, MD;  Location: [facility];  Service: General;  Laterality: Left;  LMA   BREAST SURGERY     DILATION AND CURETTAGE OF UTERUS     2 miscarriages age 42 & 39   [place] N/A 10/22/2013   Procedure: FLEXIBLE SIGMOIDOSCOPY;  Surgeon: Julie Fair, MD;  Location: [facility];  Service: Endoscopy;  Laterality: N/A;   KNEE ARTHROSCOPY Left    PORT-A-CATH REMOVAL N/A 01/16/2020   Procedure: REMOVAL PORT-A-CATH;  Surgeon: Julie Mesa, MD;  Location: [facility];  Service: General;  Laterality: N/A;   PORTACATH PLACEMENT Right 07/29/2019   Procedure: INSERTION PORT-A-CATH WITH ULTRASOUND;  Surgeon: Julie Mesa, MD;  Location: [facility];  Service: General;  Laterality: Right;   PORTACATH PLACEMENT N/A 02/13/2020   Procedure: INSERTION PORT-A-CATH WITH ULTRASOUND GUIDANCE;  Surgeon: Julie Mesa, MD;  Location: [facility];  Service: General;  Laterality: N/A;   TONSILLECTOMY     age 5    FAMILY HISTORY: Family History  Problem Relation Age of Onset   Breast cancer Maternal Aunt        dx. in her 51s   Cancer Mother 30       peritoneal cancer, death certificate lists ovarian cancer   Colon cancer Paternal Uncle        dx. late 5s or early 21s   Basal cell carcinoma Sister 49       a second diagnosed in her 57s   Kidney cancer Maternal Grandmother 71   Diabetes Paternal Grandmother    Heart Problems Paternal Grandmother    Leukemia Paternal Uncle        dx. early 1s  Patient's father is currently living at age 22 as of 07/30/19. Patient's mother died from ovarian cancer at age 59. She was diagnosed at age 54. The patient reports breast cancer in a maternal aunt, diagnosed in her 29's. The patient also noted colon cancer in a paternal uncle and kidney cancer  in her maternal grandmother.  She has 1 sister.   GYNECOLOGIC HISTORY:  No LMP recorded. Patient has had a hysterectomy. Menarche: [age] years old Age at first live birth: 75 years old [place] P 2 LMP age 15 (with hysterectomy) Contraceptive: never used HRT used for 10-12 years  Hysterectomy? Yes, age 33 BSO? 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  Husband [name] is a retired Psychologist, prison and probation services. She lives at home with husband Butch. She has two children from a prior marriage. Son [name], age 30, works as a Games developer in [place], [place]. Son [name], age 9, works as a Programme researcher, broadcasting/film/video in [place], [place]. The patient has 4 grandchildren and 4 great-grandchildren. She attends [employer].    ADVANCED DIRECTIVES: In the absence of any documentation to the contrary, the patient's spouse is their HCPOA.    HEALTH MAINTENANCE: Social History   Tobacco Use   Smoking status: Never   Smokeless tobacco: Never  Vaping Use   Vaping Use: Never used  Substance Use Topics  Alcohol use: Never   Drug use: No     Colonoscopy: 09/2013, with CT virtual 11/2013  PAP: none on file, s/p hysterectomy  Bone density: 04/2001, -0.5   Allergies  Allergen Reactions   Penicillins Hives    [place]    Sulfa Antibiotics Rash    Current Outpatient Medications  Medication Sig Dispense Refill   acetaminophen (TYLENOL) 650 MG CR tablet Take 650 mg by mouth every 8 (eight) hours as needed for pain.     aspirin EC 81 MG tablet Take 81 mg by mouth daily.     atorvastatin (LIPITOR) 20 MG tablet Take 20 mg by mouth daily.     calcium citrate-vitamin D (CITRACAL+D) 315-200 MG-UNIT tablet Take 1 tablet by mouth 2 (two) times daily.     Cholecalciferol (VITAMIN D) 2000 UNITS tablet Take 2,000 Units by mouth daily.     co-enzyme Q-10 30 MG capsule Take 30 mg by mouth 3 (three) times daily.      esomeprazole (NEXIUM) 20 MG  capsule Take 20 mg by mouth daily at 12 noon.     famotidine (PEPCID) 20 MG tablet Take 20 mg by mouth daily as needed for heartburn or indigestion.      ferrous sulfate 324 MG TBEC Take 324 mg by mouth daily with breakfast.     FIBER PO Take 1 tablet by mouth daily.     gabapentin (NEURONTIN) 100 MG capsule TAKE 3 CAPSULES(300 MG) BY MOUTH AT BEDTIME 90 capsule 4   levothyroxine (SYNTHROID) 88 MCG tablet Take 88 mcg by mouth daily before breakfast.     lidocaine-prilocaine (EMLA) cream Apply 1 application topically as needed. 30 g 0   loratadine (CLARITIN) 10 MG tablet Take 1 tablet (10 mg total) by mouth daily. 60 tablet 1   Multiple Vitamin (MULTIVITAMIN WITH MINERALS) TABS tablet Take 1 tablet by mouth daily.     nabumetone (RELAFEN) 500 MG tablet Take 500 mg by mouth 2 (two) times daily.     Omega-3 Fatty Acids (FISH OIL) 1200 MG CAPS Take 1,200 mg by mouth daily.     spironolactone (ALDACTONE) 25 MG tablet Take 0.5 tablets (12.5 mg total) by mouth daily. 15 tablet 3   No current facility-administered medications for this visit.    OBJECTIVE: White woman  There were no vitals filed for this visit.    There is no height or weight on file to calculate BMI.   Wt Readings from Last 3 Encounters:  12/28/20 181 lb 4.8 oz (82.2 kg)  11/17/20 179 lb (81.2 kg)  11/11/20 180 lb 12.8 oz (82 kg)  ECOG FS:1 - Symptomatic but completely ambulatory  Sclerae unicteric, EOMs intact Wearing a mask No cervical or supraclavicular adenopathy Lungs no rales or rhonchi Heart regular rate and rhythm Abd soft, nontender, positive bowel sounds MSK no focal spinal tenderness, no upper extremity lymphedema Neuro: nonfocal, well oriented, positive affect Breasts: The right breast is unremarkable.  The left breast is status postlumpectomy and radiation.  There is significant distortion of the breast contour as imaged below but no evidence of local recurrence.  Both axillae are benign.  Inferior view left  breast 06/14/2021   LAB RESULTS:  CMP     Component Value Date/Time   NA 142 12/28/2020 1115   K 4.4 12/28/2020 1115   CL 109 12/28/2020 1115   CO2 25 12/28/2020 1115   GLUCOSE 78 12/28/2020 1115   BUN 14 12/28/2020 1115   CREATININE 0.78 12/28/2020 1115  CREATININE 0.79 09/28/2020 1335   CALCIUM 8.8 (L) 12/28/2020 1115   PROT 6.3 (L) 12/28/2020 1115   ALBUMIN 3.2 (L) 12/28/2020 1115   AST 31 12/28/2020 1115   AST 45 (H) 09/28/2020 1335   ALT 23 12/28/2020 1115   ALT 30 09/28/2020 1335   ALKPHOS 108 12/28/2020 1115   BILITOT 0.5 12/28/2020 1115   BILITOT 0.7 09/28/2020 1335   GFRNONAA >60 12/28/2020 1115   GFRNONAA >60 09/28/2020 1335   GFRAA >60 03/30/2020 0910   GFRAA >60 07/17/2019 0823    No results found for: TOTALPROTELP, ALBUMINELP, A1GS, A2GS, BETS, BETA2SER, GAMS, MSPIKE, SPEI  Lab Results  Component Value Date   WBC 4.6 12/28/2020   NEUTROABS 3.1 12/28/2020   HGB 11.8 (L) 12/28/2020   HCT 35.8 (L) 12/28/2020   MCV 97.3 12/28/2020   PLT 73 (L) 12/28/2020    No results found for: LABCA2  No components found for: TXMIWO032  No results for input(s): INR in the last 168 hours.  No results found for: LABCA2  No results found for: ZYY482  No results found for: NOI370  No results found for: WUG891  No results found for: CA2729  No components found for: HGQUANT  No results found for: CEA1 / No results found for: CEA1   No results found for: AFPTUMOR  No results found for: CHROMOGRNA  No results found for: KPAFRELGTCHN, LAMBDASER, KAPLAMBRATIO (kappa/lambda light chains)  No results found for: HGBA, HGBA2QUANT, HGBFQUANT, HGBSQUAN (Hemoglobinopathy evaluation)   No results found for: LDH  No results found for: IRON, TIBC, IRONPCTSAT (Iron and TIBC)  No results found for: FERRITIN  Urinalysis    Component Value Date/Time   COLORURINE AMBER (A) 09/28/2020 1309   APPEARANCEUR CLEAR 09/28/2020 1309   LABSPEC 1.023 09/28/2020 1309    PHURINE 5.0 09/28/2020 1309   GLUCOSEU NEGATIVE 09/28/2020 1309   HGBUR NEGATIVE 09/28/2020 1309   BILIRUBINUR NEGATIVE 09/28/2020 1309   KETONESUR 5 (A) 09/28/2020 1309   PROTEINUR NEGATIVE 09/28/2020 1309   NITRITE NEGATIVE 09/28/2020 1309   LEUKOCYTESUR LARGE (A) 09/28/2020 1309    STUDIES: No results found.   ELIGIBLE FOR AVAILABLE RESEARCH PROTOCOL: no  ASSESSMENT: 75 y.o. [place] woman status post left breast lower outer quadrant biopsy 07/09/2019 for a clinical T2 N1, stage IIIB invasive ductal carcinoma, grade 3, triple negative, with an MIB-1 of 60%  (a) chest CT scan and bone scan 07/31/2019 showed no evidence of metastatic disease  (b) MRI biopsy of 2 additional areas in the left breast (central, 07/10/2019, and at 8:30 o'clock on 07/26/2019) are both benign and concordant  (c) biopsy of 2 suspicious areas in the right breast on 07/26/2019 (9:00 and lower outer quadrant) were benign and concordant  (d) PET scan 11/21/2019 shows a 1.2 cm nodule in the left breast with an SUV max of 2.7, aortic calcification, but no lung liver or bone lesions, no evidence of metastases   (1) neoadjuvant chemotherapy consisting of cyclophosphamide and doxorubicin in dose dense fashion x4 started 07/30/2019, completed 09/10/2019, followed by weekly carboplatin and paclitaxel x12 started 09/24/2019  (a) cycle 6 carboplatin/paclitaxel delayed 2 weeks because of neutropenia: granix added  (b) cycle 12 of carboplatin/paclitaxel omitted secondary to persistent low counts  (2) left lumpectomy with targeted axillary lymph node dissection 01/16/2020 showed a ypTIc ypN1 invasive ductal carcinoma, grade 3  (a) a single axillary lymph node was removed  (b) repeat prognostic panel on the surgical specimen showed the tumor to be estrogen and  progesterone receptor negative, MIB-110%, and HER-2 positive with a signals ratio of 2.97 and a copy number per cell 4.90.  (3) adjuvant radiation completed  04/01/2020.  (4) genetics testing 07/23/2019 through the STAT Breast cancer panel offered by [facility] found no deleterious mutations in ATM, BRCA1, BRCA2, CDH1, CHEK2, PALB2, PTEN, STK11 and TP53.  Additional testing through the Common Hereditary Cancers Panel offered by [facility] also found no deleterious mutations in IPC, ATM, AXIN2, BARD1, BMPR1A, BRCA1, BRCA2, BRIP1, CDH1, CDK4, CDKN2A (p14ARF), CDKN2A (p16INK4a), CHEK2, CTNNA1, DICER1, EPCAM (Deletion/duplication testing only), GREM1 (promoter region deletion/duplication testing only), KIT, MEN1, MLH1, MSH2, MSH3, MSH6, MUTYH, NBN, NF1, NHTL1, PALB2, PDGFRA, PMS2, POLD1, POLE, PTEN, RAD50, RAD51C, RAD51D, RNF43, SDHB, SDHC, SDHD, SMAD4, SMARCA4. STK11, TP53, TSC1, TSC2, and VHL.  The following genes were evaluated for sequence changes only: SDHA and HOXB13 c.251G>A variant only.   (a) 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   (5) adjuvant T-DM1/ Kadcyla started 02/18/2020, repeated Q3W x 14, completed 11/17/2020  (a) echo 02/12/2020 shows an ejection fraction in the 60-65% range  (b) echo 05/14/2020 shows an ejection fraction in the 55-60% range  (c) echo 08/10/2018 shows an ejection fraction in the 60 to 65% range  (d) switched to trastuzumab for last 3 doses secondary to side effects  (e) echo 11/11/2020 showed an ejection fraction in the 60-65% range  (6) denosumab/Prolia started 01/12/2021  (a) bone density 12/22/2020 showed a T score of -1.6  (7) postmenopausal bleeding: referral placed 06/14/2021   PLAN: Julie Thompson is now about a year and a half out from definitive surgery for her breast cancer with no evidence of disease recurrence.  This is favorable.  Her surgical breast shows significant distortion and some erythema.  I have imaged it above for future reference.  There is however no evidence of local recurrence.  She tells me that she is having some vaginal bleeding.  She had this previously  and was not sure whether it was rectal or vaginal but now it is persistent and vaginal.  I have referred her to our gynecology oncology group since she does not have a gynecologist of her own for further evaluation.  Otherwise she will return for Prolia January and then July and she will see [place] again with the July visit  Total encounter time 25 minutes.Sarajane Jews C. Sharan Mcenaney, MD 06/13/21 7:53 AM Medical Oncology and Hematology [facility] [place], [place] [address] Tel. [phone]    Fax. [phone]   I, Wilburn Mylar, am acting as scribe for Dr. Virgie Thompson. Harout Scheurich.  I, Lurline Del MD, have reviewed the above documentation for accuracy and completeness, and I agree with the above.   *Total Encounter Time as defined by the Centers for Medicare and Medicaid Services includes, in addition to the face-to-face time of a patient visit (documented in the note above) non-face-to-face time: obtaining and reviewing outside history, ordering and reviewing medications, tests or procedures, care coordination (communications with other health care professionals or caregivers) and documentation in the medical record.

## 2021-06-14 ENCOUNTER — Inpatient Hospital Stay: Payer: Medicare Other

## 2021-06-14 ENCOUNTER — Other Ambulatory Visit: Payer: Self-pay

## 2021-06-14 ENCOUNTER — Inpatient Hospital Stay: Payer: Medicare Other | Attending: Oncology | Admitting: Oncology

## 2021-06-14 VITALS — BP 146/55 | HR 68 | Temp 97.7°F | Wt 182.8 lb

## 2021-06-14 DIAGNOSIS — N950 Postmenopausal bleeding: Secondary | ICD-10-CM

## 2021-06-14 DIAGNOSIS — Z923 Personal history of irradiation: Secondary | ICD-10-CM | POA: Insufficient documentation

## 2021-06-14 DIAGNOSIS — N939 Abnormal uterine and vaginal bleeding, unspecified: Secondary | ICD-10-CM | POA: Diagnosis not present

## 2021-06-14 DIAGNOSIS — C50512 Malignant neoplasm of lower-outer quadrant of left female breast: Secondary | ICD-10-CM | POA: Diagnosis not present

## 2021-06-14 DIAGNOSIS — Z9221 Personal history of antineoplastic chemotherapy: Secondary | ICD-10-CM | POA: Diagnosis not present

## 2021-06-14 DIAGNOSIS — Z7982 Long term (current) use of aspirin: Secondary | ICD-10-CM | POA: Diagnosis not present

## 2021-06-14 DIAGNOSIS — Z171 Estrogen receptor negative status [ER-]: Secondary | ICD-10-CM

## 2021-06-14 DIAGNOSIS — Z853 Personal history of malignant neoplasm of breast: Secondary | ICD-10-CM | POA: Insufficient documentation

## 2021-06-14 DIAGNOSIS — E119 Type 2 diabetes mellitus without complications: Secondary | ICD-10-CM | POA: Insufficient documentation

## 2021-06-14 DIAGNOSIS — Z79899 Other long term (current) drug therapy: Secondary | ICD-10-CM | POA: Diagnosis not present

## 2021-06-14 DIAGNOSIS — E109 Type 1 diabetes mellitus without complications: Secondary | ICD-10-CM | POA: Insufficient documentation

## 2021-06-14 LAB — CBC WITH DIFFERENTIAL/PLATELET
Abs Immature Granulocytes: 0.01 10*3/uL (ref 0.00–0.07)
Basophils Absolute: 0 10*3/uL (ref 0.0–0.1)
Basophils Relative: 0 %
Eosinophils Absolute: 0.3 10*3/uL (ref 0.0–0.5)
Eosinophils Relative: 5 %
HCT: 36.3 % (ref 36.0–46.0)
Hemoglobin: 11.8 g/dL — ABNORMAL LOW (ref 12.0–15.0)
Immature Granulocytes: 0 %
Lymphocytes Relative: 21 %
Lymphs Abs: 1 10*3/uL (ref 0.7–4.0)
MCH: 32.3 pg (ref 26.0–34.0)
MCHC: 32.5 g/dL (ref 30.0–36.0)
MCV: 99.5 fL (ref 80.0–100.0)
Monocytes Absolute: 0.3 10*3/uL (ref 0.1–1.0)
Monocytes Relative: 7 %
Neutro Abs: 3 10*3/uL (ref 1.7–7.7)
Neutrophils Relative %: 67 %
Platelets: 117 10*3/uL — ABNORMAL LOW (ref 150–400)
RBC: 3.65 MIL/uL — ABNORMAL LOW (ref 3.87–5.11)
RDW: 14 % (ref 11.5–15.5)
WBC: 4.6 10*3/uL (ref 4.0–10.5)
nRBC: 0 % (ref 0.0–0.2)

## 2021-06-14 LAB — COMPREHENSIVE METABOLIC PANEL
ALT: 29 U/L (ref 0–44)
AST: 28 U/L (ref 15–41)
Albumin: 3.4 g/dL — ABNORMAL LOW (ref 3.5–5.0)
Alkaline Phosphatase: 79 U/L (ref 38–126)
Anion gap: 7 (ref 5–15)
BUN: 16 mg/dL (ref 8–23)
CO2: 24 mmol/L (ref 22–32)
Calcium: 8.4 mg/dL — ABNORMAL LOW (ref 8.9–10.3)
Chloride: 113 mmol/L — ABNORMAL HIGH (ref 98–111)
Creatinine, Ser: 0.8 mg/dL (ref 0.44–1.00)
GFR, Estimated: >60 mL/min (ref >60)
Glucose, Bld: 98 mg/dL (ref 70–99)
Potassium: 3.9 mmol/L (ref 3.5–5.1)
Sodium: 144 mmol/L (ref 135–145)
Total Bilirubin: 0.6 mg/dL (ref 0.3–1.2)
Total Protein: 6.5 g/dL (ref 6.5–8.1)

## 2021-06-18 DIAGNOSIS — J40 Bronchitis, not specified as acute or chronic: Secondary | ICD-10-CM | POA: Diagnosis not present

## 2021-06-18 DIAGNOSIS — J0110 Acute frontal sinusitis, unspecified: Secondary | ICD-10-CM | POA: Diagnosis not present

## 2021-07-02 DIAGNOSIS — E7800 Pure hypercholesterolemia, unspecified: Secondary | ICD-10-CM | POA: Diagnosis not present

## 2021-07-02 DIAGNOSIS — E039 Hypothyroidism, unspecified: Secondary | ICD-10-CM | POA: Diagnosis not present

## 2021-07-02 DIAGNOSIS — M170 Bilateral primary osteoarthritis of knee: Secondary | ICD-10-CM | POA: Diagnosis not present

## 2021-07-02 DIAGNOSIS — I10 Essential (primary) hypertension: Secondary | ICD-10-CM | POA: Diagnosis not present

## 2021-07-14 DIAGNOSIS — M1712 Unilateral primary osteoarthritis, left knee: Secondary | ICD-10-CM | POA: Diagnosis not present

## 2021-07-14 DIAGNOSIS — M1711 Unilateral primary osteoarthritis, right knee: Secondary | ICD-10-CM | POA: Diagnosis not present

## 2021-07-30 ENCOUNTER — Other Ambulatory Visit: Payer: Self-pay

## 2021-07-30 ENCOUNTER — Ambulatory Visit: Payer: Medicare Other | Admitting: Obstetrics & Gynecology

## 2021-07-30 ENCOUNTER — Encounter: Payer: Self-pay | Admitting: Obstetrics & Gynecology

## 2021-07-30 VITALS — BP 114/77 | HR 68 | Wt 182.0 lb

## 2021-07-30 DIAGNOSIS — N763 Subacute and chronic vulvitis: Secondary | ICD-10-CM | POA: Diagnosis not present

## 2021-07-30 MED ORDER — TRIAMCINOLONE ACETONIDE 0.1 % EX CREA: 1.0000 "application " | TOPICAL_CREAM | Freq: Two times a day (BID) | CUTANEOUS | 0 refills | Status: DC

## 2021-07-30 NOTE — Progress Notes (Signed)
Pt states she had some bleeding while on an immunosuppressant.  Pt states she has had no bleeding in 6-8 weeks.

## 2021-07-30 NOTE — Progress Notes (Signed)
Patient ID: [name], female   DOB: May 07, 1946, 76 y.o.   MRN: [id number]  Chief Complaint  Patient presents with   New Patient (Initial Visit)  Vaginal bleeding  HPI Ashia Dehner is a 76 y.o. female.  G2P2, s/p TAH BSO at age 24 for endometriosis. She noted small amount of vaginal bleeding about a year ago when she was on immunosuppressant therapy for breast cancer and the last episode of bleeding was about 8 weeks ago. No vaginal discharge or discomfort. Dr Jana Hakim made referral.  HPI  Past Medical History:  Diagnosis Date   Arthritis 2012   Asthma    no problems now   CHF (congestive heart failure) (HCC)    Family history of basal cell carcinoma    Family history of breast cancer    Family history of colon cancer    Family history of kidney cancer    Family history of leukemia    Family history of peritoneal cancer    GERD (gastroesophageal reflux disease)    H/O hiatal hernia    Hyperlipidemia    Hypothyroidism    IBS (irritable bowel syndrome)    lt breast ca dx'd 07/10/19   Personal history of chemotherapy    Personal history of radiation therapy     Past Surgical History:  Procedure Laterality Date   ABDOMINAL HYSTERECTOMY     age 52   BREAST BIOPSY Left 07/09/2019   x3   BREAST BIOPSY Left 07/10/2019   BREAST BIOPSY Bilateral 07/26/2019   x2 on rt, 1 left   BREAST LUMPECTOMY Left 01/16/2020   BREAST LUMPECTOMY WITH RADIOACTIVE SEED AND AXILLARY LYMPH NODE DISSECTION Left 01/16/2020   Procedure: LEFT BREAST LUMPECTOMY WITH RADIOACTIVE SEED AND TARGETED AXILLARY LYMPH NODE DISSECTION;  Surgeon: Donnie Mesa, MD;  Location: [facility];  Service: General;  Laterality: Left;  LMA   BREAST SURGERY     DILATION AND CURETTAGE OF UTERUS     2 miscarriages age 70 & 36   [place] N/A 10/22/2013   Procedure: FLEXIBLE SIGMOIDOSCOPY;  Surgeon: Garlan Fair, MD;  Location: [facility];  Service: Endoscopy;  Laterality: N/A;   KNEE ARTHROSCOPY  Left    PORT-A-CATH REMOVAL N/A 01/16/2020   Procedure: REMOVAL PORT-A-CATH;  Surgeon: Donnie Mesa, MD;  Location: [place];  Service: General;  Laterality: N/A;   PORTACATH PLACEMENT Right 07/29/2019   Procedure: INSERTION PORT-A-CATH WITH ULTRASOUND;  Surgeon: Donnie Mesa, MD;  Location: [place];  Service: General;  Laterality: Right;   PORTACATH PLACEMENT N/A 02/13/2020   Procedure: INSERTION PORT-A-CATH WITH ULTRASOUND GUIDANCE;  Surgeon: Donnie Mesa, MD;  Location: [facility];  Service: General;  Laterality: N/A;   TONSILLECTOMY     age 13    Family History  Problem Relation Age of Onset   Breast cancer Maternal Aunt        dx. in her 42s   Cancer Mother 68       peritoneal cancer, death certificate lists ovarian cancer   Colon cancer Paternal Uncle        dx. late 72s or early 109s   Basal cell carcinoma Sister 76       a second diagnosed in her 54s   Kidney cancer Maternal Grandmother 71   Diabetes Paternal Grandmother    Heart Problems Paternal Grandmother    Leukemia Paternal Uncle        dx. early 70s    Social History Social History  Tobacco Use   Smoking status: Never   Smokeless tobacco: Never  Vaping Use   Vaping Use: Never used  Substance Use Topics   Alcohol use: Never   Drug use: No    Allergies  Allergen Reactions   Penicillins Hives    Whelps    Sulfa Antibiotics Rash    Current Outpatient Medications  Medication Sig Dispense Refill   triamcinolone cream (KENALOG) 0.1 % Apply 1 application topically 2 (two) times daily. 30 g 0   acetaminophen (TYLENOL) 650 MG CR tablet Take 650 mg by mouth every 8 (eight) hours as needed for pain.     aspirin EC 81 MG tablet Take 81 mg by mouth daily.     atorvastatin (LIPITOR) 20 MG tablet Take 20 mg by mouth daily.     calcium citrate-vitamin D (CITRACAL+D) 315-200 MG-UNIT tablet Take 1 tablet by mouth 2 (two) times daily.     Cholecalciferol (VITAMIN D) 2000 UNITS  tablet Take 2,000 Units by mouth daily.     co-enzyme Q-10 30 MG capsule Take 30 mg by mouth 3 (three) times daily.      esomeprazole (NEXIUM) 20 MG capsule Take 20 mg by mouth daily at 12 noon.     famotidine (PEPCID) 20 MG tablet Take 20 mg by mouth daily as needed for heartburn or indigestion.      ferrous sulfate 324 MG TBEC Take 324 mg by mouth daily with breakfast.     FIBER PO Take 1 tablet by mouth daily.     gabapentin (NEURONTIN) 100 MG capsule TAKE 3 CAPSULES(300 MG) BY MOUTH AT BEDTIME 90 capsule 4   levothyroxine (SYNTHROID) 88 MCG tablet Take 88 mcg by mouth daily before breakfast.     loratadine (CLARITIN) 10 MG tablet Take 1 tablet (10 mg total) by mouth daily. 60 tablet 1   Multiple Vitamin (MULTIVITAMIN WITH MINERALS) TABS tablet Take 1 tablet by mouth daily.     nabumetone (RELAFEN) 750 MG tablet Take 1 tablet (750 mg total) by mouth 2 (two) times daily.     Omega-3 Fatty Acids (FISH OIL) 1200 MG CAPS Take 1,200 mg by mouth daily.     spironolactone (ALDACTONE) 25 MG tablet Take 0.5 tablets (12.5 mg total) by mouth daily. 15 tablet 3   No current facility-administered medications for this visit.    Review of Systems Review of Systems  Constitutional: Negative.   Genitourinary:  Positive for frequency (urge incontinence, wears diaper) and vaginal bleeding (none current). Negative for hematuria, menstrual problem and pelvic pain.   Blood pressure 114/77, pulse 68, weight 182 lb (82.6 kg).  Physical Exam Physical Exam Vitals and nursing note reviewed. Exam conducted with a chaperone present.  Constitutional:      Appearance: Normal appearance.  Cardiovascular:     Rate and Rhythm: Normal rate.  Pulmonary:     Effort: Pulmonary effort is normal.  Genitourinary:      Comments: Vulvar skin pale, some erythema posterior right introitus, vaginal atrophy noted, cuff intact no blood or discharge Skin:    General: Skin is warm and dry.  Neurological:     General: No  focal deficit present.     Mental Status: She is alert.    Data Reviewed Dr. 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   Port-A-Cath in  place 10/15/2019   Aortic calcification ([place]) 08/06/2019   Genetic testing 07/23/2019   Family history of peritoneal cancer    Family history of breast cancer    Family history of kidney cancer    Family history of colon cancer    Family history of leukemia    Family history of basal cell carcinoma    Malignant neoplasm of lower-outer quadrant of left breast of female, estrogen receptor negative ([place]) 07/11/2019   Urinary incontinence  Plan Meds ordered this encounter  Medications   triamcinolone cream (KENALOG) 0.1 %    Sig: Apply 1 application topically 2 (two) times daily.    Dispense:  30 g    Refill:  0  Re-examine in 4 weeks, consider vulvar biopsy. Likely will refer to urogyn for evaluation of incontinence     Emeterio Reeve 07/30/2021, 10:40 AM

## 2021-08-02 ENCOUNTER — Other Ambulatory Visit: Payer: Self-pay

## 2021-08-02 ENCOUNTER — Inpatient Hospital Stay: Payer: Medicare Other

## 2021-08-02 ENCOUNTER — Inpatient Hospital Stay: Payer: Medicare Other | Attending: Oncology

## 2021-08-02 VITALS — BP 130/86 | HR 63 | Temp 98.2°F | Resp 18

## 2021-08-02 DIAGNOSIS — C50512 Malignant neoplasm of lower-outer quadrant of left female breast: Secondary | ICD-10-CM

## 2021-08-02 DIAGNOSIS — Z171 Estrogen receptor negative status [ER-]: Secondary | ICD-10-CM

## 2021-08-02 DIAGNOSIS — Z853 Personal history of malignant neoplasm of breast: Secondary | ICD-10-CM | POA: Diagnosis not present

## 2021-08-02 DIAGNOSIS — M810 Age-related osteoporosis without current pathological fracture: Secondary | ICD-10-CM | POA: Diagnosis not present

## 2021-08-02 LAB — CBC WITH DIFFERENTIAL/PLATELET
Abs Immature Granulocytes: 0.01 10*3/uL (ref 0.00–0.07)
Basophils Absolute: 0 10*3/uL (ref 0.0–0.1)
Basophils Relative: 0 %
Eosinophils Absolute: 0.1 10*3/uL (ref 0.0–0.5)
Eosinophils Relative: 3 %
HCT: 37.5 % (ref 36.0–46.0)
Hemoglobin: 12.1 g/dL (ref 12.0–15.0)
Immature Granulocytes: 0 %
Lymphocytes Relative: 28 %
Lymphs Abs: 1.2 10*3/uL (ref 0.7–4.0)
MCH: 32.6 pg (ref 26.0–34.0)
MCHC: 32.3 g/dL (ref 30.0–36.0)
MCV: 101.1 fL — ABNORMAL HIGH (ref 80.0–100.0)
Monocytes Absolute: 0.4 10*3/uL (ref 0.1–1.0)
Monocytes Relative: 8 %
Neutro Abs: 2.6 10*3/uL (ref 1.7–7.7)
Neutrophils Relative %: 61 %
Platelets: 133 10*3/uL — ABNORMAL LOW (ref 150–400)
RBC: 3.71 MIL/uL — ABNORMAL LOW (ref 3.87–5.11)
RDW: 14.1 % (ref 11.5–15.5)
WBC: 4.4 10*3/uL (ref 4.0–10.5)
nRBC: 0 % (ref 0.0–0.2)

## 2021-08-02 LAB — COMPREHENSIVE METABOLIC PANEL
ALT: 19 U/L (ref 0–44)
AST: 22 U/L (ref 15–41)
Albumin: 3.9 g/dL (ref 3.5–5.0)
Alkaline Phosphatase: 63 U/L (ref 38–126)
Anion gap: 4 — ABNORMAL LOW (ref 5–15)
BUN: 23 mg/dL (ref 8–23)
CO2: 31 mmol/L (ref 22–32)
Calcium: 9.3 mg/dL (ref 8.9–10.3)
Chloride: 106 mmol/L (ref 98–111)
Creatinine, Ser: 0.98 mg/dL (ref 0.44–1.00)
GFR, Estimated: >60 mL/min (ref >60)
Glucose, Bld: 66 mg/dL — ABNORMAL LOW (ref 70–99)
Potassium: 4.5 mmol/L (ref 3.5–5.1)
Sodium: 141 mmol/L (ref 135–145)
Total Bilirubin: 0.5 mg/dL (ref 0.3–1.2)
Total Protein: 6.6 g/dL (ref 6.5–8.1)

## 2021-08-02 MED ORDER — DENOSUMAB 60 MG/ML ~~LOC~~ SOSY
60.0000 mg | PREFILLED_SYRINGE | Freq: Once | SUBCUTANEOUS | Status: AC
  Administered 2021-08-02: 60 mg via SUBCUTANEOUS
  Filled 2021-08-02: qty 1

## 2021-08-02 NOTE — Patient Instructions (Signed)
Denosumab injection What is this medication? DENOSUMAB (den oh sue mab) slows bone breakdown. Prolia is used to treat osteoporosis in women after menopause and in men, and in people who are taking corticosteroids for 6 months or more. Delton See is used to treat a high calcium level due to cancer and to prevent bone fractures and other bone problems caused by multiple myeloma or cancer bone metastases. Delton See is also used to treat giant cell tumor of the bone. This medicine may be used for other purposes; ask your health care provider or pharmacist if you have questions. COMMON BRAND NAME(S): Prolia, XGEVA What should I tell my care team before I take this medication? They need to know if you have any of these conditions: dental disease having surgery or tooth extraction infection kidney disease low levels of calcium or Vitamin D in the blood malnutrition on hemodialysis skin conditions or sensitivity thyroid or parathyroid disease an unusual reaction to denosumab, other medicines, foods, dyes, or preservatives pregnant or trying to get pregnant breast-feeding How should I use this medication? This medicine is for injection under the skin. It is given by a health care professional in a hospital or clinic setting. A special MedGuide will be given to you before each treatment. Be sure to read this information carefully each time. For Prolia, talk to your pediatrician regarding the use of this medicine in children. Special care may be needed. For Delton See, talk to your pediatrician regarding the use of this medicine in children. While this drug may be prescribed for children as young as 13 years for selected conditions, precautions do apply. Overdosage: If you think you have taken too much of this medicine contact a poison control center or emergency room at once. NOTE: This medicine is only for you. Do not share this medicine with others. What if I miss a dose? It is important not to miss your dose.  Call your doctor or health care professional if you are unable to keep an appointment. What may interact with this medication? Do not take this medicine with any of the following medications: other medicines containing denosumab This medicine may also interact with the following medications: medicines that lower your chance of fighting infection steroid medicines like prednisone or cortisone This list may not describe all possible interactions. Give your health care provider a list of all the medicines, herbs, non-prescription drugs, or dietary supplements you use. Also tell them if you smoke, drink alcohol, or use illegal drugs. Some items may interact with your medicine. What should I watch for while using this medication? Visit your doctor or health care professional for regular checks on your progress. Your doctor or health care professional may order blood tests and other tests to see how you are doing. Call your doctor or health care professional for advice if you get a fever, chills or sore throat, or other symptoms of a cold or flu. Do not treat yourself. This drug may decrease your body's ability to fight infection. Try to avoid being around people who are sick. You should make sure you get enough calcium and vitamin D while you are taking this medicine, unless your doctor tells you not to. Discuss the foods you eat and the vitamins you take with your health care professional. See your dentist regularly. Brush and floss your teeth as directed. Before you have any dental work done, tell your dentist you are receiving this medicine. Do not become pregnant while taking this medicine or for 5 months after  stopping it. Talk with your doctor or health care professional about your birth control options while taking this medicine. Women should inform their doctor if they wish to become pregnant or think they might be pregnant. There is a potential for serious side effects to an unborn child. Talk to  your health care professional or pharmacist for more information. What side effects may I notice from receiving this medication? Side effects that you should report to your doctor or health care professional as soon as possible: allergic reactions like skin rash, itching or hives, swelling of the face, lips, or tongue bone pain breathing problems dizziness jaw pain, especially after dental work redness, blistering, peeling of the skin signs and symptoms of infection like fever or chills; cough; sore throat; pain or trouble passing urine signs of low calcium like fast heartbeat, muscle cramps or muscle pain; pain, tingling, numbness in the hands or feet; seizures unusual bleeding or bruising unusually weak or tired Side effects that usually do not require medical attention (report to your doctor or health care professional if they continue or are bothersome): constipation diarrhea headache joint pain loss of appetite muscle pain runny nose tiredness upset stomach This list may not describe all possible side effects. Call your doctor for medical advice about side effects. You may report side effects to FDA at 1-800-FDA-1088. Where should I keep my medication? This medicine is only given in a clinic, doctor's office, or other health care setting and will not be stored at home. NOTE: This sheet is a summary. It may not cover all possible information. If you have questions about this medicine, talk to your doctor, pharmacist, or health care provider.  2022 Elsevier/Gold Standard (2017-10-27 00:00:00)

## 2021-08-06 ENCOUNTER — Other Ambulatory Visit: Payer: Self-pay | Admitting: *Deleted

## 2021-08-06 MED ORDER — GABAPENTIN 100 MG PO CAPS: ORAL_CAPSULE | ORAL | 3 refills | Status: DC

## 2021-08-06 NOTE — Telephone Encounter (Signed)
This RN spoke with pt per her call stating she is noticing improvement in neuropathy in her feet since adding a 100 mg dose in AM of her gabapentin.  She is asking if she can increase to 2 tabs in the morning for possible further benefit.  Per discussion she will increase to 2 tabs in the am and maintain current Qhs dose of 300 mg.  Also review supplement PEA with luteolin which some patients are having benefit using.  Julie Thompson verbalized understanding of plan.  No further questions at this time.

## 2021-08-23 ENCOUNTER — Other Ambulatory Visit: Payer: Self-pay

## 2021-08-23 ENCOUNTER — Ambulatory Visit: Payer: Medicare Other | Admitting: Obstetrics & Gynecology

## 2021-08-23 VITALS — BP 132/84 | HR 68 | Wt 184.0 lb

## 2021-08-23 DIAGNOSIS — N763 Subacute and chronic vulvitis: Secondary | ICD-10-CM | POA: Diagnosis not present

## 2021-08-23 DIAGNOSIS — N3941 Urge incontinence: Secondary | ICD-10-CM

## 2021-08-23 MED ORDER — TRIAMCINOLONE ACETONIDE 0.1 % EX CREA: 1.0000 "application " | TOPICAL_CREAM | Freq: Every day | CUTANEOUS | 1 refills | Status: DC

## 2021-08-23 MED ORDER — MIRABEGRON ER 50 MG PO TB24: 50.0000 mg | ORAL_TABLET | Freq: Every day | ORAL | 2 refills | Status: DC

## 2021-08-23 NOTE — Progress Notes (Signed)
Patient ID: Julie Thompson, female   DOB: [birth year], 76 y.o.   MRN: [id number]  Chief Complaint  Patient presents with   Follow-up    HPI Maris Abascal is a 76 y.o. female.  F/u after 1 month of topical steroids for vulvitis HPI  Past Medical History:  Diagnosis Date   Arthritis 2012   Asthma    no problems now   CHF (congestive heart failure) (HCC)    Family history of basal cell carcinoma    Family history of breast cancer    Family history of colon cancer    Family history of kidney cancer    Family history of leukemia    Family history of peritoneal cancer    GERD (gastroesophageal reflux disease)    H/O hiatal hernia    Hyperlipidemia    Hypothyroidism    IBS (irritable bowel syndrome)    lt breast ca dx'd 07/10/19   Personal history of chemotherapy    Personal history of radiation therapy     Past Surgical History:  Procedure Laterality Date   ABDOMINAL HYSTERECTOMY     age 81   BREAST BIOPSY Left 07/09/2019   x3   BREAST BIOPSY Left 07/10/2019   BREAST BIOPSY Bilateral 07/26/2019   x2 on rt, 1 left   BREAST LUMPECTOMY Left 01/16/2020   BREAST LUMPECTOMY WITH RADIOACTIVE SEED AND AXILLARY LYMPH NODE DISSECTION Left 01/16/2020   Procedure: LEFT BREAST LUMPECTOMY WITH RADIOACTIVE SEED AND TARGETED AXILLARY LYMPH NODE DISSECTION;  Surgeon: Donnie Mesa, MD;  Location: [facility];  Service: General;  Laterality: Left;  LMA   BREAST SURGERY     DILATION AND CURETTAGE OF UTERUS     2 miscarriages age 64 & 63   [place] N/A 10/22/2013   Procedure: FLEXIBLE SIGMOIDOSCOPY;  Surgeon: Garlan Fair, MD;  [facility]: [facility];  Service: Endoscopy;  Laterality: N/A;   KNEE ARTHROSCOPY Left    PORT-A-CATH REMOVAL N/A 01/16/2020   Procedure: REMOVAL PORT-A-CATH;  Surgeon: Donnie Mesa, MD;  Location: [place] [facility];  Service: General;  Laterality: N/A;   PORTACATH PLACEMENT Right 07/29/2019   Procedure: INSERTION PORT-A-CATH WITH ULTRASOUND;  Surgeon:  Donnie Mesa, MD;  Location: [facility];  Service: General;  Laterality: Right;   PORTACATH PLACEMENT N/A 02/13/2020   Procedure: INSERTION PORT-A-CATH WITH ULTRASOUND GUIDANCE;  Surgeon: Donnie Mesa, MD;  Location: [place];  Service: General;  Laterality: N/A;   TONSILLECTOMY     age 66    Family History  Problem Relation Age of Onset   Breast cancer Maternal Aunt        dx. in her 15s   Cancer Mother 93       peritoneal cancer, death certificate lists ovarian cancer   Colon cancer Paternal Uncle        dx. late 52s or early 72s   Basal cell carcinoma Sister 70       a second diagnosed in her 22s   Kidney cancer Maternal Grandmother 71   Diabetes Paternal Grandmother    Heart Problems Paternal Grandmother    Leukemia Paternal Uncle        dx. early 34s    Social History Social History   Tobacco Use   Smoking status: Never   Smokeless tobacco: Never  Vaping Use   Vaping Use: Never used  Substance Use Topics   Alcohol use: Never   Drug use: No    Allergies  Allergen Reactions  Penicillins Hives    Whelps    Sulfa Antibiotics Rash    Current Outpatient Medications  Medication Sig Dispense Refill   mirabegron ER (MYRBETRIQ) 50 MG TB24 tablet Take 1 tablet (50 mg total) by mouth daily. 30 tablet 2   acetaminophen (TYLENOL) 650 MG CR tablet Take 650 mg by mouth every 8 (eight) hours as needed for pain.     aspirin EC 81 MG tablet Take 81 mg by mouth daily.     atorvastatin (LIPITOR) 20 MG tablet Take 20 mg by mouth daily.     calcium citrate-vitamin D (CITRACAL+D) 315-200 MG-UNIT tablet Take 1 tablet by mouth 2 (two) times daily.     Cholecalciferol (VITAMIN D) 2000 UNITS tablet Take 2,000 Units by mouth daily.     co-enzyme Q-10 30 MG capsule Take 30 mg by mouth 3 (three) times daily.      esomeprazole (NEXIUM) 20 MG capsule Take 20 mg by mouth daily at 12 noon.     famotidine (PEPCID) 20 MG tablet Take 20 mg by mouth daily as  needed for heartburn or indigestion.      ferrous sulfate 324 MG TBEC Take 324 mg by mouth daily with breakfast.     FIBER PO Take 1 tablet by mouth daily.     gabapentin (NEURONTIN) 100 MG capsule 3 tabs po qhs and 2 tabs in am - may titrate up to 3 tabs in am 180 capsule 3   levothyroxine (SYNTHROID) 88 MCG tablet Take 88 mcg by mouth daily before breakfast.     loratadine (CLARITIN) 10 MG tablet Take 1 tablet (10 mg total) by mouth daily. 60 tablet 1   Multiple Vitamin (MULTIVITAMIN WITH MINERALS) TABS tablet Take 1 tablet by mouth daily.     nabumetone (RELAFEN) 750 MG tablet Take 1 tablet (750 mg total) by mouth 2 (two) times daily.     Omega-3 Fatty Acids (FISH OIL) 1200 MG CAPS Take 1,200 mg by mouth daily.     spironolactone (ALDACTONE) 25 MG tablet Take 0.5 tablets (12.5 mg total) by mouth daily. 15 tablet 3   triamcinolone cream (KENALOG) 0.1 % Apply 1 application topically daily. 30 g 1   No current facility-administered medications for this visit.    Review of Systems Review of Systems  Constitutional: Negative.   Genitourinary:  Positive for enuresis and frequency (and urge incontinence). Negative for vaginal bleeding, vaginal discharge and vaginal pain.   Blood pressure 132/84, pulse 68, weight 184 lb (83.5 kg).  Physical Exam Physical Exam      Comments: Vulvar skin pale, some erythema posterior right introitus, vaginal atrophy noted, cuff intact no blood or discharge. Some improvement noted today compared to last examination Skin: Data Reviewed Office notes  Assessment Chronic vulvitis - Plan: triamcinolone cream (KENALOG) 0.1 %  Urge incontinence of urine - Plan: mirabegron ER (MYRBETRIQ) 50 MG TB24 tablet   Plan Treat sx of urge incontinence  Meds ordered this encounter  Medications   mirabegron ER (MYRBETRIQ) 50 MG TB24 tablet    Sig: Take 1 tablet (50 mg total) by mouth daily.    Dispense:  30 tablet    Refill:  2   triamcinolone cream (KENALOG) 0.1 %     Sig: Apply 1 application topically daily.    Dispense:  30 g    Refill:  1   RTC 2 months    Emeterio Reeve 08/23/2021, 11:46 AM

## 2021-08-30 DIAGNOSIS — M1711 Unilateral primary osteoarthritis, right knee: Secondary | ICD-10-CM | POA: Diagnosis not present

## 2021-08-30 DIAGNOSIS — M1712 Unilateral primary osteoarthritis, left knee: Secondary | ICD-10-CM | POA: Diagnosis not present

## 2021-09-20 DIAGNOSIS — C50912 Malignant neoplasm of unspecified site of left female breast: Secondary | ICD-10-CM | POA: Diagnosis not present

## 2021-10-04 DIAGNOSIS — M1711 Unilateral primary osteoarthritis, right knee: Secondary | ICD-10-CM | POA: Diagnosis not present

## 2021-10-11 DIAGNOSIS — M1711 Unilateral primary osteoarthritis, right knee: Secondary | ICD-10-CM | POA: Diagnosis not present

## 2021-10-18 DIAGNOSIS — M1711 Unilateral primary osteoarthritis, right knee: Secondary | ICD-10-CM | POA: Diagnosis not present

## 2021-10-18 DIAGNOSIS — M1712 Unilateral primary osteoarthritis, left knee: Secondary | ICD-10-CM | POA: Diagnosis not present

## 2021-10-18 DIAGNOSIS — M545 Low back pain, unspecified: Secondary | ICD-10-CM | POA: Diagnosis not present

## 2021-10-19 ENCOUNTER — Ambulatory Visit: Payer: Medicare Other | Admitting: Obstetrics & Gynecology

## 2021-10-19 VITALS — BP 128/80 | HR 78 | Ht 61.0 in | Wt 186.0 lb

## 2021-10-19 DIAGNOSIS — N763 Subacute and chronic vulvitis: Secondary | ICD-10-CM

## 2021-10-19 DIAGNOSIS — N3941 Urge incontinence: Secondary | ICD-10-CM

## 2021-10-19 NOTE — Progress Notes (Signed)
Patient ID: Julie Thompson, female   DOB: [birth year], 76 y.o.   MRN: [id number] ? ?Chief Complaint  ?Patient presents with  ? Follow-up  ? ? ?HPI ?Julie Thompson is a 76 y.o. female.  No obstetric history on file. ?She has no vaginal irritation after using Kenalog cream and no nocturia on current medication but she has incontinence and has to wear a pad.  ?HPI ? ?Past Medical History:  ?Diagnosis Date  ? Arthritis 2012  ? Asthma   ? no problems now  ? CHF (congestive heart failure) ([place])   ? Family history of basal cell carcinoma   ? Family history of breast cancer   ? Family history of colon cancer   ? Family history of kidney cancer   ? Family history of leukemia   ? Family history of peritoneal cancer   ? GERD (gastroesophageal reflux disease)   ? H/O hiatal hernia   ? Hyperlipidemia   ? Hypothyroidism   ? IBS (irritable bowel syndrome)   ? lt breast ca dx'd 07/10/19  ? Personal history of chemotherapy   ? Personal history of radiation therapy   ? ? ?Past Surgical History:  ?Procedure Laterality Date  ? ABDOMINAL HYSTERECTOMY    ? age 33  ? BREAST BIOPSY Left 07/09/2019  ? x3  ? BREAST BIOPSY Left 07/10/2019  ? BREAST BIOPSY Bilateral 07/26/2019  ? x2 on rt, 1 left  ? BREAST LUMPECTOMY Left 01/16/2020  ? BREAST LUMPECTOMY WITH RADIOACTIVE SEED AND AXILLARY LYMPH NODE DISSECTION Left 01/16/2020  ? Procedure: LEFT BREAST LUMPECTOMY WITH RADIOACTIVE SEED AND TARGETED AXILLARY LYMPH NODE DISSECTION;  Surgeon: Donnie Mesa, MD;  Location: [facility];  Service: General;  Laterality: Left;  LMA  ? BREAST SURGERY    ? DILATION AND CURETTAGE OF UTERUS    ? 2 miscarriages age 51 & 74  ? FLEXIBLE SIGMOIDOSCOPY N/A 10/22/2013  ? Procedure: FLEXIBLE SIGMOIDOSCOPY;  Surgeon: Garlan Fair, MD;  Location: [facility];  Service: Endoscopy;  Laterality: N/A;  ? KNEE ARTHROSCOPY Left   ? PORT-A-CATH REMOVAL N/A 01/16/2020  ? Procedure: REMOVAL PORT-A-CATH;  Surgeon: Donnie Mesa, MD;  Location: [facility];  Service: General;   Laterality: N/A;  ? PORTACATH PLACEMENT Right 07/29/2019  ? Procedure: INSERTION PORT-A-CATH WITH ULTRASOUND;  Surgeon: Donnie Mesa, MD;  Location: [facility];  Service: General;  Laterality: Right;  ? PORTACATH PLACEMENT N/A 02/13/2020  ? Procedure: INSERTION PORT-A-CATH WITH ULTRASOUND GUIDANCE;  Surgeon: Donnie Mesa, MD;  Location: [facility];  Service: General;  Laterality: N/A;  ? TONSILLECTOMY    ? age 36  ? ? ?Family History  ?Problem Relation Age of Onset  ? Breast cancer Maternal Aunt   ?     dx. in her 73s  ? Cancer Mother 64  ?     peritoneal cancer, death certificate lists ovarian cancer  ? Colon cancer Paternal Uncle   ?     dx. late 12s or early 66s  ? Basal cell carcinoma Sister 45  ?     a second diagnosed in her 70s  ? Kidney cancer Maternal Grandmother 3  ? Diabetes Paternal Grandmother   ? Heart Problems Paternal Grandmother   ? Leukemia Paternal Uncle   ?     dx. early 69s  ? ? ?Social History ?Social History  ? ?Tobacco Use  ? Smoking status: Never  ? Smokeless tobacco: Never  ?Vaping Use  ? Vaping Use: Never used  ?  Substance Use Topics  ? Alcohol use: Never  ? Drug use: No  ? ? ?Allergies  ?Allergen Reactions  ? Penicillins Hives  ?  Whelps   ? Sulfa Antibiotics Rash  ? ? ?Current Outpatient Medications  ?Medication Sig Dispense Refill  ? acetaminophen (TYLENOL) 650 MG CR tablet Take 650 mg by mouth every 8 (eight) hours as needed for pain.    ? aspirin EC 81 MG tablet Take 81 mg by mouth daily.    ? atorvastatin (LIPITOR) 20 MG tablet Take 20 mg by mouth daily.    ? calcium citrate-vitamin D (CITRACAL+D) 315-200 MG-UNIT tablet Take 1 tablet by mouth 2 (two) times daily.    ? Cholecalciferol (VITAMIN D) 2000 UNITS tablet Take 2,000 Units by mouth daily.    ? co-enzyme Q-10 30 MG capsule Take 30 mg by mouth 3 (three) times daily.     ? esomeprazole (NEXIUM) 20 MG capsule Take 20 mg by mouth daily at 12 noon.    ? famotidine (PEPCID) 20 MG tablet Take 20 mg by  mouth daily as needed for heartburn or indigestion.     ? ferrous sulfate 324 MG TBEC Take 324 mg by mouth daily with breakfast.    ? FIBER PO Take 1 tablet by mouth daily.    ? gabapentin (NEURONTIN) 100 MG capsule 3 tabs po qhs and 2 tabs in am - may titrate up to 3 tabs in am 180 capsule 3  ? levothyroxine (SYNTHROID) 88 MCG tablet Take 88 mcg by mouth daily before breakfast.    ? loratadine (CLARITIN) 10 MG tablet Take 1 tablet (10 mg total) by mouth daily. 60 tablet 1  ? mirabegron ER (MYRBETRIQ) 50 MG TB24 tablet Take 1 tablet (50 mg total) by mouth daily. 30 tablet 2  ? Multiple Vitamin (MULTIVITAMIN WITH MINERALS) TABS tablet Take 1 tablet by mouth daily.    ? nabumetone (RELAFEN) 750 MG tablet Take 1 tablet (750 mg total) by mouth 2 (two) times daily.    ? Omega-3 Fatty Acids (FISH OIL) 1200 MG CAPS Take 1,200 mg by mouth daily.    ? spironolactone (ALDACTONE) 25 MG tablet Take 0.5 tablets (12.5 mg total) by mouth daily. 15 tablet 3  ? triamcinolone cream (KENALOG) 0.1 % Apply 1 application topically daily. 30 g 1  ? ?No current facility-administered medications for this visit.  ? ? ?Review of Systems ?Review of Systems  ?Constitutional: Negative.   ?Cardiovascular: Negative.   ?Gastrointestinal: Negative.   ?Genitourinary:   ?     Incontinence  ?Musculoskeletal:  Positive for arthralgias and back pain.  ? ?Blood pressure 128/80, pulse 78, height '5\' 1"'$  (1.549 m), weight 186 lb (84.4 kg). ? ?Physical Exam ?Physical Exam ?Constitutional:   ?   Appearance: She is obese. She is not ill-appearing.  ?Cardiovascular:  ?   Rate and Rhythm: Normal rate.  ?Pulmonary:  ?   Effort: Pulmonary effort is normal.  ?Neurological:  ?   Mental Status: She is alert.  ? ?Physical Exam ?Physical Exam ? ?Still has pale skin and milder form extends bilaterally to the anus and erythema posterior right vulva ?  ?Data Reviewed ? ? ?Assessment ?Chronic vulvitis - Plan: Ambulatory referral to Urogynecology ? ?Urge incontinence of urine  - Plan: Ambulatory referral to Urogynecology ? ? ?Plan ?Refer to Dr. Wannetta Sender. If she needs a biopsy this could be done by her or me. Continue Myrbetriq  ? ? ? ?Emeterio Reeve ?10/19/2021, 8:38 AM ? ? ? ?

## 2021-10-19 NOTE — Progress Notes (Signed)
F/U chronic vulvitis, RX Kenalog ?No vaginal complaints ?

## 2021-10-27 DIAGNOSIS — R5383 Other fatigue: Secondary | ICD-10-CM | POA: Diagnosis not present

## 2021-10-27 DIAGNOSIS — E039 Hypothyroidism, unspecified: Secondary | ICD-10-CM | POA: Diagnosis not present

## 2021-10-27 DIAGNOSIS — E7800 Pure hypercholesterolemia, unspecified: Secondary | ICD-10-CM | POA: Diagnosis not present

## 2021-10-27 DIAGNOSIS — M170 Bilateral primary osteoarthritis of knee: Secondary | ICD-10-CM | POA: Diagnosis not present

## 2021-10-27 DIAGNOSIS — R350 Frequency of micturition: Secondary | ICD-10-CM | POA: Diagnosis not present

## 2021-10-27 DIAGNOSIS — D509 Iron deficiency anemia, unspecified: Secondary | ICD-10-CM | POA: Diagnosis not present

## 2021-11-09 DIAGNOSIS — E7800 Pure hypercholesterolemia, unspecified: Secondary | ICD-10-CM | POA: Diagnosis not present

## 2021-11-09 DIAGNOSIS — I10 Essential (primary) hypertension: Secondary | ICD-10-CM | POA: Diagnosis not present

## 2021-11-09 DIAGNOSIS — M170 Bilateral primary osteoarthritis of knee: Secondary | ICD-10-CM | POA: Diagnosis not present

## 2021-11-15 ENCOUNTER — Other Ambulatory Visit: Payer: Self-pay | Admitting: Internal Medicine

## 2021-11-15 DIAGNOSIS — Z9889 Other specified postprocedural states: Secondary | ICD-10-CM

## 2021-11-15 DIAGNOSIS — M170 Bilateral primary osteoarthritis of knee: Secondary | ICD-10-CM | POA: Diagnosis not present

## 2021-11-25 ENCOUNTER — Other Ambulatory Visit: Payer: Self-pay | Admitting: Obstetrics & Gynecology

## 2021-11-25 DIAGNOSIS — N3941 Urge incontinence: Secondary | ICD-10-CM

## 2021-12-10 DIAGNOSIS — K2100 Gastro-esophageal reflux disease with esophagitis, without bleeding: Secondary | ICD-10-CM | POA: Diagnosis not present

## 2021-12-10 DIAGNOSIS — M170 Bilateral primary osteoarthritis of knee: Secondary | ICD-10-CM | POA: Diagnosis not present

## 2021-12-10 DIAGNOSIS — Z01810 Encounter for preprocedural cardiovascular examination: Secondary | ICD-10-CM | POA: Diagnosis not present

## 2021-12-10 DIAGNOSIS — E7800 Pure hypercholesterolemia, unspecified: Secondary | ICD-10-CM | POA: Diagnosis not present

## 2021-12-10 DIAGNOSIS — I10 Essential (primary) hypertension: Secondary | ICD-10-CM | POA: Diagnosis not present

## 2021-12-20 ENCOUNTER — Other Ambulatory Visit: Payer: Self-pay | Admitting: Internal Medicine

## 2021-12-20 DIAGNOSIS — E2839 Other primary ovarian failure: Secondary | ICD-10-CM

## 2021-12-23 ENCOUNTER — Ambulatory Visit
Admission: RE | Admit: 2021-12-23 | Discharge: 2021-12-23 | Disposition: A | Discharge status: Home / Self Care | Payer: Medicare Other | Source: Ambulatory Visit | Attending: Internal Medicine | Admitting: Internal Medicine

## 2021-12-23 DIAGNOSIS — Z9889 Other specified postprocedural states: Secondary | ICD-10-CM

## 2021-12-23 DIAGNOSIS — Z853 Personal history of malignant neoplasm of breast: Secondary | ICD-10-CM | POA: Diagnosis not present

## 2021-12-27 DIAGNOSIS — M1731 Unilateral post-traumatic osteoarthritis, right knee: Secondary | ICD-10-CM | POA: Diagnosis not present

## 2021-12-27 DIAGNOSIS — R262 Difficulty in walking, not elsewhere classified: Secondary | ICD-10-CM | POA: Diagnosis not present

## 2021-12-27 DIAGNOSIS — M25661 Stiffness of right knee, not elsewhere classified: Secondary | ICD-10-CM | POA: Diagnosis not present

## 2022-01-07 ENCOUNTER — Telehealth: Payer: Self-pay | Admitting: Hematology and Oncology

## 2022-01-07 DIAGNOSIS — M170 Bilateral primary osteoarthritis of knee: Secondary | ICD-10-CM | POA: Diagnosis not present

## 2022-01-07 NOTE — Telephone Encounter (Signed)
Per 7/6 inbasket, pt req to r/s and push out appt bc of upcoming knee replacement surgery

## 2022-01-11 DIAGNOSIS — M170 Bilateral primary osteoarthritis of knee: Secondary | ICD-10-CM | POA: Diagnosis not present

## 2022-01-11 DIAGNOSIS — E039 Hypothyroidism, unspecified: Secondary | ICD-10-CM | POA: Diagnosis not present

## 2022-01-11 DIAGNOSIS — E7800 Pure hypercholesterolemia, unspecified: Secondary | ICD-10-CM | POA: Diagnosis not present

## 2022-01-11 DIAGNOSIS — I10 Essential (primary) hypertension: Secondary | ICD-10-CM | POA: Diagnosis not present

## 2022-01-17 ENCOUNTER — Other Ambulatory Visit: Payer: Self-pay | Admitting: Hematology and Oncology

## 2022-01-20 DIAGNOSIS — G8918 Other acute postprocedural pain: Secondary | ICD-10-CM | POA: Diagnosis not present

## 2022-01-20 DIAGNOSIS — Z96651 Presence of right artificial knee joint: Secondary | ICD-10-CM | POA: Diagnosis not present

## 2022-01-20 DIAGNOSIS — M1711 Unilateral primary osteoarthritis, right knee: Secondary | ICD-10-CM | POA: Diagnosis not present

## 2022-01-21 DIAGNOSIS — Z7982 Long term (current) use of aspirin: Secondary | ICD-10-CM | POA: Diagnosis not present

## 2022-01-21 DIAGNOSIS — M1712 Unilateral primary osteoarthritis, left knee: Secondary | ICD-10-CM | POA: Diagnosis not present

## 2022-01-21 DIAGNOSIS — Z471 Aftercare following joint replacement surgery: Secondary | ICD-10-CM | POA: Diagnosis not present

## 2022-01-21 DIAGNOSIS — K5900 Constipation, unspecified: Secondary | ICD-10-CM | POA: Diagnosis not present

## 2022-01-21 DIAGNOSIS — Z96651 Presence of right artificial knee joint: Secondary | ICD-10-CM | POA: Diagnosis not present

## 2022-01-28 ENCOUNTER — Ambulatory Visit: Payer: Medicare Other

## 2022-01-28 ENCOUNTER — Other Ambulatory Visit: Payer: Medicare Other

## 2022-01-28 ENCOUNTER — Ambulatory Visit: Payer: Medicare Other | Admitting: Hematology and Oncology

## 2022-01-31 DIAGNOSIS — M25661 Stiffness of right knee, not elsewhere classified: Secondary | ICD-10-CM | POA: Diagnosis not present

## 2022-01-31 DIAGNOSIS — Z96651 Presence of right artificial knee joint: Secondary | ICD-10-CM | POA: Diagnosis not present

## 2022-01-31 DIAGNOSIS — R262 Difficulty in walking, not elsewhere classified: Secondary | ICD-10-CM | POA: Diagnosis not present

## 2022-02-02 DIAGNOSIS — R262 Difficulty in walking, not elsewhere classified: Secondary | ICD-10-CM | POA: Diagnosis not present

## 2022-02-02 DIAGNOSIS — M25661 Stiffness of right knee, not elsewhere classified: Secondary | ICD-10-CM | POA: Diagnosis not present

## 2022-02-02 DIAGNOSIS — Z96651 Presence of right artificial knee joint: Secondary | ICD-10-CM | POA: Diagnosis not present

## 2022-02-08 DIAGNOSIS — M25661 Stiffness of right knee, not elsewhere classified: Secondary | ICD-10-CM | POA: Diagnosis not present

## 2022-02-08 DIAGNOSIS — R262 Difficulty in walking, not elsewhere classified: Secondary | ICD-10-CM | POA: Diagnosis not present

## 2022-02-08 DIAGNOSIS — Z96651 Presence of right artificial knee joint: Secondary | ICD-10-CM | POA: Diagnosis not present

## 2022-02-10 DIAGNOSIS — R262 Difficulty in walking, not elsewhere classified: Secondary | ICD-10-CM | POA: Diagnosis not present

## 2022-02-10 DIAGNOSIS — M25661 Stiffness of right knee, not elsewhere classified: Secondary | ICD-10-CM | POA: Diagnosis not present

## 2022-02-10 DIAGNOSIS — Z96651 Presence of right artificial knee joint: Secondary | ICD-10-CM | POA: Diagnosis not present

## 2022-02-15 DIAGNOSIS — M25661 Stiffness of right knee, not elsewhere classified: Secondary | ICD-10-CM | POA: Diagnosis not present

## 2022-02-15 DIAGNOSIS — Z96651 Presence of right artificial knee joint: Secondary | ICD-10-CM | POA: Diagnosis not present

## 2022-02-15 DIAGNOSIS — R262 Difficulty in walking, not elsewhere classified: Secondary | ICD-10-CM | POA: Diagnosis not present

## 2022-02-17 DIAGNOSIS — R262 Difficulty in walking, not elsewhere classified: Secondary | ICD-10-CM | POA: Diagnosis not present

## 2022-02-17 DIAGNOSIS — Z96651 Presence of right artificial knee joint: Secondary | ICD-10-CM | POA: Diagnosis not present

## 2022-02-17 DIAGNOSIS — M25661 Stiffness of right knee, not elsewhere classified: Secondary | ICD-10-CM | POA: Diagnosis not present

## 2022-02-22 DIAGNOSIS — R262 Difficulty in walking, not elsewhere classified: Secondary | ICD-10-CM | POA: Diagnosis not present

## 2022-02-22 DIAGNOSIS — M25661 Stiffness of right knee, not elsewhere classified: Secondary | ICD-10-CM | POA: Diagnosis not present

## 2022-02-22 DIAGNOSIS — Z96651 Presence of right artificial knee joint: Secondary | ICD-10-CM | POA: Diagnosis not present

## 2022-02-24 DIAGNOSIS — Z96651 Presence of right artificial knee joint: Secondary | ICD-10-CM | POA: Diagnosis not present

## 2022-02-24 DIAGNOSIS — M25661 Stiffness of right knee, not elsewhere classified: Secondary | ICD-10-CM | POA: Diagnosis not present

## 2022-02-24 DIAGNOSIS — R262 Difficulty in walking, not elsewhere classified: Secondary | ICD-10-CM | POA: Diagnosis not present

## 2022-02-25 ENCOUNTER — Ambulatory Visit: Payer: Medicare Other | Admitting: Obstetrics and Gynecology

## 2022-03-01 DIAGNOSIS — R262 Difficulty in walking, not elsewhere classified: Secondary | ICD-10-CM | POA: Diagnosis not present

## 2022-03-01 DIAGNOSIS — Z96651 Presence of right artificial knee joint: Secondary | ICD-10-CM | POA: Diagnosis not present

## 2022-03-01 DIAGNOSIS — M25661 Stiffness of right knee, not elsewhere classified: Secondary | ICD-10-CM | POA: Diagnosis not present

## 2022-03-03 ENCOUNTER — Other Ambulatory Visit: Payer: Self-pay | Admitting: *Deleted

## 2022-03-03 ENCOUNTER — Other Ambulatory Visit: Payer: Self-pay

## 2022-03-03 ENCOUNTER — Inpatient Hospital Stay: Payer: Medicare Other | Attending: Hematology and Oncology

## 2022-03-03 ENCOUNTER — Inpatient Hospital Stay: Payer: Medicare Other | Admitting: Hematology and Oncology

## 2022-03-03 ENCOUNTER — Encounter: Payer: Self-pay | Admitting: Hematology and Oncology

## 2022-03-03 ENCOUNTER — Inpatient Hospital Stay: Payer: Medicare Other

## 2022-03-03 VITALS — BP 129/59 | HR 69 | Temp 97.5°F | Resp 16 | Ht 61.0 in | Wt 185.1 lb

## 2022-03-03 DIAGNOSIS — Z9221 Personal history of antineoplastic chemotherapy: Secondary | ICD-10-CM | POA: Insufficient documentation

## 2022-03-03 DIAGNOSIS — Z923 Personal history of irradiation: Secondary | ICD-10-CM | POA: Diagnosis not present

## 2022-03-03 DIAGNOSIS — C50512 Malignant neoplasm of lower-outer quadrant of left female breast: Secondary | ICD-10-CM | POA: Diagnosis not present

## 2022-03-03 DIAGNOSIS — Z171 Estrogen receptor negative status [ER-]: Secondary | ICD-10-CM

## 2022-03-03 DIAGNOSIS — M8580 Other specified disorders of bone density and structure, unspecified site: Secondary | ICD-10-CM | POA: Diagnosis not present

## 2022-03-03 DIAGNOSIS — Z853 Personal history of malignant neoplasm of breast: Secondary | ICD-10-CM | POA: Insufficient documentation

## 2022-03-03 LAB — CBC WITH DIFFERENTIAL (CANCER CENTER ONLY)
Abs Immature Granulocytes: 0.01 10*3/uL (ref 0.00–0.07)
Basophils Absolute: 0 10*3/uL (ref 0.0–0.1)
Basophils Relative: 1 %
Eosinophils Absolute: 0.3 10*3/uL (ref 0.0–0.5)
Eosinophils Relative: 6 %
HCT: 37.5 % (ref 36.0–46.0)
Hemoglobin: 12.5 g/dL (ref 12.0–15.0)
Immature Granulocytes: 0 %
Lymphocytes Relative: 18 %
Lymphs Abs: 1 10*3/uL (ref 0.7–4.0)
MCH: 32.5 pg (ref 26.0–34.0)
MCHC: 33.3 g/dL (ref 30.0–36.0)
MCV: 97.4 fL (ref 80.0–100.0)
Monocytes Absolute: 0.4 10*3/uL (ref 0.1–1.0)
Monocytes Relative: 7 %
Neutro Abs: 3.7 10*3/uL (ref 1.7–7.7)
Neutrophils Relative %: 68 %
Platelet Count: 175 10*3/uL (ref 150–400)
RBC: 3.85 MIL/uL — ABNORMAL LOW (ref 3.87–5.11)
RDW: 14 % (ref 11.5–15.5)
WBC Count: 5.4 10*3/uL (ref 4.0–10.5)
nRBC: 0 % (ref 0.0–0.2)

## 2022-03-03 LAB — CMP (CANCER CENTER ONLY)
ALT: 9 U/L (ref 0–44)
AST: 16 U/L (ref 15–41)
Albumin: 4.2 g/dL (ref 3.5–5.0)
Alkaline Phosphatase: 63 U/L (ref 38–126)
Anion gap: 5 (ref 5–15)
BUN: 21 mg/dL (ref 8–23)
CO2: 27 mmol/L (ref 22–32)
Calcium: 9.5 mg/dL (ref 8.9–10.3)
Chloride: 110 mmol/L (ref 98–111)
Creatinine: 0.73 mg/dL (ref 0.44–1.00)
GFR, Estimated: >60 mL/min (ref >60)
Glucose, Bld: 97 mg/dL (ref 70–99)
Potassium: 4.3 mmol/L (ref 3.5–5.1)
Sodium: 142 mmol/L (ref 135–145)
Total Bilirubin: 0.4 mg/dL (ref 0.3–1.2)
Total Protein: 6.8 g/dL (ref 6.5–8.1)

## 2022-03-03 NOTE — Progress Notes (Signed)
Julie Thompson  [phone] [phone]     ID: Julie Thompson DOB: Jan 05, 1946  MR#: [id number]  GDJ#:[id number]  Patient Care Team: Leeroy Cha, MD as PCP - General (Internal Medicine) Rockwell Germany, RN as Oncology Nurse Navigator Mauro Kaufmann, RN as Oncology Nurse Navigator Eppie Gibson, MD as Attending Physician (Radiation Oncology) Donnie Mesa, MD as Consulting Physician (General Surgery) Magrinat, Virgie Dad, MD (Inactive) as Consulting Physician (Oncology) Melrose Nakayama, MD as Consulting Physician (Orthopedic Surgery) Bensimhon, Shaune Pascal, MD as Consulting Physician (Cardiology) Benay Pike, MD OTHER MD:  CHIEF COMPLAINT: 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  She is here for follow up.  She has been doing remarkably well since her last visit.  She had a knee replacement about 6 weeks ago, recovering.  She had a recent mammogram which did not show any evidence of malignancy. Bone density with a T score of -1.6.  She got a dose of Prolia.  She denies any new dental concerns. Rest of the pertinent 10 point ROS reviewed and negative.  REVIEW OF SYSTEMS: Julie Thompson    COVID 65 VACCINATION STATUS: Status post [place] x1, July 2021, no booster as of 08/04/2020    HISTORY OF CURRENT ILLNESS: From the original intake note:  Julie Thompson had routine screening mammography on 06/21/2019 showing a possible abnormality in the left breast. She underwent left diagnostic mammography with tomography and left breast ultrasonography at [facility] on 07/01/2019 showing: breast density category C; either 2 adjacent masses or 1 complex dumbbell-shaped mass containing calcifications in the left breast at 6 o'clock, the largest of  which measures 2.1 cm; chest wall invasion not excluded; 1-2 mm group of calcifications just anterior to the mass(es); single abnormal lymph node in the left axilla; vascular calcifications in anterior left breast.  Accordingly on 07/09/2019 she proceeded to biopsy of the left breast areas in question. The pathology from this procedure ([id number]) showed:  1. Left Breast, 5:30, posterior  - invasive ductal carcinoma with necrosis, grade 3  - Prognostic indicators significant for: estrogen receptor, 0% negative and progesterone receptor, 0% negative. Proliferation marker Ki67 at 60%. HER2 negative by immunohistochemistry (0). 2. Left Breast, 5:30, posterior  - invasive ductal carcinoma with necrosis, grade 3  - ductal carcinoma in situ, intermediate grade 3. Left Axilla, lymph node (1)  - metastatic carcinoma involving a lymph node  And additional biopsy of the anterior vascular calcifications in the left breast was performed on 07/10/2019. Pathology [id number]) showed: fibrocystic changes with calcifications; no malignancy identified.  This was felt to be concordant.  The patient's subsequent history is as detailed below.   PAST MEDICAL HISTORY: Past Medical History:  Diagnosis Date   Arthritis 2012   Asthma    no problems now   CHF (congestive heart failure) ([facility])    Family history of basal cell carcinoma    Family history of breast cancer    Family history of colon cancer    Family history of kidney cancer    Family history of leukemia    Family history of peritoneal cancer    GERD (gastroesophageal reflux disease)    H/O hiatal hernia    Hyperlipidemia    Hypothyroidism    IBS (irritable bowel syndrome)    lt breast ca dx'd 07/10/19   Personal history  of chemotherapy    Personal history of radiation therapy     PAST SURGICAL HISTORY: Past Surgical History:  Procedure Laterality Date   ABDOMINAL HYSTERECTOMY     age 19   BREAST BIOPSY Left 07/09/2019   x3   BREAST BIOPSY  Left 07/10/2019   BREAST BIOPSY Bilateral 07/26/2019   x2 on rt, 1 left   BREAST LUMPECTOMY Left 01/16/2020   BREAST LUMPECTOMY WITH RADIOACTIVE SEED AND AXILLARY LYMPH NODE DISSECTION Left 01/16/2020   Procedure: LEFT BREAST LUMPECTOMY WITH RADIOACTIVE SEED AND TARGETED AXILLARY LYMPH NODE DISSECTION;  Surgeon: Donnie Mesa, MD;  Location: [facility];  Service: General;  Laterality: Left;  LMA   BREAST SURGERY     DILATION AND CURETTAGE OF UTERUS     2 miscarriages age 58 & 57   [place] N/A 10/22/2013   Procedure: FLEXIBLE SIGMOIDOSCOPY;  Surgeon: Garlan Fair, MD;  Location: [facility];  Service: Endoscopy;  Laterality: N/A;   KNEE ARTHROSCOPY Left    PORT-A-CATH REMOVAL N/A 01/16/2020   Procedure: REMOVAL PORT-A-CATH;  Surgeon: Donnie Mesa, MD;  Location: [facility];  Service: General;  Laterality: N/A;   PORTACATH PLACEMENT Right 07/29/2019   Procedure: INSERTION PORT-A-CATH WITH ULTRASOUND;  Surgeon: Donnie Mesa, MD;  Location: [facility];  Service: General;  Laterality: Right;   PORTACATH PLACEMENT N/A 02/13/2020   Procedure: INSERTION PORT-A-CATH WITH ULTRASOUND GUIDANCE;  Surgeon: Donnie Mesa, MD;  Location: [facility];  Service: General;  Laterality: N/A;   TONSILLECTOMY     age 45    FAMILY HISTORY: Family History  Problem Relation Age of Onset   Breast cancer Maternal Aunt        dx. in her 65s   Cancer Mother 26       peritoneal cancer, death certificate lists ovarian cancer   Colon cancer Paternal Uncle        dx. late 42s or early 24s   Basal cell carcinoma Sister 76       a second diagnosed in her 63s   Kidney cancer Maternal Grandmother 71   Diabetes Paternal Grandmother    Heart Problems Paternal Grandmother    Leukemia Paternal Uncle        dx. early 73s  Patient's father is currently living at age 71 as of 08-21-2019. Patient's mother died from ovarian cancer at age 26. She was diagnosed at age 42. The patient  reports breast cancer in a maternal aunt, diagnosed in her 56's. The patient also noted colon cancer in a paternal uncle and kidney cancer in her maternal grandmother.  She has 1 sister.   GYNECOLOGIC HISTORY:  No LMP recorded. Patient has had a hysterectomy. Menarche: [age] years old Age at first live birth: 76 years old [place] P 2 LMP age 43 (with hysterectomy) Contraceptive: never used HRT used for 10-12 years  Hysterectomy? Yes, age 46 BSO? 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  Husband Julie Saxon "Julie Thompson" Laurann Montana Sr. is a retired Psychologist, prison and probation services. She lives at home with husband Julie Thompson. She has two children from a prior marriage. Son Julie Thompson, age 60, works as a Games developer in [place], [place]. Son Julie Thompson, age 29, works as a Programme researcher, broadcasting/film/video in [place], [place]. The patient has 4 grandchildren and 4 great-grandchildren. She attends [employer].    ADVANCED DIRECTIVES: In the absence of any documentation to the contrary, the patient's spouse is their HCPOA.  HEALTH MAINTENANCE: Social History   Tobacco Use   Smoking status: Never   Smokeless tobacco: Never  Vaping Use   Vaping Use: Never used  Substance Use Topics   Alcohol use: Never   Drug use: No     Colonoscopy: 09/2013, with CT virtual 11/2013  PAP: none on file, s/p hysterectomy  Bone density: 04/2001, -0.5   Allergies  Allergen Reactions   Penicillins Hives    Whelps    Sulfa Antibiotics Rash    Current Outpatient Medications  Medication Sig Dispense Refill   acetaminophen (TYLENOL) 650 MG CR tablet Take 650 mg by mouth every 8 (eight) hours as needed for pain.     aspirin EC 81 MG tablet Take 81 mg by mouth daily.     atorvastatin (LIPITOR) 20 MG tablet Take 20 mg by mouth daily.     calcium citrate-vitamin D (CITRACAL+D) 315-200 MG-UNIT tablet Take 1 tablet by mouth 2 (two) times daily.     Cholecalciferol (VITAMIN D) 2000 UNITS tablet Take  2,000 Units by mouth daily.     co-enzyme Q-10 30 MG capsule Take 30 mg by mouth 3 (three) times daily.      esomeprazole (NEXIUM) 20 MG capsule Take 20 mg by mouth daily at 12 noon.     famotidine (PEPCID) 20 MG tablet Take 20 mg by mouth daily as needed for heartburn or indigestion.      ferrous sulfate 324 MG TBEC Take 324 mg by mouth daily with breakfast.     FIBER PO Take 1 tablet by mouth daily.     gabapentin (NEURONTIN) 100 MG capsule TAKE 3 CAPSULES BY MOUTH EVERY NIGHT AT BEDTIME AND 2 IN THE MORNING- MAY TITRATE UP TO 3 CAPSULE IN MORNING 180 capsule 3   levothyroxine (SYNTHROID) 88 MCG tablet Take 88 mcg by mouth daily before breakfast.     loratadine (CLARITIN) 10 MG tablet Take 1 tablet (10 mg total) by mouth daily. 60 tablet 1   mirabegron ER (MYRBETRIQ) 50 MG TB24 tablet Take 1 tablet (50 mg total) by mouth daily. 30 tablet 2   Multiple Vitamin (MULTIVITAMIN WITH MINERALS) TABS tablet Take 1 tablet by mouth daily.     nabumetone (RELAFEN) 750 MG tablet Take 1 tablet (750 mg total) by mouth 2 (two) times daily.     Omega-3 Fatty Acids (FISH OIL) 1200 MG CAPS Take 1,200 mg by mouth daily.     spironolactone (ALDACTONE) 25 MG tablet Take 0.5 tablets (12.5 mg total) by mouth daily. 15 tablet 3   triamcinolone cream (KENALOG) 0.1 % Apply 1 application topically daily. 30 g 1   No current facility-administered medications for this visit.    OBJECTIVE: White woman  Vitals:   03/03/22 1336  BP: (!) 129/59  Pulse: 69  Resp: 16  Temp: (!) 97.5 F (36.4 C)  SpO2: 97%     Body mass index is 34.97 kg/m.   Wt Readings from Last 3 Encounters:  03/03/22 185 lb 1.6 oz (84 kg)  10/19/21 186 lb (84.4 kg)  08/23/21 184 lb (83.5 kg)  ECOG FS:1 - Symptomatic but completely ambulatory  General appearance: Alert, oriented and in no acute distress. Breasts: Significant external architectural distortion of the left breast as seen below.  No other palpable masses or regional  adenopathy  Inferior view left breast 06/14/2021, no change from the below picture on exam today.   LAB RESULTS:  CMP     Component Value Date/Time   NA  141 08/02/2021 1107   K 4.5 08/02/2021 1107   CL 106 08/02/2021 1107   CO2 31 08/02/2021 1107   GLUCOSE 66 (L) 08/02/2021 1107   BUN 23 08/02/2021 1107   CREATININE 0.98 08/02/2021 1107   CREATININE 0.79 09/28/2020 1335   CALCIUM 9.3 08/02/2021 1107   PROT 6.6 08/02/2021 1107   ALBUMIN 3.9 08/02/2021 1107   AST 22 08/02/2021 1107   AST 45 (H) 09/28/2020 1335   ALT 19 08/02/2021 1107   ALT 30 09/28/2020 1335   ALKPHOS 63 08/02/2021 1107   BILITOT 0.5 08/02/2021 1107   BILITOT 0.7 09/28/2020 1335   GFRNONAA >60 08/02/2021 1107   GFRNONAA >60 09/28/2020 1335   GFRAA >60 03/30/2020 0910   GFRAA >60 07/17/2019 0823    No results found for: "TOTALPROTELP", "ALBUMINELP", "A1GS", "A2GS", "BETS", "BETA2SER", "GAMS", "MSPIKE", "SPEI"  Lab Results  Component Value Date   WBC 5.4 03/03/2022   NEUTROABS 3.7 03/03/2022   HGB 12.5 03/03/2022   HCT 37.5 03/03/2022   MCV 97.4 03/03/2022   PLT 175 03/03/2022    No results found for: "LABCA2"  No components found for: "GBEEFE071"  No results for input(s): "INR" in the last 168 hours.  No results found for: "LABCA2"  No results found for: "QRF758"  No results found for: "CAN125"  No results found for: "CAN153"  No results found for: "CA2729"  No components found for: "HGQUANT"  No results found for: "CEA1", "CEA" / No results found for: "CEA1", "CEA"   No results found for: "AFPTUMOR"  No results found for: "CHROMOGRNA"  No results found for: "KPAFRELGTCHN", "LAMBDASER", "KAPLAMBRATIO" (kappa/lambda light chains)  No results found for: "HGBA", "HGBA2QUANT", "HGBFQUANT", "HGBSQUAN" (Hemoglobinopathy evaluation)   No results found for: "LDH"  No results found for: "IRON", "TIBC", "IRONPCTSAT" (Iron and TIBC)  No results found for: "FERRITIN"  Urinalysis     Component Value Date/Time   COLORURINE AMBER (A) 09/28/2020 1309   APPEARANCEUR CLEAR 09/28/2020 1309   LABSPEC 1.023 09/28/2020 1309   PHURINE 5.0 09/28/2020 1309   GLUCOSEU NEGATIVE 09/28/2020 1309   HGBUR NEGATIVE 09/28/2020 1309   BILIRUBINUR NEGATIVE 09/28/2020 1309   KETONESUR 5 (A) 09/28/2020 1309   PROTEINUR NEGATIVE 09/28/2020 1309   NITRITE NEGATIVE 09/28/2020 1309   LEUKOCYTESUR LARGE (A) 09/28/2020 1309    STUDIES: No results found.   ELIGIBLE FOR AVAILABLE RESEARCH PROTOCOL: no  ASSESSMENT: 76 y.o. Julie Thompson woman status post left breast lower outer quadrant biopsy 07/09/2019 for a clinical T2 N1, stage IIIB invasive ductal carcinoma, grade 3, triple negative, with an MIB-1 of 60%  (a) chest CT scan and bone scan 07/31/2019 showed no evidence of metastatic disease  (b) MRI biopsy of 2 additional areas in the left breast (central, 07/10/2019, and at 8:30 o'clock on 07/26/2019) are both benign and concordant  (c) biopsy of 2 suspicious areas in the right breast on 07/26/2019 (9:00 and lower outer quadrant) were benign and concordant  (d) PET scan 11/21/2019 shows a 1.2 cm nodule in the left breast with an SUV max of 2.7, aortic calcification, but no lung liver or bone lesions, no evidence of metastases   (1) neoadjuvant chemotherapy consisting of cyclophosphamide and doxorubicin in dose dense fashion x4 started 07/30/2019, completed 09/10/2019, followed by weekly carboplatin and paclitaxel x12 started 09/24/2019  (a) cycle 6 carboplatin/paclitaxel delayed 2 weeks because of neutropenia: granix added  (b) cycle 12 of carboplatin/paclitaxel omitted secondary to persistent low counts  (2) left lumpectomy with targeted axillary  lymph node dissection 01/16/2020 showed a ypTIc ypN1 invasive ductal carcinoma, grade 3  (a) a single axillary lymph node was removed  (b) repeat prognostic panel on the surgical specimen showed the tumor to be estrogen and progesterone receptor  negative, MIB-110%, and HER-2 positive with a signals ratio of 2.97 and a copy number per cell 4.90.  (3) adjuvant radiation completed 04/01/2020.  (4) genetics testing 07/23/2019 through the STAT Breast cancer panel offered by [facility] found no deleterious mutations in ATM, BRCA1, BRCA2, CDH1, CHEK2, PALB2, PTEN, STK11 and TP53.  Additional testing through the Common Hereditary Cancers Panel offered by [facility] also found no deleterious mutations in IPC, ATM, AXIN2, BARD1, BMPR1A, BRCA1, BRCA2, BRIP1, CDH1, CDK4, CDKN2A (p14ARF), CDKN2A (p16INK4a), CHEK2, CTNNA1, DICER1, EPCAM (Deletion/duplication testing only), GREM1 (promoter region deletion/duplication testing only), KIT, MEN1, MLH1, MSH2, MSH3, MSH6, MUTYH, NBN, NF1, NHTL1, PALB2, PDGFRA, PMS2, POLD1, POLE, PTEN, RAD50, RAD51C, RAD51D, RNF43, SDHB, SDHC, SDHD, SMAD4, SMARCA4. STK11, TP53, TSC1, TSC2, and VHL.  The following genes were evaluated for sequence changes only: SDHA and HOXB13 c.251G>A variant only.   (a) 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   (5) adjuvant T-DM1/ Kadcyla started 02/18/2020, repeated Q3W x 14, completed 11/17/2020  (a) echo 02/12/2020 shows an ejection fraction in the 60-65% range  (b) echo 05/14/2020 shows an ejection fraction in the 55-60% range  (c) echo 08/10/2018 shows an ejection fraction in the 60 to 65% range  (d) switched to trastuzumab for last 3 doses secondary to side effects  (e) echo 11/11/2020 showed an ejection fraction in the 60-65% range  (6) denosumab/Prolia started 01/12/2021  (a) bone density 12/22/2020 showed a T score of -1.6  (7) postmenopausal bleeding: referral placed 06/14/2021   PLAN:  Julie Thompson is now here for follow-up. Since last visit she had knee replacement and she is currently recovering.  She denies any other health changes.  She is not interested in any further correction of her breast surgery at this time.  Physical examination  today without any new concerns.  Since her T score is only -1.6, we have discussed about omitting Prolia for osteopenia since she is not on any antiestrogen therapy concurrently.  She is very thrilled about this.  She has another bone density scan scheduled which we can monitor. Last mammogram June 2023 with no evidence of malignancy.  Next mammogram due June 2024.  Total time spent: 30 minutes  *Total Encounter Time as defined by the Centers for Medicare and Medicaid Services includes, in addition to the face-to-face time of a patient visit (documented in the note above) non-face-to-face time: obtaining and reviewing outside history, ordering and reviewing medications, tests or procedures, care coordination (communications with other health care professionals or caregivers) and documentation in the medical record.

## 2022-03-04 DIAGNOSIS — Z96651 Presence of right artificial knee joint: Secondary | ICD-10-CM | POA: Diagnosis not present

## 2022-03-04 DIAGNOSIS — M25661 Stiffness of right knee, not elsewhere classified: Secondary | ICD-10-CM | POA: Diagnosis not present

## 2022-03-04 DIAGNOSIS — R262 Difficulty in walking, not elsewhere classified: Secondary | ICD-10-CM | POA: Diagnosis not present

## 2022-03-08 DIAGNOSIS — R262 Difficulty in walking, not elsewhere classified: Secondary | ICD-10-CM | POA: Diagnosis not present

## 2022-03-08 DIAGNOSIS — Z96651 Presence of right artificial knee joint: Secondary | ICD-10-CM | POA: Diagnosis not present

## 2022-03-08 DIAGNOSIS — M25661 Stiffness of right knee, not elsewhere classified: Secondary | ICD-10-CM | POA: Diagnosis not present

## 2022-03-10 DIAGNOSIS — Z96651 Presence of right artificial knee joint: Secondary | ICD-10-CM | POA: Diagnosis not present

## 2022-03-10 DIAGNOSIS — R262 Difficulty in walking, not elsewhere classified: Secondary | ICD-10-CM | POA: Diagnosis not present

## 2022-03-10 DIAGNOSIS — M25661 Stiffness of right knee, not elsewhere classified: Secondary | ICD-10-CM | POA: Diagnosis not present

## 2022-03-11 ENCOUNTER — Ambulatory Visit: Payer: Medicare Other | Admitting: Hematology and Oncology

## 2022-03-11 ENCOUNTER — Other Ambulatory Visit: Payer: Medicare Other

## 2022-03-11 ENCOUNTER — Ambulatory Visit: Payer: Medicare Other

## 2022-03-15 DIAGNOSIS — M25661 Stiffness of right knee, not elsewhere classified: Secondary | ICD-10-CM | POA: Diagnosis not present

## 2022-03-15 DIAGNOSIS — R262 Difficulty in walking, not elsewhere classified: Secondary | ICD-10-CM | POA: Diagnosis not present

## 2022-03-15 DIAGNOSIS — Z96651 Presence of right artificial knee joint: Secondary | ICD-10-CM | POA: Diagnosis not present

## 2022-03-17 DIAGNOSIS — Z96651 Presence of right artificial knee joint: Secondary | ICD-10-CM | POA: Diagnosis not present

## 2022-03-17 DIAGNOSIS — R262 Difficulty in walking, not elsewhere classified: Secondary | ICD-10-CM | POA: Diagnosis not present

## 2022-03-17 DIAGNOSIS — M25661 Stiffness of right knee, not elsewhere classified: Secondary | ICD-10-CM | POA: Diagnosis not present

## 2022-03-28 DIAGNOSIS — M25562 Pain in left knee: Secondary | ICD-10-CM | POA: Diagnosis not present

## 2022-04-22 ENCOUNTER — Other Ambulatory Visit: Payer: Self-pay | Admitting: Hematology and Oncology

## 2022-04-27 DIAGNOSIS — M25561 Pain in right knee: Secondary | ICD-10-CM | POA: Diagnosis not present

## 2022-05-30 DIAGNOSIS — M79644 Pain in right finger(s): Secondary | ICD-10-CM | POA: Diagnosis not present

## 2022-06-01 DIAGNOSIS — J029 Acute pharyngitis, unspecified: Secondary | ICD-10-CM | POA: Diagnosis not present

## 2022-06-01 DIAGNOSIS — U071 COVID-19: Secondary | ICD-10-CM | POA: Diagnosis not present

## 2022-06-02 DIAGNOSIS — U071 COVID-19: Secondary | ICD-10-CM | POA: Diagnosis not present

## 2022-06-02 DIAGNOSIS — E7800 Pure hypercholesterolemia, unspecified: Secondary | ICD-10-CM | POA: Diagnosis not present

## 2022-06-02 DIAGNOSIS — I10 Essential (primary) hypertension: Secondary | ICD-10-CM | POA: Diagnosis not present

## 2022-06-08 ENCOUNTER — Other Ambulatory Visit: Payer: Medicare Other

## 2022-06-28 DIAGNOSIS — J4521 Mild intermittent asthma with (acute) exacerbation: Secondary | ICD-10-CM | POA: Diagnosis not present

## 2022-06-28 DIAGNOSIS — L729 Follicular cyst of the skin and subcutaneous tissue, unspecified: Secondary | ICD-10-CM | POA: Diagnosis not present

## 2022-07-11 DIAGNOSIS — M7989 Other specified soft tissue disorders: Secondary | ICD-10-CM | POA: Diagnosis not present

## 2022-07-11 DIAGNOSIS — Z23 Encounter for immunization: Secondary | ICD-10-CM | POA: Diagnosis not present

## 2022-07-11 DIAGNOSIS — Z8616 Personal history of COVID-19: Secondary | ICD-10-CM | POA: Diagnosis not present

## 2022-07-11 DIAGNOSIS — C50112 Malignant neoplasm of central portion of left female breast: Secondary | ICD-10-CM | POA: Diagnosis not present

## 2022-07-11 DIAGNOSIS — J40 Bronchitis, not specified as acute or chronic: Secondary | ICD-10-CM | POA: Diagnosis not present

## 2022-07-13 ENCOUNTER — Other Ambulatory Visit: Payer: Self-pay | Admitting: Internal Medicine

## 2022-07-13 ENCOUNTER — Encounter: Payer: Self-pay | Admitting: Oncology

## 2022-07-13 DIAGNOSIS — M7989 Other specified soft tissue disorders: Secondary | ICD-10-CM

## 2022-07-14 ENCOUNTER — Ambulatory Visit
Admission: RE | Admit: 2022-07-14 | Discharge: 2022-07-14 | Disposition: A | Discharge status: Home / Self Care | Payer: Medicare Other | Source: Ambulatory Visit | Attending: Internal Medicine | Admitting: Internal Medicine

## 2022-07-14 DIAGNOSIS — R2231 Localized swelling, mass and lump, right upper limb: Secondary | ICD-10-CM | POA: Diagnosis not present

## 2022-07-14 DIAGNOSIS — M7989 Other specified soft tissue disorders: Secondary | ICD-10-CM

## 2022-07-20 ENCOUNTER — Other Ambulatory Visit: Payer: Self-pay | Admitting: Internal Medicine

## 2022-07-20 DIAGNOSIS — C50112 Malignant neoplasm of central portion of left female breast: Secondary | ICD-10-CM | POA: Diagnosis not present

## 2022-07-20 DIAGNOSIS — R5383 Other fatigue: Secondary | ICD-10-CM | POA: Diagnosis not present

## 2022-07-20 DIAGNOSIS — M7989 Other specified soft tissue disorders: Secondary | ICD-10-CM | POA: Diagnosis not present

## 2022-07-21 ENCOUNTER — Ambulatory Visit
Admission: RE | Admit: 2022-07-21 | Discharge: 2022-07-21 | Disposition: A | Discharge status: Home / Self Care | Payer: Medicare Other | Source: Ambulatory Visit | Attending: Internal Medicine | Admitting: Internal Medicine

## 2022-07-21 DIAGNOSIS — M85641 Other cyst of bone, right hand: Secondary | ICD-10-CM | POA: Diagnosis not present

## 2022-07-21 DIAGNOSIS — M7989 Other specified soft tissue disorders: Secondary | ICD-10-CM

## 2022-07-21 MED ORDER — GADOPICLENOL 0.5 MMOL/ML IV SOLN
7.5000 mL | Freq: Once | INTRAVENOUS | Status: AC | PRN
Start: 2022-07-21 — End: 2022-07-21
  Administered 2022-07-21: 7.5 mL via INTRAVENOUS

## 2022-07-22 ENCOUNTER — Ambulatory Visit (INDEPENDENT_AMBULATORY_CARE_PROVIDER_SITE_OTHER): Payer: Medicare Other | Admitting: Obstetrics and Gynecology

## 2022-07-22 ENCOUNTER — Encounter: Payer: Self-pay | Admitting: Obstetrics and Gynecology

## 2022-07-22 VITALS — BP 141/72 | HR 98 | Ht 59.25 in | Wt 187.0 lb

## 2022-07-22 DIAGNOSIS — R350 Frequency of micturition: Secondary | ICD-10-CM | POA: Diagnosis not present

## 2022-07-22 DIAGNOSIS — N3281 Overactive bladder: Secondary | ICD-10-CM

## 2022-07-22 DIAGNOSIS — R159 Full incontinence of feces: Secondary | ICD-10-CM | POA: Diagnosis not present

## 2022-07-22 LAB — POCT URINALYSIS DIPSTICK
Bilirubin, UA: NEGATIVE
Blood, UA: NEGATIVE
Glucose, UA: NEGATIVE
Ketones, UA: NEGATIVE
Leukocytes, UA: NEGATIVE
Nitrite, UA: NEGATIVE
Protein, UA: POSITIVE — AB
Spec Grav, UA: >=1.03 — AB (ref 1.010–1.025)
Urobilinogen, UA: 0.2 E.U./dL
pH, UA: 5.5 (ref 5.0–8.0)

## 2022-07-22 MED ORDER — TROSPIUM CHLORIDE ER 60 MG PO CP24: 1.0000 | ORAL_CAPSULE | Freq: Every day | ORAL | 5 refills | Status: AC

## 2022-07-22 NOTE — Progress Notes (Signed)
[facility] New Patient Evaluation and Consultation  Referring Provider: Woodroe Mode, MD PCP: Leeroy Cha, MD Date of Service: 07/22/2022  SUBJECTIVE Chief Complaint: New Patient (Initial Visit) Julie Thompson is a 77 y.o. female is here for vaginal irritation.)  History of Present Illness: Julie Thompson is a 77 y.o. White or Caucasian female seen in consultation at the request of Dr. Roselie Awkward for evaluation of incontinence.    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 Currently on Myrbetriq.   Urinary Symptoms: Leaks urine with with urgency and while asleep. Denies SUI.  Leaks 5+ time(s) per day.  Pad use:  adult diapers  She is bothered by her UI symptoms. She was on myrbetriq '50mg'$  for about 3 months and did not see much improvement so she is no longer taking.   Day time voids 5.  Nocturia: 3 times per night to void- sometimes wakes up wet. Voiding dysfunction: she empties her bladder well.  does not use a catheter to empty bladder.  When urinating, she feels she has no difficulties Drinks: 1 cup decaf coffee in AM, 3 bottles water per day, 1 bottle diet pepsi per day  UTIs:  0  UTI's in the last year.   Denies history of blood in urine and kidney or bladder stones  Pelvic Organ Prolapse Symptoms:                  She Denies a feeling of a bulge the vaginal area.   Bowel Symptom: Bowel movements: every other day Stool consistency: soft  Straining: no.  Splinting: no.  Incomplete evacuation: no.  She Admits to accidental bowel leakage / fecal incontinence  Occurs: not often- maybe once a month  Consistency with leakage: soft- when she has an IBS flare Bowel regimen: takes psyllium once a day Last colonoscopy: Date 2015  Sexual Function Sexually active: no.    Pelvic Pain Denies pelvic pain   Past Medical History:  Past Medical History:  Diagnosis Date   Arthritis  2012   Asthma    no problems now   Breast cancer ([place]) dx'd 07/10/19   CHF (congestive heart failure) (HCC)    Family history of basal cell carcinoma    Family history of breast cancer    Family history of colon cancer    Family history of kidney cancer    Family history of leukemia    Family history of peritoneal cancer    GERD (gastroesophageal reflux disease)    H/O hiatal hernia    Hyperlipidemia    Hypothyroidism    IBS (irritable bowel syndrome)    Personal history of chemotherapy    Personal history of radiation therapy      Past Surgical History:   Past Surgical History:  Procedure Laterality Date   ABDOMINAL HYSTERECTOMY     age 25   BREAST BIOPSY Left 07/09/2019   x3   BREAST BIOPSY Left 07/10/2019   BREAST BIOPSY Bilateral 07/26/2019   x2 on rt, 1 left   BREAST LUMPECTOMY Left 01/16/2020   BREAST LUMPECTOMY WITH RADIOACTIVE SEED AND AXILLARY LYMPH NODE DISSECTION Left 01/16/2020   Procedure: LEFT BREAST LUMPECTOMY WITH RADIOACTIVE SEED AND TARGETED AXILLARY LYMPH NODE DISSECTION;  Surgeon: Donnie Mesa, MD;  Location: [facility];  Service: General;  Laterality: Left;  LMA   BREAST SURGERY     DILATION AND CURETTAGE OF UTERUS     2 miscarriages age 36 &  [place] 10/22/2013   Procedure: FLEXIBLE SIGMOIDOSCOPY;  Surgeon: Garlan Fair, MD;  Location: [facility];  Service: Endoscopy;  Laterality: N/A;   KNEE ARTHROSCOPY Left    PORT-A-CATH REMOVAL N/A 01/16/2020   Procedure: REMOVAL PORT-A-CATH;  Surgeon: Donnie Mesa, MD;  Location: [facility];  Service: General;  Laterality: N/A;   PORTACATH PLACEMENT Right 07/29/2019   Procedure: INSERTION PORT-A-CATH WITH ULTRASOUND;  Surgeon: Donnie Mesa, MD;  Location: [facility];  Service: General;  Laterality: Right;   PORTACATH PLACEMENT N/A 02/13/2020   Procedure: INSERTION PORT-A-CATH WITH ULTRASOUND GUIDANCE;  Surgeon: Donnie Mesa, MD;  Location: [facility];   Service: General;  Laterality: N/A;   TONSILLECTOMY     age 65     Past OB/GYN History: OB History  Gravida Para Term Preterm AB Living  '4 2 2   2 2  '$ SAB IAB Ectopic Multiple Live Births  2            # Outcome Date GA Lbr Len/2nd Weight Sex Delivery Anes PTL Lv  4 Term           3 Term           2 SAB           1 SAB             Vaginal deliveries: 2,  Forceps/ Vacuum deliveries: 0, Cesarean section: 0 S/p hysterctomy   Medications: She has a current medication list which includes the following prescription(s): acetaminophen, atorvastatin, calcium citrate-vitamin d, vitamin d, co-enzyme q-10, esomeprazole, famotidine, ferrous sulfate, fiber, gabapentin, levothyroxine, loratadine, multivitamin with minerals, nabumetone, fish oil, spironolactone, trospium chloride, and [DISCONTINUED] prochlorperazine.   Allergies: Patient is allergic to penicillins and sulfa antibiotics.   Social History:  Social History   Tobacco Use   Smoking status: Never   Smokeless tobacco: Never  Vaping Use   Vaping Use: Never used  Substance Use Topics   Alcohol use: Never   Drug use: No    Relationship status: married She lives with husband.   She is not employed. Regular exercise: No History of abuse: No  Family History:   Family History  Problem Relation Age of Onset   Cancer Mother 91       peritoneal cancer, death certificate lists ovarian cancer   Heart Problems Sister    Basal cell carcinoma Sister 76       a second diagnosed in her 37s   Breast cancer Maternal Aunt        dx. in her 41s   Colon cancer Paternal Uncle        dx. late 26s or early 58s   Leukemia Paternal Uncle        dx. early 22s   Kidney cancer Maternal Grandmother 71   Diabetes Paternal Grandmother    Heart Problems Paternal Grandmother      Review of Systems: Review of Systems  Constitutional:  Negative for fever, malaise/fatigue and weight loss.  Respiratory:  Negative for cough, shortness of breath  and wheezing.   Cardiovascular:  Negative for chest pain, palpitations and leg swelling.  Gastrointestinal:  Negative for abdominal pain and blood in stool.  Genitourinary:  Negative for dysuria.  Musculoskeletal:  Negative for myalgias.  Skin:  Negative for rash.  Neurological:  Negative for dizziness and headaches.  Endo/Heme/Allergies:  Does not bruise/bleed easily.  Psychiatric/Behavioral:  Negative for depression. The patient is not nervous/anxious.  OBJECTIVE Physical Exam: Vitals:   07/22/22 0930  BP: (!) 141/72  Pulse: 98  Weight: 187 lb (84.8 kg)  Height: 4' 11.25" (1.505 m)    Physical Exam Constitutional:      General: She is not in acute distress. Pulmonary:     Effort: Pulmonary effort is normal.  Abdominal:     General: There is no distension.     Palpations: Abdomen is soft.     Tenderness: There is no abdominal tenderness. There is no rebound.  Musculoskeletal:        General: No swelling. Normal range of motion.  Skin:    General: Skin is warm and dry.     Findings: No rash.  Neurological:     Mental Status: She is alert and oriented to person, place, and time.  Psychiatric:        Mood and Affect: Mood normal.        Behavior: Behavior normal.      GU / Detailed Urogynecologic Evaluation:  Pelvic Exam: Normal external female genitalia- vulvar skin appears atrophic; Bartholin's and Skene's glands normal in appearance; urethral meatus normal in appearance, no urethral masses or discharge.   CST: negative  s/p hysterectomy: Speculum exam reveals normal vaginal mucosa with  atrophy and normal vaginal cuff.  Adnexa normal adnexa.    Pelvic floor strength I/V, puborectalis II/V external anal sphincter I/V  Pelvic floor musculature: Right levator non-tender, Right obturator non-tender, Left levator non-tender, Left obturator non-tender  POP-Q:   POP-Q  -2.5                                            Aa   -2.5                                            Ba  -6                                              C   3.5                                            Gh  3                                            Pb  6.5                                            tvl   -2.5                                            Ap  -2.5  Bp                                                 D      Rectal Exam:  Normal sphincter tone, no distal rectocele, enterocoele not present, no rectal masses, no sign of dyssynergia when asking the patient to bear down.  Post-Void Residual (PVR) by Bladder Scan: In order to evaluate bladder emptying, we discussed obtaining a postvoid residual and she agreed to this procedure.  Procedure: The ultrasound unit was placed on the patient's abdomen in the suprapubic region after the patient had voided. A PVR of 54 ml was obtained by bladder scan.  Laboratory Results: POC urine: positive protein  ASSESSMENT AND PLAN Ms. Cotugno is a 77 y.o. with:  1. Overactive bladder   2. Urinary frequency   3. Incontinence of feces, unspecified fecal incontinence type    OAB - We discussed the symptoms of overactive bladder (OAB), which include urinary urgency, urinary frequency, nocturia, with or without urge incontinence.  While we do not know the exact etiology of OAB, several treatment options exist. We discussed management including behavioral therapy (decreasing bladder irritants, urge suppression strategies, timed voids, bladder retraining), physical therapy, medication; for refractory cases posterior tibial nerve stimulation, sacral neuromodulation, and intravesical botulinum toxin injection.  - Prescribed trospium '60mg'$  ER daily. For anticholinergic medications, we discussed the potential side effects of anticholinergics including dry eyes, dry mouth, constipation, cognitive impairment and urinary retention. - Will also work on decreasing caffeine/ soda intake.  - Handout  provided on other options which she is considering.  - recommended zinc based cream on vulva for wetness protection  2. Accidental Bowel Leakage:  - Treatment options include anti-diarrhea medication (loperamide/ Imodium OTC or prescription lomotil), fiber supplements, physical therapy, and possible sacral neuromodulation or surgery.   - Recommended daily psyllium supplementation. She is considering pelvic PT  Return 6 weeks  Jaquita Folds, MD

## 2022-07-22 NOTE — Patient Instructions (Addendum)
Today we talked about ways to manage bladder urgency such as altering your diet to avoid irritative beverages and foods (bladder diet) as well as attempting to decrease stress and other exacerbating factors.    The Most Bothersome Foods* The Least Bothersome Foods*  Coffee - Regular & Decaf Tea - caffeinated Carbonated beverages - cola, non-colas, diet & caffeine-free Alcohols - Beer, Red Wine, White Wine, Champagne Fruits - Grapefruit, [place], Orange, [employer] - Cranberry, Grapefruit, Orange, Pineapple Vegetables - Tomato & Tomato Products Flavor Enhancers - Hot peppers, Spicy foods, Chili, Horseradish, Vinegar, Monosodium glutamate (MSG) Artificial Sweeteners - NutraSweet, Sweet 'N Low, Equal (sweetener), Saccharin Ethnic foods - [place], [place], [place] food [employer] - low-fat & whole Fruits - Bananas, Blueberries, Honeydew melon, Pears, Raisins, Watermelon Vegetables - Broccoli, Brussels Sprouts, [place], Carrots, Cauliflower, [place], Cucumber, Mushrooms, Peas, Radishes, Squash, Zucchini, White potatoes, Sweet potatoes & yams Poultry - Chicken, Eggs, [place], [employer] - Beef, Programmer, multimedia, Lamb Seafood - Shrimp, [place] fish, Salmon Grains - Oat, Rice Snacks - Pretzels, Popcorn  *Lissa Morales et al. Diet and its role in interstitial cystitis/bladder pain syndrome (IC/BPS) and comorbid conditions. [place] 2012 Jan 11.   We discussed the symptoms of overactive bladder (OAB), which include urinary urgency, urinary frequency, night-time urination, with or without urge incontinence.  We discussed management including behavioral therapy (decreasing bladder irritants by following a bladder diet, urge suppression strategies, timed voids, bladder retraining), physical therapy, medication; and for refractory cases posterior tibial nerve stimulation, sacral neuromodulation, and intravesical botulinum toxin injection.   Prescribed Trospium '60mg'$  daily.   For vulvar irritation,  recommend zinc based cream on the vulva to help protect against wetness.    Accidental Bowel Leakage: Our goal is to achieve formed bowel movements daily or every-other-day without leakage.  You may need to try different combinations of the following options to find what works best for you.  Some management options include: Dietary changes (more leafy greens, vegetables and fruits; less processed foods) Fiber supplementation (Metamucil or something with psyllium as active ingredient) Over-the-counter imodium (tablets or liquid) to help solidify the stool and prevent leakage of stool.

## 2022-08-02 DIAGNOSIS — R2231 Localized swelling, mass and lump, right upper limb: Secondary | ICD-10-CM | POA: Diagnosis not present

## 2022-08-10 ENCOUNTER — Other Ambulatory Visit: Payer: Self-pay

## 2022-08-10 DIAGNOSIS — M67441 Ganglion, right hand: Secondary | ICD-10-CM | POA: Diagnosis not present

## 2022-08-10 DIAGNOSIS — R2231 Localized swelling, mass and lump, right upper limb: Secondary | ICD-10-CM | POA: Diagnosis not present

## 2022-08-23 DIAGNOSIS — R2231 Localized swelling, mass and lump, right upper limb: Secondary | ICD-10-CM | POA: Diagnosis not present

## 2022-08-29 DIAGNOSIS — J029 Acute pharyngitis, unspecified: Secondary | ICD-10-CM | POA: Diagnosis not present

## 2022-08-29 DIAGNOSIS — Z03818 Encounter for observation for suspected exposure to other biological agents ruled out: Secondary | ICD-10-CM | POA: Diagnosis not present

## 2022-09-01 ENCOUNTER — Inpatient Hospital Stay: Payer: Medicare Other | Attending: Hematology and Oncology | Admitting: Hematology and Oncology

## 2022-09-01 ENCOUNTER — Other Ambulatory Visit: Payer: Self-pay

## 2022-09-01 VITALS — BP 143/84 | HR 74 | Temp 97.8°F | Resp 16 | Ht 59.0 in | Wt 193.4 lb

## 2022-09-01 DIAGNOSIS — M810 Age-related osteoporosis without current pathological fracture: Secondary | ICD-10-CM | POA: Diagnosis not present

## 2022-09-01 DIAGNOSIS — Z9221 Personal history of antineoplastic chemotherapy: Secondary | ICD-10-CM | POA: Insufficient documentation

## 2022-09-01 DIAGNOSIS — Z853 Personal history of malignant neoplasm of breast: Secondary | ICD-10-CM | POA: Diagnosis not present

## 2022-09-01 DIAGNOSIS — C50512 Malignant neoplasm of lower-outer quadrant of left female breast: Secondary | ICD-10-CM

## 2022-09-01 DIAGNOSIS — Z171 Estrogen receptor negative status [ER-]: Secondary | ICD-10-CM

## 2022-09-01 DIAGNOSIS — Z923 Personal history of irradiation: Secondary | ICD-10-CM | POA: Diagnosis not present

## 2022-09-01 NOTE — Progress Notes (Signed)
[facility]  [phone]) [phone] [phone]) [phone]     ID: Julie Thompson DOB: October 27, 1945  MR#: [id number]  [id number]  Patient Care Team: Leeroy Cha, MD as PCP - General (Internal Medicine) Rockwell Germany, RN as Oncology Nurse Navigator Mauro Kaufmann, RN as Oncology Nurse Navigator Eppie Gibson, MD as Attending Physician (Radiation Oncology) Donnie Mesa, MD as Consulting Physician (General Surgery) Melrose Nakayama, MD as Consulting Physician (Orthopedic Surgery) Bensimhon, Shaune Pascal, MD as Consulting Physician (Cardiology) Benay Pike, MD as Consulting Physician (Hematology and Oncology) Benay Pike, MD  CHIEF COMPLAINT: 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  She is here for follow up.  She tells me that her dad passed away in 06-23-2023 and she was his primary caregiver.  Since he passed away, she feels less motivated to get up and go and do stuff.  She otherwise denies any changes in her breast.  She is due for her mammogram in June 2024. She got Prolia in the past asked however since she was osteopenic and since she is not on any concomitant antiestrogen therapy, we have held it.  She has another bone density scan scheduled in May 2024. Rest of the pertinent 10 point ROS reviewed and negative.  REVIEW OF SYSTEMS:    COVID 19 VACCINATION STATUS: Status post [place] x1, July 2021, no booster as of 08/04/2020    HISTORY OF CURRENT ILLNESS: From the original intake note:  Julie Thompson had routine screening mammography on 06/21/2019 showing a possible abnormality in the left breast. She underwent left diagnostic mammography with tomography and left breast ultrasonography at [facility] on 07/01/2019 showing: breast  density category C; either 2 adjacent masses or 1 complex dumbbell-shaped mass containing calcifications in the left breast at 6 o'clock, the largest of which measures 2.1 cm; chest wall invasion not excluded; 1-2 mm group of calcifications just anterior to the mass(es); single abnormal lymph node in the left axilla; vascular calcifications in anterior left breast.  Accordingly on 07/09/2019 she proceeded to biopsy of the left breast areas in question. The pathology from this procedure ([id number]) showed:  1. Left Breast, 5:30, posterior  - invasive ductal carcinoma with necrosis, grade 3  - Prognostic indicators significant for: estrogen receptor, 0% negative and progesterone receptor, 0% negative. Proliferation marker Ki67 at 60%. HER2 negative by immunohistochemistry (0). 2. Left Breast, 5:30, posterior  - invasive ductal carcinoma with necrosis, grade 3  - ductal carcinoma in situ, intermediate grade 3. Left Axilla, lymph node (1)  - metastatic carcinoma involving a lymph node  And additional biopsy of the anterior vascular calcifications in the left breast was performed on 07/10/2019. Pathology [id number]) showed: fibrocystic changes with calcifications; no malignancy identified.  This was felt to be concordant.  The patient's subsequent history is as detailed below.   PAST MEDICAL HISTORY: Past Medical History:  Diagnosis Date   Arthritis 2012   Asthma    no problems now   Breast cancer ([place]) dx'd 07/10/19   CHF (congestive heart failure) (HCC)    Family history of basal cell carcinoma    Family history of breast cancer    Family history of colon cancer    Family history of kidney cancer    Family history of leukemia    Family history of peritoneal cancer    GERD (  gastroesophageal reflux disease)    H/O hiatal hernia    Hyperlipidemia    Hypothyroidism    IBS (irritable bowel syndrome)    Personal history of chemotherapy    Personal history of radiation therapy     PAST  SURGICAL HISTORY: Past Surgical History:  Procedure Laterality Date   ABDOMINAL HYSTERECTOMY     age 34   BREAST BIOPSY Left 07/09/2019   x3   BREAST BIOPSY Left 07/10/2019   BREAST BIOPSY Bilateral 07/26/2019   x2 on rt, 1 left   BREAST LUMPECTOMY Left 01/16/2020   BREAST LUMPECTOMY WITH RADIOACTIVE SEED AND AXILLARY LYMPH NODE DISSECTION Left 01/16/2020   Procedure: LEFT BREAST LUMPECTOMY WITH RADIOACTIVE SEED AND TARGETED AXILLARY LYMPH NODE DISSECTION;  Surgeon: Donnie Mesa, MD;  Location: [facility];  Service: General;  Laterality: Left;  LMA   BREAST SURGERY     DILATION AND CURETTAGE OF UTERUS     2 miscarriages age 64 & 3   [place] N/A 10/22/2013   Procedure: FLEXIBLE SIGMOIDOSCOPY;  Surgeon: Garlan Fair, MD;  Location: [facility];  Service: Endoscopy;  Laterality: N/A;   KNEE ARTHROSCOPY Left    PORT-A-CATH REMOVAL N/A 01/16/2020   Procedure: REMOVAL PORT-A-CATH;  Surgeon: Donnie Mesa, MD;  Location: [facility];  Service: General;  Laterality: N/A;   PORTACATH PLACEMENT Right 07/29/2019   Procedure: INSERTION PORT-A-CATH WITH ULTRASOUND;  Surgeon: Donnie Mesa, MD;  Location: [facility];  Service: General;  Laterality: Right;   PORTACATH PLACEMENT N/A 02/13/2020   Procedure: INSERTION PORT-A-CATH WITH ULTRASOUND GUIDANCE;  Surgeon: Donnie Mesa, MD;  Location: [facility];  Service: General;  Laterality: N/A;   TONSILLECTOMY     age 44    FAMILY HISTORY: Family History  Problem Relation Age of Onset   Cancer Mother 79       peritoneal cancer, death certificate lists ovarian cancer   Heart Problems Sister    Basal cell carcinoma Sister 74       a second diagnosed in her 18s   Breast cancer Maternal Aunt        dx. in her 59s   Colon cancer Paternal Uncle        dx. late 72s or early 13s   Leukemia Paternal Uncle        dx. early 6s   Kidney cancer Maternal Grandmother 71   Diabetes Paternal Grandmother    Heart  Problems Paternal Grandmother   Patient's father is currently living at age 44 as of 08-04-19. Patient's mother died from ovarian cancer at age 28. She was diagnosed at age 77. The patient reports breast cancer in a maternal aunt, diagnosed in her 62's. The patient also noted colon cancer in a paternal uncle and kidney cancer in her maternal grandmother.  She has 1 sister.   GYNECOLOGIC HISTORY:  No LMP recorded. Patient has had a hysterectomy. Menarche: [age] years old Age at first live birth: 77 years old [place] P 2 LMP age 4 (with hysterectomy) Contraceptive: never used HRT used for 10-12 years  Hysterectomy? Yes, age 65 BSO? 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  Husband [name] is a retired Psychologist, prison and probation services. She lives at home with husband Butch. She has two children from a prior marriage. Son [name], age 49, works as a Games developer in [place], [place]. Son [name], age 67, works as a Programme researcher, broadcasting/film/video in [place], [place].  The patient has 4 grandchildren and 4 great-grandchildren. She attends [employer].    ADVANCED DIRECTIVES: In the absence of any documentation to the contrary, the patient's spouse is their HCPOA.    HEALTH MAINTENANCE: Social History   Tobacco Use   Smoking status: Never   Smokeless tobacco: Never  Vaping Use   Vaping Use: Never used  Substance Use Topics   Alcohol use: Never   Drug use: No     Colonoscopy: 09/2013, with CT virtual 11/2013  PAP: none on file, s/p hysterectomy  Bone density: 04/2001, -0.5   Allergies  Allergen Reactions   Penicillins Hives and Other (See Comments)    [place]helps   Sulfa Antibiotics Rash    Current Outpatient Medications  Medication Sig Dispense Refill   acetaminophen (TYLENOL) 650 MG CR tablet Take 650 mg by mouth every 8 (eight) hours as needed for pain.     atorvastatin (LIPITOR) 20 MG tablet Take 20 mg by mouth daily.     calcium  citrate-vitamin D (CITRACAL+D) 315-200 MG-UNIT tablet Take 1 tablet by mouth 2 (two) times daily.     Cholecalciferol (VITAMIN D) 2000 UNITS tablet Take 2,000 Units by mouth daily.     co-enzyme Q-10 30 MG capsule Take 30 mg by mouth 3 (three) times daily.      esomeprazole (NEXIUM) 20 MG capsule Take 20 mg by mouth daily at 12 noon.     famotidine (PEPCID) 20 MG tablet Take 20 mg by mouth daily as needed for heartburn or indigestion.      ferrous sulfate 324 MG TBEC Take 324 mg by mouth daily with breakfast.     FIBER PO Take 1 tablet by mouth daily.     gabapentin (NEURONTIN) 100 MG capsule TAKE 3 CAPSULES BY MOUTH EVERY NIGHT AT BEDTIME AND 2 CAPSULES IN THE MORNING( MAY TITRATE UP TO 3 CAPSULES IN MORNING) 180 capsule 3   levothyroxine (SYNTHROID) 88 MCG tablet Take 88 mcg by mouth daily before breakfast.     loratadine (CLARITIN) 10 MG tablet Take 1 tablet (10 mg total) by mouth daily. 60 tablet 1   Multiple Vitamin (MULTIVITAMIN WITH MINERALS) TABS tablet Take 1 tablet by mouth daily.     nabumetone (RELAFEN) 750 MG tablet Take 1 tablet (750 mg total) by mouth 2 (two) times daily.     Omega-3 Fatty Acids (FISH OIL) 1200 MG CAPS Take 1,200 mg by mouth daily.     spironolactone (ALDACTONE) 25 MG tablet Take 0.5 tablets (12.5 mg total) by mouth daily. 15 tablet 3   Trospium Chloride 60 MG CP24 Take 1 capsule (60 mg total) by mouth daily. 30 capsule 5   No current facility-administered medications for this visit.    OBJECTIVE: White woman  Vitals:   09/01/22 1319  BP: (!) 143/84  Pulse: 74  Resp: 16  Temp: 97.8 F (36.6 C)  SpO2: 99%     Body mass index is 39.06 kg/m.   Wt Readings from Last 3 Encounters:  09/01/22 193 lb 6.4 oz (87.7 kg)  07/22/22 187 lb (84.8 kg)  03/03/22 185 lb 1.6 oz (84 kg)  ECOG FS:1 - Symptomatic but completely ambulatory  General appearance: Alert, oriented and in no acute distress. Breasts: Significant external architectural distortion of the left  breast as seen below.  No other palpable masses or regional adenopathy  Inferior view left breast 06/14/2021, no change from the below picture on exam today.   No other palpable breast masses  or regional adenopathy. LAB RESULTS:  CMP     Component Value Date/Time   NA 142 03/03/2022 1309   K 4.3 03/03/2022 1309   CL 110 03/03/2022 1309   CO2 27 03/03/2022 1309   GLUCOSE 97 03/03/2022 1309   BUN 21 03/03/2022 1309   CREATININE 0.73 03/03/2022 1309   CALCIUM 9.5 03/03/2022 1309   PROT 6.8 03/03/2022 1309   ALBUMIN 4.2 03/03/2022 1309   AST 16 03/03/2022 1309   ALT 9 03/03/2022 1309   ALKPHOS 63 03/03/2022 1309   BILITOT 0.4 03/03/2022 1309   GFRNONAA >60 03/03/2022 1309   GFRAA >60 03/30/2020 0910   GFRAA >60 07/17/2019 0823    No results found for: "TOTALPROTELP", "ALBUMINELP", "A1GS", "A2GS", "BETS", "BETA2SER", "GAMS", "MSPIKE", "SPEI"  Lab Results  Component Value Date   WBC 5.4 03/03/2022   NEUTROABS 3.7 03/03/2022   HGB 12.5 03/03/2022   HCT 37.5 03/03/2022   MCV 97.4 03/03/2022   PLT 175 03/03/2022    No results found for: "LABCA2"  No components found for: "[id number]"  No results for input(s): "INR" in the last 168 hours.  No results found for: "LABCA2"  No results found for: "[id number]"  No results found for: "CAN125"  No results found for: "CAN153"  No results found for: "CA2729"  No components found for: "HGQUANT"  No results found for: "CEA1", "CEA" / No results found for: "CEA1", "CEA"   No results found for: "AFPTUMOR"  No results found for: "CHROMOGRNA"  No results found for: "KPAFRELGTCHN", "LAMBDASER", "KAPLAMBRATIO" (kappa/lambda light chains)  No results found for: "HGBA", "HGBA2QUANT", "HGBFQUANT", "HGBSQUAN" (Hemoglobinopathy evaluation)   No results found for: "LDH"  No results found for: "IRON", "TIBC", "IRONPCTSAT" (Iron and TIBC)  No results found for: "FERRITIN"  Urinalysis    Component Value Date/Time   COLORURINE  AMBER (A) 09/28/2020 1309   APPEARANCEUR CLEAR 09/28/2020 1309   LABSPEC 1.023 09/28/2020 1309   PHURINE 5.0 09/28/2020 1309   GLUCOSEU NEGATIVE 09/28/2020 1309   HGBUR NEGATIVE 09/28/2020 1309   BILIRUBINUR Negative 07/22/2022 1035   KETONESUR 5 (A) 09/28/2020 1309   PROTEINUR Positive (A) 07/22/2022 1035   PROTEINUR NEGATIVE 09/28/2020 1309   UROBILINOGEN 0.2 07/22/2022 1035   NITRITE Negative 07/22/2022 1035   NITRITE NEGATIVE 09/28/2020 1309   LEUKOCYTESUR Negative 07/22/2022 1035   LEUKOCYTESUR LARGE (A) 09/28/2020 1309    STUDIES: No results found.   ELIGIBLE FOR AVAILABLE RESEARCH PROTOCOL: no  ASSESSMENT: 77 y.o. [place] woman status post left breast lower outer quadrant biopsy 07/09/2019 for a clinical T2 N1, stage IIIB invasive ductal carcinoma, grade 3, triple negative, with an MIB-1 of 60%  (a) chest CT scan and bone scan 07/31/2019 showed no evidence of metastatic disease  (b) MRI biopsy of 2 additional areas in the left breast (central, 07/10/2019, and at 8:30 o'clock on 07/26/2019) are both benign and concordant  (c) biopsy of 2 suspicious areas in the right breast on 07/26/2019 (9:00 and lower outer quadrant) were benign and concordant  (d) PET scan 11/21/2019 shows a 1.2 cm nodule in the left breast with an SUV max of 2.7, aortic calcification, but no lung liver or bone lesions, no evidence of metastases   (1) neoadjuvant chemotherapy consisting of cyclophosphamide and doxorubicin in dose dense fashion x4 started 07/30/2019, completed 09/10/2019, followed by weekly carboplatin and paclitaxel x12 started 09/24/2019  (a) cycle 6 carboplatin/paclitaxel delayed 2 weeks because of neutropenia: granix added  (b) cycle 12 of carboplatin/paclitaxel omitted secondary to  persistent low counts  (2) left lumpectomy with targeted axillary lymph node dissection 01/16/2020 showed a ypTIc ypN1 invasive ductal carcinoma, grade 3  (a) a single axillary lymph node was removed  (b)  repeat prognostic panel on the surgical specimen showed the tumor to be estrogen and progesterone receptor negative, MIB-110%, and HER-2 positive with a signals ratio of 2.97 and a copy number per cell 4.90.  (3) adjuvant radiation completed 04/01/2020.  (4) genetics testing 07/23/2019 through the STAT Breast cancer panel offered by [facility] found no deleterious mutations in ATM, BRCA1, BRCA2, CDH1, CHEK2, PALB2, PTEN, STK11 and TP53.  Additional testing through the Common Hereditary Cancers Panel offered by [facility] also found no deleterious mutations in IPC, ATM, AXIN2, BARD1, BMPR1A, BRCA1, BRCA2, BRIP1, CDH1, CDK4, CDKN2A (p14ARF), CDKN2A (p16INK4a), CHEK2, CTNNA1, DICER1, EPCAM (Deletion/duplication testing only), GREM1 (promoter region deletion/duplication testing only), KIT, MEN1, MLH1, MSH2, MSH3, MSH6, MUTYH, NBN, NF1, NHTL1, PALB2, PDGFRA, PMS2, POLD1, POLE, PTEN, RAD50, RAD51C, RAD51D, RNF43, SDHB, SDHC, SDHD, SMAD4, SMARCA4. STK11, TP53, TSC1, TSC2, and VHL.  The following genes were evaluated for sequence changes only: SDHA and HOXB13 c.251G>A variant only.   (a) 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   (5) adjuvant T-DM1/ Kadcyla started 02/18/2020, repeated Q3W x 14, completed 11/17/2020  (a) echo 02/12/2020 shows an ejection fraction in the 60-65% range  (b) echo 05/14/2020 shows an ejection fraction in the 55-60% range  (c) echo 08/10/2018 shows an ejection fraction in the 60 to 65% range  (d) switched to trastuzumab for last 3 doses secondary to side effects  (e) echo 11/11/2020 showed an ejection fraction in the 60-65% range  (6) denosumab/Prolia started 01/12/2021  (a) bone density 12/22/2020 showed a T score of -1.6  (7) postmenopausal bleeding: referral placed 06/14/2021 Last dose of denosumab Jan 2023.   PLAN:  Ms. Edda is now here for follow-up. Since her T score is only -1.6, we have discussed about omitting Prolia for  osteopenia since she is not on any antiestrogen therapy concurrently.  She has bone density coming up in May 2024. Other than this there is no change in her breasts.  She has significant architectural distortion in the left breast which remained stable.  She will repeat mammogram in June 2024, this has been ordered. With regards to motivation, I encouraged her to find a partner to go for some outdoor walks or find a volunteering position to keep her busy.  She will work on this.    Total time spent: 30 minutes  *Total Encounter Time as defined by the Centers for Medicare and Medicaid Services includes, in addition to the face-to-face time of a patient visit (documented in the note above) non-face-to-face time: obtaining and reviewing outside history, ordering and reviewing medications, tests or procedures, care coordination (communications with other health care professionals or caregivers) and documentation in the medical record.

## 2022-09-04 NOTE — Progress Notes (Unsigned)
Julie Thompson Return Visit  SUBJECTIVE  History of Present Illness: Julie Thompson is a 77 y.o. female seen in follow-up for OAB and Accidental FI. Plan at last visit was start Trospium '60mg'$  ER daily and consider adding in Psyllium and potentially start PT.   Reports it is 45$ a month which is a financial burden but that currently she is able to manage.   She reports she feels like she is about 60% better with the medication. Reports she takes it at 3pm and this is also managing her nocturia.   Has not yet started Metamucil/psyllium to assist in stool bulking.    Past Medical History: Patient  has a past medical history of Arthritis (2012), Asthma, Breast cancer ([facility]) (dx'd 07/10/19), CHF (congestive heart failure) ([facility]), Family history of basal cell carcinoma, Family history of breast cancer, Family history of colon cancer, Family history of kidney cancer, Family history of leukemia, Family history of peritoneal cancer, GERD (gastroesophageal reflux disease), H/O hiatal hernia, Hyperlipidemia, Hypothyroidism, IBS (irritable bowel syndrome), Personal history of chemotherapy, and Personal history of radiation therapy.   Past Surgical History: She  has a past surgical history that includes Tonsillectomy; Dilation and curettage of uterus; Abdominal hysterectomy; Knee arthroscopy (Left); Flexible sigmoidoscopy (N/A, 10/22/2013); Breast surgery; Portacath placement (Right, 07/29/2019); Breast lumpectomy with radioactive seed and axillary lymph node dissection (Left, 01/16/2020); Port-a-cath removal (N/A, 01/16/2020); Portacath placement (N/A, 02/13/2020); Breast lumpectomy (Left, 01/16/2020); Breast biopsy (Left, 07/09/2019); Breast biopsy (Left, 07/10/2019); and Breast biopsy (Bilateral, 07/26/2019).   Medications: She has a current medication list which includes the following prescription(s): acetaminophen, atorvastatin, calcium citrate-vitamin d, vitamin d, co-enzyme q-10,  esomeprazole, famotidine, ferrous sulfate, fiber, gabapentin, levothyroxine, loratadine, multivitamin with minerals, nabumetone, fish oil, spironolactone, trospium chloride, and [DISCONTINUED] prochlorperazine.   Allergies: Patient is allergic to penicillins and sulfa antibiotics.   Social History: Patient  reports that she has never smoked. She has never used smokeless tobacco. She reports that she does not drink alcohol and does not use drugs.      OBJECTIVE     Physical Exam: Vitals:   09/05/22 0927  BP: 123/82  Pulse: 76   Gen: No apparent distress, A&O x 3.  Detailed Urogynecologic Evaluation:  Deferred.    ASSESSMENT AND PLAN    Julie Thompson is a 77 y.o. with:  1. Overactive bladder   2. Urinary frequency   3. Incontinence of feces, unspecified fecal incontinence type    Patient wishes to continue on Trospium '60mg'$  ER at this time. She is doing well.  Declines wishing to do PT at this time.  Explained why Metamucil can be helpful in bowel leakage related to IBS with water absorption and bulking the stool. She will consider adding this on.   Patient to follow up in 6 months for medication follow up

## 2022-09-05 ENCOUNTER — Ambulatory Visit: Payer: Medicare Other | Admitting: Obstetrics and Gynecology

## 2022-09-05 ENCOUNTER — Encounter: Payer: Self-pay | Admitting: Obstetrics and Gynecology

## 2022-09-05 VITALS — BP 123/82 | HR 76

## 2022-09-05 DIAGNOSIS — R350 Frequency of micturition: Secondary | ICD-10-CM

## 2022-09-05 DIAGNOSIS — R159 Full incontinence of feces: Secondary | ICD-10-CM

## 2022-09-05 DIAGNOSIS — N3281 Overactive bladder: Secondary | ICD-10-CM | POA: Diagnosis not present

## 2022-09-05 NOTE — Patient Instructions (Signed)
Continue on Trospium '60mg'$  daily for now.   Have a happy [place]!

## 2022-09-06 ENCOUNTER — Ambulatory Visit: Payer: Medicare Other | Admitting: Obstetrics and Gynecology

## 2022-09-19 DIAGNOSIS — M25561 Pain in right knee: Secondary | ICD-10-CM | POA: Diagnosis not present

## 2022-09-20 DIAGNOSIS — E039 Hypothyroidism, unspecified: Secondary | ICD-10-CM | POA: Diagnosis not present

## 2022-09-20 DIAGNOSIS — Z1331 Encounter for screening for depression: Secondary | ICD-10-CM | POA: Diagnosis not present

## 2022-09-20 DIAGNOSIS — Z0000 Encounter for general adult medical examination without abnormal findings: Secondary | ICD-10-CM | POA: Diagnosis not present

## 2022-09-20 DIAGNOSIS — E7800 Pure hypercholesterolemia, unspecified: Secondary | ICD-10-CM | POA: Diagnosis not present

## 2022-09-20 DIAGNOSIS — I10 Essential (primary) hypertension: Secondary | ICD-10-CM | POA: Diagnosis not present

## 2022-09-20 DIAGNOSIS — C50512 Malignant neoplasm of lower-outer quadrant of left female breast: Secondary | ICD-10-CM | POA: Diagnosis not present

## 2022-09-20 DIAGNOSIS — G620 Drug-induced polyneuropathy: Secondary | ICD-10-CM | POA: Diagnosis not present

## 2022-09-20 DIAGNOSIS — C50112 Malignant neoplasm of central portion of left female breast: Secondary | ICD-10-CM | POA: Diagnosis not present

## 2022-09-29 ENCOUNTER — Ambulatory Visit: Payer: Self-pay | Admitting: Surgery

## 2022-09-29 DIAGNOSIS — C50912 Malignant neoplasm of unspecified site of left female breast: Secondary | ICD-10-CM | POA: Diagnosis not present

## 2022-09-29 NOTE — H&P (Signed)
Subjective    Chief Complaint: Follow-up       History of Present Illness: Julie Thompson is a 77 y.o. female who is seen today for breast cancer follow-up.   This is a 77 year old female who initially presented in January 2021 after screening mammogram revealed a mass in her left breast.  She underwent further work-up revealing a bilobed dumbbell shaped mass of calcifications with a total diameter of 3.7 cm.  She also had a single abnormal lymph node in the left axilla.  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  The lymph node was biopsied and revealed metastatic disease.   On 07/29/2019, she underwent ultrasound-guided port placement.  She completed neoadjuvant chemotherapy and underwent surgery on 01/16/2020.  She underwent left radioactive seed localized lumpectomy with targeted axillary dissection.  Pathology showed invasive ductal carcinoma 1.2 cm in diameter with clear margins.  The lymph node was positive with focal extranodal extension.  The tumor tested positive for HER2 which was different than the original biopsy.  She underwent Herceptin treatment followed by radiation therapy completed 04/01/2020.   Her most recent mammogram was 12/24/2021 that showed no mammographic evidence of malignancy.   Her port was removed 01/26/2021       Review of Systems: A complete review of systems was obtained from the patient.  I have reviewed this information and discussed as appropriate with the patient.  See HPI as well for other ROS.   ROS      Medical History: Past Medical History      Past Medical History:  Diagnosis Date   Arthritis     History of cancer             Patient Active Problem List  Diagnosis   Body mass index (BMI) 38.0-38.9, adult   Essential hypertension   Gastroesophageal reflux disease with esophagitis, unspecified whether hemorrhage   Malignant neoplasm of central part of female breast (CMS-HCC)    Irritable bowel syndrome   Neuropathy due to chemotherapeutic drug       Past Surgical History       Past Surgical History:  Procedure Laterality Date   MASTECTOMY PARTIAL / LUMPECTOMY Left          Allergies      Allergies  Allergen Reactions   Sulfa (Sulfonamide Antibiotics) Rash   Penicillin Rash              Current Outpatient Medications on File Prior to Visit  Medication Sig Dispense Refill   trospium (SANCTURA XR) 60 mg XR capsule Take by mouth       acetaminophen (TYLENOL) 650 MG ER tablet Take 650 mg by mouth every 8 (eight) hours as needed       aspirin 81 MG EC tablet Take 81 mg by mouth once daily       atorvastatin (LIPITOR) 20 MG tablet Take 20 mg by mouth once daily       calcium citrate-vitamin D3 (CITRACAL+D) 315 mg-5 mcg (200 unit) tablet Take 1 tablet by mouth 2 (two) times daily       cholecalciferol (VITAMIN D3) 2,000 unit tablet Take by mouth       esomeprazole (NEXIUM) 20 MG DR capsule Take by mouth       famotidine (PEPCID) 20 MG tablet Take by mouth       ferrous sulfate 324 mg (65 mg iron) EC tablet Take 324 mg by mouth daily  with breakfast       gabapentin (NEURONTIN) 100 MG capsule 3 tabs po qhs and 2 tabs in am - may titrate up to 3 tabs in am       inulin/sorbitol (FIBER CHOICE ORAL) Take 1 tablet by mouth once daily       levothyroxine (SYNTHROID) 88 MCG tablet Take by mouth       mirabegron (MYRBETRIQ) 50 mg ER tablet Take 50 mg by mouth once daily       multivitamin (MULTIPLE VITAMIN ORAL) Take 1 tablet by mouth once daily       nabumetone (RELAFEN) 750 MG tablet Take 750 mg by mouth 2 (two) times daily       spironolactone (ALDACTONE) 25 MG tablet Take by mouth       triamcinolone 0.1 % cream Apply topically        No current facility-administered medications on file prior to visit.      Family History       Family History  Problem Relation Age of Onset   High blood pressure (Hypertension) Father     Coronary Artery Disease (Blocked  arteries around heart) Father          Social History       Tobacco Use  Smoking Status Never  Smokeless Tobacco Never      Social History  Social History        Socioeconomic History   Marital status: Married  Tobacco Use   Smoking status: Never   Smokeless tobacco: Never  Vaping Use   Vaping Use: Never used  Substance and Sexual Activity   Alcohol use: Never   Drug use: Never        Objective:         Vitals:    09/29/22 1431  Temp: 36.1 C (97 F)  Weight: 86.2 kg (190 lb)  Height: 152.4 cm (5')    Body mass index is 37.11 kg/m.   Physical Exam    Well-developed well-nourished no apparent distress Right breast shows no palpable masses or lymphadenopathy, no nipple changes.   Left breast shows significant scarring at the thickening secondary radiation therapy.  The lower breast incision is severely contracted down the chest wall.  No palpable masses in the breast.  No axillary lymphadenopathy.  In the inframammary crease of the left breast, there is some erythema and maceration of the skin with some moisture.  It appears that this area never gets completely dry and has caused maceration of the skin.     Labs, Imaging and Diagnostic Testing:   CLINICAL DATA:  77 year old female presenting for annual exam. History of left breast cancer in 2021 status post lumpectomy. No new problems.   EXAM: DIGITAL DIAGNOSTIC BILATERAL MAMMOGRAM WITH TOMOSYNTHESIS AND CAD   TECHNIQUE: Bilateral digital diagnostic mammography and breast tomosynthesis was performed. The images were evaluated with computer-aided detection.   COMPARISON:  Previous exam(s).   ACR Breast Density Category b: There are scattered areas of fibroglandular density.   FINDINGS: Right breast: No suspicious mass, distortion, or microcalcifications are identified to suggest presence of malignancy.   Left breast: A spot 2D magnification view of the lumpectomy site was performed in addition to  standard views. There are stable postsurgical changes. No suspicious mass, distortion, or microcalcifications are identified to suggest presence of malignancy.   IMPRESSION: Stable benign postsurgical changes in the left breast. No mammographic evidence of malignancy bilaterally.   RECOMMENDATION: As the patient is now over  2 years out from her lumpectomy, she may return to annual screening mammography in 1 year. Given her history of breast cancer, she remains eligible for annual diagnostic mammography, if preferred.   I have discussed the findings and recommendations with the patient. If applicable, a reminder letter will be sent to the patient regarding the next appointment.   BI-RADS CATEGORY  2: Benign.     Electronically Signed   By: Audie Pinto M.D.   On: 12/23/2021 15:55     Assessment and Plan:  Diagnoses and all orders for this visit:   Invasive ductal carcinoma of breast, left (CMS-HCC)       Recommend wound exploration to excise the contracted area of the left breast in the inframammary crease.  Hopefully we can close this in such a manner that does not have as much contracture and will improve wound healing.   The surgical procedure has been discussed with the patient.  Potential risks, benefits, alternative treatments, and expected outcomes have been explained.  All of the patient's questions at this time have been answered.  The likelihood of reaching the patient's treatment goal is good.  The patient understand the proposed surgical procedure and wishes to proceed.     No follow-ups on file.   Statia Burdick Jearld Adjutant, MD    09/29/2022 2:35 PM

## 2022-11-08 ENCOUNTER — Other Ambulatory Visit: Payer: Self-pay | Admitting: Internal Medicine

## 2022-11-08 DIAGNOSIS — E2839 Other primary ovarian failure: Secondary | ICD-10-CM

## 2022-11-16 ENCOUNTER — Encounter (HOSPITAL_BASED_OUTPATIENT_CLINIC_OR_DEPARTMENT_OTHER): Payer: Self-pay | Admitting: Surgery

## 2022-11-16 ENCOUNTER — Other Ambulatory Visit: Payer: Self-pay

## 2022-11-17 ENCOUNTER — Encounter (HOSPITAL_BASED_OUTPATIENT_CLINIC_OR_DEPARTMENT_OTHER)
Admission: RE | Admit: 2022-11-17 | Discharge: 2022-11-17 | Disposition: A | Discharge status: Home / Self Care | Payer: Medicare Other | Source: Ambulatory Visit | Attending: Surgery | Admitting: Surgery

## 2022-11-17 DIAGNOSIS — I10 Essential (primary) hypertension: Secondary | ICD-10-CM | POA: Insufficient documentation

## 2022-11-17 DIAGNOSIS — Z79899 Other long term (current) drug therapy: Secondary | ICD-10-CM | POA: Diagnosis not present

## 2022-11-17 DIAGNOSIS — Z01818 Encounter for other preprocedural examination: Secondary | ICD-10-CM | POA: Diagnosis not present

## 2022-11-17 LAB — BASIC METABOLIC PANEL
Anion gap: 9 (ref 5–15)
BUN: 16 mg/dL (ref 8–23)
CO2: 26 mmol/L (ref 22–32)
Calcium: 9.4 mg/dL (ref 8.9–10.3)
Chloride: 107 mmol/L (ref 98–111)
Creatinine, Ser: 0.99 mg/dL (ref 0.44–1.00)
GFR, Estimated: 59 mL/min — ABNORMAL LOW (ref >60)
Glucose, Bld: 109 mg/dL — ABNORMAL HIGH (ref 70–99)
Potassium: 4.1 mmol/L (ref 3.5–5.1)
Sodium: 142 mmol/L (ref 135–145)

## 2022-11-17 MED ORDER — CHLORHEXIDINE GLUCONATE CLOTH 2 % EX PADS: 6.0000 | MEDICATED_PAD | Freq: Once | CUTANEOUS | Status: DC

## 2022-11-17 NOTE — Progress Notes (Signed)

## 2022-11-23 ENCOUNTER — Encounter (HOSPITAL_BASED_OUTPATIENT_CLINIC_OR_DEPARTMENT_OTHER)
Admission: RE | Disposition: A | Discharge status: Home / Self Care | Payer: Self-pay | Source: Home / Self Care | Attending: Surgery

## 2022-11-23 ENCOUNTER — Inpatient Hospital Stay
Admission: RE | Admit: 2022-11-23 | Discharge: 2022-11-23 | Disposition: A | Discharge status: Home / Self Care | Payer: Medicare Other | Source: Ambulatory Visit | Attending: Internal Medicine | Admitting: Internal Medicine

## 2022-11-23 ENCOUNTER — Encounter (HOSPITAL_BASED_OUTPATIENT_CLINIC_OR_DEPARTMENT_OTHER): Payer: Self-pay | Admitting: Surgery

## 2022-11-23 ENCOUNTER — Ambulatory Visit (HOSPITAL_BASED_OUTPATIENT_CLINIC_OR_DEPARTMENT_OTHER)
Admission: RE | Admit: 2022-11-23 | Discharge: 2022-11-23 | Disposition: A | Discharge status: Home / Self Care | Payer: Medicare Other | Attending: Surgery | Admitting: Surgery

## 2022-11-23 ENCOUNTER — Ambulatory Visit (HOSPITAL_BASED_OUTPATIENT_CLINIC_OR_DEPARTMENT_OTHER): Payer: Medicare Other | Admitting: Certified Registered"

## 2022-11-23 ENCOUNTER — Other Ambulatory Visit: Payer: Self-pay

## 2022-11-23 DIAGNOSIS — J45909 Unspecified asthma, uncomplicated: Secondary | ICD-10-CM | POA: Diagnosis not present

## 2022-11-23 DIAGNOSIS — E669 Obesity, unspecified: Secondary | ICD-10-CM | POA: Diagnosis not present

## 2022-11-23 DIAGNOSIS — I10 Essential (primary) hypertension: Secondary | ICD-10-CM

## 2022-11-23 DIAGNOSIS — L599 Disorder of the skin and subcutaneous tissue related to radiation, unspecified: Secondary | ICD-10-CM | POA: Diagnosis not present

## 2022-11-23 DIAGNOSIS — L7682 Other postprocedural complications of skin and subcutaneous tissue: Secondary | ICD-10-CM | POA: Diagnosis not present

## 2022-11-23 DIAGNOSIS — L905 Scar conditions and fibrosis of skin: Secondary | ICD-10-CM | POA: Diagnosis not present

## 2022-11-23 DIAGNOSIS — N6012 Diffuse cystic mastopathy of left breast: Secondary | ICD-10-CM | POA: Diagnosis not present

## 2022-11-23 DIAGNOSIS — Z9221 Personal history of antineoplastic chemotherapy: Secondary | ICD-10-CM | POA: Insufficient documentation

## 2022-11-23 DIAGNOSIS — N6092 Unspecified benign mammary dysplasia of left breast: Secondary | ICD-10-CM | POA: Diagnosis not present

## 2022-11-23 DIAGNOSIS — Z6839 Body mass index (BMI) 39.0-39.9, adult: Secondary | ICD-10-CM

## 2022-11-23 DIAGNOSIS — E039 Hypothyroidism, unspecified: Secondary | ICD-10-CM

## 2022-11-23 DIAGNOSIS — N6022 Fibroadenosis of left breast: Secondary | ICD-10-CM | POA: Insufficient documentation

## 2022-11-23 DIAGNOSIS — Z853 Personal history of malignant neoplasm of breast: Secondary | ICD-10-CM | POA: Insufficient documentation

## 2022-11-23 DIAGNOSIS — K219 Gastro-esophageal reflux disease without esophagitis: Secondary | ICD-10-CM | POA: Diagnosis not present

## 2022-11-23 DIAGNOSIS — Z08 Encounter for follow-up examination after completed treatment for malignant neoplasm: Secondary | ICD-10-CM | POA: Insufficient documentation

## 2022-11-23 DIAGNOSIS — Z01818 Encounter for other preprocedural examination: Secondary | ICD-10-CM

## 2022-11-23 DIAGNOSIS — Z923 Personal history of irradiation: Secondary | ICD-10-CM | POA: Insufficient documentation

## 2022-11-23 DIAGNOSIS — Z79899 Other long term (current) drug therapy: Secondary | ICD-10-CM

## 2022-11-23 DIAGNOSIS — K449 Diaphragmatic hernia without obstruction or gangrene: Secondary | ICD-10-CM | POA: Insufficient documentation

## 2022-11-23 DIAGNOSIS — S21002A Unspecified open wound of left breast, initial encounter: Secondary | ICD-10-CM | POA: Diagnosis not present

## 2022-11-23 DIAGNOSIS — N61 Mastitis without abscess: Secondary | ICD-10-CM | POA: Diagnosis not present

## 2022-11-23 DIAGNOSIS — N6489 Other specified disorders of breast: Secondary | ICD-10-CM | POA: Diagnosis not present

## 2022-11-23 HISTORY — PX: EVACUATION BREAST HEMATOMA: SHX1537

## 2022-11-23 HISTORY — DX: Other specified postprocedural states: Z98.890

## 2022-11-23 SURGERY — EVACUATION, HEMATOMA, BREAST
Anesthesia: General | Site: Breast | Laterality: Left

## 2022-11-23 MED ORDER — FENTANYL CITRATE (PF) 100 MCG/2ML IJ SOLN
25.0000 ug | INTRAMUSCULAR | Status: DC | PRN
  Administered 2022-11-23: 25 ug via INTRAVENOUS
  Administered 2022-11-23: 50 ug via INTRAVENOUS

## 2022-11-23 MED ORDER — 0.9 % SODIUM CHLORIDE (POUR BTL) OPTIME
TOPICAL | Status: DC | PRN
  Administered 2022-11-23: 500 mL

## 2022-11-23 MED ORDER — ACETAMINOPHEN 500 MG PO TABS
ORAL_TABLET | ORAL | Status: AC
  Filled 2022-11-23: qty 2

## 2022-11-23 MED ORDER — ACETAMINOPHEN 500 MG PO TABS
1000.0000 mg | ORAL_TABLET | ORAL | Status: AC
  Administered 2022-11-23: 1000 mg via ORAL

## 2022-11-23 MED ORDER — LIDOCAINE HCL (CARDIAC) PF 100 MG/5ML IV SOSY
PREFILLED_SYRINGE | INTRAVENOUS | Status: DC | PRN
  Administered 2022-11-23: 60 mg via INTRAVENOUS

## 2022-11-23 MED ORDER — AMISULPRIDE (ANTIEMETIC) 5 MG/2ML IV SOLN: 10.0000 mg | Freq: Once | INTRAVENOUS | Status: DC | PRN

## 2022-11-23 MED ORDER — LACTATED RINGERS IV SOLN: INTRAVENOUS | Status: DC

## 2022-11-23 MED ORDER — PROPOFOL 10 MG/ML IV BOLUS
INTRAVENOUS | Status: DC | PRN
  Administered 2022-11-23: 100 mg via INTRAVENOUS

## 2022-11-23 MED ORDER — PROPOFOL 500 MG/50ML IV EMUL
INTRAVENOUS | Status: DC | PRN
  Administered 2022-11-23: 25 ug/kg/min via INTRAVENOUS

## 2022-11-23 MED ORDER — LIDOCAINE 2% (20 MG/ML) 5 ML SYRINGE
INTRAMUSCULAR | Status: DC | PRN
  Administered 2022-11-23: 60 mg via INTRAVENOUS

## 2022-11-23 MED ORDER — ONDANSETRON HCL 4 MG/2ML IJ SOLN: 4.0000 mg | Freq: Once | INTRAMUSCULAR | Status: DC | PRN

## 2022-11-23 MED ORDER — CEFAZOLIN SODIUM-DEXTROSE 2-4 GM/100ML-% IV SOLN: 2.0000 g | INTRAVENOUS | Status: DC

## 2022-11-23 MED ORDER — BUPIVACAINE-EPINEPHRINE 0.25% -1:200000 IJ SOLN
INTRAMUSCULAR | Status: DC | PRN
  Administered 2022-11-23: 10 mL

## 2022-11-23 MED ORDER — FENTANYL CITRATE (PF) 100 MCG/2ML IJ SOLN
INTRAMUSCULAR | Status: AC
  Filled 2022-11-23: qty 2

## 2022-11-23 MED ORDER — CEFAZOLIN SODIUM-DEXTROSE 2-4 GM/100ML-% IV SOLN
INTRAVENOUS | Status: AC
  Filled 2022-11-23: qty 100

## 2022-11-23 MED ORDER — FENTANYL CITRATE (PF) 100 MCG/2ML IJ SOLN
INTRAMUSCULAR | Status: DC | PRN
  Administered 2022-11-23 (×2): 50 ug via INTRAVENOUS

## 2022-11-23 MED ORDER — DEXAMETHASONE SODIUM PHOSPHATE 4 MG/ML IJ SOLN
INTRAMUSCULAR | Status: DC | PRN
  Administered 2022-11-23: 4 mg via INTRAVENOUS

## 2022-11-23 MED ORDER — ONDANSETRON HCL 4 MG/2ML IJ SOLN
INTRAMUSCULAR | Status: DC | PRN
  Administered 2022-11-23: 4 mg via INTRAVENOUS

## 2022-11-23 SURGICAL SUPPLY — 49 items
APL PRP STRL LF DISP 70% ISPRP (MISCELLANEOUS) ×1
APL SKNCLS STERI-STRIP NONHPOA (GAUZE/BANDAGES/DRESSINGS) ×1
APL SWBSTK 6 STRL LF DISP (MISCELLANEOUS)
APPLICATOR COTTON TIP 6 STRL (MISCELLANEOUS) IMPLANT
APPLICATOR COTTON TIP 6IN STRL (MISCELLANEOUS)
APPLIER CLIP 9.375 MED OPEN (MISCELLANEOUS)
APR CLP MED 9.3 20 MLT OPN (MISCELLANEOUS)
BENZOIN TINCTURE PRP APPL 2/3 (GAUZE/BANDAGES/DRESSINGS) ×1 IMPLANT
BLADE HEX COATED 2.75 (ELECTRODE) ×1 IMPLANT
BLADE SURG 15 STRL LF DISP TIS (BLADE) ×1 IMPLANT
BLADE SURG 15 STRL SS (BLADE) ×1
CANISTER SUCT 1200ML W/VALVE (MISCELLANEOUS) ×1 IMPLANT
CHLORAPREP W/TINT 26 (MISCELLANEOUS) ×1 IMPLANT
CLIP APPLIE 9.375 MED OPEN (MISCELLANEOUS) IMPLANT
COVER BACK TABLE 60X90IN (DRAPES) ×1 IMPLANT
COVER MAYO STAND STRL (DRAPES) ×1 IMPLANT
DRAPE LAPAROTOMY 100X72 PEDS (DRAPES) ×1 IMPLANT
DRAPE UTILITY XL STRL (DRAPES) ×1 IMPLANT
DRSG TEGADERM 4X4.75 (GAUZE/BANDAGES/DRESSINGS) ×1 IMPLANT
ELECT REM PT RETURN 9FT ADLT (ELECTROSURGICAL) ×1
ELECTRODE REM PT RTRN 9FT ADLT (ELECTROSURGICAL) ×1 IMPLANT
GAUZE SPONGE 4X4 12PLY STRL LF (GAUZE/BANDAGES/DRESSINGS) ×1 IMPLANT
GLOVE BIO SURGEON STRL SZ7 (GLOVE) ×1 IMPLANT
GLOVE BIOGEL PI IND STRL 7.0 (GLOVE) IMPLANT
GLOVE BIOGEL PI IND STRL 7.5 (GLOVE) ×1 IMPLANT
GLOVE SURG SS PI 7.0 STRL IVOR (GLOVE) IMPLANT
GOWN STRL REUS W/ TWL LRG LVL3 (GOWN DISPOSABLE) ×2 IMPLANT
GOWN STRL REUS W/TWL LRG LVL3 (GOWN DISPOSABLE) ×3
KIT MARKER MARGIN INK (KITS) IMPLANT
NDL HYPO 25X1 1.5 SAFETY (NEEDLE) ×1 IMPLANT
NEEDLE HYPO 25X1 1.5 SAFETY (NEEDLE) ×1 IMPLANT
NS IRRIG 1000ML POUR BTL (IV SOLUTION) ×1 IMPLANT
PACK BASIN DAY SURGERY FS (CUSTOM PROCEDURE TRAY) ×1 IMPLANT
PENCIL SMOKE EVACUATOR (MISCELLANEOUS) ×1 IMPLANT
SLEEVE SCD COMPRESS KNEE MED (STOCKING) ×1 IMPLANT
SPIKE FLUID TRANSFER (MISCELLANEOUS) IMPLANT
SPONGE T-LAP 4X18 ~~LOC~~+RFID (SPONGE) ×1 IMPLANT
STRIP CLOSURE SKIN 1/2X4 (GAUZE/BANDAGES/DRESSINGS) ×1 IMPLANT
SUT CHROMIC 3 0 SH 27 (SUTURE) IMPLANT
SUT MON AB 4-0 PC3 18 (SUTURE) ×1 IMPLANT
SUT SILK 2 0 SH (SUTURE) IMPLANT
SUT VIC AB 3-0 SH 27 (SUTURE) ×1
SUT VIC AB 3-0 SH 27X BRD (SUTURE) ×1 IMPLANT
SUT VICRYL 3-0 CR8 SH (SUTURE) IMPLANT
SYR CONTROL 10ML LL (SYRINGE) ×1 IMPLANT
TOWEL GREEN STERILE FF (TOWEL DISPOSABLE) ×1 IMPLANT
TRAY FAXITRON CT DISP (TRAY / TRAY PROCEDURE) IMPLANT
TUBE CONNECTING 20X1/4 (TUBING) ×1 IMPLANT
YANKAUER SUCT BULB TIP NO VENT (SUCTIONS) ×1 IMPLANT

## 2022-11-23 NOTE — Discharge Instructions (Addendum)
[facility] Office Phone Number [phone]  BREAST BIOPSY/ PARTIAL MASTECTOMY: POST OP INSTRUCTIONS  Always review your discharge instruction sheet given to you by the facility where your surgery was performed.  IF YOU HAVE DISABILITY OR FAMILY LEAVE FORMS, YOU MUST BRING THEM TO THE OFFICE FOR PROCESSING.  DO NOT GIVE THEM TO YOUR DOCTOR.  A prescription for pain medication may be given to you upon discharge.  Take your pain medication as prescribed, if needed.  If narcotic pain medicine is not needed, then you may take acetaminophen (Tylenol) or ibuprofen (Advil) as needed. Take your usually prescribed medications unless otherwise directed If you need a refill on your pain medication, please contact your pharmacy.  They will contact our office to request authorization.  Prescriptions will not be filled after 5pm or on week-ends. You should eat very light the first 24 hours after surgery, such as soup, crackers, pudding, etc.  Resume your normal diet the day after surgery. Most patients will experience some swelling and bruising in the breast.  Ice packs and a good support bra will help.  Swelling and bruising can take several days to resolve.  It is common to experience some constipation if taking pain medication after surgery.  Increasing fluid intake and taking a stool softener will usually help or prevent this problem from occurring.  A mild laxative (Milk of Magnesia or Miralax) should be taken according to package directions if there are no bowel movements after 48 hours. Unless discharge instructions indicate otherwise, you may remove your bandages 24-48 hours after surgery, and you may shower at that time.  You may have steri-strips (small skin tapes) in place directly over the incision.  These strips should be left on the skin for 7-10 days.  If your surgeon used skin glue on the incision, you may shower in 24 hours.  The glue will flake off over the next 2-3 weeks.  Any  sutures or staples will be removed at the office during your follow-up visit. ACTIVITIES:  You may resume regular daily activities (gradually increasing) beginning the next day.  Wearing a good support bra or sports bra minimizes pain and swelling.  You may have sexual intercourse when it is comfortable. You may drive when you no longer are taking prescription pain medication, you can comfortably wear a seatbelt, and you can safely maneuver your car and apply brakes. RETURN TO WORK:  ______________________________________________________________________________________ Bonita Quin should see your doctor in the office for a follow-up appointment approximately two weeks after your surgery.  Your doctor's nurse will typically make your follow-up appointment when she calls you with your pathology report.  Expect your pathology report 2-3 business days after your surgery.  You may call to check if you do not hear from [place] after three days. OTHER INSTRUCTIONS: _______________________________________________________________________________________________ _____________________________________________________________________________________________________________________________________ _____________________________________________________________________________________________________________________________________ _____________________________________________________________________________________________________________________________________  WHEN TO CALL YOUR DOCTOR: Fever over 101.0 Nausea and/or vomiting. Extreme swelling or bruising. Continued bleeding from incision. Increased pain, redness, or drainage from the incision.  The clinic staff is available to answer your questions during regular business hours.  Please don't hesitate to call and ask to speak to one of the nurses for clinical concerns.  If you have a medical emergency, go to the nearest emergency room or call 911.  A surgeon from [facility] is always on call at the hospital.  For further questions, please visit [email or web address]    Post Anesthesia Home Care Instructions  Activity: Get plenty of rest for the remainder of  the day. A responsible individual must stay with you for 24 hours following the procedure.  For the next 24 hours, DO NOT: -Drive a car -Advertising copywriter -Drink alcoholic beverages -Take any medication unless instructed by your physician -Make any legal decisions or sign important papers.  Meals: Start with liquid foods such as gelatin or soup. Progress to regular foods as tolerated. Avoid greasy, spicy, heavy foods. If nausea and/or vomiting occur, drink only clear liquids until the nausea and/or vomiting subsides. Call your physician if vomiting continues.  Special Instructions/Symptoms: Your throat may feel dry or sore from the anesthesia or the breathing tube placed in your throat during surgery. If this causes discomfort, gargle with warm salt water. The discomfort should disappear within 24 hours.  If you had a scopolamine patch placed behind your ear for the management of post- operative nausea and/or vomiting:  1. The medication in the patch is effective for 72 hours, after which it should be removed.  Wrap patch in a tissue and discard in the trash. Wash hands thoroughly with soap and water. 2. You may remove the patch earlier than 72 hours if you experience unpleasant side effects which may include dry mouth, dizziness or visual disturbances. 3. Avoid touching the patch. Wash your hands with soap and water after contact with the patch.    May have Tylenol at 2:53

## 2022-11-23 NOTE — Transfer of Care (Signed)
Immediate Anesthesia Transfer of Care Note  Patient: Julie Thompson  Procedure(s) Performed: LEFT BREAST WOUND EXPLORATION (Left: Breast)  Patient Location: PACU  Anesthesia Type:General  Level of Consciousness: drowsy and patient cooperative  Airway & Oxygen Therapy: Patient Spontanous Breathing and Patient connected to face mask oxygen  Post-op Assessment: Report given to RN and Post -op Vital signs reviewed and stable  Post vital signs: Reviewed and stable  Last Vitals:  Vitals Value Taken Time  BP    Temp    Pulse 76 11/23/22 1035  Resp    SpO2 100 % 11/23/22 1035  Vitals shown include unvalidated device data.  Last Pain:  Vitals:   11/23/22 0851  TempSrc: Temporal  PainSc: 0-No pain         Complications: No notable events documented.

## 2022-11-23 NOTE — Anesthesia Postprocedure Evaluation (Signed)
Anesthesia Post Note  Patient: Julie Thompson  Procedure(s) Performed: LEFT BREAST WOUND EXPLORATION (Left: Breast)     Patient location during evaluation: PACU Anesthesia Type: General Level of consciousness: awake Pain management: pain level controlled Vital Signs Assessment: post-procedure vital signs reviewed and stable Respiratory status: spontaneous breathing, nonlabored ventilation and respiratory function stable Cardiovascular status: blood pressure returned to baseline and stable Postop Assessment: no apparent nausea or vomiting Anesthetic complications: no   No notable events documented.  Last Vitals:  Vitals:   11/23/22 1115 11/23/22 1128  BP: (!) 144/82 (!) 148/72  Pulse:  73  Resp:  16  Temp:  (!) 36.3 C  SpO2: 94% 96%    Last Pain:  Vitals:   11/23/22 1128  TempSrc:   PainSc: 3                  Aristeo Hankerson P Dagoberto Nealy

## 2022-11-23 NOTE — Op Note (Addendum)
Pre-op diagnosis: Chronic left breast wound Postop diagnosis: Same Procedure performed: Left breast wound exploration and scar revision Surgeon:Darol Cush K Marielis Samara Anesthesia: General via LMA Indications: This is a 77 year old female who was diagnosed with breast cancer in January 2021.  She underwent neoadjuvant chemotherapy.  In July 2021 she underwent left breast radioactive seed localized lumpectomy with a targeted axillary lymph node dissection.  She underwent subsequent radiation.  Her port has been removed.  She has developed a very tight chronic contracture in the left inframammary incision secondary to radiation.  This is because of some chronic wound issues with significant discomfort.  She presents now for revision of this area.   Description of procedure: The patient is brought to the operating room and placed in the supine position on the operating room table.  After an adequate level general anesthesia was obtained, her left breast was prepped with Betadine and draped in sterile fashion.  A timeout was taken to ensure the proper patient and proper procedure.  The lower edge of her areola is very tightly contracted down to the inframammary crease.  There is a central sinus tract that goes down to the chest wall.  I made a transverse elliptical incision to include the entire sinus tract.  This area of skin measured 4 cm x 1.5 cm.  Cautery was used to dissect down to the chest wall.  There is significant thick fibrosis of the subcutaneous tissue.  We excised the specimen and sent this for pathologic examination after oriented with a paint kit.  The patient has very thick chronic scar tissue in the lower breast.  We excised some of this tissue and sent this separately for pathology.  I then undermined the skin flaps superiorly and inferiorly.  We then closed with multiple interrupted 3-0 Vicryl deep subcutaneous sutures.  4-0 Monocryl was used to close the skin.  Benzoin and Steri-Strips were applied.   A sterile dressing is applied.  The patient was then extubated and brought to the recovery room in stable condition.  All sponge, instrument, and needle counts are correct.  Wilmon Arms. Corliss Skains, MD, [facility]  General Surgery   11/23/2022 10:37 AM

## 2022-11-23 NOTE — H&P (Signed)
Chief Complaint: Follow-up       History of Present Illness: Julie Thompson is a 77 y.o. female who is seen today for breast cancer follow-up.   This is a 77 year old female who initially presented in January 2021 after screening mammogram revealed a mass in her left breast.  She underwent further work-up revealing a bilobed dumbbell shaped mass of calcifications with a total diameter of 3.7 cm.  She also had a single abnormal lymph node in the left axilla.  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  The lymph node was biopsied and revealed metastatic disease.   On 07/29/2019, she underwent ultrasound-guided port placement.  She completed neoadjuvant chemotherapy and underwent surgery on 01/16/2020.  She underwent left radioactive seed localized lumpectomy with targeted axillary dissection.  Pathology showed invasive ductal carcinoma 1.2 cm in diameter with clear margins.  The lymph node was positive with focal extranodal extension.  The tumor tested positive for HER2 which was different than the original biopsy.  She underwent Herceptin treatment followed by radiation therapy completed 04/01/2020.   Her most recent mammogram was 12/24/2021 that showed no mammographic evidence of malignancy.   Her port was removed 01/26/2021       Review of Systems: A complete review of systems was obtained from the patient.  I have reviewed this information and discussed as appropriate with the patient.  See HPI as well for other ROS.   ROS      Medical History: Past Medical History         Past Medical History:  Diagnosis Date   Arthritis     History of cancer               Patient Active Problem List  Diagnosis   Body mass index (BMI) 38.0-38.9, adult   Essential hypertension   Gastroesophageal reflux disease with esophagitis, unspecified whether hemorrhage   Malignant neoplasm of central part of female breast (CMS-HCC)   Irritable bowel  syndrome   Neuropathy due to chemotherapeutic drug       Past Surgical History           Past Surgical History:  Procedure Laterality Date   MASTECTOMY PARTIAL / LUMPECTOMY Left          Allergies         Allergies  Allergen Reactions   Sulfa (Sulfonamide Antibiotics) Rash   Penicillin Rash                   Current Outpatient Medications on File Prior to Visit  Medication Sig Dispense Refill   trospium (SANCTURA XR) 60 mg XR capsule Take by mouth       acetaminophen (TYLENOL) 650 MG ER tablet Take 650 mg by mouth every 8 (eight) hours as needed       aspirin 81 MG EC tablet Take 81 mg by mouth once daily       atorvastatin (LIPITOR) 20 MG tablet Take 20 mg by mouth once daily       calcium citrate-vitamin D3 (CITRACAL+D) 315 mg-5 mcg (200 unit) tablet Take 1 tablet by mouth 2 (two) times daily       cholecalciferol (VITAMIN D3) 2,000 unit tablet Take by mouth       esomeprazole (NEXIUM) 20 MG DR capsule Take by mouth       famotidine (PEPCID) 20 MG tablet Take by mouth       ferrous sulfate  324 mg (65 mg iron) EC tablet Take 324 mg by mouth daily with breakfast       gabapentin (NEURONTIN) 100 MG capsule 3 tabs po qhs and 2 tabs in am - may titrate up to 3 tabs in am       inulin/sorbitol (FIBER CHOICE ORAL) Take 1 tablet by mouth once daily       levothyroxine (SYNTHROID) 88 MCG tablet Take by mouth       mirabegron (MYRBETRIQ) 50 mg ER tablet Take 50 mg by mouth once daily       multivitamin (MULTIPLE VITAMIN ORAL) Take 1 tablet by mouth once daily       nabumetone (RELAFEN) 750 MG tablet Take 750 mg by mouth 2 (two) times daily       spironolactone (ALDACTONE) 25 MG tablet Take by mouth       triamcinolone 0.1 % cream Apply topically        No current facility-administered medications on file prior to visit.      Family History           Family History  Problem Relation Age of Onset   High blood pressure (Hypertension) Father     Coronary Artery Disease (Blocked  arteries around heart) Father          Social History         Tobacco Use  Smoking Status Never  Smokeless Tobacco Never      Social History  Social History           Socioeconomic History   Marital status: Married  Tobacco Use   Smoking status: Never   Smokeless tobacco: Never  Vaping Use   Vaping Use: Never used  Substance and Sexual Activity   Alcohol use: Never   Drug use: Never        Objective:           Vitals:     Physical Exam    Well-developed well-nourished no apparent distress Right breast shows no palpable masses or lymphadenopathy, no nipple changes.   Left breast shows significant scarring at the thickening secondary radiation therapy.  The lower breast incision is severely contracted down the chest wall.  No palpable masses in the breast.  No axillary lymphadenopathy.  In the inframammary crease of the left breast, there is some erythema and maceration of the skin with some moisture.  It appears that this area never gets completely dry and has caused maceration of the skin.     Labs, Imaging and Diagnostic Testing:   CLINICAL DATA:  77 year old female presenting for annual exam. History of left breast cancer in 2021 status post lumpectomy. No new problems.   EXAM: DIGITAL DIAGNOSTIC BILATERAL MAMMOGRAM WITH TOMOSYNTHESIS AND CAD   TECHNIQUE: Bilateral digital diagnostic mammography and breast tomosynthesis was performed. The images were evaluated with computer-aided detection.   COMPARISON:  Previous exam(s).   ACR Breast Density Category b: There are scattered areas of fibroglandular density.   FINDINGS: Right breast: No suspicious mass, distortion, or microcalcifications are identified to suggest presence of malignancy.   Left breast: A spot 2D magnification view of the lumpectomy site was performed in addition to standard views. There are stable postsurgical changes. No suspicious mass, distortion, or microcalcifications are  identified to suggest presence of malignancy.   IMPRESSION: Stable benign postsurgical changes in the left breast. No mammographic evidence of malignancy bilaterally.   RECOMMENDATION: As the patient is now over 2 years out from her  lumpectomy, she may return to annual screening mammography in 1 year. Given her history of breast cancer, she remains eligible for annual diagnostic mammography, if preferred.   I have discussed the findings and recommendations with the patient. If applicable, a reminder letter will be sent to the patient regarding the next appointment.   BI-RADS CATEGORY  2: Benign.     Electronically Signed   By: Emmaline Kluver M.D.   On: 12/23/2021 15:55     Assessment and Plan:  Diagnoses and all orders for this visit:   Invasive ductal carcinoma of breast, left (CMS-HCC)       Recommend wound exploration to excise the contracted area of the left breast in the inframammary crease.  Hopefully we can close this in such a manner that does not have as much contracture and will improve wound healing.   The surgical procedure has been discussed with the patient.  Potential risks, benefits, alternative treatments, and expected outcomes have been explained.  All of the patient's questions at this time have been answered.  The likelihood of reaching the patient's treatment goal is good.  The patient understand the proposed surgical procedure and wishes to proceed.     Wilmon Arms. Corliss Skains, MD, [facility]  General Surgery   11/23/2022 7:06 AM

## 2022-11-23 NOTE — Anesthesia Procedure Notes (Signed)
Procedure Name: LMA Insertion Date/Time: 11/23/2022 9:43 AM  Performed by: Krystle Polcyn, Jewel Baize, CRNAPre-anesthesia Checklist: Patient identified, Emergency Drugs available, Suction available and Patient being monitored Patient Re-evaluated:Patient Re-evaluated prior to induction Oxygen Delivery Method: Circle system utilized Preoxygenation: Pre-oxygenation with 100% oxygen Induction Type: IV induction Ventilation: Mask ventilation without difficulty LMA: LMA inserted LMA Size: 4.0 Number of attempts: 1 Airway Equipment and Method: Bite block Placement Confirmation: positive ETCO2 Tube secured with: Tape Dental Injury: Teeth and Oropharynx as per pre-operative assessment

## 2022-11-23 NOTE — Anesthesia Preprocedure Evaluation (Addendum)
Anesthesia Evaluation  Patient identified by MRN, date of birth, ID band Patient awake    Reviewed: Allergy & Precautions, NPO status , Patient's Chart, lab work & pertinent test results  History of Anesthesia Complications (+) PONV and history of anesthetic complications  Airway Mallampati: III  TM Distance: >3 FB Neck ROM: Full    Dental no notable dental hx.    Pulmonary asthma    Pulmonary exam normal        Cardiovascular negative cardio ROS Normal cardiovascular exam     Neuro/Psych negative neurological ROS  negative psych ROS   GI/Hepatic Neg liver ROS, hiatal hernia,GERD  Medicated and Controlled,,  Endo/Other  Hypothyroidism    Renal/GU negative Renal ROS     Musculoskeletal  (+) Arthritis ,    Abdominal  (+) + obese  Peds  Hematology negative hematology ROS (+)   Anesthesia Other Findings CHRONIC LEFT BREAST WOUND  Reproductive/Obstetrics                             Anesthesia Physical Anesthesia Plan  ASA: 2  Anesthesia Plan: General   Post-op Pain Management:    Induction: Intravenous  PONV Risk Score and Plan: Ondansetron, Dexamethasone and Propofol infusion  Airway Management Planned: LMA  Additional Equipment:   Intra-op Plan:   Post-operative Plan: Extubation in OR  Informed Consent: I have reviewed the patients History and Physical, chart, labs and discussed the procedure including the risks, benefits and alternatives for the proposed anesthesia with the patient or authorized representative who has indicated his/her understanding and acceptance.     Dental advisory given  Plan Discussed with: CRNA  Anesthesia Plan Comments:        Anesthesia Quick Evaluation

## 2022-11-24 ENCOUNTER — Encounter (HOSPITAL_BASED_OUTPATIENT_CLINIC_OR_DEPARTMENT_OTHER): Payer: Self-pay | Admitting: Surgery

## 2022-12-01 LAB — SURGICAL PATHOLOGY

## 2022-12-05 DIAGNOSIS — H52223 Regular astigmatism, bilateral: Secondary | ICD-10-CM | POA: Diagnosis not present

## 2022-12-09 DIAGNOSIS — H524 Presbyopia: Secondary | ICD-10-CM | POA: Diagnosis not present

## 2022-12-22 ENCOUNTER — Encounter: Payer: Self-pay | Admitting: Hematology and Oncology

## 2022-12-26 ENCOUNTER — Ambulatory Visit
Admission: RE | Admit: 2022-12-26 | Discharge: 2022-12-26 | Disposition: A | Discharge status: Home / Self Care | Payer: Medicare Other | Source: Ambulatory Visit | Attending: Hematology and Oncology | Admitting: Hematology and Oncology

## 2022-12-26 ENCOUNTER — Ambulatory Visit: Payer: Medicare Other

## 2022-12-26 DIAGNOSIS — C50512 Malignant neoplasm of lower-outer quadrant of left female breast: Secondary | ICD-10-CM

## 2023-01-10 ENCOUNTER — Other Ambulatory Visit: Payer: Self-pay | Admitting: Hematology and Oncology

## 2023-01-19 ENCOUNTER — Other Ambulatory Visit: Payer: Self-pay | Admitting: Internal Medicine

## 2023-01-19 DIAGNOSIS — N644 Mastodynia: Secondary | ICD-10-CM

## 2023-01-20 ENCOUNTER — Ambulatory Visit: Payer: Medicare Other

## 2023-01-20 ENCOUNTER — Ambulatory Visit
Admission: RE | Admit: 2023-01-20 | Discharge: 2023-01-20 | Disposition: A | Discharge status: Home / Self Care | Payer: Medicare Other | Source: Ambulatory Visit | Attending: Internal Medicine | Admitting: Internal Medicine

## 2023-01-20 DIAGNOSIS — N644 Mastodynia: Secondary | ICD-10-CM | POA: Diagnosis not present

## 2023-01-20 DIAGNOSIS — Z853 Personal history of malignant neoplasm of breast: Secondary | ICD-10-CM | POA: Diagnosis not present

## 2023-01-23 ENCOUNTER — Ambulatory Visit
Admission: RE | Admit: 2023-01-23 | Discharge: 2023-01-23 | Disposition: A | Discharge status: Home / Self Care | Payer: Medicare Other | Source: Ambulatory Visit | Attending: Internal Medicine | Admitting: Internal Medicine

## 2023-01-23 DIAGNOSIS — N644 Mastodynia: Secondary | ICD-10-CM | POA: Diagnosis not present

## 2023-01-23 DIAGNOSIS — N6459 Other signs and symptoms in breast: Secondary | ICD-10-CM | POA: Diagnosis not present

## 2023-01-23 DIAGNOSIS — Z853 Personal history of malignant neoplasm of breast: Secondary | ICD-10-CM | POA: Diagnosis not present

## 2023-02-03 ENCOUNTER — Telehealth: Payer: Self-pay

## 2023-02-03 NOTE — Telephone Encounter (Addendum)
She wanted to let Dr Linde Gillis know she stopped taking the Augmentin because of her IBS-D and it was exacerbating the diarrhea.  She has no two weeks follow up appointment from her January 23, 2023 appointment.  The patient is aware for the need to schedule.  I sent Charlotte Sanes a message to help facilitate scheduling this appointment for possibly 02/06/2023 here at the [facility].  Patient stated understanding.

## 2023-02-15 ENCOUNTER — Other Ambulatory Visit: Payer: Self-pay | Admitting: Hematology and Oncology

## 2023-02-16 ENCOUNTER — Telehealth: Payer: Self-pay

## 2023-02-16 NOTE — Telephone Encounter (Signed)
Pt called regarding gabapentin prescription refill. This RN verified with pt that pt is still taking Gabapentin 100mg  caps 2 in morning and 3 at night. Prescription refilled.

## 2023-02-17 DIAGNOSIS — R42 Dizziness and giddiness: Secondary | ICD-10-CM | POA: Diagnosis not present

## 2023-02-17 DIAGNOSIS — R112 Nausea with vomiting, unspecified: Secondary | ICD-10-CM | POA: Diagnosis not present

## 2023-03-02 ENCOUNTER — Inpatient Hospital Stay: Payer: Medicare Other | Admitting: Hematology and Oncology

## 2023-03-02 DIAGNOSIS — H81313 Aural vertigo, bilateral: Secondary | ICD-10-CM | POA: Diagnosis not present

## 2023-03-02 DIAGNOSIS — H68009 Unspecified Eustachian salpingitis, unspecified ear: Secondary | ICD-10-CM | POA: Diagnosis not present

## 2023-03-08 ENCOUNTER — Ambulatory Visit: Payer: Medicare Other | Admitting: Obstetrics and Gynecology

## 2023-03-09 ENCOUNTER — Ambulatory Visit: Payer: Medicare Other | Admitting: Obstetrics and Gynecology

## 2023-03-16 ENCOUNTER — Telehealth: Payer: Self-pay

## 2023-03-16 NOTE — Telephone Encounter (Signed)
Called patient regarding follow up appointment that needed to be scheduled for 02/06/2023.  This would be the two week follow up at the [facility]. Patient stated "I am having no issues at this time and feel I do not need to make an appointment." I let the patient know to follow up with Provider- R. Chales Salmon, MD if any changes occur with her breasts so she can order necessary imaging. Patient stated understanding.

## 2023-03-21 ENCOUNTER — Other Ambulatory Visit: Payer: Self-pay | Admitting: Hematology and Oncology

## 2023-03-27 ENCOUNTER — Ambulatory Visit: Payer: Medicare Other | Admitting: Obstetrics and Gynecology

## 2023-04-23 ENCOUNTER — Other Ambulatory Visit: Payer: Self-pay | Admitting: Hematology and Oncology

## 2023-04-24 ENCOUNTER — Encounter: Payer: Self-pay | Admitting: Oncology

## 2023-05-05 ENCOUNTER — Other Ambulatory Visit: Payer: Self-pay | Admitting: Internal Medicine

## 2023-05-05 DIAGNOSIS — Z23 Encounter for immunization: Secondary | ICD-10-CM | POA: Diagnosis not present

## 2023-05-05 DIAGNOSIS — G43009 Migraine without aura, not intractable, without status migrainosus: Secondary | ICD-10-CM | POA: Diagnosis not present

## 2023-05-05 DIAGNOSIS — R2689 Other abnormalities of gait and mobility: Secondary | ICD-10-CM

## 2023-05-17 ENCOUNTER — Ambulatory Visit
Admission: RE | Admit: 2023-05-17 | Discharge: 2023-05-17 | Disposition: A | Discharge status: Home / Self Care | Payer: Medicare Other | Source: Ambulatory Visit | Attending: Internal Medicine | Admitting: Internal Medicine

## 2023-05-17 DIAGNOSIS — R2689 Other abnormalities of gait and mobility: Secondary | ICD-10-CM

## 2023-05-17 DIAGNOSIS — R2681 Unsteadiness on feet: Secondary | ICD-10-CM | POA: Diagnosis not present

## 2023-05-24 ENCOUNTER — Encounter: Payer: Self-pay | Admitting: Oncology

## 2023-05-24 DIAGNOSIS — C773 Secondary and unspecified malignant neoplasm of axilla and upper limb lymph nodes: Secondary | ICD-10-CM | POA: Diagnosis not present

## 2023-05-24 DIAGNOSIS — J45909 Unspecified asthma, uncomplicated: Secondary | ICD-10-CM | POA: Diagnosis not present

## 2023-05-24 NOTE — Telephone Encounter (Signed)
Telephone call  

## 2023-05-26 DIAGNOSIS — M25562 Pain in left knee: Secondary | ICD-10-CM | POA: Diagnosis not present

## 2023-05-29 ENCOUNTER — Other Ambulatory Visit: Payer: Self-pay | Admitting: Hematology and Oncology

## 2023-05-30 ENCOUNTER — Other Ambulatory Visit: Payer: Medicare Other

## 2023-05-30 ENCOUNTER — Encounter: Payer: Self-pay | Admitting: Oncology

## 2023-06-05 ENCOUNTER — Encounter: Payer: Self-pay | Admitting: Obstetrics and Gynecology

## 2023-06-05 ENCOUNTER — Ambulatory Visit: Payer: Medicare Other | Admitting: Obstetrics and Gynecology

## 2023-06-05 VITALS — BP 138/55 | HR 58

## 2023-06-05 DIAGNOSIS — R159 Full incontinence of feces: Secondary | ICD-10-CM | POA: Diagnosis not present

## 2023-06-05 DIAGNOSIS — R350 Frequency of micturition: Secondary | ICD-10-CM

## 2023-06-05 DIAGNOSIS — N3281 Overactive bladder: Secondary | ICD-10-CM | POA: Diagnosis not present

## 2023-06-05 NOTE — Progress Notes (Signed)
[facility] Return Visit  SUBJECTIVE  History of Present Illness: Julie Thompson is a 77 y.o. female seen in follow-up for OAB. Plan at last visit was continue Trospium 60mg  ER daily.  Patient reports she has been through a lot of changes in the last few months including dealing with leakage from her chest. She is also currently moving homes and has had financial changed in the last 6 months. She reports she could no longer afford her Trospium, but she started doing the print out given regarding at home exercises. She reports she has been doing self guided pelvic floor PT and that it is working well for her.      Past Medical History: Patient  has a past medical history of Arthritis (2012), Asthma, Breast cancer (HCC) (dx'd 07/10/19), CHF (congestive heart failure) (HCC), Family history of basal cell carcinoma, Family history of breast cancer, Family history of colon cancer, Family history of kidney cancer, Family history of leukemia, Family history of peritoneal cancer, GERD (gastroesophageal reflux disease), H/O hiatal hernia, Hyperlipidemia, Hypothyroidism, IBS (irritable bowel syndrome), Personal history of chemotherapy, Personal history of radiation therapy, and PONV (postoperative nausea and vomiting).   Past Surgical History: She  has a past surgical history that includes Tonsillectomy; Dilation and curettage of uterus; Abdominal hysterectomy; Knee arthroscopy (Left); Flexible sigmoidoscopy (N/A, 10/22/2013); Breast surgery; Portacath placement (Right, 07/29/2019); Breast lumpectomy with radioactive seed and axillary lymph node dissection (Left, 01/16/2020); Port-a-cath removal (N/A, 01/16/2020); Portacath placement (N/A, 02/13/2020); Breast lumpectomy (Left, 01/16/2020); Breast biopsy (Left, 07/09/2019); Breast biopsy (Left, 07/10/2019); Breast biopsy (Bilateral, 07/26/2019); and Evacuation breast hematoma (Left, 11/23/2022).   Medications: She has a current medication list  which includes the following prescription(s): acetaminophen, albuterol, atorvastatin, calcium citrate-vitamin d, vitamin d, co-enzyme q-10, esomeprazole, famotidine, fiber, gabapentin, levothyroxine, loratadine, multivitamin with minerals, nabumetone, fish oil, spironolactone, trospium chloride, and [DISCONTINUED] prochlorperazine.   Allergies: Patient is allergic to penicillins and sulfa antibiotics.   Social History: Patient  reports that she has never smoked. She has never used smokeless tobacco. She reports that she does not drink alcohol and does not use drugs.      OBJECTIVE     Physical Exam: Vitals:   06/05/23 0901  BP: (!) 138/55  Pulse: (!) 58   Gen: No apparent distress, A&O x 3.  Detailed Urogynecologic Evaluation:  Deferred.   ASSESSMENT AND PLAN    Julie Thompson is a 77 y.o. with:  1. Overactive bladder   2. Urinary frequency   3. Incontinence of feces, unspecified fecal incontinence type    Patient is doing well not on medication. She has been doing self directed pelvic floor PT.  Will plan for patient to continue on her self directed pelvic floor and if she needs further intervention to let us know. We discussed also doing formal pelvic floor PT if needed.  She reports she has been working on breathing techniques and styles and has been doing great overall and has not had any loss of bowel function.   Patient to follow up in 6 months or sooner if needed.  Selmer Dominion, NP

## 2023-06-05 NOTE — Patient Instructions (Signed)
I am glad you are doing well with the exercises.   Enjoy your baking and your kitten and be careful moving in this cold!

## 2023-06-05 NOTE — Progress Notes (Deleted)
[facility] Return Visit  SUBJECTIVE  History of Present Illness: Armanii Fietz is a 77 y.o. female seen in follow-up for OAB. Plan at last visit was ***.     Past Medical History: Patient  has a past medical history of Arthritis (2012), Asthma, Breast cancer (HCC) (dx'd 07/10/19), CHF (congestive heart failure) (HCC), Family history of basal cell carcinoma, Family history of breast cancer, Family history of colon cancer, Family history of kidney cancer, Family history of leukemia, Family history of peritoneal cancer, GERD (gastroesophageal reflux disease), H/O hiatal hernia, Hyperlipidemia, Hypothyroidism, IBS (irritable bowel syndrome), Personal history of chemotherapy, Personal history of radiation therapy, and PONV (postoperative nausea and vomiting).   Past Surgical History: She  has a past surgical history that includes Tonsillectomy; Dilation and curettage of uterus; Abdominal hysterectomy; Knee arthroscopy (Left); Flexible sigmoidoscopy (N/A, 10/22/2013); Breast surgery; Portacath placement (Right, 07/29/2019); Breast lumpectomy with radioactive seed and axillary lymph node dissection (Left, 01/16/2020); Port-a-cath removal (N/A, 01/16/2020); Portacath placement (N/A, 02/13/2020); Breast lumpectomy (Left, 01/16/2020); Breast biopsy (Left, 07/09/2019); Breast biopsy (Left, 07/10/2019); Breast biopsy (Bilateral, 07/26/2019); and Evacuation breast hematoma (Left, 11/23/2022).   Medications: She has a current medication list which includes the following prescription(s): acetaminophen, albuterol, atorvastatin, calcium citrate-vitamin d, vitamin d, co-enzyme q-10, esomeprazole, famotidine, fiber, gabapentin, levothyroxine, loratadine, multivitamin with minerals, nabumetone, fish oil, spironolactone, trospium chloride, and [DISCONTINUED] prochlorperazine.   Allergies: Patient is allergic to penicillins and sulfa antibiotics.   Social History: Patient  reports that she has never  smoked. She has never used smokeless tobacco. She reports that she does not drink alcohol and does not use drugs.      OBJECTIVE     Physical Exam: Vitals:   06/05/23 0901  BP: (!) 138/55  Pulse: (!) 58   Gen: No apparent distress, A&O x 3.  Detailed Urogynecologic Evaluation:  Deferred. Prior exam showed:      No data to display             ASSESSMENT AND PLAN    Ms. Strnad is a 77 y.o. with:  No diagnosis found.  There are no diagnoses linked to this encounter.   Selmer Dominion, NP

## 2023-06-08 ENCOUNTER — Other Ambulatory Visit: Payer: Self-pay | Admitting: Internal Medicine

## 2023-06-08 DIAGNOSIS — E2839 Other primary ovarian failure: Secondary | ICD-10-CM

## 2023-06-14 ENCOUNTER — Encounter: Payer: Self-pay | Admitting: Oncology

## 2023-06-14 ENCOUNTER — Other Ambulatory Visit: Payer: Medicare Other

## 2023-06-14 ENCOUNTER — Ambulatory Visit
Admission: RE | Admit: 2023-06-14 | Discharge: 2023-06-14 | Disposition: A | Discharge status: Home / Self Care | Payer: Medicare Other | Source: Ambulatory Visit | Attending: Internal Medicine | Admitting: Internal Medicine

## 2023-06-14 DIAGNOSIS — M8588 Other specified disorders of bone density and structure, other site: Secondary | ICD-10-CM | POA: Diagnosis not present

## 2023-06-14 DIAGNOSIS — Z90722 Acquired absence of ovaries, bilateral: Secondary | ICD-10-CM | POA: Diagnosis not present

## 2023-06-14 DIAGNOSIS — E2839 Other primary ovarian failure: Secondary | ICD-10-CM

## 2023-06-14 DIAGNOSIS — N958 Other specified menopausal and perimenopausal disorders: Secondary | ICD-10-CM | POA: Diagnosis not present

## 2023-06-27 ENCOUNTER — Other Ambulatory Visit: Payer: Self-pay | Admitting: Hematology and Oncology

## 2023-06-29 ENCOUNTER — Encounter: Payer: Self-pay | Admitting: Oncology

## 2023-07-25 ENCOUNTER — Other Ambulatory Visit: Payer: Self-pay | Admitting: Hematology and Oncology

## 2023-08-11 DIAGNOSIS — M1712 Unilateral primary osteoarthritis, left knee: Secondary | ICD-10-CM | POA: Diagnosis not present

## 2023-08-14 DIAGNOSIS — C50912 Malignant neoplasm of unspecified site of left female breast: Secondary | ICD-10-CM | POA: Diagnosis not present

## 2023-08-31 DIAGNOSIS — H66003 Acute suppurative otitis media without spontaneous rupture of ear drum, bilateral: Secondary | ICD-10-CM | POA: Diagnosis not present

## 2023-08-31 DIAGNOSIS — J22 Unspecified acute lower respiratory infection: Secondary | ICD-10-CM | POA: Diagnosis not present

## 2023-09-07 ENCOUNTER — Other Ambulatory Visit: Payer: Self-pay | Admitting: Hematology and Oncology

## 2023-09-11 DIAGNOSIS — C50912 Malignant neoplasm of unspecified site of left female breast: Secondary | ICD-10-CM | POA: Diagnosis not present

## 2023-10-04 DIAGNOSIS — M1712 Unilateral primary osteoarthritis, left knee: Secondary | ICD-10-CM | POA: Diagnosis not present

## 2023-10-11 DIAGNOSIS — M25562 Pain in left knee: Secondary | ICD-10-CM | POA: Diagnosis not present

## 2023-10-13 ENCOUNTER — Other Ambulatory Visit: Payer: Self-pay | Admitting: Hematology and Oncology

## 2023-10-16 ENCOUNTER — Encounter: Payer: Self-pay | Admitting: Oncology

## 2023-10-18 DIAGNOSIS — M1712 Unilateral primary osteoarthritis, left knee: Secondary | ICD-10-CM | POA: Diagnosis not present

## 2023-10-30 DIAGNOSIS — R0781 Pleurodynia: Secondary | ICD-10-CM | POA: Diagnosis not present

## 2023-10-30 DIAGNOSIS — S4991XA Unspecified injury of right shoulder and upper arm, initial encounter: Secondary | ICD-10-CM | POA: Diagnosis not present

## 2023-10-30 DIAGNOSIS — S0992XA Unspecified injury of nose, initial encounter: Secondary | ICD-10-CM | POA: Diagnosis not present

## 2023-10-30 DIAGNOSIS — S022XXA Fracture of nasal bones, initial encounter for closed fracture: Secondary | ICD-10-CM | POA: Diagnosis not present

## 2023-11-15 DIAGNOSIS — E039 Hypothyroidism, unspecified: Secondary | ICD-10-CM | POA: Diagnosis not present

## 2023-11-15 DIAGNOSIS — F411 Generalized anxiety disorder: Secondary | ICD-10-CM | POA: Diagnosis not present

## 2023-11-15 DIAGNOSIS — I10 Essential (primary) hypertension: Secondary | ICD-10-CM | POA: Diagnosis not present

## 2023-11-15 DIAGNOSIS — R269 Unspecified abnormalities of gait and mobility: Secondary | ICD-10-CM | POA: Diagnosis not present

## 2023-11-15 DIAGNOSIS — R42 Dizziness and giddiness: Secondary | ICD-10-CM | POA: Diagnosis not present

## 2023-11-15 DIAGNOSIS — S060X9A Concussion with loss of consciousness of unspecified duration, initial encounter: Secondary | ICD-10-CM | POA: Diagnosis not present

## 2023-11-17 ENCOUNTER — Telehealth: Payer: Self-pay | Admitting: Hematology and Oncology

## 2023-11-17 NOTE — Telephone Encounter (Signed)
 Spoke with patient confirming upcoming appointment

## 2023-11-20 ENCOUNTER — Other Ambulatory Visit: Payer: Self-pay | Admitting: Hematology and Oncology

## 2023-11-21 ENCOUNTER — Telehealth: Payer: Self-pay

## 2023-11-21 ENCOUNTER — Encounter: Payer: Self-pay | Admitting: Oncology

## 2023-11-21 NOTE — Telephone Encounter (Signed)
 Called patient and verbally confirmed appointment for 5/21

## 2023-11-22 ENCOUNTER — Inpatient Hospital Stay

## 2023-11-22 ENCOUNTER — Inpatient Hospital Stay: Attending: Hematology and Oncology | Admitting: Hematology and Oncology

## 2023-11-22 VITALS — BP 130/79 | HR 64 | Temp 97.9°F | Resp 18 | Ht 59.0 in | Wt 194.2 lb

## 2023-11-22 DIAGNOSIS — R5382 Chronic fatigue, unspecified: Secondary | ICD-10-CM | POA: Diagnosis not present

## 2023-11-22 DIAGNOSIS — I509 Heart failure, unspecified: Secondary | ICD-10-CM | POA: Insufficient documentation

## 2023-11-22 DIAGNOSIS — Z90710 Acquired absence of both cervix and uterus: Secondary | ICD-10-CM | POA: Insufficient documentation

## 2023-11-22 DIAGNOSIS — C50512 Malignant neoplasm of lower-outer quadrant of left female breast: Secondary | ICD-10-CM

## 2023-11-22 DIAGNOSIS — M85851 Other specified disorders of bone density and structure, right thigh: Secondary | ICD-10-CM | POA: Diagnosis not present

## 2023-11-22 DIAGNOSIS — Z79899 Other long term (current) drug therapy: Secondary | ICD-10-CM | POA: Diagnosis not present

## 2023-11-22 DIAGNOSIS — E039 Hypothyroidism, unspecified: Secondary | ICD-10-CM | POA: Diagnosis not present

## 2023-11-22 DIAGNOSIS — Z853 Personal history of malignant neoplasm of breast: Secondary | ICD-10-CM | POA: Insufficient documentation

## 2023-11-22 DIAGNOSIS — Z171 Estrogen receptor negative status [ER-]: Secondary | ICD-10-CM | POA: Diagnosis not present

## 2023-11-22 DIAGNOSIS — Z9221 Personal history of antineoplastic chemotherapy: Secondary | ICD-10-CM | POA: Diagnosis not present

## 2023-11-22 DIAGNOSIS — H532 Diplopia: Secondary | ICD-10-CM | POA: Insufficient documentation

## 2023-11-22 DIAGNOSIS — Z923 Personal history of irradiation: Secondary | ICD-10-CM | POA: Insufficient documentation

## 2023-11-22 LAB — CMP (CANCER CENTER ONLY)
ALT: 10 U/L (ref 0–44)
AST: 17 U/L (ref 15–41)
Albumin: 4.5 g/dL (ref 3.5–5.0)
Alkaline Phosphatase: 83 U/L (ref 38–126)
Anion gap: 8 (ref 5–15)
BUN: 18 mg/dL (ref 8–23)
CO2: 28 mmol/L (ref 22–32)
Calcium: 9.6 mg/dL (ref 8.9–10.3)
Chloride: 105 mmol/L (ref 98–111)
Creatinine: 0.97 mg/dL (ref 0.44–1.00)
GFR, Estimated: >60 mL/min (ref >60)
Glucose, Bld: 82 mg/dL (ref 70–99)
Potassium: 4.3 mmol/L (ref 3.5–5.1)
Sodium: 141 mmol/L (ref 135–145)
Total Bilirubin: 0.5 mg/dL (ref 0.0–1.2)
Total Protein: 7.5 g/dL (ref 6.5–8.1)

## 2023-11-22 LAB — CBC WITH DIFFERENTIAL/PLATELET
Abs Immature Granulocytes: 0.02 10*3/uL (ref 0.00–0.07)
Basophils Absolute: 0 10*3/uL (ref 0.0–0.1)
Basophils Relative: 0 %
Eosinophils Absolute: 0.2 10*3/uL (ref 0.0–0.5)
Eosinophils Relative: 4 %
HCT: 39.7 % (ref 36.0–46.0)
Hemoglobin: 13.3 g/dL (ref 12.0–15.0)
Immature Granulocytes: 0 %
Lymphocytes Relative: 21 %
Lymphs Abs: 1 10*3/uL (ref 0.7–4.0)
MCH: 32.1 pg (ref 26.0–34.0)
MCHC: 33.5 g/dL (ref 30.0–36.0)
MCV: 95.9 fL (ref 80.0–100.0)
Monocytes Absolute: 0.4 10*3/uL (ref 0.1–1.0)
Monocytes Relative: 8 %
Neutro Abs: 3.2 10*3/uL (ref 1.7–7.7)
Neutrophils Relative %: 67 %
Platelets: 183 10*3/uL (ref 150–400)
RBC: 4.14 MIL/uL (ref 3.87–5.11)
RDW: 13.2 % (ref 11.5–15.5)
WBC: 4.8 10*3/uL (ref 4.0–10.5)
nRBC: 0 % (ref 0.0–0.2)

## 2023-11-22 LAB — VITAMIN B12: Vitamin B-12: 523 pg/mL (ref 180–914)

## 2023-11-22 LAB — IRON AND IRON BINDING CAPACITY (CC-WL,HP ONLY)
Iron: 72 ug/dL (ref 28–170)
Saturation Ratios: 19 % (ref 10.4–31.8)
TIBC: 375 ug/dL (ref 250–450)
UIBC: 303 ug/dL (ref 148–442)

## 2023-11-22 LAB — VITAMIN D 25 HYDROXY (VIT D DEFICIENCY, FRACTURES): Vit D, 25-Hydroxy: 55.41 ng/mL (ref 30–100)

## 2023-11-22 NOTE — Progress Notes (Signed)
 [facility]  Telephone:([phone]) [phone] Fax:(336) [phone]     ID: Julie Thompson DOB: [birth year]  MR#: [id number]  [id number]#:[id number]  Patient Care Team: Arva Lathe, MD as PCP - General (Internal Medicine) Alane Hsu, RN as Oncology Nurse Navigator Auther Bo, RN as Oncology Nurse Navigator Colie Dawes, MD as Attending Physician (Radiation Oncology) Dareen Ebbing, MD as Consulting Physician (General Surgery) Dayne Even, MD as Consulting Physician (Orthopedic Surgery) Bensimhon, Rheta Celestine, MD as Consulting Physician (Cardiology) Murleen Arms, MD as Consulting Physician (Hematology and Oncology) Murleen Arms, MD  CHIEF COMPLAINT: 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  She experiences persistent fatigue despite adequate sleep, feeling tired even upon waking. She snores, which sometimes wakes her up, and has been informed by others about her snoring. Recent blood work was conducted to check cholesterol levels.  She has been experiencing double vision for some time, which affects her ability to drive as she perceives more cars than there actually are. Despite having cataract surgery in the past and wearing glasses, the double vision persists.  She has a history of breast cancer and underwent a leak repair last year of her left breast. No new bone pain, headaches, or changes in breathing. Bowel movements and urination are normal.  She is scheduled for an ultrasound of her neck to check for thyroid  issues on June 11th. She mentions that she is generally in good shape considering her age and past medical history.    Rest of the pertinent 10 point ROS reviewed and negative.  REVIEW OF SYSTEMS:    COVID 19  VACCINATION STATUS: Status post Pfizer x1, July 2021, no booster as of 08/04/2020    HISTORY OF CURRENT ILLNESS: From the original intake note:  Julie Thompson had routine screening mammography on 06/21/2019 showing a possible abnormality in the left breast. She underwent left diagnostic mammography with tomography and left breast ultrasonography at [facility] on 07/01/2019 showing: breast density category C; either 2 adjacent masses or 1 complex dumbbell-shaped mass containing calcifications in the left breast at 6 o'clock, the largest of which measures 2.1 cm; chest wall invasion not excluded; 1-2 mm group of calcifications just anterior to the mass(es); single abnormal lymph node in the left axilla; vascular calcifications in anterior left breast.  Accordingly on 07/09/2019 she proceeded to biopsy of the left breast areas in question. The pathology from this procedure ([id number]) showed:  1. Left Breast, 5:30, posterior  - invasive ductal carcinoma with necrosis, grade 3  - Prognostic indicators significant for: estrogen receptor, 0% negative and progesterone receptor, 0% negative. Proliferation marker Ki67 at 60%. HER2 negative by immunohistochemistry (0). 2. Left Breast, 5:30, posterior  - invasive ductal carcinoma with necrosis, grade 3  - ductal carcinoma in situ, intermediate grade 3. Left Axilla, lymph node (1)  - metastatic carcinoma involving a lymph node  And additional biopsy of the anterior vascular calcifications in the left breast was performed on 07/10/2019. Pathology [id number]) showed: fibrocystic changes with calcifications; no malignancy identified.  This was felt to be concordant.  The patient's subsequent history is as detailed below.   PAST MEDICAL HISTORY: Past Medical History:  Diagnosis Date   Arthritis 2012   Asthma    no problems now   Breast cancer (HCC) dx'd  07/10/19   CHF (congestive heart failure) (HCC)    Family history of basal cell carcinoma     Family history of breast cancer    Family history of colon cancer    Family history of kidney cancer    Family history of leukemia    Family history of peritoneal cancer    GERD (gastroesophageal reflux disease)    H/O hiatal hernia    Hyperlipidemia    Hypothyroidism    IBS (irritable bowel syndrome)    Personal history of chemotherapy    Personal history of radiation therapy    PONV (postoperative nausea and vomiting)    nausea    PAST SURGICAL HISTORY: Past Surgical History:  Procedure Laterality Date   ABDOMINAL HYSTERECTOMY     age 33   BREAST BIOPSY Left 07/09/2019   x3   BREAST BIOPSY Left 07/10/2019   BREAST BIOPSY Bilateral 07/26/2019   x2 on rt, 1 left   BREAST LUMPECTOMY Left 01/16/2020   BREAST LUMPECTOMY WITH RADIOACTIVE SEED AND AXILLARY LYMPH NODE DISSECTION Left 01/16/2020   Procedure: LEFT BREAST LUMPECTOMY WITH RADIOACTIVE SEED AND TARGETED AXILLARY LYMPH NODE DISSECTION;  Surgeon: Dareen Ebbing, MD;  Location: [facility];  Service: General;  Laterality: Left;  LMA   BREAST SURGERY     DILATION AND CURETTAGE OF UTERUS     2 miscarriages age 26 & 59   EVACUATION BREAST HEMATOMA Left 11/23/2022   Procedure: LEFT BREAST WOUND EXPLORATION;  Surgeon: Dareen Ebbing, MD;  Location: [facility];  Service: General;  Laterality: Left;   FLEXIBLE SIGMOIDOSCOPY N/A 10/22/2013   Procedure: FLEXIBLE SIGMOIDOSCOPY;  Surgeon: Garrett Kallman, MD;  Location: [facility];  Service: Endoscopy;  Laterality: N/A;   KNEE ARTHROSCOPY Left    PORT-A-CATH REMOVAL N/A 01/16/2020   Procedure: REMOVAL PORT-A-CATH;  Surgeon: Dareen Ebbing, MD;  Location: [facility];  Service: General;  Laterality: N/A;   PORTACATH PLACEMENT Right 07/29/2019   Procedure: INSERTION PORT-A-CATH WITH ULTRASOUND;  Surgeon: Dareen Ebbing, MD;  Location: [facility];  Service: General;  Laterality: Right;   PORTACATH PLACEMENT N/A 02/13/2020   Procedure: INSERTION PORT-A-CATH WITH  ULTRASOUND GUIDANCE;  Surgeon: Dareen Ebbing, MD;  Location: [facility];  Service: General;  Laterality: N/A;   TONSILLECTOMY     age 25    FAMILY HISTORY: Family History  Problem Relation Age of Onset   Cancer Mother 22       peritoneal cancer, death certificate lists ovarian cancer   Heart Problems Sister    Basal cell carcinoma Sister 84       a second diagnosed in her 49s   Breast cancer Maternal Aunt        dx. in her 4s   Colon cancer Paternal Uncle        dx. late 34s or early 68s   Leukemia Paternal Uncle        dx. early 76s   Kidney cancer Maternal Grandmother 26   Diabetes Paternal Grandmother    Heart Problems Paternal Grandmother   Patient's father is currently living at age 4 as of 2019-07-12. Patient's mother died from ovarian cancer at age 59. She was diagnosed at age 62. The patient reports breast cancer in a maternal aunt, diagnosed in her 107's. The patient also noted colon cancer in a paternal uncle and kidney cancer in her maternal grandmother.  She has 1 sister.   GYNECOLOGIC HISTORY:  No LMP recorded. Patient has had a  hysterectomy. Menarche: [age] years old Age at first live birth: 78 years old GX P 2 LMP age 40 (with hysterectomy) Contraceptive: never used HRT used for 10-12 years  Hysterectomy? Yes, age 54 BSO? 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  Husband Sammie Crigler "Butch" Manfred Seed Sr. is a retired Public librarian. She lives at home with husband Butch. She has two children from a prior marriage. Son Lady Pier, age 62, works as a Music therapist in [place], [place]. Son Bary Boss, age 10, works as a Production assistant, radio in [place], [place]. The patient has 4 grandchildren and 4 great-grandchildren. She attends [employer].    ADVANCED DIRECTIVES: In the absence of any documentation to the contrary, the patient's spouse is their HCPOA.    HEALTH MAINTENANCE: Social History    Tobacco Use   Smoking status: Never   Smokeless tobacco: Never  Vaping Use   Vaping status: Never Used  Substance Use Topics   Alcohol use: Never   Drug use: No     Colonoscopy: 09/2013, with CT virtual 11/2013  PAP: none on file, s/p hysterectomy  Bone density: 04/2001, -0.5   Allergies  Allergen Reactions   Penicillins Hives and Other (See Comments)    Whelps   Sulfa Antibiotics Rash    Current Outpatient Medications  Medication Sig Dispense Refill   acetaminophen  (TYLENOL ) 650 MG CR tablet Take 650 mg by mouth every 8 (eight) hours as needed for pain.     ALBUTEROL IN Inhale into the lungs.     atorvastatin (LIPITOR) 20 MG tablet Take 20 mg by mouth daily.     calcium citrate-vitamin D (CITRACAL+D) 315-200 MG-UNIT tablet Take 1 tablet by mouth 2 (two) times daily.     Cholecalciferol (VITAMIN D) 2000 UNITS tablet Take 2,000 Units by mouth daily.     co-enzyme Q-10 30 MG capsule Take 30 mg by mouth 3 (three) times daily.      esomeprazole (NEXIUM) 20 MG capsule Take 20 mg by mouth daily at 12 noon.     famotidine  (PEPCID ) 20 MG tablet Take 20 mg by mouth daily as needed for heartburn or indigestion.      FIBER PO Take 1 tablet by mouth daily.     gabapentin  (NEURONTIN ) 100 MG capsule TAKE 2 CAPSULES BY MOUTH EVERY MORNING THEN TAKE 3 CAPSULE EVERY EVENING 180 capsule 0   levothyroxine (SYNTHROID) 88 MCG tablet Take 88 mcg by mouth daily before breakfast.     loratadine  (CLARITIN ) 10 MG tablet Take 1 tablet (10 mg total) by mouth daily. 60 tablet 1   Multiple Vitamin (MULTIVITAMIN WITH MINERALS) TABS tablet Take 1 tablet by mouth daily.     nabumetone (RELAFEN) 750 MG tablet Take 1 tablet (750 mg total) by mouth 2 (two) times daily.     Omega-3 Fatty Acids (FISH OIL) 1200 MG CAPS Take 1,200 mg by mouth daily.     spironolactone  (ALDACTONE ) 25 MG tablet Take 0.5 tablets (12.5 mg total) by mouth daily. 15 tablet 3   Trospium  Chloride 60 MG CP24 Take 1 capsule (60 mg total) by  mouth daily. 30 capsule 5   No current facility-administered medications for this visit.    OBJECTIVE: White woman  Vitals:   11/22/23 1343  BP: 130/79  Pulse: 64  Resp: 18  Temp: 97.9 F (36.6 C)  SpO2: 98%     Body mass index is 39.22 kg/m.   Wt Readings from Last  3 Encounters:  11/22/23 194 lb 3.2 oz (88.1 kg)  11/23/22 193 lb 12.6 oz (87.9 kg)  09/01/22 193 lb 6.4 oz (87.7 kg)  ECOG FS:1 - Symptomatic but completely ambulatory  General appearance: Alert, oriented and in no acute distress. Breasts: Significant external architectural distortion of the left breast as seen below.  No other palpable masses or regional adenopathy  Inferior view left breast 06/14/2021, no change from the below picture on exam today.   No other palpable breast masses or regional adenopathy.  This gaping has now been closed. No new concerns LAB RESULTS:  CMP     Component Value Date/Time   NA 142 11/17/2022 1100   K 4.1 11/17/2022 1100   CL 107 11/17/2022 1100   CO2 26 11/17/2022 1100   GLUCOSE 109 (H) 11/17/2022 1100   BUN 16 11/17/2022 1100   CREATININE 0.99 11/17/2022 1100   CREATININE 0.73 03/03/2022 1309   CALCIUM 9.4 11/17/2022 1100   PROT 6.8 03/03/2022 1309   ALBUMIN 4.2 03/03/2022 1309   AST 16 03/03/2022 1309   ALT 9 03/03/2022 1309   ALKPHOS 63 03/03/2022 1309   BILITOT 0.4 03/03/2022 1309   GFRNONAA 59 (L) 11/17/2022 1100   GFRNONAA >60 03/03/2022 1309   GFRAA >60 03/30/2020 0910   GFRAA >60 07/17/2019 0823    No results found for: "TOTALPROTELP", "ALBUMINELP", "A1GS", "A2GS", "BETS", "BETA2SER", "GAMS", "MSPIKE", "SPEI"  Lab Results  Component Value Date   WBC 5.4 03/03/2022   NEUTROABS 3.7 03/03/2022   HGB 12.5 03/03/2022   HCT 37.5 03/03/2022   MCV 97.4 03/03/2022   PLT 175 03/03/2022    No results found for: "LABCA2"  No components found for: "UJWJXB147"  No results for input(s): "INR" in the last 168 hours.  No results found for: "LABCA2"  No  results found for: "WGN562"  No results found for: "CAN125"  No results found for: "CAN153"  No results found for: "CA2729"  No components found for: "HGQUANT"  No results found for: "CEA1", "CEA" / No results found for: "CEA1", "CEA"   No results found for: "AFPTUMOR"  No results found for: "CHROMOGRNA"  No results found for: "KPAFRELGTCHN", "LAMBDASER", "KAPLAMBRATIO" (kappa/lambda light chains)  No results found for: "HGBA", "HGBA2QUANT", "HGBFQUANT", "HGBSQUAN" (Hemoglobinopathy evaluation)   No results found for: "LDH"  No results found for: "IRON", "TIBC", "IRONPCTSAT" (Iron and TIBC)  No results found for: "FERRITIN"  Urinalysis    Component Value Date/Time   COLORURINE AMBER (A) 09/28/2020 1309   APPEARANCEUR CLEAR 09/28/2020 1309   LABSPEC 1.023 09/28/2020 1309   PHURINE 5.0 09/28/2020 1309   GLUCOSEU NEGATIVE 09/28/2020 1309   HGBUR NEGATIVE 09/28/2020 1309   BILIRUBINUR Negative 07/22/2022 1035   KETONESUR 5 (A) 09/28/2020 1309   PROTEINUR Positive (A) 07/22/2022 1035   PROTEINUR NEGATIVE 09/28/2020 1309   UROBILINOGEN 0.2 07/22/2022 1035   NITRITE Negative 07/22/2022 1035   NITRITE NEGATIVE 09/28/2020 1309   LEUKOCYTESUR Negative 07/22/2022 1035   LEUKOCYTESUR LARGE (A) 09/28/2020 1309    STUDIES: No results found.   ELIGIBLE FOR AVAILABLE RESEARCH PROTOCOL: no  ASSESSMENT: 78 y.o.  woman status post left breast lower outer quadrant biopsy 07/09/2019 for a clinical T2 N1, stage IIIB invasive ductal carcinoma, grade 3, triple negative, with an MIB-1 of 60%  (a) chest CT scan and bone scan 07/31/2019 showed no evidence of metastatic disease  (b) MRI biopsy of 2 additional areas in the left breast (central, 07/10/2019, and at 8:30 o'clock on 07/26/2019)  are both benign and concordant  (c) biopsy of 2 suspicious areas in the right breast on 07/26/2019 (9:00 and lower outer quadrant) were benign and concordant  (d) PET scan 11/21/2019 shows  a 1.2 cm nodule in the left breast with an SUV max of 2.7, aortic calcification, but no lung liver or bone lesions, no evidence of metastases   (1) neoadjuvant chemotherapy consisting of cyclophosphamide  and doxorubicin  in dose dense fashion x4 started 07/30/2019, completed 09/10/2019, followed by weekly carboplatin  and paclitaxel  x12 started 09/24/2019  (a) cycle 6 carboplatin /paclitaxel  delayed 2 weeks because of neutropenia: granix  added  (b) cycle 12 of carboplatin /paclitaxel  omitted secondary to persistent low counts  (2) left lumpectomy with targeted axillary lymph node dissection 01/16/2020 showed a ypTIc ypN1 invasive ductal carcinoma, grade 3  (a) a single axillary lymph node was removed  (b) repeat prognostic panel on the surgical specimen showed the tumor to be estrogen and progesterone receptor negative, MIB-110%, and HER-2 positive with a signals ratio of 2.97 and a copy number per cell 4.90.  (3) adjuvant radiation completed 04/01/2020.  (4) genetics testing 07/23/2019 through the STAT Breast cancer panel offered by [facility] found no deleterious mutations in ATM, BRCA1, BRCA2, CDH1, CHEK2, PALB2, PTEN, STK11 and TP53.  Additional testing through the Common Hereditary Cancers Panel offered by [facility] also found no deleterious mutations in IPC, ATM, AXIN2, BARD1, BMPR1A, BRCA1, BRCA2, BRIP1, CDH1, CDK4, CDKN2A (p14ARF), CDKN2A (p16INK4a), CHEK2, CTNNA1, DICER1, EPCAM (Deletion/duplication testing only), GREM1 (promoter region deletion/duplication testing only), KIT, MEN1, MLH1, MSH2, MSH3, MSH6, MUTYH, NBN, NF1, NHTL1, PALB2, PDGFRA, PMS2, POLD1, POLE, PTEN, RAD50, RAD51C, RAD51D, RNF43, SDHB, SDHC, SDHD, SMAD4, SMARCA4. STK11, TP53, TSC1, TSC2, and VHL.  The following genes were evaluated for sequence changes only: SDHA and HOXB13 c.251G>A variant only.   (a) 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   (5) adjuvant T-DM1/ Kadcyla  started 02/18/2020,  repeated Q3W x 14, completed 11/17/2020  (a) echo 02/12/2020 shows an ejection fraction in the 60-65% range  (b) echo 05/14/2020 shows an ejection fraction in the 55-60% range  (c) echo 08/10/2018 shows an ejection fraction in the 60 to 65% range  (d) switched to trastuzumab  for last 3 doses secondary to side effects  (e) echo 11/11/2020 showed an ejection fraction in the 60-65% range  (6) denosumab /Prolia  started 01/12/2021  (a) bone density 12/22/2020 showed a T score of -1.6  (7) postmenopausal bleeding: referral placed 06/14/2021 Last dose of denosumab  Jan 2023.   PLAN:  Assessment and Plan Assessment & Plan Breast cancer Breast cancer stable with no new symptoms. Previous surgical intervention successful. - Schedule mammogram for July. - Arrange mammogram appointment with imaging center.  Fatigue Chronic fatigue with unclear etiology. Differential includes anemia, hypothyroidism, vitamin B12 deficiency, and vitamin D deficiency. Recent blood work unavailable. - Order blood tests for vitamin D, iron, thyroid  function, and anemia.  Double vision Chronic double vision persists post-cataract surgery and with glasses. No new neurological symptoms. Continue to FU with PCP  Concussion Concussion resolved with no current symptoms.  Total time spent: 30 minutes  *Total Encounter Time as defined by the Centers for Medicare and Medicaid Services includes, in addition to the face-to-face time of a patient visit (documented in the note above) non-face-to-face time: obtaining and reviewing outside history, ordering and reviewing medications, tests or procedures, care coordination (communications with other health care professionals or caregivers) and documentation in the medical record.

## 2023-11-23 ENCOUNTER — Ambulatory Visit: Payer: Self-pay | Admitting: Hematology and Oncology

## 2023-11-23 ENCOUNTER — Other Ambulatory Visit: Payer: Self-pay | Admitting: *Deleted

## 2023-11-23 ENCOUNTER — Encounter: Payer: Self-pay | Admitting: Oncology

## 2023-11-23 LAB — FERRITIN: Ferritin: 64 ng/mL (ref 11–307)

## 2023-11-23 LAB — TSH: TSH: 2.83 u[IU]/mL (ref 0.350–4.500)

## 2023-11-23 MED ORDER — GABAPENTIN 100 MG PO CAPS: ORAL_CAPSULE | ORAL | 1 refills | Status: DC

## 2023-12-13 DIAGNOSIS — I6523 Occlusion and stenosis of bilateral carotid arteries: Secondary | ICD-10-CM | POA: Diagnosis not present

## 2023-12-13 DIAGNOSIS — R42 Dizziness and giddiness: Secondary | ICD-10-CM | POA: Diagnosis not present

## 2023-12-20 DIAGNOSIS — I8392 Asymptomatic varicose veins of left lower extremity: Secondary | ICD-10-CM | POA: Diagnosis not present

## 2023-12-20 DIAGNOSIS — L309 Dermatitis, unspecified: Secondary | ICD-10-CM | POA: Diagnosis not present

## 2023-12-20 DIAGNOSIS — L308 Other specified dermatitis: Secondary | ICD-10-CM | POA: Diagnosis not present

## 2023-12-27 DIAGNOSIS — E7800 Pure hypercholesterolemia, unspecified: Secondary | ICD-10-CM | POA: Diagnosis not present

## 2023-12-27 DIAGNOSIS — C50512 Malignant neoplasm of lower-outer quadrant of left female breast: Secondary | ICD-10-CM | POA: Diagnosis not present

## 2023-12-27 DIAGNOSIS — N3281 Overactive bladder: Secondary | ICD-10-CM | POA: Diagnosis not present

## 2023-12-27 DIAGNOSIS — E039 Hypothyroidism, unspecified: Secondary | ICD-10-CM | POA: Diagnosis not present

## 2023-12-27 DIAGNOSIS — Z0000 Encounter for general adult medical examination without abnormal findings: Secondary | ICD-10-CM | POA: Diagnosis not present

## 2023-12-27 DIAGNOSIS — Z1331 Encounter for screening for depression: Secondary | ICD-10-CM | POA: Diagnosis not present

## 2023-12-27 DIAGNOSIS — I776 Arteritis, unspecified: Secondary | ICD-10-CM | POA: Diagnosis not present

## 2023-12-29 ENCOUNTER — Other Ambulatory Visit: Payer: Self-pay | Admitting: Hematology and Oncology

## 2024-01-22 ENCOUNTER — Ambulatory Visit
Admission: RE | Admit: 2024-01-22 | Discharge: 2024-01-22 | Disposition: A | Discharge status: Home / Self Care | Source: Ambulatory Visit | Attending: Hematology and Oncology

## 2024-01-22 DIAGNOSIS — Z171 Estrogen receptor negative status [ER-]: Secondary | ICD-10-CM

## 2024-01-22 DIAGNOSIS — Z08 Encounter for follow-up examination after completed treatment for malignant neoplasm: Secondary | ICD-10-CM | POA: Diagnosis not present

## 2024-01-22 DIAGNOSIS — I8312 Varicose veins of left lower extremity with inflammation: Secondary | ICD-10-CM | POA: Diagnosis not present

## 2024-01-22 DIAGNOSIS — R21 Rash and other nonspecific skin eruption: Secondary | ICD-10-CM | POA: Diagnosis not present

## 2024-01-22 DIAGNOSIS — Z853 Personal history of malignant neoplasm of breast: Secondary | ICD-10-CM | POA: Diagnosis not present

## 2024-01-25 ENCOUNTER — Encounter

## 2024-03-11 ENCOUNTER — Other Ambulatory Visit: Payer: Self-pay | Admitting: *Deleted

## 2024-03-11 MED ORDER — GABAPENTIN 100 MG PO CAPS: ORAL_CAPSULE | ORAL | 1 refills | Status: DC

## 2024-05-16 ENCOUNTER — Other Ambulatory Visit: Payer: Self-pay | Admitting: Hematology and Oncology

## 2024-07-31 ENCOUNTER — Other Ambulatory Visit: Payer: Self-pay | Admitting: *Deleted

## 2024-07-31 MED ORDER — GABAPENTIN 100 MG PO CAPS: ORAL_CAPSULE | ORAL | 1 refills | Status: AC

## 2024-11-22 ENCOUNTER — Ambulatory Visit: Admitting: Hematology and Oncology
